# Patient Record
Sex: Female | Born: 1995
Health system: Southern US, Community
[De-identification: ages and names within clinical notes are randomized; demographics above are authoritative.]

## PROBLEM LIST (undated history)

## (undated) ENCOUNTER — Inpatient Hospital Stay (HOSPITAL_COMMUNITY): Payer: Self-pay

## (undated) DIAGNOSIS — F32A Depression, unspecified: Secondary | ICD-10-CM

## (undated) DIAGNOSIS — A749 Chlamydial infection, unspecified: Secondary | ICD-10-CM

## (undated) DIAGNOSIS — Z332 Encounter for elective termination of pregnancy: Secondary | ICD-10-CM

## (undated) DIAGNOSIS — A549 Gonococcal infection, unspecified: Secondary | ICD-10-CM

## (undated) DIAGNOSIS — O139 Gestational [pregnancy-induced] hypertension without significant proteinuria, unspecified trimester: Secondary | ICD-10-CM

## (undated) DIAGNOSIS — F329 Major depressive disorder, single episode, unspecified: Secondary | ICD-10-CM

## (undated) HISTORY — DX: Depression, unspecified: F32.A

## (undated) HISTORY — PX: NO PAST SURGERIES: SHX2092

---
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
## 2013-01-17 ENCOUNTER — Encounter (HOSPITAL_COMMUNITY): Payer: Self-pay | Admitting: Vascular Surgery

## 2013-01-17 ENCOUNTER — Emergency Department (HOSPITAL_COMMUNITY)
Admission: EM | Admit: 2013-01-17 | Discharge: 2013-01-17 | Disposition: A | Discharge status: Home / Self Care | Payer: Medicaid Other | Attending: Emergency Medicine | Admitting: Emergency Medicine

## 2013-01-17 DIAGNOSIS — N898 Other specified noninflammatory disorders of vagina: Secondary | ICD-10-CM | POA: Insufficient documentation

## 2013-01-17 DIAGNOSIS — Z7251 High risk heterosexual behavior: Secondary | ICD-10-CM

## 2013-01-17 DIAGNOSIS — R350 Frequency of micturition: Secondary | ICD-10-CM | POA: Insufficient documentation

## 2013-01-17 DIAGNOSIS — R109 Unspecified abdominal pain: Secondary | ICD-10-CM | POA: Insufficient documentation

## 2013-01-17 DIAGNOSIS — N760 Acute vaginitis: Secondary | ICD-10-CM

## 2013-01-17 DIAGNOSIS — Z3202 Encounter for pregnancy test, result negative: Secondary | ICD-10-CM | POA: Insufficient documentation

## 2013-01-17 LAB — WET PREP, GENITAL

## 2013-01-17 LAB — URINALYSIS, ROUTINE W REFLEX MICROSCOPIC
Glucose, UA: NEGATIVE mg/dL
Hgb urine dipstick: NEGATIVE
Ketones, ur: 15 mg/dL — AB
pH: 6 (ref 5.0–8.0)

## 2013-01-17 LAB — URINE MICROSCOPIC-ADD ON

## 2013-01-17 MED ORDER — METRONIDAZOLE 500 MG PO TABS: 500.0000 mg | ORAL_TABLET | Freq: Two times a day (BID) | ORAL | Status: DC

## 2013-01-17 MED ORDER — CEFTRIAXONE SODIUM 250 MG IJ SOLR
250.0000 mg | Freq: Once | INTRAMUSCULAR | Status: AC
  Administered 2013-01-17: 250 mg via INTRAMUSCULAR
  Filled 2013-01-17: qty 250

## 2013-01-17 MED ORDER — LIDOCAINE HCL (PF) 1 % IJ SOLN
INTRAMUSCULAR | Status: AC
  Administered 2013-01-17: 0.9 mL
  Filled 2013-01-17: qty 5

## 2013-01-17 MED ORDER — DOXYCYCLINE HYCLATE 100 MG PO CAPS: 100.0000 mg | ORAL_CAPSULE | Freq: Two times a day (BID) | ORAL | Status: DC

## 2013-01-17 NOTE — ED Notes (Signed)
States had unprotected sex Friday-- now having green discharge. LMP 2/7-- abd cramping for past 6 days

## 2013-01-17 NOTE — ED Provider Notes (Signed)
History     CSN: [id number]  Arrival date & time 01/17/13  1005   First MD Initiated Contact with Patient 01/17/13 1110      Chief Complaint  Patient presents with  . Exposure to STD    (Consider location/radiation/quality/duration/timing/severity/associated sxs/prior treatment) The history is provided by the patient.   patient has had some green vaginal discharge and cramping lower abdominal pain. No fevers. She states she has had some urinary frequency. The discharge began on Friday and she had unprotected sex on Friday. She states she is late on her period. No fevers. No nausea vomiting diarrhea.  Past Medical History  Diagnosis Date  . No pertinent past medical history     Past Surgical History  Procedure Laterality Date  . No past surgeries      Family History  Problem Relation Age of Onset  . Diabetes Maternal Uncle   . Cancer Maternal Grandmother     pancreatic    History  Substance Use Topics  . Smoking status: Never Smoker   . Smokeless tobacco: Never Used  . Alcohol Use: No    OB History   Grav Para Term Preterm Abortions TAB SAB Ect Mult Living   1               Review of Systems  Constitutional: Negative for activity change and appetite change.  Respiratory: Negative for chest tightness and shortness of breath.   Cardiovascular: Negative for chest pain and leg swelling.  Gastrointestinal: Positive for abdominal pain. Negative for nausea, vomiting and diarrhea.  Genitourinary: Positive for frequency and vaginal discharge. Negative for flank pain and vaginal bleeding.  Musculoskeletal: Negative for back pain.  Skin: Negative for rash.  Neurological: Negative for weakness, numbness and headaches.  Psychiatric/Behavioral: Negative for behavioral problems.    Allergies  Review of patient's allergies indicates no known allergies.  Home Medications   Current Outpatient Rx  Name  Route  Sig  Dispense  Refill  . hydrocortisone cream 1 %    Topical   Apply 1 application topically 2 (two) times daily.         Marland Kitchen OVER THE COUNTER MEDICATION   Oral   Take 1 tablet by mouth daily. Allergy medication         . doxycycline (VIBRAMYCIN) 100 MG capsule   Oral   Take 1 capsule (100 mg total) by mouth 2 (two) times daily.   14 capsule   0   . metroNIDAZOLE (FLAGYL) 500 MG tablet   Oral   Take 1 tablet (500 mg total) by mouth 2 (two) times daily.   14 tablet   0     BP 109/63  Pulse 82  Temp(Src) 98.5 F (36.9 C) (Oral)  Resp 16  SpO2 96%  LMP 12/15/2012  Physical Exam  Nursing note and vitals reviewed. Constitutional: She is oriented to person, place, and time. She appears well-developed and well-nourished.  Cardiovascular: Normal rate and regular rhythm.   No murmur heard. Pulmonary/Chest: Effort normal and breath sounds normal. No respiratory distress. She has no wheezes. She has no rales.  Abdominal: Soft. Bowel sounds are normal. She exhibits no distension. There is no tenderness. There is no rebound and no guarding.  Musculoskeletal: Normal range of motion.  Neurological: She is alert and oriented to person, place, and time. No cranial nerve deficit.  Skin: Skin is warm and dry.  Psychiatric: She has a normal mood and affect. Her speech is normal.   pelvic  exam shows no cervical motion tenderness. No adnexal tenderness. Mild white vaginal discharge.  ED Course  Procedures (including critical care time)  Labs Reviewed  WET PREP, GENITAL - Abnormal; Notable for the following:    Clue Cells Wet Prep HPF POC FEW (*)    WBC, Wet Prep HPF POC FEW (*)    All other components within normal limits  GC/CHLAMYDIA PROBE AMP  PREGNANCY, URINE  URINALYSIS, ROUTINE W REFLEX MICROSCOPIC   No results found.   1. Unprotected sex   2. Bacterial vaginosis       MDM  Patient had unprotected sex and is some lower abdominal pain. Wet prep shows mild bacterial vaginosis. She is not pregnant. She be treated for STDs  and beefy be discharged home.        Juliet Rude. Rubin Payor, MD 01/17/13 1257

## 2013-01-18 LAB — URINE CULTURE: Colony Count: 30000

## 2013-01-19 LAB — GC/CHLAMYDIA PROBE AMP: GC Probe RNA: NEGATIVE

## 2013-01-20 ENCOUNTER — Telehealth (HOSPITAL_COMMUNITY): Payer: Self-pay | Admitting: Emergency Medicine

## 2013-01-20 NOTE — ED Notes (Signed)
+  Chlamydia. Patient treated with Doxycycline and Rocephin. Per protocol MD. [facility] faxed.

## 2013-01-20 NOTE — ED Notes (Signed)
Patient has +Chlamydia. Checking to see if appropriately treated. °

## 2013-01-21 ENCOUNTER — Telehealth (HOSPITAL_COMMUNITY): Payer: Self-pay | Admitting: Emergency Medicine

## 2013-01-21 NOTE — ED Notes (Signed)
Unable to contact patient via phone. Sent letter. °

## 2013-01-23 ENCOUNTER — Telehealth (HOSPITAL_COMMUNITY): Payer: Self-pay | Admitting: Emergency Medicine

## 2013-02-11 ENCOUNTER — Emergency Department (HOSPITAL_COMMUNITY)
Admission: EM | Admit: 2013-02-11 | Discharge: 2013-02-11 | Disposition: A | Discharge status: Home / Self Care | Payer: Medicaid Other | Attending: Emergency Medicine | Admitting: Emergency Medicine

## 2013-02-11 ENCOUNTER — Encounter (HOSPITAL_COMMUNITY): Payer: Self-pay | Admitting: Physical Medicine and Rehabilitation

## 2013-02-11 DIAGNOSIS — N898 Other specified noninflammatory disorders of vagina: Secondary | ICD-10-CM

## 2013-02-11 DIAGNOSIS — A749 Chlamydial infection, unspecified: Secondary | ICD-10-CM

## 2013-02-11 DIAGNOSIS — L293 Anogenital pruritus, unspecified: Secondary | ICD-10-CM | POA: Insufficient documentation

## 2013-02-11 MED ORDER — AZITHROMYCIN 250 MG PO TABS
1000.0000 mg | ORAL_TABLET | Freq: Once | ORAL | Status: AC
  Administered 2013-02-11: 1000 mg via ORAL
  Filled 2013-02-11: qty 4

## 2013-02-11 MED ORDER — LIDOCAINE HCL (PF) 1 % IJ SOLN
INTRAMUSCULAR | Status: AC
  Administered 2013-02-11: 5 mL
  Filled 2013-02-11: qty 5

## 2013-02-11 MED ORDER — CEFTRIAXONE SODIUM 250 MG IJ SOLR
250.0000 mg | Freq: Once | INTRAMUSCULAR | Status: AC
  Administered 2013-02-11: 250 mg via INTRAMUSCULAR
  Filled 2013-02-11: qty 250

## 2013-02-11 MED ORDER — METRONIDAZOLE 500 MG PO TABS
2000.0000 mg | ORAL_TABLET | Freq: Once | ORAL | Status: AC
  Administered 2013-02-11: 2000 mg via ORAL
  Filled 2013-02-11: qty 4

## 2013-02-11 NOTE — ED Provider Notes (Signed)
History     CSN: [id number]  Arrival date & time 02/11/13  1825   First MD Initiated Contact with Patient 02/11/13 1831      Chief Complaint  Patient presents with  . SEXUALLY TRANSMITTED DISEASE    (Consider location/radiation/quality/duration/timing/severity/associated sxs/prior treatment) The history is provided by the patient and medical records. No language interpreter was used.    Terry [name] is a 17 y.o. female  with a hx of STDs presents to the Emergency Department complaining of gradual, persistent, progressively worsening vaginal discharge onset 4 days ago.  Patient states she's been "messing with this same boy" for approximately one year but that he has also been seeing other women. Review shows that the patient was seen on 01/20/2013 J. positive Chlamydia and appears as if she was treated for this. Patient states her symptoms and discharge are the same as when she was seen on the 15th. Associated symptoms include vaginal discharge and itching.  Nothing makes it better and nothing makes it worse.  Pt denies fever, chills, headache, neck pain, chest pain, shortness of breath, abdominal pain, nausea, vomiting, diarrhea, weakness, dizziness, syncope, dysuria, hematuria.  LMP was last week and normal; pt does not use birth control.   Past Medical History  Diagnosis Date  . No pertinent past medical history     Past Surgical History  Procedure Laterality Date  . No past surgeries      Family History  Problem Relation Age of Onset  . Diabetes Maternal Uncle   . Cancer Maternal Grandmother     pancreatic    History  Substance Use Topics  . Smoking status: Never Smoker   . Smokeless tobacco: Never Used  . Alcohol Use: No    OB History   Grav Para Term Preterm Abortions TAB SAB Ect Mult Living   1               Review of Systems  Constitutional: Negative for fever, diaphoresis, appetite change, fatigue and unexpected weight change.  HENT: Negative for mouth  sores and neck stiffness.   Eyes: Negative for visual disturbance.  Respiratory: Negative for cough, chest tightness, shortness of breath and wheezing.   Cardiovascular: Negative for chest pain.  Gastrointestinal: Negative for nausea, vomiting, abdominal pain, diarrhea and constipation.  Endocrine: Negative for polydipsia, polyphagia and polyuria.  Genitourinary: Positive for vaginal discharge. Negative for dysuria, urgency, frequency and hematuria.  Musculoskeletal: Negative for back pain.  Skin: Negative for rash.  Allergic/Immunologic: Negative for immunocompromised state.  Neurological: Negative for syncope, light-headedness and headaches.  Hematological: Does not bruise/bleed easily.  Psychiatric/Behavioral: Negative for sleep disturbance. The patient is not nervous/anxious.     Allergies  Review of patient's allergies indicates no known allergies.  Home Medications  No current outpatient prescriptions on file.  BP 113/75  Pulse 70  Temp(Src) 98.1 F (36.7 C) (Oral)  Resp 16  SpO2 100%  LMP 12/15/2012  Physical Exam  Nursing note and vitals reviewed. Constitutional: She is oriented to person, place, and time. She appears well-developed and well-nourished. No distress.  HENT:  Head: Normocephalic and atraumatic.  Mouth/Throat: Oropharynx is clear and moist. No oropharyngeal exudate.  Eyes: Conjunctivae are normal. No scleral icterus.  Neck: Normal range of motion. Neck supple.  Cardiovascular: Normal rate, regular rhythm, normal heart sounds and intact distal pulses.  Exam reveals no gallop and no friction rub.   No murmur heard. Pulmonary/Chest: Effort normal and breath sounds normal. No respiratory distress. She has no  wheezes. She has no rales. She exhibits no tenderness.  Abdominal: Soft. Normal appearance and bowel sounds are normal. She exhibits no distension and no mass. There is no tenderness. There is no rigidity, no rebound, no guarding, no CVA tenderness, no  tenderness at McBurney's point and negative Murphy's sign.  Musculoskeletal: Normal range of motion. She exhibits no edema and no tenderness.  Lymphadenopathy:    She has no cervical adenopathy.  Neurological: She is alert and oriented to person, place, and time. She exhibits normal muscle tone. Coordination normal.  Speech is clear and goal oriented Moves extremities without ataxia  Skin: Skin is warm and dry. No rash noted. She is not diaphoretic. No erythema.  Psychiatric: She has a normal mood and affect.    ED Course  Procedures (including critical care time)  Labs Reviewed - No data to display No results found.   1. Chlamydia   2. Vaginal discharge       MDM  Terry [name] presents with vaginal itching and discharge, symptoms similar to the last time. This record review it appears the patient was positive for Chlamydia and untreated. Will treat patient prophylactically today.  I do not believe a repeat pelvic exam will be beneficial as none of her symptoms have changed and she was not fully treated last time.  Patient to be discharged with instructions to follow up with OBGYN. Discussed importance of using protection when sexually active. Pt understands that she was positive for Chlamydia and she will need to inform all sexual partners. Pt has been treated prophylacticly with azithromycin and rocephin due to pts history and record review. Pt not concerning for PID because hemodynamically stable, afebrile, non-tachycardic and without abdominal pain. Pt has also been treated with flagyl for Bacterial Vaginosis. Pt has been advised to not drink alcohol while on this medication.           Dahlia Client Maansi Wike, PA-C 02/11/13 1859

## 2013-02-11 NOTE — ED Notes (Signed)
Last sexual encounter was last Friday

## 2013-02-11 NOTE — ED Notes (Signed)
Pt presents to department for evaluation of vaginal itching and possible STD exposure. Pt states vaginal itching since Friday. States recent unprotected sex, thinks partner could have STD. Pt is conscious alert and oriented x4.

## 2013-02-12 NOTE — ED Provider Notes (Signed)
Medical screening examination/treatment/procedure(s) were performed by non-physician practitioner and as supervising physician I was immediately available for consultation/collaboration.    Vida Roller, MD 02/12/13 [phone]

## 2013-03-15 ENCOUNTER — Emergency Department (HOSPITAL_COMMUNITY)
Admission: EM | Admit: 2013-03-15 | Discharge: 2013-03-15 | Disposition: A | Discharge status: Home / Self Care | Payer: Medicaid Other | Attending: Emergency Medicine | Admitting: Emergency Medicine

## 2013-03-15 ENCOUNTER — Emergency Department (HOSPITAL_COMMUNITY): Payer: Medicaid Other

## 2013-03-15 ENCOUNTER — Encounter (HOSPITAL_COMMUNITY): Payer: Self-pay | Admitting: *Deleted

## 2013-03-15 DIAGNOSIS — R519 Headache, unspecified: Secondary | ICD-10-CM

## 2013-03-15 DIAGNOSIS — S0993XA Unspecified injury of face, initial encounter: Secondary | ICD-10-CM | POA: Insufficient documentation

## 2013-03-15 DIAGNOSIS — S0990XA Unspecified injury of head, initial encounter: Secondary | ICD-10-CM | POA: Insufficient documentation

## 2013-03-15 DIAGNOSIS — S199XXA Unspecified injury of neck, initial encounter: Secondary | ICD-10-CM | POA: Insufficient documentation

## 2013-03-15 MED ORDER — IBUPROFEN 800 MG PO TABS: 800.0000 mg | ORAL_TABLET | Freq: Three times a day (TID) | ORAL | Status: DC
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
## 2013-03-15 NOTE — ED Notes (Signed)
PA at bedside assessing patient.

## 2013-03-15 NOTE — ED Notes (Signed)
Patient transported to CT 
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
## 2013-03-17 NOTE — ED Provider Notes (Signed)
  Medical screening examination/treatment/procedure(s) were performed by non-physician practitioner and as supervising physician I was immediately available for consultation/collaboration.    Anothy Bufano, MD 03/17/13 2338 

## 2013-06-13 ENCOUNTER — Encounter (HOSPITAL_COMMUNITY): Payer: Self-pay | Admitting: Emergency Medicine

## 2013-06-13 ENCOUNTER — Emergency Department (HOSPITAL_COMMUNITY)
Admission: EM | Admit: 2013-06-13 | Discharge: 2013-06-13 | Disposition: A | Discharge status: Home / Self Care | Payer: Medicaid Other | Attending: Emergency Medicine | Admitting: Emergency Medicine

## 2013-06-13 DIAGNOSIS — R109 Unspecified abdominal pain: Secondary | ICD-10-CM | POA: Insufficient documentation

## 2013-06-13 DIAGNOSIS — Z3202 Encounter for pregnancy test, result negative: Secondary | ICD-10-CM | POA: Insufficient documentation

## 2013-06-13 DIAGNOSIS — N760 Acute vaginitis: Secondary | ICD-10-CM | POA: Insufficient documentation

## 2013-06-13 LAB — URINALYSIS, ROUTINE W REFLEX MICROSCOPIC
Hgb urine dipstick: NEGATIVE
Ketones, ur: NEGATIVE mg/dL
Leukocytes, UA: NEGATIVE
Protein, ur: NEGATIVE mg/dL
Urobilinogen, UA: 1 mg/dL (ref 0.0–1.0)

## 2013-06-13 LAB — POCT PREGNANCY, URINE: Preg Test, Ur: NEGATIVE

## 2013-06-13 LAB — WET PREP, GENITAL: Trich, Wet Prep: NONE SEEN

## 2013-06-13 MED ORDER — METRONIDAZOLE 500 MG PO TABS: 500.0000 mg | ORAL_TABLET | Freq: Two times a day (BID) | ORAL | Status: AC

## 2013-06-13 NOTE — ED Provider Notes (Signed)
CSN: [id number]     Arrival date & time 06/13/13  1515 History     First MD Initiated Contact with Patient 06/13/13 1728     Chief Complaint  Patient presents with  . Abdominal Pain  . Vaginal Discharge   (Consider location/radiation/quality/duration/timing/severity/associated sxs/prior Treatment) Patient is a 17 y.o. female presenting with abdominal pain and vaginal discharge. The history is provided by the patient.  Abdominal Pain Pain location:  Suprapubic Pain quality: aching   Pain radiates to:  Does not radiate Pain severity:  Mild Onset quality:  Gradual Duration:  3 days Timing:  Constant Progression:  Unchanged Chronicity:  New Context: not awakening from sleep, not recent illness and not trauma   Relieved by:  None tried Worsened by:  Nothing tried Ineffective treatments:  None tried Associated symptoms: vaginal discharge   Associated symptoms: no anorexia, no chest pain, no chills, no constipation, no cough, no diarrhea, no dysuria, no fatigue, no fever, no hematochezia, no hematuria, no melena, no nausea, no shortness of breath, no vaginal bleeding and no vomiting   Vaginal discharge:    Quality:  Malodorous, yellow and thick   Onset quality:  Gradual   Duration:  3 days   Timing:  Constant   Progression:  Unchanged   Chronicity:  New Vaginal Discharge Associated symptoms: abdominal pain   Associated symptoms: no dysuria, no fever, no nausea and no vomiting     Past Medical History  Diagnosis Date  . No pertinent past medical history    Past Surgical History  Procedure Laterality Date  . No past surgeries     Family History  Problem Relation Age of Onset  . Diabetes Maternal Uncle   . Cancer Maternal Grandmother     pancreatic   History  Substance Use Topics  . Smoking status: Never Smoker   . Smokeless tobacco: Never Used  . Alcohol Use: No   OB History   Grav Para Term Preterm Abortions TAB SAB Ect Mult Living   1              Review of  Systems  Constitutional: Negative for fever, chills, diaphoresis, appetite change and fatigue.  Eyes: Negative for photophobia and visual disturbance.  Respiratory: Negative for cough, chest tightness, shortness of breath and wheezing.   Cardiovascular: Negative for chest pain.  Gastrointestinal: Positive for abdominal pain. Negative for nausea, vomiting, diarrhea, constipation, melena, hematochezia, abdominal distention and anorexia.  Genitourinary: Positive for vaginal discharge. Negative for dysuria, urgency, hematuria, flank pain, decreased urine volume, vaginal bleeding, difficulty urinating, vaginal pain and pelvic pain.  Skin: Negative for color change, pallor and rash.  Neurological: Negative for dizziness, syncope, weakness, light-headedness, numbness and headaches.  All other systems reviewed and are negative.    Allergies  Review of patient's allergies indicates no known allergies.  Home Medications   Current Outpatient Rx  Name  Route  Sig  Dispense  Refill  . metroNIDAZOLE (FLAGYL) 500 MG tablet   Oral   Take 1 tablet (500 mg total) by mouth 2 (two) times daily.   14 tablet   0    BP 108/74  Pulse 65  Temp(Src) 98.4 F (36.9 C) (Oral)  Resp 20  SpO2 100% Physical Exam  Nursing note and vitals reviewed. Constitutional: She is oriented to person, place, and time. She appears well-developed and well-nourished. No distress.  HENT:  Head: Normocephalic and atraumatic.  Eyes: EOM are normal. Pupils are equal, round, and reactive to light.  Neck: Normal range of motion. Neck supple.  Cardiovascular: Normal rate, normal heart sounds and intact distal pulses.   Pulmonary/Chest: Effort normal and breath sounds normal. No respiratory distress. She has no wheezes. She has no rales.  Abdominal: Soft. Normal appearance and bowel sounds are normal. She exhibits no distension and no mass. There is tenderness in the suprapubic area. There is no rigidity, no rebound, no guarding,  no tenderness at McBurney's point and negative Murphy's sign.  Genitourinary: There is no rash, tenderness, lesion or injury on the right labia. There is no rash, tenderness, lesion or injury on the left labia. Uterus is not enlarged and not tender. Cervix exhibits no motion tenderness, no discharge and no friability. Right adnexum displays no mass, no tenderness and no fullness. Left adnexum displays no mass, no tenderness and no fullness. No erythema, tenderness or bleeding around the vagina. No signs of injury around the vagina. Vaginal discharge found.  Musculoskeletal: Normal range of motion. She exhibits no edema and no tenderness.  Neurological: She is alert and oriented to person, place, and time. She exhibits normal muscle tone. Coordination normal.  Skin: Skin is warm and dry. She is not diaphoretic.    ED Course   Procedures (including critical care time)  Labs Reviewed  WET PREP, GENITAL - Abnormal; Notable for the following:    Clue Cells Wet Prep HPF POC MANY (*)    WBC, Wet Prep HPF POC FEW (*)    All other components within normal limits  URINALYSIS, ROUTINE W REFLEX MICROSCOPIC - Abnormal; Notable for the following:    APPearance CLOUDY (*)    All other components within normal limits  GC/CHLAMYDIA PROBE AMP  POCT PREGNANCY, URINE   No results found. 1. BV (bacterial vaginosis)     MDM  16 y.o. F presenting with vaginal discharge and suprapubic abdominal pain.  Symptoms began 3 days ago. Pt reports yellow-brown discharge with foul odor.  Pt reports LMP 1 week ago, she has had normal monthly menstrual cycle.  Last sexual encounter 1 month ago, pt denies using protection.  She has prior h/o gonorrhea and recently treated for BV.  Pt stable on arrival, in NAD.  Exam with mild suprapubic TTP, no rebound or guarding.  Pelvic exam with thick white vaginal discharge;  No CMT, cervix normal in appearance.  No ovarian tenderness or fullness.  Wet prep and GC/chlamydia obtained  with U/A and UPT.  Wet prep positive for clue cells.  Exam not consistent with PID. U/a negative for UTI, UPT negative. Pt prescribed flagyl, advised f/u with PCP.  No further questions, stable at discharge.  Discussed with attending Dr. Micheline Maze.  Jodean Lima, MD 06/14/13 [phone]

## 2013-06-13 NOTE — ED Notes (Signed)
Pt in gown. Pelvic set up at bedside.

## 2013-06-13 NOTE — ED Notes (Signed)
Pt c/o LLQ pain and vaginal odor x 4 days; pt sts LMP was 4 days ago

## 2013-06-14 NOTE — ED Provider Notes (Signed)
Medical screening examination/treatment/procedure(s) were conducted as a shared visit with resident-physician practitioner(s) and myself.  I personally evaluated the patient during the encounter.  Pt is a 17 y.o. female with pmhx as above presenting with mild suprapubic pain & vaginal d/c.  Pt well-appearing on exam with benign abdomen.  Pt found to have BV to be tx'd as outpt.  GC/Chlam pending.    Shanna Cisco, MD 06/14/13 1308

## 2013-08-02 ENCOUNTER — Emergency Department (HOSPITAL_COMMUNITY)
Admission: EM | Admit: 2013-08-02 | Discharge: 2013-08-02 | Disposition: A | Discharge status: Home / Self Care | Payer: Medicaid Other | Attending: Emergency Medicine | Admitting: Emergency Medicine

## 2013-08-02 ENCOUNTER — Encounter (HOSPITAL_COMMUNITY): Payer: Self-pay | Admitting: Emergency Medicine

## 2013-08-02 DIAGNOSIS — N760 Acute vaginitis: Secondary | ICD-10-CM | POA: Insufficient documentation

## 2013-08-02 DIAGNOSIS — B9689 Other specified bacterial agents as the cause of diseases classified elsewhere: Secondary | ICD-10-CM

## 2013-08-02 DIAGNOSIS — Z3202 Encounter for pregnancy test, result negative: Secondary | ICD-10-CM | POA: Insufficient documentation

## 2013-08-02 DIAGNOSIS — N390 Urinary tract infection, site not specified: Secondary | ICD-10-CM | POA: Insufficient documentation

## 2013-08-02 DIAGNOSIS — N949 Unspecified condition associated with female genital organs and menstrual cycle: Secondary | ICD-10-CM | POA: Insufficient documentation

## 2013-08-02 LAB — WET PREP, GENITAL
Clue Cells Wet Prep HPF POC: NONE SEEN
Trich, Wet Prep: NONE SEEN
WBC, Wet Prep HPF POC: NONE SEEN
Yeast Wet Prep HPF POC: NONE SEEN

## 2013-08-02 LAB — POCT PREGNANCY, URINE: Preg Test, Ur: NEGATIVE

## 2013-08-02 LAB — URINALYSIS, ROUTINE W REFLEX MICROSCOPIC
Glucose, UA: NEGATIVE mg/dL
Ketones, ur: NEGATIVE mg/dL
Nitrite: NEGATIVE
Protein, ur: 100 mg/dL — AB

## 2013-08-02 LAB — URINE MICROSCOPIC-ADD ON

## 2013-08-02 MED ORDER — CEPHALEXIN 500 MG PO CAPS: 500.0000 mg | ORAL_CAPSULE | Freq: Four times a day (QID) | ORAL | Status: DC

## 2013-08-02 MED ORDER — METRONIDAZOLE 500 MG PO TABS: 500.0000 mg | ORAL_TABLET | Freq: Two times a day (BID) | ORAL | Status: DC

## 2013-08-02 NOTE — ED Notes (Signed)
Pt c/o UTI sx with dysuria; pt sts clear vaginal discharge

## 2013-08-02 NOTE — ED Provider Notes (Signed)
CSN: [id number]     Arrival date & time 08/02/13  1415 History   First MD Initiated Contact with Patient 08/02/13 1608     Chief Complaint  Patient presents with  . Vaginal Discharge  . Dysuria   (Consider location/radiation/quality/duration/timing/severity/associated sxs/prior Treatment) HPI Comments: Patient is a 17 year old female who presents with a 2 day history of vaginal discharge and dysuria. Symptoms started gradually and progressively worsened since the onset. Patient reports white vaginal discharge which seems like BV that she was previously diagnosed with. Patient also reports associated dysuria. No aggravating/alleviating factors. No other associated symptoms.   Patient is a 17 y.o. female presenting with vaginal discharge and dysuria.  Vaginal Discharge Associated symptoms: dysuria   Dysuria Associated symptoms: vaginal discharge     Past Medical History  Diagnosis Date  . No pertinent past medical history    Past Surgical History  Procedure Laterality Date  . No past surgeries     Family History  Problem Relation Age of Onset  . Diabetes Maternal Uncle   . Cancer Maternal Grandmother     pancreatic   History  Substance Use Topics  . Smoking status: Never Smoker   . Smokeless tobacco: Never Used  . Alcohol Use: No   OB History   Grav Para Term Preterm Abortions TAB SAB Ect Mult Living   1              Review of Systems  Genitourinary: Positive for dysuria and vaginal discharge.  All other systems reviewed and are negative.    Allergies  Review of patient's allergies indicates no known allergies.  Home Medications   Current Outpatient Rx  Name  Route  Sig  Dispense  Refill  . ibuprofen (ADVIL,MOTRIN) 200 MG tablet   Oral   Take 200 mg by mouth every 6 (six) hours as needed for pain.          BP 133/87  Pulse 123  Temp(Src) 98.2 F (36.8 C) (Oral)  Resp 18  SpO2 99% Physical Exam  Nursing note and vitals reviewed. Constitutional: She  is oriented to person, place, and time. She appears well-developed and well-nourished. No distress.  HENT:  Head: Normocephalic and atraumatic.  Eyes: Conjunctivae are normal.  Neck: Normal range of motion.  Cardiovascular: Normal rate and regular rhythm.  Exam reveals no gallop and no friction rub.   No murmur heard. Pulmonary/Chest: Effort normal and breath sounds normal. She has no wheezes. She has no rales. She exhibits no tenderness.  Abdominal: Soft. She exhibits no distension. There is no tenderness. There is no rebound and no guarding.  Genitourinary: Vagina normal.  Normal external genitalia. Copious white, thick vaginal discharge with fishy odor. No CMT. Cervical os closed. No tenderness to palpation on bimanual exam.   Musculoskeletal: Normal range of motion.  Neurological: She is alert and oriented to person, place, and time. Coordination normal.  Speech is goal-oriented. Moves limbs without ataxia.   Skin: Skin is warm and dry.  Psychiatric: She has a normal mood and affect. Her behavior is normal.    ED Course  Procedures (including critical care time) Labs Review Labs Reviewed  URINALYSIS, ROUTINE W REFLEX MICROSCOPIC - Abnormal; Notable for the following:    APPearance CLOUDY (*)    Protein, ur 100 (*)    Leukocytes, UA SMALL (*)    All other components within normal limits  URINE MICROSCOPIC-ADD ON - Abnormal; Notable for the following:    Squamous Epithelial /  LPF MANY (*)    Bacteria, UA MANY (*)    All other components within normal limits  WET PREP, GENITAL  URINE CULTURE  GC/CHLAMYDIA PROBE AMP  POCT PREGNANCY, URINE   Imaging Review No results found.  MDM   1. UTI (urinary tract infection)   2. BV (bacterial vaginosis)     6:08 PM Wet prep shows no changes. Urinalysis shows UTI. Pregnancy negative. Patient has copious white vaginal discharge with fishy odor. Despite the results of the test, I will treat her for BV based on clinical presentation.  Vitals stable and patient afebrile.     Emilia Beck, PA-C 08/02/13 2014

## 2013-08-03 LAB — GC/CHLAMYDIA PROBE AMP: GC Probe RNA: NEGATIVE

## 2013-08-04 LAB — URINE CULTURE: Colony Count: >=100000

## 2013-08-05 ENCOUNTER — Telehealth (HOSPITAL_COMMUNITY): Payer: Self-pay | Admitting: Emergency Medicine

## 2013-08-05 NOTE — ED Notes (Signed)
Post ED Visit - Positive Culture Follow-up  Culture report reviewed by antimicrobial stewardship pharmacist: []  Wes Dulaney, Pharm.D., BCPS []  Celedonio Miyamoto, Pharm.D., BCPS [x]  Georgina Pillion, Pharm.D., BCPS []  [place], [place].D., BCPS, AAHIVP []  Estella Husk, Pharm.D., BCPS, AAHIVP  Positive urine culture Treated with Keflex, organism sensitive to the same and no further patient follow-up is required at this time.  Kylie A Holland 08/05/2013, 6:22 PM

## 2013-08-09 NOTE — ED Provider Notes (Signed)
Medical screening examination/treatment/procedure(s) were performed by non-physician practitioner and as supervising physician I was immediately available for consultation/collaboration.   Jiayi Lengacher J Stellan Vick, MD 08/09/13 1625 

## 2013-09-12 ENCOUNTER — Inpatient Hospital Stay (HOSPITAL_COMMUNITY)
Admission: AD | Admit: 2013-09-12 | Discharge: 2013-09-12 | Disposition: A | Discharge status: Home / Self Care | Payer: Medicaid Other | Source: Ambulatory Visit | Attending: Obstetrics & Gynecology | Admitting: Obstetrics & Gynecology

## 2013-09-12 ENCOUNTER — Encounter (HOSPITAL_COMMUNITY): Payer: Self-pay | Admitting: *Deleted

## 2013-09-12 DIAGNOSIS — J209 Acute bronchitis, unspecified: Secondary | ICD-10-CM

## 2013-09-12 DIAGNOSIS — B9689 Other specified bacterial agents as the cause of diseases classified elsewhere: Secondary | ICD-10-CM | POA: Insufficient documentation

## 2013-09-12 DIAGNOSIS — R300 Dysuria: Secondary | ICD-10-CM | POA: Insufficient documentation

## 2013-09-12 DIAGNOSIS — N926 Irregular menstruation, unspecified: Secondary | ICD-10-CM | POA: Insufficient documentation

## 2013-09-12 DIAGNOSIS — N760 Acute vaginitis: Secondary | ICD-10-CM | POA: Insufficient documentation

## 2013-09-12 DIAGNOSIS — N949 Unspecified condition associated with female genital organs and menstrual cycle: Secondary | ICD-10-CM | POA: Insufficient documentation

## 2013-09-12 DIAGNOSIS — A499 Bacterial infection, unspecified: Secondary | ICD-10-CM | POA: Insufficient documentation

## 2013-09-12 HISTORY — DX: Gonococcal infection, unspecified: A54.9

## 2013-09-12 LAB — URINALYSIS, ROUTINE W REFLEX MICROSCOPIC
Bilirubin Urine: NEGATIVE
Glucose, UA: NEGATIVE mg/dL
Hgb urine dipstick: NEGATIVE
Ketones, ur: NEGATIVE mg/dL
Leukocytes, UA: NEGATIVE
Nitrite: NEGATIVE
Protein, ur: 30 mg/dL — AB
Urobilinogen, UA: 0.2 mg/dL (ref 0.0–1.0)
pH: 6 (ref 5.0–8.0)

## 2013-09-12 LAB — WET PREP, GENITAL: Trich, Wet Prep: NONE SEEN

## 2013-09-12 LAB — POCT PREGNANCY, URINE: Preg Test, Ur: NEGATIVE

## 2013-09-12 MED ORDER — DEXTROMETHORPHAN HBR 15 MG/5ML PO SYRP: 10.0000 mL | ORAL_SOLUTION | Freq: Four times a day (QID) | ORAL | Status: DC | PRN

## 2013-09-12 MED ORDER — METRONIDAZOLE 500 MG PO TABS: 500.0000 mg | ORAL_TABLET | Freq: Two times a day (BID) | ORAL | Status: DC

## 2013-09-12 MED ORDER — AZITHROMYCIN 1 G PO PACK: 1.0000 g | PACK | Freq: Once | ORAL | Status: DC

## 2013-09-12 NOTE — MAU Provider Note (Signed)
Attestation of Attending Supervision of Advanced Practitioner (PA/CNM/NP): Evaluation and management procedures were performed by the Advanced Practitioner under my supervision and collaboration.  I have reviewed the Advanced Practitioner's note and chart, and I agree with the management and plan.  Kristena Wilhelmi, MD, FACOG Attending Obstetrician & Gynecologist [facility], [facility]  

## 2013-09-12 NOTE — MAU Note (Addendum)
Back pain, nausea, foul smelling vaginal D/C. X 1 week. States Ibuprofen relieves back pain. Took a dose last night around 2000.

## 2013-09-12 NOTE — MAU Provider Note (Signed)
History    Terry [name].is a 17 y.o. G0P0 who presents with complaint of white vaginal discharge and odor, urinary pain / dysuria / sensation of incomplete voiding, nausea, and back pain. She has a history of irregular periods and stopped taking depo provera for contraception about 2 years ago. She would also like a referral to the [facility] for evaluation of this. No tobacco, ETOH, or illicit drug use. CSN: [id number]  Arrival date and time: 09/12/13 1020   None     Chief Complaint  Patient presents with  . Vaginal Discharge   Vaginal Discharge The patient's primary symptoms include a vaginal discharge. Associated symptoms include dysuria, nausea (occasional nausea without vomiting since urinary symptoms started) and urgency (sensation of incomplete emptying). Pertinent negatives include no abdominal pain, chills, constipation, diarrhea, fever, hematuria, joint pain, rash or vomiting.    OB History   Grav Para Term Preterm Abortions TAB SAB Ect Mult Living   0               Past Medical History  Diagnosis Date  . No pertinent past medical history   . Gonorrhea     Past Surgical History  Procedure Laterality Date  . No past surgeries      Family History  Problem Relation Age of Onset  . Diabetes Maternal Uncle   . Cancer Maternal Grandmother     pancreatic    History  Substance Use Topics  . Smoking status: Never Smoker   . Smokeless tobacco: Never Used  . Alcohol Use: No    Allergies: No Known Allergies  No prescriptions prior to admission    Review of Systems  Constitutional: Negative.  Negative for fever, chills, weight loss, malaise/fatigue and diaphoresis.  HENT: Negative.   Eyes: Negative.  Negative for blurred vision, double vision and photophobia.  Respiratory: Negative.  Negative for shortness of breath and wheezing.   Cardiovascular: Negative.  Negative for chest pain and palpitations.  Gastrointestinal: Positive for nausea  (occasional nausea without vomiting since urinary symptoms started). Negative for heartburn, vomiting, abdominal pain, diarrhea, constipation, blood in stool and melena.  Genitourinary: Positive for dysuria, urgency (sensation of incomplete emptying) and vaginal discharge. Negative for hematuria.  Musculoskeletal: Negative.  Negative for joint pain and myalgias.  Skin: Negative.  Negative for itching and rash.  Neurological: Negative.  Negative for dizziness, tingling and weakness.  Endo/Heme/Allergies: Does not bruise/bleed easily.  Psychiatric/Behavioral: Negative.  Negative for depression and substance abuse.   Physical Exam   Blood pressure 121/81, pulse 102, temperature 98.5 F (36.9 C), temperature source Oral, resp. rate 18, last menstrual period 08/13/2013.  Physical Exam  Constitutional: She is oriented to person, place, and time. She appears well-developed and well-nourished. No distress.  HENT:  Head: Normocephalic and atraumatic.  Right Ear: External ear normal.  Left Ear: External ear normal.  Eyes: Right eye exhibits no discharge. Left eye exhibits no discharge.  Neck: No JVD present. No tracheal deviation present.  Cardiovascular: Normal rate, normal heart sounds and intact distal pulses.  Exam reveals no gallop and no friction rub.   No murmur heard. Respiratory: Effort normal and breath sounds normal. No stridor. No respiratory distress. She has no wheezes. She has no rales. She exhibits no tenderness.  GI: Soft. Bowel sounds are normal. She exhibits no distension and no mass. There is no tenderness. There is no rebound and no guarding.  No suprapubic tenderness. Patient "might have felt a little pain" upon percussion  of CVA on R side.  Genitourinary: Uterus normal. Vaginal discharge found.  Normal female anatomy on pelvic / bimanual exam. Cervical OS closed. No adnexal tenderness on bimanual exam. White thick discharge noted in vaginal canal and exterior surface. No  internal or external lesions.  Neurological: She is alert and oriented to person, place, and time.  Skin: Skin is warm and dry. No rash noted. She is not diaphoretic. No erythema. No pallor.  Psychiatric: She has a normal mood and affect. Her behavior is normal. Thought content normal.    MAU Course  Procedures  MDM Urinalysis - not suggestive of UTI Pelvic / Bimanual Exam and Cultures  Assessment and Plan    Jefm Petty 09/12/2013, 11:55 AM   Results for orders placed during the hospital encounter of 09/12/13 (from the past 24 hour(s))  URINALYSIS, ROUTINE W REFLEX MICROSCOPIC     Status: Abnormal   Collection Time    09/12/13 10:50 AM      Result Value Range   Color, Urine YELLOW  YELLOW   APPearance CLEAR  CLEAR   Specific Gravity, Urine 1.025  1.005 - 1.030   pH 6.0  5.0 - 8.0   Glucose, UA NEGATIVE  NEGATIVE mg/dL   Hgb urine dipstick NEGATIVE  NEGATIVE   Bilirubin Urine NEGATIVE  NEGATIVE   Ketones, ur NEGATIVE  NEGATIVE mg/dL   Protein, ur 30 (*) NEGATIVE mg/dL   Urobilinogen, UA 0.2  0.0 - 1.0 mg/dL   Nitrite NEGATIVE  NEGATIVE   Leukocytes, UA NEGATIVE  NEGATIVE  POCT PREGNANCY, URINE     Status: None   Collection Time    09/12/13 10:53 AM      Result Value Range   Preg Test, Ur NEGATIVE  NEGATIVE  WET PREP, GENITAL     Status: Abnormal   Collection Time    09/12/13 12:44 PM      Result Value Range   Yeast Wet Prep HPF POC NONE SEEN  NONE SEEN   Trich, Wet Prep NONE SEEN  NONE SEEN   Clue Cells Wet Prep HPF POC MODERATE (*) NONE SEEN   WBC, Wet Prep HPF POC FEW (*) NONE SEEN    A: Bacterial vaginosis Irregular menses  P: Discharge home Rx for Flagyl sent to patient's pharmacy Discussed hygiene products and probiotics for avoiding recurrence Patient given contact information for [facility] clinic to call and make an appointment for further evaluation of irregular menses Patient may return to MAU as needed or if her condition were to change or worsen  I  have seen and evaluated the patient with the PA student. I agree with the assessment and plan as written above.   Freddi Starr, PA-C 09/12/2013 1:34 PM

## 2013-09-13 LAB — GC/CHLAMYDIA PROBE AMP
CT Probe RNA: NEGATIVE
GC Probe RNA: NEGATIVE

## 2013-10-08 ENCOUNTER — Other Ambulatory Visit (HOSPITAL_COMMUNITY)
Admission: RE | Admit: 2013-10-08 | Discharge: 2013-10-08 | Disposition: A | Discharge status: Home / Self Care | Payer: Medicaid Other | Source: Ambulatory Visit | Attending: Family Medicine | Admitting: Family Medicine

## 2013-10-08 ENCOUNTER — Encounter (HOSPITAL_COMMUNITY): Payer: Self-pay | Admitting: Emergency Medicine

## 2013-10-08 ENCOUNTER — Emergency Department (INDEPENDENT_AMBULATORY_CARE_PROVIDER_SITE_OTHER)
Admission: EM | Admit: 2013-10-08 | Discharge: 2013-10-08 | Disposition: A | Discharge status: Home / Self Care | Payer: Medicaid Other | Source: Home / Self Care | Attending: Family Medicine | Admitting: Family Medicine

## 2013-10-08 DIAGNOSIS — N760 Acute vaginitis: Secondary | ICD-10-CM | POA: Insufficient documentation

## 2013-10-08 DIAGNOSIS — Z113 Encounter for screening for infections with a predominantly sexual mode of transmission: Secondary | ICD-10-CM | POA: Insufficient documentation

## 2013-10-08 NOTE — ED Provider Notes (Signed)
CSN: [id number]     Arrival date & time 10/08/13  1241 History   First MD Initiated Contact with Patient 10/08/13 1407     Chief Complaint  Patient presents with  . Vaginal Discharge   (Consider location/radiation/quality/duration/timing/severity/associated sxs/prior Treatment) Patient is a 17 y.o. female presenting with vaginal discharge. The history is provided by the patient.  Vaginal Discharge Quality:  Malodorous and white Severity:  Mild Onset quality:  Gradual Duration:  5 days Progression:  Unchanged Chronicity:  New Associated symptoms: abdominal pain   Associated symptoms: no dyspareunia, no dysuria, no fever, no genital lesions, no rash, no urinary frequency and no vaginal itching   Risk factors: unprotected sex   Risk factors comment:  H/o BV 17mo ago.   Past Medical History  Diagnosis Date  . No pertinent past medical history   . Gonorrhea    Past Surgical History  Procedure Laterality Date  . No past surgeries     Family History  Problem Relation Age of Onset  . Diabetes Maternal Uncle   . Cancer Maternal Grandmother     pancreatic   History  Substance Use Topics  . Smoking status: Never Smoker   . Smokeless tobacco: Never Used  . Alcohol Use: No   OB History   Grav Para Term Preterm Abortions TAB SAB Ect Mult Living   0              Review of Systems  Constitutional: Negative.  Negative for fever.  Gastrointestinal: Positive for abdominal pain.  Genitourinary: Positive for vaginal discharge. Negative for dysuria, urgency, flank pain, vaginal bleeding, menstrual problem, pelvic pain and dyspareunia.    Allergies  Review of patient's allergies indicates no known allergies.  Home Medications   Current Outpatient Rx  Name  Route  Sig  Dispense  Refill  . metroNIDAZOLE (FLAGYL) 500 MG tablet   Oral   Take 1 tablet (500 mg total) by mouth 2 (two) times daily.   14 tablet   0    BP 103/70  Pulse 68  Temp(Src) 98.5 F (36.9 C) (Oral)  Resp  16  SpO2 99%  LMP 09/12/2013 Physical Exam  Nursing note and vitals reviewed. Constitutional: She is oriented to person, place, and time. She appears well-developed and well-nourished.  Abdominal: Soft. Bowel sounds are normal.  Genitourinary: Uterus normal. There is no rash or tenderness on the right labia. There is no rash or tenderness on the left labia. Cervix exhibits no motion tenderness, no discharge and no friability. Right adnexum displays no mass and no tenderness. Left adnexum displays no mass and no tenderness. No erythema or tenderness around the vagina. Vaginal discharge found.  Neurological: She is alert and oriented to person, place, and time.  Skin: Skin is warm and dry.    ED Course  Procedures (including critical care time) Labs Review Labs Reviewed  CERVICOVAGINAL ANCILLARY ONLY   Imaging Review No results found.  EKG Interpretation    Date/Time:    Ventricular Rate:    PR Interval:    QRS Duration:   QT Interval:    QTC Calculation:   R Axis:     Text Interpretation:              MDM      Linna Hoff, MD 10/08/13 [phone]

## 2013-10-08 NOTE — ED Notes (Signed)
Vaginal discharge and odor for 5 days, associated with lower abdominal pain

## 2013-10-10 NOTE — ED Notes (Addendum)
GC/Chlamydia neg., Affirm: Candida and Trich neg., Gardnerella pos. Message sent to Dr. Artis Flock. Terry [name] 10/10/2013 Dr. Artis Flock said not further treatment necessary.   10/12/2013

## 2013-10-19 ENCOUNTER — Encounter (HOSPITAL_COMMUNITY): Payer: Self-pay | Admitting: *Deleted

## 2013-10-19 ENCOUNTER — Inpatient Hospital Stay (HOSPITAL_COMMUNITY)
Admission: AD | Admit: 2013-10-19 | Discharge: 2013-10-19 | Disposition: A | Discharge status: Home / Self Care | Payer: Medicaid Other | Source: Ambulatory Visit | Attending: Obstetrics & Gynecology | Admitting: Obstetrics & Gynecology

## 2013-10-19 DIAGNOSIS — B9689 Other specified bacterial agents as the cause of diseases classified elsewhere: Secondary | ICD-10-CM | POA: Insufficient documentation

## 2013-10-19 DIAGNOSIS — A499 Bacterial infection, unspecified: Secondary | ICD-10-CM | POA: Insufficient documentation

## 2013-10-19 DIAGNOSIS — R030 Elevated blood-pressure reading, without diagnosis of hypertension: Secondary | ICD-10-CM | POA: Insufficient documentation

## 2013-10-19 DIAGNOSIS — N760 Acute vaginitis: Secondary | ICD-10-CM | POA: Insufficient documentation

## 2013-10-19 LAB — URINALYSIS, ROUTINE W REFLEX MICROSCOPIC
Bilirubin Urine: NEGATIVE
Glucose, UA: NEGATIVE mg/dL
Ketones, ur: NEGATIVE mg/dL
Leukocytes, UA: NEGATIVE
Nitrite: NEGATIVE
Protein, ur: NEGATIVE mg/dL
pH: 6 (ref 5.0–8.0)

## 2013-10-19 LAB — WET PREP, GENITAL: Trich, Wet Prep: NONE SEEN

## 2013-10-19 MED ORDER — METRONIDAZOLE 500 MG PO TABS: 500.0000 mg | ORAL_TABLET | Freq: Two times a day (BID) | ORAL | Status: AC

## 2013-10-19 NOTE — MAU Provider Note (Signed)
CC: Vaginal Discharge    First Provider Initiated Contact with Patient 10/19/13 1556      HPI Terry [name] is a 17 y.o. G0 who presents with onset of malodorous vaginal discharge 1 day ago. Discharge has a fishy odor since she thinks it is BP which she has had twice before. No contraception. LMP 10/12/13. Denies abnormal bleeding  Past Medical History  Diagnosis Date  . No pertinent past medical history   . Gonorrhea   . Medical history non-contributory   Denies hx HTN  OB History  Gravida Para Term Preterm AB SAB TAB Ectopic Multiple Living  0                 Past Surgical History  Procedure Laterality Date  . No past surgeries      History   Social History  . Marital Status: Single    Spouse Name: N/A    Number of Children: N/A  . Years of Education: N/A   Occupational History  . Not on file.   Social History Main Topics  . Smoking status: Never Smoker   . Smokeless tobacco: Never Used  . Alcohol Use: No  . Drug Use: No  . Sexual Activity: Yes    Birth Control/ Protection: None     Comment: 1 partner X 2 years   Other Topics Concern  . Not on file   Social History Narrative  . No narrative on file    No current facility-administered medications on file prior to encounter.   No current outpatient prescriptions on file prior to encounter.    No Known Allergies  ROS Pertinent items in HPI  PHYSICAL EXAM Filed Vitals:   10/19/13 1349  BP: 151/90  Pulse: 105  Temp: 98.7 F (37.1 C)  Resp: 18   General: Well nourished, well developed female in no acute distress Cardiovascular: Normal rate Respiratory: Normal effort Abdomen: Soft, nontender Back: No CVAT Extremities: No edema Neurologic: Alert and oriented Speculum exam: NEFG; vagina with thin bubbly discharge, no blood; cervix clean Bimanual exam: cervix closed, no CMT; uterus NSSP; no adnexal tenderness or masses   LAB RESULTS Results for orders placed during the hospital  encounter of 10/19/13 (from the past 24 hour(s))  URINALYSIS, ROUTINE W REFLEX MICROSCOPIC     Status: None   Collection Time    10/19/13  2:31 PM      Result Value Range   Color, Urine YELLOW  YELLOW   APPearance CLEAR  CLEAR   Specific Gravity, Urine 1.015  1.005 - 1.030   pH 6.0  5.0 - 8.0   Glucose, UA NEGATIVE  NEGATIVE mg/dL   Hgb urine dipstick NEGATIVE  NEGATIVE   Bilirubin Urine NEGATIVE  NEGATIVE   Ketones, ur NEGATIVE  NEGATIVE mg/dL   Protein, ur NEGATIVE  NEGATIVE mg/dL   Urobilinogen, UA 0.2  0.0 - 1.0 mg/dL   Nitrite NEGATIVE  NEGATIVE   Leukocytes, UA NEGATIVE  NEGATIVE  POCT PREGNANCY, URINE     Status: None   Collection Time    10/19/13  2:38 PM      Result Value Range   Preg Test, Ur NEGATIVE  NEGATIVE  WET PREP, GENITAL     Status: Abnormal   Collection Time    10/19/13  4:00 PM      Result Value Range   Yeast Wet Prep HPF POC NONE SEEN  NONE SEEN   Trich, Wet Prep NONE SEEN  NONE SEEN   Clue  Cells Wet Prep HPF POC MODERATE (*) NONE SEEN   WBC, Wet Prep HPF POC FEW (*) NONE SEEN     ASSESSMENT  1. BV (bacterial vaginosis)   2. Elevated BP     PLAN Discharge home. See AVS for patient education. Advised F/U with PMD regarding BP elevation   Medication List         metroNIDAZOLE 500 MG tablet  Commonly known as:  FLAGYL  Take 1 tablet (500 mg total) by mouth 2 (two) times daily.           Danae Orleans, CNM 10/19/2013 4:00 PM

## 2013-10-19 NOTE — MAU Note (Signed)
Pt presents with complaints of a discharge with an odor.  She states that she notices it more after her cycle.

## 2013-12-04 ENCOUNTER — Emergency Department (HOSPITAL_COMMUNITY)
Admission: EM | Admit: 2013-12-04 | Discharge: 2013-12-04 | Disposition: A | Discharge status: Home / Self Care | Payer: Medicaid Other | Attending: Emergency Medicine | Admitting: Emergency Medicine

## 2013-12-04 ENCOUNTER — Encounter (HOSPITAL_COMMUNITY): Payer: Self-pay | Admitting: Emergency Medicine

## 2013-12-04 DIAGNOSIS — Z3202 Encounter for pregnancy test, result negative: Secondary | ICD-10-CM | POA: Insufficient documentation

## 2013-12-04 DIAGNOSIS — N898 Other specified noninflammatory disorders of vagina: Secondary | ICD-10-CM

## 2013-12-04 DIAGNOSIS — Z8619 Personal history of other infectious and parasitic diseases: Secondary | ICD-10-CM | POA: Insufficient documentation

## 2013-12-04 LAB — URINALYSIS, ROUTINE W REFLEX MICROSCOPIC
BILIRUBIN URINE: NEGATIVE
Glucose, UA: NEGATIVE mg/dL
Hgb urine dipstick: NEGATIVE
Ketones, ur: NEGATIVE mg/dL
LEUKOCYTES UA: NEGATIVE
NITRITE: NEGATIVE
Protein, ur: NEGATIVE mg/dL
SPECIFIC GRAVITY, URINE: 1.01 (ref 1.005–1.030)
UROBILINOGEN UA: 0.2 mg/dL (ref 0.0–1.0)
pH: 7 (ref 5.0–8.0)

## 2013-12-04 LAB — WET PREP, GENITAL
Trich, Wet Prep: NONE SEEN
WBC WET PREP: NONE SEEN
Yeast Wet Prep HPF POC: NONE SEEN

## 2013-12-04 LAB — POCT PREGNANCY, URINE: Preg Test, Ur: NEGATIVE

## 2013-12-04 MED ORDER — METRONIDAZOLE 500 MG PO TABS: 500.0000 mg | ORAL_TABLET | Freq: Two times a day (BID) | ORAL | Status: DC

## 2013-12-04 NOTE — ED Notes (Signed)
Pt reports pain to her lower back and abd, vaginal odor, and white/clear vaginal discharge x3 days

## 2013-12-04 NOTE — ED Provider Notes (Signed)
CSN: [id number]     Arrival date & time 12/04/13  1138 History   First MD Initiated Contact with Patient 12/04/13 1634     Chief Complaint  Patient presents with  . Abdominal Pain   (Consider location/radiation/quality/duration/timing/severity/associated sxs/prior Treatment) HPI Comments: 18 yo female with suprapubic pain and white vaginal discharge for three days, similar to BV hx.  No fevers, vomiting or PID hx.  Pt had chlamydia years ago, not currently pregnant.  No bleeding.  Mild back discomfort.     Patient is a 18 y.o. female presenting with abdominal pain. The history is provided by the patient.  Abdominal Pain Associated symptoms: vaginal discharge   Associated symptoms: no chills, no fever, no hematuria, no nausea, no shortness of breath, no vaginal bleeding and no vomiting     Past Medical History  Diagnosis Date  . No pertinent past medical history   . Gonorrhea   . Medical history non-contributory    Past Surgical History  Procedure Laterality Date  . No past surgeries     Family History  Problem Relation Age of Onset  . Diabetes Maternal Uncle   . Cancer Maternal Grandmother     pancreatic   History  Substance Use Topics  . Smoking status: Never Smoker   . Smokeless tobacco: Never Used  . Alcohol Use: No   OB History   Grav Para Term Preterm Abortions TAB SAB Ect Mult Living   0              Review of Systems  Constitutional: Negative for fever and chills.  Respiratory: Negative for shortness of breath.   Gastrointestinal: Positive for abdominal pain. Negative for nausea and vomiting.  Genitourinary: Positive for vaginal discharge. Negative for hematuria, flank pain, vaginal bleeding and vaginal pain.    Allergies  Review of patient's allergies indicates no known allergies.  Home Medications   Current Outpatient Rx  Name  Route  Sig  Dispense  Refill  . metroNIDAZOLE (FLAGYL) 500 MG tablet   Oral   Take 1 tablet (500 mg total) by mouth 2  (two) times daily.   14 tablet   0    BP 131/69  Pulse 85  Temp(Src) 98.7 F (37.1 C) (Oral)  Resp 16  SpO2 100%  LMP 11/17/2013 Physical Exam  Nursing note and vitals reviewed. Constitutional: She is oriented to person, place, and time. She appears well-developed and well-nourished.  HENT:  Head: Normocephalic and atraumatic.  Eyes: Conjunctivae are normal. Right eye exhibits no discharge. Left eye exhibits no discharge.  Neck: Normal range of motion. Neck supple. No tracheal deviation present.  Cardiovascular: Normal rate.   Pulmonary/Chest: Effort normal.  Abdominal: Soft. She exhibits no distension. There is no tenderness. There is no guarding.  Genitourinary:  Mild white vaginal dc, no cmt or adnexal pain, no bleeding  Musculoskeletal: She exhibits no edema.  Neurological: She is alert and oriented to person, place, and time.  Skin: Skin is warm. No rash noted.  Psychiatric: She has a normal mood and affect.    ED Course  Procedures (including critical care time) Labs Review Labs Reviewed  URINALYSIS, ROUTINE W REFLEX MICROSCOPIC - Abnormal; Notable for the following:    APPearance HAZY (*)    All other components within normal limits  GC/CHLAMYDIA PROBE AMP  WET PREP, GENITAL  POCT PREGNANCY, URINE   Imaging Review No results found.  EKG Interpretation   None       MDM  1. Vaginal discharge    Well appearing. No pain on exam.  Low suspicion PID.  Pt will fup for culture results, treating empirically for BV with odor, dc and similar hx.  Results and differential diagnosis were discussed with the patient. Close follow up outpatient was discussed, patient comfortable with the plan.   Diagnosis: Vaginal dc, BV    Enid SkeensJoshua M Erlene Devita, MD 12/04/13 1742

## 2013-12-04 NOTE — ED Notes (Signed)
MD at bedside. 

## 2013-12-04 NOTE — Discharge Instructions (Signed)
If you were given medicines take as directed.  If you are on coumadin or contraceptives realize their levels and effectiveness is altered by many different medicines.  If you have any reaction (rash, tongues swelling, other) to the medicines stop taking and see a physician.   Please follow up as directed and return to the ER or see a physician for new or worsening symptoms.  Thank you. Follow up culture results with OBGYN.

## 2013-12-05 LAB — GC/CHLAMYDIA PROBE AMP
CT Probe RNA: NEGATIVE
GC PROBE AMP APTIMA: NEGATIVE

## 2014-01-12 ENCOUNTER — Encounter (HOSPITAL_COMMUNITY): Payer: Self-pay

## 2014-01-12 ENCOUNTER — Inpatient Hospital Stay (HOSPITAL_COMMUNITY)
Admission: AD | Admit: 2014-01-12 | Discharge: 2014-01-12 | Disposition: A | Discharge status: Home / Self Care | Payer: Medicaid Other | Source: Ambulatory Visit | Attending: Obstetrics & Gynecology | Admitting: Obstetrics & Gynecology

## 2014-01-12 DIAGNOSIS — N949 Unspecified condition associated with female genital organs and menstrual cycle: Secondary | ICD-10-CM | POA: Insufficient documentation

## 2014-01-12 DIAGNOSIS — N898 Other specified noninflammatory disorders of vagina: Secondary | ICD-10-CM

## 2014-01-12 DIAGNOSIS — L293 Anogenital pruritus, unspecified: Secondary | ICD-10-CM | POA: Insufficient documentation

## 2014-01-12 LAB — URINALYSIS, ROUTINE W REFLEX MICROSCOPIC
Bilirubin Urine: NEGATIVE
Glucose, UA: NEGATIVE mg/dL
Hgb urine dipstick: NEGATIVE
Ketones, ur: NEGATIVE mg/dL
LEUKOCYTES UA: NEGATIVE
NITRITE: NEGATIVE
PH: 6 (ref 5.0–8.0)
Protein, ur: NEGATIVE mg/dL
SPECIFIC GRAVITY, URINE: 1.01 (ref 1.005–1.030)
Urobilinogen, UA: 0.2 mg/dL (ref 0.0–1.0)

## 2014-01-12 LAB — POCT PREGNANCY, URINE: PREG TEST UR: NEGATIVE

## 2014-01-12 LAB — WET PREP, GENITAL
CLUE CELLS WET PREP: NONE SEEN
Trich, Wet Prep: NONE SEEN
Yeast Wet Prep HPF POC: NONE SEEN

## 2014-01-12 MED ORDER — FLUCONAZOLE 150 MG PO TABS
150.0000 mg | ORAL_TABLET | Freq: Once | ORAL | Status: AC
  Administered 2014-01-12: 150 mg via ORAL
  Filled 2014-01-12: qty 1

## 2014-01-12 NOTE — Discharge Instructions (Signed)
Candidal Vulvovaginitis  Candidal vulvovaginitis is an infection of the vagina and vulva. The vulva is the skin around the opening of the vagina. This may cause itching and discomfort in and around the vagina.   HOME CARE  · Only take medicine as told by your doctor.  · Do not have sex (intercourse) until the infection is healed or as told by your doctor.  · Practice safe sex.  · Tell your sex partner about your infection.  · Do not douche or use tampons.  · Wear cotton underwear. Do not wear tight pants or panty hose.  · Eat yogurt. This may help treat and prevent yeast infections.  GET HELP RIGHT AWAY IF:   · You have a fever.  · Your problems get worse during treatment or do not get better in 3 days.  · You have discomfort, irritation, or itching in your vagina or vulva area.  · You have pain after sex.  · You start to get belly (abdominal) pain.  MAKE SURE YOU:  · Understand these instructions.  · Will watch your condition.  · Will get help right away if you are not doing well or get worse.  Document Released: 01/21/2009 Document Revised: 01/17/2012 Document Reviewed: 01/21/2009  [employer]® Patient Information ©2014 [employer].

## 2014-01-12 NOTE — MAU Provider Note (Signed)
  History     CSN: [id number]  Arrival date and time: 01/12/14 1224   First Provider Initiated Contact with Patient 01/12/14 1452      Chief Complaint  Patient presents with  . Vaginal itching    HPI  [name] is a 18 yo G0 who presents for vaginal itching  - having days where it is itching in her vaginal area - seems to come and go if she bathes more.  - also having some intermittent burning - no worsening discharge  - Sexually active with one partner- only intermittently using condoms.   - no fevers, chills, nausea, vomiting, diarrhea, constipation, abd pain.    - hx of gonorrhea 2013   Past Medical History  Diagnosis Date  . No pertinent past medical history   . Gonorrhea   . Medical history non-contributory     Past Surgical History  Procedure Laterality Date  . No past surgeries      Family History  Problem Relation Age of Onset  . Diabetes Maternal Uncle   . Cancer Maternal Grandmother     pancreatic    History  Substance Use Topics  . Smoking status: Never Smoker   . Smokeless tobacco: Never Used  . Alcohol Use: No    Allergies: No Known Allergies  No prescriptions prior to admission    Review of Systems  All other systems reviewed and are negative.   Physical Exam   Blood pressure 125/71, pulse 82, temperature 97.2 F (36.2 C), temperature source Oral, resp. rate 18, height 5\' 9"  (1.753 m), weight 67.756 kg (149 lb 6 oz), last menstrual period 12/22/2013.  Physical Exam  Constitutional: She is oriented to person, place, and time. She appears well-developed and well-nourished.  HENT:  Head: Normocephalic and atraumatic.  Cardiovascular: Normal rate, regular rhythm and normal heart sounds.   Respiratory: Effort normal and breath sounds normal.  GI: Soft. Bowel sounds are normal. She exhibits no distension and no mass. There is no tenderness. There is no rebound and no guarding.  Genitourinary: Uterus normal. Cervix exhibits no motion  tenderness, no discharge and no friability. Right adnexum displays no mass and no tenderness. Left adnexum displays no mass and no tenderness. There is erythema (some irritation in the labia and vagina) around the vagina. No tenderness or bleeding around the vagina. No signs of injury around the vagina. Vaginal discharge (thick curdish discharge in the vault) found.  Musculoskeletal: Normal range of motion.  Neurological: She is alert and oriented to person, place, and time.  Skin: Skin is warm and dry.    MAU Course  Procedures  MDM Gc/chl Wet prep  Assessment and Plan  18 yo G0 here for vaginal itching and burning  - wet prep neg for yeast but given symptoms and exam most consistent with yeast, will treat  - diflucan 150mg  oto given here - f/u gc/chl results - discussed need to use protection regularly - recommend she find a PCP as well  D/c to home with return precautions.   Terry [name] L 01/12/2014, 2:54 PM

## 2014-01-12 NOTE — MAU Note (Signed)
Pt requests to be called at [phone]

## 2014-01-12 NOTE — MAU Note (Signed)
Pt states here for eval of possible STI. Has vaginal itching only. Denies pain, bleeding, or abnormal vaginal discharge. Has not been told she was exposed, however wants to be sure.

## 2014-01-14 LAB — GC/CHLAMYDIA PROBE AMP
CT PROBE, AMP APTIMA: NEGATIVE
GC PROBE AMP APTIMA: NEGATIVE

## 2014-01-21 ENCOUNTER — Emergency Department (HOSPITAL_COMMUNITY)
Admission: EM | Admit: 2014-01-21 | Discharge: 2014-01-21 | Disposition: A | Discharge status: Home / Self Care | Payer: Medicaid Other | Attending: Emergency Medicine | Admitting: Emergency Medicine

## 2014-01-21 DIAGNOSIS — A499 Bacterial infection, unspecified: Secondary | ICD-10-CM | POA: Insufficient documentation

## 2014-01-21 DIAGNOSIS — N760 Acute vaginitis: Secondary | ICD-10-CM | POA: Insufficient documentation

## 2014-01-21 DIAGNOSIS — B9689 Other specified bacterial agents as the cause of diseases classified elsewhere: Secondary | ICD-10-CM | POA: Insufficient documentation

## 2014-01-21 DIAGNOSIS — N949 Unspecified condition associated with female genital organs and menstrual cycle: Secondary | ICD-10-CM

## 2014-01-21 DIAGNOSIS — N898 Other specified noninflammatory disorders of vagina: Secondary | ICD-10-CM | POA: Insufficient documentation

## 2014-01-21 DIAGNOSIS — Z3202 Encounter for pregnancy test, result negative: Secondary | ICD-10-CM | POA: Insufficient documentation

## 2014-01-21 LAB — URINALYSIS, ROUTINE W REFLEX MICROSCOPIC
BILIRUBIN URINE: NEGATIVE
GLUCOSE, UA: NEGATIVE mg/dL
HGB URINE DIPSTICK: NEGATIVE
Ketones, ur: NEGATIVE mg/dL
Leukocytes, UA: NEGATIVE
Nitrite: NEGATIVE
PH: 5.5 (ref 5.0–8.0)
Protein, ur: NEGATIVE mg/dL
SPECIFIC GRAVITY, URINE: 1.025 (ref 1.005–1.030)
Urobilinogen, UA: 0.2 mg/dL (ref 0.0–1.0)

## 2014-01-21 LAB — WET PREP, GENITAL
TRICH WET PREP: NONE SEEN
YEAST WET PREP: NONE SEEN

## 2014-01-21 LAB — POC URINE PREG, ED: PREG TEST UR: NEGATIVE

## 2014-01-21 MED ORDER — VALACYCLOVIR HCL 1 G PO TABS: 1000.0000 mg | ORAL_TABLET | Freq: Three times a day (TID) | ORAL | Status: DC

## 2014-01-21 MED ORDER — METRONIDAZOLE 500 MG PO TABS: 500.0000 mg | ORAL_TABLET | Freq: Two times a day (BID) | ORAL | Status: DC

## 2014-01-21 NOTE — Progress Notes (Signed)
   CARE MANAGEMENT ED NOTE 01/21/2014  Patient:  Terry [name],Terry [name]   Account Number:  [id number]  Date Initiated:  01/21/2014  Documentation initiated by:  Edd ArbourGIBBS,KIMBERLY  Subjective/Objective Assessment:   18 yr old female medicaid [place] pt (confirmed via e medicaid) [place] with 4 ED visits in last 6 months for similar OB GYN issues [facility] & encouraged to find a pcp per EPIC note PMH various STD     Subjective/Objective Assessment Detail:   Pt confirms she has Medicaid service States she believes [facility] is [facility] but she has been told when she calls she can not make an appointment & they don't have her in their system  Pt to be sent a list of medicaid providers also by DSS (15-20 pink pages) via mail also per Ms Chestine Sporelark at [phone]     Action/Plan:   Cm Reviewed EPIC notes Spoke with pt Discussed types of medicaid, need & importance of pcp, list of medicaid pcps & need to clarify with DSS her assigned pcp   Action/Plan Detail:   Encouraged to seek a medicaid pcp from provided medicaid list, contact her choice to see if accepting medicaid pt & let DSS know via phone or person her choice Meanwhile [facility]   Anticipated DC Date:  01/21/2014     Status Recommendation to Physician:   Result of Recommendation:    Other ED Services  Consult Working Plan    DC Planning Services  Other  Outpatient Services - Pt will follow up  PCP issues    Choice offered to / List presented to:            Status of service:  Completed, signed off  ED Comments:  pt given contact information for DSS & local health department Discussed 4 ED visits  ED Comments Detail:

## 2014-01-21 NOTE — ED Notes (Signed)
Pt. Is unable to use the restroom at this time, but is aware that we need a specimen. Pt will let us know when she is able to give a urine specimen.

## 2014-01-21 NOTE — ED Notes (Signed)
Pt. stated vaginal itching since 01/18/14. Pt stated pain 4 out of 10 and a lot of white discharge 01/19/14. Ibuprofen was taken for pain and helped relieve pain. Last menstrual cycle 02/14. Pt using condoms for birth control

## 2014-01-21 NOTE — Discharge Instructions (Signed)
Call for a follow up appointment with a Family or Primary Care Provider.  °Return if Symptoms worsen.   °Take medication as prescribed.  ° ° °Emergency Department Resource Guide °1) Find a Doctor and Pay Out of Pocket °Although you won't have to find out who is covered by your insurance plan, it is a good idea to ask around and get recommendations. You will then need to call the office and see if the doctor you have chosen will accept you as a new patient and what types of options they offer for patients who are self-pay. Some doctors offer discounts or will set up payment plans for their patients who do not have insurance, but you will need to ask so you aren't surprised when you get to your appointment. ° °2) Contact Your Local Health Department °Not all health departments have doctors that can see patients for sick visits, but many do, so it is worth a call to see if yours does. If you don't know where your local health department is, you can check in your phone book. The CDC also has a tool to help you locate your state's health department, and many state websites also have listings of all of their local health departments. ° °3) Find a Walk-in Clinic °If your illness is not likely to be very severe or complicated, you may want to try a walk in clinic. These are popping up all over the country in pharmacies, drugstores, and shopping centers. They're usually staffed by nurse practitioners or physician assistants that have been trained to treat common illnesses and complaints. They're usually fairly quick and inexpensive. However, if you have serious medical issues or chronic medical problems, these are probably not your best option. ° °No Primary Care Doctor: °- Call [facility] at  832-8000 - they can help you locate a primary care doctor that  accepts your insurance, provides certain services, etc. °- Physician Referral Service- 1-800-533-3463 ° °Chronic Pain Problems: °Organization         Address  Phone    Notes  °[facility]  (336) 297-2271 Patients need to be referred by their primary care doctor.  ° °Medication Assistance: °Organization         Address  Phone   Notes  °[place] [facility] [address] °[place], [place] [address] (336) 641-8030 --Must be a resident of [place] °-- Must have NO insurance coverage whatsoever (no Medicaid/ Medicare, etc.) °-- The pt. MUST have a primary care doctor that directs their care regularly and follows them in the community °  °[facility]  (866) 331-1348   °[employer]  (888) 892-1162   ° °Agencies that provide inexpensive medical care: °Organization         Address  Phone   Notes  °[facility]  (336) 832-8035   °[facility]    (336) 832-7272   °[facility] [address] °[place], [place] [address] (336) 832-4777   °[facility] [address], °[place] (336) 271-4999   °[facility]    (336) 373-0678   °[facility]    (336) 272-1050   °[facility] ° [address], [place] Phone:  (336) 832-4444, Fax:  (336) 832-4440 Hours of Operation:  9 am - 6 pm, M-F.  Also accepts Medicaid/Medicare and self-pay.  °[facility] ° [address], [place]   Phone: (336) 832-3150, Fax: (336) 832-3151. Hours of Operation:  8:30 am - 5:30 pm, M-F.  Also accepts Medicaid and self-pay.  °[facility] [place] [address], [place] Phone: (336) 878-6027   °[facility] [address], [place], [place] (336)723-1848, Ext. 123 Mondays & Thursdays: 7-9 AM.  First 15 patients are seen on a first come, first serve basis. °  ° °Medicaid-accepting [place] Providers: ° °Organization         Address  Phone   Notes  °[facility] [address], [place] (336) 641-2100 Also accepts self-pay patients.  °[facility]  [address], [place] ° (336) 856-9996   °[facility] [address], [place] (336) 288-8857   °[facility] [address] [place] [address], [place] (336) 299-7000   °Veita Bland [address], [place]  ° (336) 373-1557 Only accepts [place] Access Medicaid patients after they have their name applied to their card.  ° °Self-Pay (no insurance) in [place]: ° °Organization         Address  Phone   Notes  °Sickle Cell Patients, [facility] [address], [place] (336) 832-1970   °[facility] [address], [place] (336) 832-4400   °[facility] ° [address], [place] (336) 992-4800   °[facility]/Dr. Osei-Bonsu ° [address] [place] [address], [place] or [address], [place] (336) 841-8500 Phone number for both [place] and [place] locations is the same.  °[facility] [address], [place] (336) 299-0000   °[facility] [address] [place] [address], [place] or [address] (336) 852-7530 °(336) 878-2260   °[facility] [address], [place] (336) 350-1642, phone; (336) 294-5005, fax Sees patients 1st and 3rd Saturday of every month.  Must not qualify for public or private insurance (i.e. Medicaid, Medicare, [place] Health Choice, Veterans' Benefits) • Household income should be no more than 200% of the poverty level •The clinic cannot treat you if you are pregnant or think you are pregnant • Sexually transmitted diseases are not treated at the clinic.  ° ° °Dental Care: °Organization         Address  Phone  Notes  °[facility] [address], [place] (336) 641-6152 Accepts children up to age 21 who are enrolled in Medicaid or [place] Health Choice; pregnant women with a Medicaid card; and children who have  applied for Medicaid or [place] Health Choice, but were declined, whose parents can pay a reduced fee at time of service.  °[facility] [place]  [address], [place] (336) 641-7733 Accepts children up to age 21 who are enrolled in Medicaid or [place] Health Choice; pregnant women with a Medicaid card; and children who have applied for Medicaid or [place] Health Choice, but were declined, whose parents can pay a reduced fee at time of service.  °[facility] ° [address], [place] (336) 641-4533 Patients are seen by appointment only. Walk-ins are not accepted. [facility] will see patients 18 years of age and older. °Monday - Tuesday (8am-5pm) °Most Wednesdays (8:30-5pm) °$30 per visit, cash only  °[facility] ° [address], [place] (336) 641-4533 Patients are seen by appointment only. Walk-ins are not accepted. [facility] will see patients 18 years of age and older. °One Wednesday Evening (Monthly: Volunteer Based).  $30 per visit, cash only  °[facility]  (919) 537-3737 for adults; Children under age 4, call Graduate Pediatric Dentistry at (919) [phone]. Children aged 4-14, please call (919) [phone] to request a pediatric application. ° Dental services are provided in all areas of dental care including fillings, crowns and bridges, complete and partial dentures, implants, gum treatment, root canals, and extractions. Preventive care is also provided. Treatment is provided to both adults and children. °Patients are selected via a lottery and there is often a waiting list. °  °[facility] [address], °[place] ° (336) 763-8833 [email or web address] °  °[facility] [address], [place], [place] (336)723-1848, Ext. 123 Second and Fourth Thursday of each month, opens at 6:30 AM; Clinic ends at 9 AM.  Patients are seen on a first-come first-served basis, and a  limited number are seen during each clinic.  ° °[facility] ° [address], [place], [place] (336) 723-7904   Eligibility Requirements °You must have lived in [place], [place], or [place] counties for at least the last three months. °  You cannot be eligible for state or federal sponsored healthcare insurance, including Veterans Administration, Medicaid, or Medicare. °  You generally cannot be eligible for healthcare insurance through your employer.  °  How to apply: °Eligibility screenings are held every Tuesday and Wednesday afternoon from 1:00 pm until 4:00 pm. You do not need an appointment for the interview!  °[facility] [address], [place], [place] 336-631-2330   °[facility]  336-342-8273   °[facility]  336-703-3100   °[facility]  336-570-6415   ° °Behavioral Health Resources in the Community: °Intensive Outpatient Programs °Organization         Address  Phone  Notes  °[facility] [address], [place], [place] 336-878-6098   °[facility] [address], [place], [place] 336-832-9800   °[facility] [address], [place], [place] ° 336-882-2125   °[facility] [address],  °[place], [place] 1-800-853-5163 or 336-641-4981   °Substance Abuse Resources °Organization         Address  Phone  Notes  °[facility]  336-882-2125   °[facility]  336-784-9470   °[facility]  336-285-9073   °[facility]  336-845-3988   °Residential & Outpatient Substance Abuse Program  1-800-659-3381   °Psychological Services °Organization         Address  Phone  Notes  °[facility]  336- 832-9600   °[facility]  336- 378-7881   °[facility] [address], [place] 1-800-853-5163 or 336-641-4981   ° °Mobile Crisis Teams °Organization          Address  Phone  Notes  °[facility], Mobile Crisis Care Unit  1-877-626-1772   °Assertive °[facility] ° [address] [place], [place] 336-834-9664   °Sharon DeEsch [address] °[place] 336-554-5454   ° °Self-Help/Support Groups °Organization         Address  Phone             Notes  °[facility] - variety of   support groups  336- 373-1402 Call for more information  °Narcotics Anonymous (NA), [facility] [address], °[place]  2 meetings at this location  ° °Residential Treatment Programs °Organization         Address  Phone  Notes  °[facility] [address],    °[place]  1-866-801-8205   °[facility] ° [address], [place], [place] 704-293-8524   °[facility] [address], [place] 336-845-3988 Admissions: 8am-3pm M-F  °[facility] [address]    °[place], [place] 336-841-1104   °[facility] [address], [place], [place] 336-379-7146   °[facility] [address]  °[place], [place] 336-285-9073   °[facility] - Intensive Outpatient [address], [place], [place] 336-852-3033   °[facility] [address]  °[place], [place] 1-877-615-2722 or 336-784-9470   °[facility] ([facility] [address] [place], [place] 336-227-7417 Accepts Medicaid  °[facility] [address]  °[place] 1-800-659-3381 Substance Abuse/Addiction Treatment  ° °[place] Behavioral Health Resources °Organization         Address  Phone  Notes  °[facility]  (888) 581-9988   °Julie Brannon, PhD [address] [place], [place]   (336) 349-5553 or (336) 951-0000   °[facility]   [address] °[place], [place] (336) 349-4454   °[facility] [address], [place], [place] (336) 342-8316 Insurance/Medicaid/sponsorship  through [facility]  °[facility] [address]                                    [place], [place] (336) 342-8316 Therapy/tele-psych/case  °[facility] [address]  ° [place], [place] (336) 349-2233    °Dr. Arfeen  (336) 349-4544   °[facility] [place]  [employer] [place] [facility] 1) [address], [place] °2) [address], [place] °3)  [address], [place] (336) 349-3220 °(336) 342-7768 ° °(336) 342-8140   °[place] [facility] (336) 342-1394 or (336) 342-3537 (After Hours)    ° °

## 2014-01-21 NOTE — ED Provider Notes (Signed)
CSN: [id number]     Arrival date & time 01/21/14  4098 History   First MD Initiated Contact with Patient 01/21/14 1009     Chief Complaint  Patient presents with  . Vaginal Itching  . Vaginal Pain     (Consider location/radiation/quality/duration/timing/severity/associated sxs/prior Treatment) HPI Comments: Terry [name] is a 18 y.o. female with a past medical history of gonorrhea presenting the Emergency Department with a chief complaint of abnormal vaginal discharge for 3 day.  She reports left lower and lower abdominal discomfort for 2 days.  Reports increase in white discharge, described as malodorous.  Denies known exposures to possible STI. She reports she is not currently sexually active, but uses condoms with each sexual encounter.  Patient's last menstrual period was 12/22/2013.    The history is provided by the patient and medical records. No language interpreter was used.    Past Medical History  Diagnosis Date  . No pertinent past medical history   . Gonorrhea   . Medical history non-contributory    Past Surgical History  Procedure Laterality Date  . No past surgeries     Family History  Problem Relation Age of Onset  . Diabetes Maternal Uncle   . Cancer Maternal Grandmother     pancreatic   History  Substance Use Topics  . Smoking status: Never Smoker   . Smokeless tobacco: Never Used  . Alcohol Use: No   OB History   Grav Para Term Preterm Abortions TAB SAB Ect Mult Living   0              Review of Systems  Constitutional: Negative for fever and chills.  Gastrointestinal: Positive for abdominal pain. Negative for nausea, vomiting, diarrhea and constipation.  Genitourinary: Positive for vaginal discharge. Negative for dysuria, hematuria, flank pain and vaginal bleeding.      Allergies  Review of patient's allergies indicates no known allergies.  Home Medications  No current outpatient prescriptions on file. BP 109/72  Pulse 84  Temp(Src)  98.3 F (36.8 C) (Oral)  Resp 16  SpO2 100%  LMP 12/22/2013 Physical Exam  Nursing note and vitals reviewed. Constitutional: She is oriented to person, place, and time. She appears well-developed and well-nourished. No distress.  HENT:  Head: Normocephalic and atraumatic.  Eyes: EOM are normal. Pupils are equal, round, and reactive to light. No scleral icterus.  Neck: Neck supple.  Cardiovascular: Normal rate, regular rhythm and normal heart sounds.   No murmur heard. Pulmonary/Chest: Effort normal and breath sounds normal. She has no wheezes.  Abdominal: Soft. Bowel sounds are normal. There is no tenderness. There is no rebound, no guarding and no CVA tenderness.  Genitourinary: There is lesion on the right labia. There is lesion on the left labia. Uterus is not tender. Right adnexum displays no tenderness. Left adnexum displays no tenderness. Vaginal discharge found.  Multiple single raised genital lesions to bilateral labia.   Musculoskeletal: Normal range of motion. She exhibits no edema.  Neurological: She is alert and oriented to person, place, and time.  Skin: Skin is warm and dry. No rash noted.  Psychiatric: She has a normal mood and affect. Her behavior is normal.    ED Course  Procedures (including critical care time) Labs Review Labs Reviewed  WET PREP, GENITAL - Abnormal; Notable for the following:    Clue Cells Wet Prep HPF POC FEW (*)    WBC, Wet Prep HPF POC FEW (*)    All other components within normal  limits  URINALYSIS, ROUTINE W REFLEX MICROSCOPIC - Abnormal; Notable for the following:    APPearance CLOUDY (*)    All other components within normal limits  GC/CHLAMYDIA PROBE AMP  POC URINE PREG, ED   Imaging Review No results found.   EKG Interpretation None      MDM   Final diagnoses:  BV (bacterial vaginosis)  Genital lesion, female   Pt with abnormal discharge for 3 days. Negative pregnancy.  Wet prep shows clue cells.  PE shows external  labial lesions consistent with Herpetic outbreak.  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 Discussed lab results, and treatment plan with the patient. Return precautions given. Reports understanding and no other concerns at this time.  Patient is stable for discharge at this time.  Meds given in ED:  Medications - No data to display  Discharge Medication List as of 01/21/2014 12:49 PM    START taking these medications   Details  metroNIDAZOLE (FLAGYL) 500 MG tablet Take 1 tablet (500 mg total) by mouth 2 (two) times daily., Starting 01/21/2014, Until Discontinued, Print    valACYclovir (VALTREX) 1000 MG tablet Take 1 tablet (1,000 mg total) by mouth 3 (three) times daily., Starting 01/21/2014, Until Discontinued, Print            Clabe SealLauren M Izreal Kock, PA-C 01/22/14 2307

## 2014-01-22 LAB — GC/CHLAMYDIA PROBE AMP
CT PROBE, AMP APTIMA: NEGATIVE
GC Probe RNA: NEGATIVE

## 2014-01-23 NOTE — ED Provider Notes (Signed)
Medical screening examination/treatment/procedure(s) were performed by non-physician practitioner and as supervising physician I was immediately available for consultation/collaboration.   EKG Interpretation None       Shabree Tebbetts F Zahava Quant, MD 01/23/14 0805 

## 2014-03-04 ENCOUNTER — Encounter (HOSPITAL_COMMUNITY): Payer: Self-pay | Admitting: Emergency Medicine

## 2014-03-04 ENCOUNTER — Emergency Department (HOSPITAL_COMMUNITY)
Admission: EM | Admit: 2014-03-04 | Discharge: 2014-03-04 | Disposition: A | Discharge status: Home / Self Care | Payer: Medicaid Other | Attending: Emergency Medicine | Admitting: Emergency Medicine

## 2014-03-04 DIAGNOSIS — Z79899 Other long term (current) drug therapy: Secondary | ICD-10-CM | POA: Insufficient documentation

## 2014-03-04 DIAGNOSIS — S46909A Unspecified injury of unspecified muscle, fascia and tendon at shoulder and upper arm level, unspecified arm, initial encounter: Secondary | ICD-10-CM | POA: Insufficient documentation

## 2014-03-04 DIAGNOSIS — S139XXA Sprain of joints and ligaments of unspecified parts of neck, initial encounter: Secondary | ICD-10-CM | POA: Insufficient documentation

## 2014-03-04 DIAGNOSIS — Z792 Long term (current) use of antibiotics: Secondary | ICD-10-CM | POA: Diagnosis not present

## 2014-03-04 DIAGNOSIS — S0993XA Unspecified injury of face, initial encounter: Secondary | ICD-10-CM | POA: Diagnosis present

## 2014-03-04 DIAGNOSIS — Z8619 Personal history of other infectious and parasitic diseases: Secondary | ICD-10-CM | POA: Insufficient documentation

## 2014-03-04 DIAGNOSIS — Y92410 Unspecified street and highway as the place of occurrence of the external cause: Secondary | ICD-10-CM | POA: Diagnosis not present

## 2014-03-04 DIAGNOSIS — S0990XA Unspecified injury of head, initial encounter: Secondary | ICD-10-CM | POA: Diagnosis not present

## 2014-03-04 DIAGNOSIS — Y9389 Activity, other specified: Secondary | ICD-10-CM | POA: Insufficient documentation

## 2014-03-04 DIAGNOSIS — S4980XA Other specified injuries of shoulder and upper arm, unspecified arm, initial encounter: Secondary | ICD-10-CM | POA: Diagnosis not present

## 2014-03-04 DIAGNOSIS — S161XXA Strain of muscle, fascia and tendon at neck level, initial encounter: Secondary | ICD-10-CM

## 2014-03-04 DIAGNOSIS — S199XXA Unspecified injury of neck, initial encounter: Secondary | ICD-10-CM | POA: Diagnosis present

## 2014-03-04 MED ORDER — CYCLOBENZAPRINE HCL 10 MG PO TABS: 10.0000 mg | ORAL_TABLET | Freq: Two times a day (BID) | ORAL | Status: DC | PRN

## 2014-03-04 MED ORDER — CYCLOBENZAPRINE HCL 10 MG PO TABS
10.0000 mg | ORAL_TABLET | Freq: Once | ORAL | Status: AC
  Administered 2014-03-04: 10 mg via ORAL
  Filled 2014-03-04: qty 1

## 2014-03-04 MED ORDER — IBUPROFEN 800 MG PO TABS: 800.0000 mg | ORAL_TABLET | Freq: Three times a day (TID) | ORAL | Status: DC
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
## 2014-03-05 NOTE — ED Provider Notes (Signed)
Medical screening examination/treatment/procedure(s) were performed by non-physician practitioner and as supervising physician I was immediately available for consultation/collaboration.   EKG Interpretation None        Richardean Canalavid H Kehlani Vancamp, MD 03/05/14 [phone]

## 2014-03-27 ENCOUNTER — Inpatient Hospital Stay (HOSPITAL_COMMUNITY)
Admission: AD | Admit: 2014-03-27 | Discharge: 2014-03-27 | Disposition: A | Discharge status: Home / Self Care | Payer: Medicaid Other | Source: Ambulatory Visit | Attending: Obstetrics & Gynecology | Admitting: Obstetrics & Gynecology

## 2014-03-27 ENCOUNTER — Encounter (HOSPITAL_COMMUNITY): Payer: Self-pay | Admitting: *Deleted

## 2014-03-27 DIAGNOSIS — N946 Dysmenorrhea, unspecified: Secondary | ICD-10-CM | POA: Insufficient documentation

## 2014-03-27 DIAGNOSIS — R112 Nausea with vomiting, unspecified: Secondary | ICD-10-CM | POA: Insufficient documentation

## 2014-03-27 DIAGNOSIS — R109 Unspecified abdominal pain: Secondary | ICD-10-CM | POA: Insufficient documentation

## 2014-03-27 DIAGNOSIS — Z309 Encounter for contraceptive management, unspecified: Secondary | ICD-10-CM

## 2014-03-27 LAB — URINALYSIS, ROUTINE W REFLEX MICROSCOPIC
BILIRUBIN URINE: NEGATIVE
Glucose, UA: NEGATIVE mg/dL
KETONES UR: 15 mg/dL — AB
Leukocytes, UA: NEGATIVE
NITRITE: NEGATIVE
Protein, ur: 30 mg/dL — AB
Specific Gravity, Urine: >1.03 — ABNORMAL HIGH (ref 1.005–1.030)
Urobilinogen, UA: 0.2 mg/dL (ref 0.0–1.0)
pH: 6 (ref 5.0–8.0)

## 2014-03-27 LAB — URINE MICROSCOPIC-ADD ON

## 2014-03-27 LAB — WET PREP, GENITAL
CLUE CELLS WET PREP: NONE SEEN
Trich, Wet Prep: NONE SEEN
Yeast Wet Prep HPF POC: NONE SEEN

## 2014-03-27 LAB — POCT PREGNANCY, URINE: Preg Test, Ur: NEGATIVE

## 2014-03-27 MED ORDER — PROMETHAZINE HCL 25 MG PO TABS: 25.0000 mg | ORAL_TABLET | Freq: Once | ORAL | Status: DC

## 2014-03-27 MED ORDER — KETOROLAC TROMETHAMINE 60 MG/2ML IM SOLN
60.0000 mg | Freq: Once | INTRAMUSCULAR | Status: AC
  Administered 2014-03-27: 60 mg via INTRAMUSCULAR
  Filled 2014-03-27: qty 2

## 2014-03-27 MED ORDER — ONDANSETRON 8 MG PO TBDP
8.0000 mg | ORAL_TABLET | Freq: Once | ORAL | Status: AC
  Administered 2014-03-27: 8 mg via ORAL
  Filled 2014-03-27: qty 1

## 2014-03-27 MED ORDER — NORGESTIMATE-ETH ESTRADIOL 0.25-35 MG-MCG PO TABS: 1.0000 | ORAL_TABLET | Freq: Every day | ORAL | Status: DC

## 2014-03-27 MED ORDER — TRAMADOL HCL 50 MG PO TABS: 50.0000 mg | ORAL_TABLET | Freq: Four times a day (QID) | ORAL | Status: DC | PRN

## 2014-03-27 NOTE — MAU Note (Signed)
Per EMS- cramping in lower abd- comes every couple months, prior to onset of cycle
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
## 2014-03-27 NOTE — MAU Provider Note (Signed)
History     CSN: [id number]  Arrival date and time: 03/27/14 [id number]   First Provider Initiated Contact with Patient 03/27/14 [phone]      Chief Complaint  Patient presents with  . Abdominal Pain   HPI Comments: Terry [name] 18 y.o. G0P0 presents to MAU via EMS for abdominal cramping with menstrual cycle. She also has nausea and vomiting. LMP4/20/15. She is sexually active with no birth control. She was on depo at one time and " just didn't go back to her appointment"  Abdominal Pain Associated symptoms include nausea and vomiting.      Past Medical History  Diagnosis Date  . No pertinent past medical history   . Gonorrhea   . Medical history non-contributory     Past Surgical History  Procedure Laterality Date  . No past surgeries      Family History  Problem Relation Age of Onset  . Diabetes Maternal Uncle   . Cancer Maternal Grandmother     pancreatic    History  Substance Use Topics  . Smoking status: Never Smoker   . Smokeless tobacco: Never Used  . Alcohol Use: No    Allergies: No Known Allergies  Prescriptions prior to admission  Medication Sig Dispense Refill  . ibuprofen (ADVIL,MOTRIN) 800 MG tablet Take 1 tablet (800 mg total) by mouth 3 (three) times daily.  21 tablet  0    Review of Systems  Constitutional: Negative.        Appears in pain  HENT: Negative.   Gastrointestinal: Positive for nausea, vomiting and abdominal pain.  Genitourinary: Negative.   Musculoskeletal: Negative.   Skin: Negative.   Neurological: Negative.   Psychiatric/Behavioral: Negative.    Physical Exam   Blood pressure 99/50, pulse 61, temperature 98.3 F (36.8 C), temperature source Oral, resp. rate 18, last menstrual period 02/25/2014, SpO2 100.00%.  Physical Exam  Constitutional: She is oriented to person, place, and time. She appears well-developed and well-nourished. No distress.  HENT:  Head: Normocephalic and atraumatic.  Eyes: Pupils are equal, round,  and reactive to light.  GI: Soft. She exhibits no distension. There is tenderness. There is no rebound and no guarding.  Genitourinary:  Genital:External negative Vaginal:mod amt blood Cervix:closed/ thick/ no CMT Bimanual: slight tender uterus/ no adnexal mass   Musculoskeletal: Normal range of motion.  Neurological: She is alert and oriented to person, place, and time.  Skin: Skin is warm and dry.  Psychiatric: She has a normal mood and affect. Her behavior is normal. Judgment and thought content normal.   Results for orders placed during the hospital encounter of 03/27/14 (from the past 24 hour(s))  URINALYSIS, ROUTINE W REFLEX MICROSCOPIC     Status: Abnormal   Collection Time    03/27/14  7:50 AM      Result Value Ref Range   Color, Urine AMBER (*) YELLOW   APPearance CLOUDY (*) CLEAR   Specific Gravity, Urine >1.030 (*) 1.005 - 1.030   pH 6.0  5.0 - 8.0   Glucose, UA NEGATIVE  NEGATIVE mg/dL   Hgb urine dipstick LARGE (*) NEGATIVE   Bilirubin Urine NEGATIVE  NEGATIVE   Ketones, ur 15 (*) NEGATIVE mg/dL   Protein, ur 30 (*) NEGATIVE mg/dL   Urobilinogen, UA 0.2  0.0 - 1.0 mg/dL   Nitrite NEGATIVE  NEGATIVE   Leukocytes, UA NEGATIVE  NEGATIVE  URINE MICROSCOPIC-ADD ON     Status: Abnormal   Collection Time    03/27/14  7:50 AM  Result Value Ref Range   Squamous Epithelial / LPF FEW (*) RARE   RBC / HPF 11-20  <3 RBC/hpf   Bacteria, UA RARE  RARE  POCT PREGNANCY, URINE     Status: None   Collection Time    03/27/14  7:58 AM      Result Value Ref Range   Preg Test, Ur NEGATIVE  NEGATIVE  WET PREP, GENITAL     Status: Abnormal   Collection Time    03/27/14  9:35 AM      Result Value Ref Range   Yeast Wet Prep HPF POC NONE SEEN  NONE SEEN   Trich, Wet Prep NONE SEEN  NONE SEEN   Clue Cells Wet Prep HPF POC NONE SEEN  NONE SEEN   WBC, Wet Prep HPF POC MODERATE (*) NONE SEEN       MAU Course  Procedures  MDM  Toradol 60 mg IM zofran 8 mg ODT Wet prep/  GC/Chlamydia  Assessment and Plan   A: Dysmenorrhea  P: Start BCP today/ [facility]ocyclen daily [facility] Phenergan 25 mg po q6 hrs Ultram 50 mg po q6 hrs  [name] 03/27/2014, 9:02 AM

## 2014-03-27 NOTE — MAU Note (Signed)
Tried motrin 800 mg at midnight last night with no relief; sexual active and uses condoms;

## 2014-03-27 NOTE — MAU Note (Signed)
Has had N&V with severe cramps before previous periods; having some spotting;

## 2014-03-27 NOTE — Discharge Instructions (Signed)
Dysmenorrhea °Menstrual cramps (dysmenorrhea) are caused by the muscles of the uterus tightening (contracting) during a menstrual period. For some women, this discomfort is merely bothersome. For others, dysmenorrhea can be severe enough to interfere with everyday activities for a few days each month. °Primary dysmenorrhea is menstrual cramps that last a couple of days when you start having menstrual periods or soon after. This often begins after a teenager starts having her period. As a woman gets older or has a baby, the cramps will usually lessen or disappear. Secondary dysmenorrhea begins later in life, lasts longer, and the pain may be stronger than primary dysmenorrhea. The pain may start before the period and last a few days after the period.  °CAUSES  °Dysmenorrhea is usually caused by an underlying problem, such as: °· The tissue lining the uterus grows outside of the uterus in other areas of the body (endometriosis). °· The endometrial tissue, which normally lines the uterus, is found in or grows into the muscular walls of the uterus (adenomyosis). °· The pelvic blood vessels are engorged with blood just before the menstrual period (pelvic congestive syndrome). °· Overgrowth of cells (polyps) in the lining of the uterus or cervix. °· Falling down of the uterus (prolapse) because of loose or stretched ligaments. °· Depression. °· Bladder problems, infection, or inflammation. °· Problems with the intestine, a tumor, or irritable bowel syndrome. °· Cancer of the female organs or bladder. °· A severely tipped uterus. °· A very tight opening or closed cervix. °· Noncancerous tumors of the uterus (fibroids). °· Pelvic inflammatory disease (PID). °· Pelvic scarring (adhesions) from a previous surgery. °· Ovarian cyst. °· An intrauterine device (IUD) used for birth control. °RISK FACTORS °You may be at greater risk of dysmenorrhea if: °· You are younger than age 30. °· You started puberty early. °· You have  irregular or heavy bleeding. °· You have never given birth. °· You have a family history of this problem. °· You are a smoker. °SIGNS AND SYMPTOMS  °· Cramping or throbbing pain in your lower abdomen. °· Headaches. °· Lower back pain. °· Nausea or vomiting. °· Diarrhea. °· Sweating or dizziness. °· Loose stools. °DIAGNOSIS  °A diagnosis is based on your history, symptoms, physical exam, diagnostic tests, or procedures. Diagnostic tests or procedures may include: °· Blood tests. °· Ultrasonography. °· An examination of the lining of the uterus (dilation and curettage, D&C). °· An examination inside your abdomen or pelvis with a scope (laparoscopy). °· X-rays. °· CT scan. °· MRI. °· An examination inside the bladder with a scope (cystoscopy). °· An examination inside the intestine or stomach with a scope (colonoscopy, gastroscopy). °TREATMENT  °Treatment depends on the cause of the dysmenorrhea. Treatment may include: °· Pain medicine prescribed by your health care provider. °· Birth control pills or an IUD with progesterone hormone in it. °· Hormone replacement therapy. °· Nonsteroidal anti-inflammatory drugs (NSAIDs). These may help stop the production of prostaglandins. °· Surgery to remove adhesions, endometriosis, ovarian cyst, or fibroids. °· Removal of the uterus (hysterectomy). °· Progesterone shots to stop the menstrual period. °· Cutting the nerves on the sacrum that go to the female organs (presacral neurectomy). °· Electric current to the sacral nerves (sacral nerve stimulation). °· Antidepressant medicine. °· Psychiatric therapy, counseling, or group therapy. °· Exercise and physical therapy. °· Meditation and yoga therapy. °· Acupuncture. °HOME CARE INSTRUCTIONS  °· Only take over-the-counter or prescription medicines as directed by your health care provider. °· Place a heating pad   or hot water bottle on your lower back or abdomen. Do not sleep with the heating pad.  Use aerobic exercises, walking,  swimming, biking, and other exercises to help lessen the cramping.  Massage to the lower back or abdomen may help.  Stop smoking.  Avoid alcohol and caffeine. SEEK MEDICAL CARE IF:   Your pain does not get better with medicine.  You have pain with sexual intercourse.  Your pain increases and is not controlled with medicines.  You have abnormal vaginal bleeding with your period.  You develop nausea or vomiting with your period that is not controlled with medicine. SEEK IMMEDIATE MEDICAL CARE IF:  You pass out.  Document Released: 10/25/2005 Document Revised: 06/27/2013 Document Reviewed: 04/12/2013 [facility] Patient Information 2014 [place]ExitCare, [place]LL[place]. Oral Contraception Use Oral contraceptive pills (OCPs) are medicines taken to prevent pregnancy. OCPs work by preventing the ovaries from releasing eggs. The hormones in OCPs also cause the cervical mucus to thicken, preventing the sperm from entering the uterus. The hormones also cause the uterine lining to become thin, not allowing a fertilized egg to attach to the inside of the uterus. OCPs are highly effective when taken exactly as prescribed. However, OCPs do not prevent sexually transmitted diseases (STDs). Safe sex practices, such as using condoms along with an OCP, can help prevent STDs. Before taking OCPs, you may have a physical exam and Pap test. Your health care provider may also order blood tests if necessary. Your health care provider will make sure you are a good candidate for oral contraception. Discuss with your health care provider the possible side effects of the OCP you may be prescribed. When starting an OCP, it can take 2 to 3 months for the body to adjust to the changes in hormone levels in your body.  HOW TO TAKE ORAL CONTRACEPTIVE PILLS Your health care provider may advise you on how to start taking the first cycle of OCPs. Otherwise, you can:   Start on day 1 of your menstrual period. You will not need any backup  contraceptive protection with this start time.   Start on the first Sunday after your menstrual period or the day you get your prescription. In these cases, you will need to use backup contraceptive protection for the first week.   Start the pill at any time of your cycle. If you take the pill within 5 days of the start of your period, you are protected against pregnancy right away. In this case, you will not need a backup form of birth control. If you start at any other time of your menstrual cycle, you will need to use another form of birth control for 7 days. If your OCP is the type called a minipill, it will protect you from pregnancy after taking it for 2 days (48 hours). After you have started taking OCPs:   If you forget to take 1 pill, take it as soon as you remember. Take the next pill at the regular time.   If you miss 2 or more pills, call your health care provider because different pills have different instructions for missed doses. Use backup birth control until your next menstrual period starts.   If you use a 28-day pack that contains inactive pills and you miss 1 of the last 7 pills (pills with no hormones), it will not matter. Throw away the rest of the nonhormone pills and start a new pill pack.  No matter which day you start the OCP, you will always  start a new pack on that same day of the week. Have an extra pack of OCPs and a backup contraceptive method available in case you miss some pills or lose your OCP pack.  HOME CARE INSTRUCTIONS   Do not smoke.   Always use a condom to protect against STDs. OCPs do not protect against STDs.   Use a calendar to mark your menstrual period days.   Read the information and directions that came with your OCP. Talk to your health care provider if you have questions.  SEEK MEDICAL CARE IF:   You develop nausea and vomiting.   You have abnormal vaginal discharge or bleeding.   You develop a rash.   You miss your  menstrual period.   You are losing your hair.   You need treatment for mood swings or depression.   You get dizzy when taking the OCP.   You develop acne from taking the OCP.   You become pregnant.  SEEK IMMEDIATE MEDICAL CARE IF:   You develop chest pain.   You develop shortness of breath.   You have an uncontrolled or severe headache.   You develop numbness or slurred speech.   You develop visual problems.   You develop pain, redness, and swelling in the legs.  Document Released: 10/14/2011 Document Revised: 06/27/2013 Document Reviewed: 04/15/2013 [facility]Exit[facility] Patient Information 2014 [place], [place]LL[place].

## 2014-03-28 ENCOUNTER — Encounter (HOSPITAL_COMMUNITY): Payer: Self-pay | Admitting: *Deleted

## 2014-03-28 LAB — GC/CHLAMYDIA PROBE AMP
CT Probe RNA: POSITIVE — AB
GC Probe RNA: NEGATIVE

## 2014-04-03 ENCOUNTER — Telehealth: Payer: Self-pay | Admitting: *Deleted

## 2014-04-03 DIAGNOSIS — A749 Chlamydial infection, unspecified: Secondary | ICD-10-CM

## 2014-04-03 MED ORDER — AZITHROMYCIN 250 MG PO TABS: ORAL_TABLET | ORAL | Status: DC

## 2014-04-03 NOTE — Telephone Encounter (Signed)
Message copied by Gerome Apley on Wed Apr 03, 2014  8:55 AM ------      Message from: [place]      Created: Wed Apr 03, 2014  8:35 AM       Patient returned call, notified her of positive chlamydia culture.  Patient has not been treated and will need Rx called in per protocol to [facility] [address]. ------

## 2014-05-31 ENCOUNTER — Encounter (HOSPITAL_COMMUNITY): Payer: Self-pay | Admitting: *Deleted

## 2014-05-31 ENCOUNTER — Inpatient Hospital Stay (HOSPITAL_COMMUNITY)
Admission: AD | Admit: 2014-05-31 | Discharge: 2014-05-31 | Disposition: A | Discharge status: Home / Self Care | Payer: Medicaid Other | Source: Ambulatory Visit | Attending: Obstetrics & Gynecology | Admitting: Obstetrics & Gynecology

## 2014-05-31 DIAGNOSIS — N390 Urinary tract infection, site not specified: Secondary | ICD-10-CM | POA: Insufficient documentation

## 2014-05-31 DIAGNOSIS — N760 Acute vaginitis: Secondary | ICD-10-CM | POA: Insufficient documentation

## 2014-05-31 DIAGNOSIS — A499 Bacterial infection, unspecified: Secondary | ICD-10-CM | POA: Diagnosis not present

## 2014-05-31 DIAGNOSIS — B9689 Other specified bacterial agents as the cause of diseases classified elsewhere: Secondary | ICD-10-CM | POA: Diagnosis not present

## 2014-05-31 DIAGNOSIS — R109 Unspecified abdominal pain: Secondary | ICD-10-CM | POA: Diagnosis present

## 2014-05-31 LAB — WET PREP, GENITAL
Trich, Wet Prep: NONE SEEN
Yeast Wet Prep HPF POC: NONE SEEN

## 2014-05-31 LAB — URINALYSIS, ROUTINE W REFLEX MICROSCOPIC
Glucose, UA: NEGATIVE mg/dL
Hgb urine dipstick: NEGATIVE
Ketones, ur: 15 mg/dL — AB
Leukocytes, UA: NEGATIVE
Nitrite: NEGATIVE
Protein, ur: 100 mg/dL — AB
Specific Gravity, Urine: >1.03 — ABNORMAL HIGH (ref 1.005–1.030)
Urobilinogen, UA: 1 mg/dL (ref 0.0–1.0)
pH: 6 (ref 5.0–8.0)

## 2014-05-31 LAB — URINE MICROSCOPIC-ADD ON

## 2014-05-31 LAB — POCT PREGNANCY, URINE: Preg Test, Ur: NEGATIVE

## 2014-05-31 MED ORDER — CIPROFLOXACIN HCL 500 MG PO TABS: 500.0000 mg | ORAL_TABLET | Freq: Two times a day (BID) | ORAL | Status: DC

## 2014-05-31 MED ORDER — METRONIDAZOLE 500 MG PO TABS: 500.0000 mg | ORAL_TABLET | Freq: Two times a day (BID) | ORAL | Status: DC

## 2014-05-31 MED ORDER — FLUCONAZOLE 150 MG PO TABS: 150.0000 mg | ORAL_TABLET | Freq: Every day | ORAL | Status: DC

## 2014-05-31 NOTE — Discharge Instructions (Signed)
Monilial Vaginitis Vaginitis in a soreness, swelling and redness (inflammation) of the vagina and vulva. Monilial vaginitis is not a sexually transmitted infection. CAUSES  Yeast vaginitis is caused by yeast (candida) that is normally found in your vagina. With a yeast infection, the candida has overgrown in number to a point that upsets the chemical balance. SYMPTOMS   White, thick vaginal discharge.  Swelling, itching, redness and irritation of the vagina and possibly the lips of the vagina (vulva).  Burning or painful urination.  Painful intercourse. DIAGNOSIS  Things that may contribute to monilial vaginitis are:  Postmenopausal and virginal states.  Pregnancy.  Infections.  Being tired, sick or stressed, especially if you had monilial vaginitis in the past.  Diabetes. Good control will help lower the chance.  Birth control pills.  Tight fitting garments.  Using bubble bath, feminine sprays, douches or deodorant tampons.  Taking certain medications that kill germs (antibiotics).  Sporadic recurrence can occur if you become ill. TREATMENT  Your caregiver will give you medication.  There are several kinds of anti monilial vaginal creams and suppositories specific for monilial vaginitis. For recurrent yeast infections, use a suppository or cream in the vagina 2 times a week, or as directed.  Anti-monilial or steroid cream for the itching or irritation of the vulva may also be used. Get your caregiver's permission.  Painting the vagina with methylene blue solution may help if the monilial cream does not work.  Eating yogurt may help prevent monilial vaginitis. HOME CARE INSTRUCTIONS   Finish all medication as prescribed.  Do not have sex until treatment is completed or after your caregiver tells you it is okay.  Take warm sitz baths.  Do not douche.  Do not use tampons, especially scented ones.  Wear cotton underwear.  Avoid tight pants and panty  hose.  Tell your sexual partner that you have a yeast infection. They should go to their caregiver if they have symptoms such as mild rash or itching.  Your sexual partner should be treated as well if your infection is difficult to eliminate.  Practice safer sex. Use condoms.  Some vaginal medications cause latex condoms to fail. Vaginal medications that harm condoms are:  Cleocin cream.  Butoconazole (Femstat).  Terconazole (Terazol) vaginal suppository.  Miconazole (Monistat) (may be purchased over the counter). SEEK MEDICAL CARE IF:   You have a temperature by mouth above 102 F (38.9 C).  The infection is getting worse after 2 days of treatment.  The infection is not getting better after 3 days of treatment.  You develop blisters in or around your vagina.  You develop vaginal bleeding, and it is not your menstrual period.  You have pain when you urinate.  You develop intestinal problems.  You have pain with sexual intercourse. Document Released: 08/04/2005 Document Revised: 01/17/2012 Document Reviewed: 04/18/2009 [facility] Patient Information 2015 [place], [place]. This information is not intended to replace advice given to you by your health care provider. Make sure you discuss any questions you have with your health care provider. Bacterial Vaginosis Bacterial vaginosis is a vaginal infection that occurs when the normal balance of bacteria in the vagina is disrupted. It results from an overgrowth of certain bacteria. This is the most common vaginal infection in women of childbearing age. Treatment is important to prevent complications, especially in pregnant women, as it can cause a premature delivery. CAUSES  Bacterial vaginosis is caused by an increase in harmful bacteria that are normally present in smaller amounts in the  vagina. Several different kinds of bacteria can cause bacterial vaginosis. However, the reason that the condition develops is not fully  understood. RISK FACTORS Certain activities or behaviors can put you at an increased risk of developing bacterial vaginosis, including:  Having a new sex partner or multiple sex partners.  Douching.  Using an intrauterine device (IUD) for contraception. Women do not get bacterial vaginosis from toilet seats, bedding, swimming pools, or contact with objects around them. SIGNS AND SYMPTOMS  Some women with bacterial vaginosis have no signs or symptoms. Common symptoms include:  Grey vaginal discharge.  A fishlike odor with discharge, especially after sexual intercourse.  Itching or burning of the vagina and vulva.  Burning or pain with urination. DIAGNOSIS  Your health care provider will take a medical history and examine the vagina for signs of bacterial vaginosis. A sample of vaginal fluid may be taken. Your health care provider will look at this sample under a microscope to check for bacteria and abnormal cells. A vaginal pH test may also be done.  TREATMENT  Bacterial vaginosis may be treated with antibiotic medicines. These may be given in the form of a pill or a vaginal cream. A second round of antibiotics may be prescribed if the condition comes back after treatment.  HOME CARE INSTRUCTIONS   Only take over-the-counter or prescription medicines as directed by your health care provider.  If antibiotic medicine was prescribed, take it as directed. Make sure you finish it even if you start to feel better.  Do not have sex until treatment is completed.  Tell all sexual partners that you have a vaginal infection. They should see their health care provider and be treated if they have problems, such as a mild rash or itching.  Practice safe sex by using condoms and only having one sex partner. SEEK MEDICAL CARE IF:   Your symptoms are not improving after 3 days of treatment.  You have increased discharge or pain.  You have a fever. MAKE SURE YOU:   Understand these  instructions.  Will watch your condition.  Will get help right away if you are not doing well or get worse. FOR MORE INFORMATION  Centers for Disease Control and Prevention, Division of STD Prevention: SolutionApps.co.zawww.cdc.gov/std American Sexual Health Association (ASHA): www.ashastd.org  Document Released: 10/25/2005 Document Revised: 08/15/2013 Document Reviewed: 06/06/2013 [facility] Patient Information 2015 [place], [place]. This information is not intended to replace advice given to you by your health care provider. Make sure you discuss any questions you have with your health care provider. Urinary Tract Infection Urinary tract infections (UTIs) can develop anywhere along your urinary tract. Your urinary tract is your body's drainage system for removing wastes and extra water. Your urinary tract includes two kidneys, two ureters, a bladder, and a urethra. Your kidneys are a pair of bean-shaped organs. Each kidney is about the size of your fist. They are located below your ribs, one on each side of your spine. CAUSES Infections are caused by microbes, which are microscopic organisms, including fungi, viruses, and bacteria. These organisms are so small that they can only be seen through a microscope. Bacteria are the microbes that most commonly cause UTIs. SYMPTOMS  Symptoms of UTIs may vary by age and gender of the patient and by the location of the infection. Symptoms in young women typically include a frequent and intense urge to urinate and a painful, burning feeling in the bladder or urethra during urination. Older women and men are more likely to  be tired, shaky, and weak and have muscle aches and abdominal pain. A fever may mean the infection is in your kidneys. Other symptoms of a kidney infection include pain in your back or sides below the ribs, nausea, and vomiting. DIAGNOSIS To diagnose a UTI, your caregiver will ask you about your symptoms. Your caregiver also will ask to provide a urine sample.  The urine sample will be tested for bacteria and white blood cells. White blood cells are made by your body to help fight infection. TREATMENT  Typically, UTIs can be treated with medication. Because most UTIs are caused by a bacterial infection, they usually can be treated with the use of antibiotics. The choice of antibiotic and length of treatment depend on your symptoms and the type of bacteria causing your infection. HOME CARE INSTRUCTIONS  If you were prescribed antibiotics, take them exactly as your caregiver instructs you. Finish the medication even if you feel better after you have only taken some of the medication.  Drink enough water and fluids to keep your urine clear or pale yellow.  Avoid caffeine, tea, and carbonated beverages. They tend to irritate your bladder.  Empty your bladder often. Avoid holding urine for long periods of time.  Empty your bladder before and after sexual intercourse.  After a bowel movement, women should cleanse from front to back. Use each tissue only once. SEEK MEDICAL CARE IF:   You have back pain.  You develop a fever.  Your symptoms do not begin to resolve within 3 days. SEEK IMMEDIATE MEDICAL CARE IF:   You have severe back pain or lower abdominal pain.  You develop chills.  You have nausea or vomiting.  You have continued burning or discomfort with urination. MAKE SURE YOU:   Understand these instructions.  Will watch your condition.  Will get help right away if you are not doing well or get worse. Document Released: 08/04/2005 Document Revised: 04/25/2012 Document Reviewed: 12/03/2011 [facility] Patient Information 2015 [place], [place]. This information is not intended to replace advice given to you by your health care provider. Make sure you discuss any questions you have with your health care provider.

## 2014-05-31 NOTE — MAU Note (Signed)
Patient states she has had lower abdominal pain and a vaginal odor for about one wee. Had a white discharge a few days ago but not now. States she has had BV in the past with the same symptoms.Period is late, on BCP's.

## 2014-05-31 NOTE — MAU Note (Signed)
Pt not in lobby x1 

## 2014-05-31 NOTE — MAU Provider Note (Signed)
History     CSN: [id number]  Arrival date and time: 05/31/14 1353   First Provider Initiated Contact with Patient 05/31/14 1527      Chief Complaint  Patient presents with  . Abdominal Pain   HPI Comments: Terry [name] 18 y.o. G1P0010 presents to MAU with what she thinks is bacterial vaginosis. She had chlamydia 2 months ago. She has same sexual partner. She is on BCPs.   Abdominal Pain      Past Medical History  Diagnosis Date  . No pertinent past medical history   . Gonorrhea   . Medical history non-contributory     Past Surgical History  Procedure Laterality Date  . No past surgeries      Family History  Problem Relation Age of Onset  . Diabetes Maternal Uncle   . Cancer Maternal Grandmother     pancreatic    History  Substance Use Topics  . Smoking status: Never Smoker   . Smokeless tobacco: Never Used  . Alcohol Use: No    Allergies: No Known Allergies  Prescriptions prior to admission  Medication Sig Dispense Refill  . azithromycin (ZITHROMAX) 250 MG tablet Take all 4 tablets at once for a total of 1000mg .  4 each  0  . ibuprofen (ADVIL,MOTRIN) 800 MG tablet Take 1 tablet (800 mg total) by mouth 3 (three) times daily.  21 tablet  0  . norgestimate-ethinyl estradiol (ORTHO-CYCLEN, 28,) 0.25-35 MG-MCG tablet Take 1 tablet by mouth daily.  1 Package  3  . promethazine (PHENERGAN) 25 MG tablet Take 1 tablet (25 mg total) by mouth once.  30 tablet  0  . traMADol (ULTRAM) 50 MG tablet Take 1 tablet (50 mg total) by mouth every 6 (six) hours as needed.  15 tablet  0    Review of Systems  Constitutional: Negative.   HENT: Negative.   Eyes: Negative.   Respiratory: Negative.   Cardiovascular: Negative.   Gastrointestinal: Positive for abdominal pain.  Genitourinary:       Bacterial vaginosis   Musculoskeletal: Negative.   Skin: Negative.   Neurological: Negative.   Psychiatric/Behavioral: Negative.    Physical Exam   Blood pressure 116/77,  pulse 105, temperature 97.9 F (36.6 C), temperature source Oral, resp. rate 12, height 5\' 10"  (1.778 m), weight 62.234 kg (137 lb 3.2 oz), last menstrual period 04/21/2014, SpO2 99.00%.  Physical Exam  Constitutional: She is oriented to person, place, and time. She appears well-developed and well-nourished. No distress.  HENT:  Head: Normocephalic and atraumatic.  Eyes: Conjunctivae are normal. Pupils are equal, round, and reactive to light.  GI: Soft. Bowel sounds are normal. She exhibits no distension. There is no tenderness. There is no rebound and no guarding.  Genitourinary:  Genital:external negative Vaginal:very small amount clear discharge Cervix:closed/ thick Bimanual:nontender/ no CMT   Musculoskeletal: Normal range of motion.  Neurological: She is alert and oriented to person, place, and time.  Skin: Skin is warm and dry.  Psychiatric: She has a normal mood and affect. Her behavior is normal. Judgment and thought content normal.   Results for orders placed during the hospital encounter of 05/31/14 (from the past 24 hour(s))  URINALYSIS, ROUTINE W REFLEX MICROSCOPIC     Status: Abnormal   Collection Time    05/31/14  2:05 PM      Result Value Ref Range   Color, Urine AMBER (*) YELLOW   APPearance CLEAR  CLEAR   Specific Gravity, Urine >1.030 (*) 1.005 - 1.030  pH 6.0  5.0 - 8.0   Glucose, UA NEGATIVE  NEGATIVE mg/dL   Hgb urine dipstick NEGATIVE  NEGATIVE   Bilirubin Urine SMALL (*) NEGATIVE   Ketones, ur 15 (*) NEGATIVE mg/dL   Protein, ur 161 (*) NEGATIVE mg/dL   Urobilinogen, UA 1.0  0.0 - 1.0 mg/dL   Nitrite NEGATIVE  NEGATIVE   Leukocytes, UA NEGATIVE  NEGATIVE  URINE MICROSCOPIC-ADD ON     Status: Abnormal   Collection Time    05/31/14  2:05 PM      Result Value Ref Range   Squamous Epithelial / LPF RARE  RARE   WBC, UA 3-6  <3 WBC/hpf   RBC / HPF 0-2  <3 RBC/hpf   Bacteria, UA MANY (*) RARE   Urine-Other MUCOUS PRESENT    POCT PREGNANCY, URINE      Status: None   Collection Time    05/31/14  3:14 PM      Result Value Ref Range   Preg Test, Ur NEGATIVE  NEGATIVE  WET PREP, GENITAL     Status: Abnormal   Collection Time    05/31/14  3:35 PM      Result Value Ref Range   Yeast Wet Prep HPF POC NONE SEEN  NONE SEEN   Trich, Wet Prep NONE SEEN  NONE SEEN   Clue Cells Wet Prep HPF POC FEW (*) NONE SEEN   WBC, Wet Prep HPF POC FEW (*) NONE SEEN     MAU Course  Procedures  MDM Wet prep, GC, Chlamydia, UA  Assessment and Plan  A: UTI Bacterial Vaginosis  P: Cipro 500 mg po bid x 3 days Culture urine Flagyl 500 mg po bid Diflucan 150 mg po Advised to find GYN and establish office care  Carolynn Serve 05/31/2014, 3:35 PM

## 2014-06-01 LAB — URINE CULTURE
Colony Count: 45000
Special Requests: NORMAL

## 2014-06-01 LAB — GC/CHLAMYDIA PROBE AMP
CT PROBE, AMP APTIMA: NEGATIVE
GC PROBE AMP APTIMA: NEGATIVE

## 2014-06-22 ENCOUNTER — Emergency Department (HOSPITAL_COMMUNITY): Payer: Medicaid Other

## 2014-06-22 ENCOUNTER — Emergency Department (HOSPITAL_COMMUNITY)
Admission: EM | Admit: 2014-06-22 | Discharge: 2014-06-22 | Disposition: A | Discharge status: Home / Self Care | Payer: Medicaid Other | Attending: Emergency Medicine | Admitting: Emergency Medicine

## 2014-06-22 ENCOUNTER — Encounter (HOSPITAL_COMMUNITY): Payer: Self-pay | Admitting: Emergency Medicine

## 2014-06-22 DIAGNOSIS — Z791 Long term (current) use of non-steroidal anti-inflammatories (NSAID): Secondary | ICD-10-CM | POA: Insufficient documentation

## 2014-06-22 DIAGNOSIS — IMO0002 Reserved for concepts with insufficient information to code with codable children: Secondary | ICD-10-CM | POA: Diagnosis not present

## 2014-06-22 DIAGNOSIS — Z8619 Personal history of other infectious and parasitic diseases: Secondary | ICD-10-CM | POA: Insufficient documentation

## 2014-06-22 DIAGNOSIS — R112 Nausea with vomiting, unspecified: Secondary | ICD-10-CM | POA: Insufficient documentation

## 2014-06-22 DIAGNOSIS — R109 Unspecified abdominal pain: Secondary | ICD-10-CM | POA: Diagnosis present

## 2014-06-22 DIAGNOSIS — N926 Irregular menstruation, unspecified: Secondary | ICD-10-CM | POA: Diagnosis not present

## 2014-06-22 DIAGNOSIS — N949 Unspecified condition associated with female genital organs and menstrual cycle: Secondary | ICD-10-CM | POA: Diagnosis not present

## 2014-06-22 DIAGNOSIS — Z3202 Encounter for pregnancy test, result negative: Secondary | ICD-10-CM | POA: Diagnosis not present

## 2014-06-22 DIAGNOSIS — R102 Pelvic and perineal pain: Secondary | ICD-10-CM

## 2014-06-22 DIAGNOSIS — Z79899 Other long term (current) drug therapy: Secondary | ICD-10-CM | POA: Diagnosis not present

## 2014-06-22 DIAGNOSIS — N939 Abnormal uterine and vaginal bleeding, unspecified: Secondary | ICD-10-CM | POA: Insufficient documentation

## 2014-06-22 LAB — URINALYSIS, ROUTINE W REFLEX MICROSCOPIC
BILIRUBIN URINE: NEGATIVE
Glucose, UA: NEGATIVE mg/dL
Ketones, ur: NEGATIVE mg/dL
Nitrite: NEGATIVE
PH: 5 (ref 5.0–8.0)
Protein, ur: 30 mg/dL — AB
Specific Gravity, Urine: 1.028 (ref 1.005–1.030)
UROBILINOGEN UA: 0.2 mg/dL (ref 0.0–1.0)

## 2014-06-22 LAB — COMPREHENSIVE METABOLIC PANEL
ALT: 13 U/L (ref 0–35)
AST: 17 U/L (ref 0–37)
Albumin: 4.2 g/dL (ref 3.5–5.2)
Alkaline Phosphatase: 72 U/L (ref 39–117)
Anion gap: 16 — ABNORMAL HIGH (ref 5–15)
BILIRUBIN TOTAL: 0.6 mg/dL (ref 0.3–1.2)
BUN: 9 mg/dL (ref 6–23)
CO2: 20 mEq/L (ref 19–32)
Calcium: 9.5 mg/dL (ref 8.4–10.5)
Chloride: 101 mEq/L (ref 96–112)
Creatinine, Ser: 0.68 mg/dL (ref 0.50–1.10)
GFR calc Af Amer: >90 mL/min (ref >90)
GFR calc non Af Amer: >90 mL/min (ref >90)
Glucose, Bld: 168 mg/dL — ABNORMAL HIGH (ref 70–99)
POTASSIUM: 4.1 meq/L (ref 3.7–5.3)
SODIUM: 137 meq/L (ref 137–147)
Total Protein: 7.6 g/dL (ref 6.0–8.3)

## 2014-06-22 LAB — CBC WITH DIFFERENTIAL/PLATELET
BASOS ABS: 0 10*3/uL (ref 0.0–0.1)
BASOS PCT: 0 % (ref 0–1)
Eosinophils Absolute: 0 10*3/uL (ref 0.0–0.7)
Eosinophils Relative: 0 % (ref 0–5)
HCT: 38.1 % (ref 36.0–46.0)
Hemoglobin: 12.8 g/dL (ref 12.0–15.0)
Lymphocytes Relative: 4 % — ABNORMAL LOW (ref 12–46)
Lymphs Abs: 0.7 10*3/uL (ref 0.7–4.0)
MCH: 32.1 pg (ref 26.0–34.0)
MCHC: 33.6 g/dL (ref 30.0–36.0)
MCV: 95.5 fL (ref 78.0–100.0)
Monocytes Absolute: 0.3 10*3/uL (ref 0.1–1.0)
Monocytes Relative: 2 % — ABNORMAL LOW (ref 3–12)
NEUTROS ABS: 14.6 10*3/uL — AB (ref 1.7–7.7)
NEUTROS PCT: 94 % — AB (ref 43–77)
Platelets: 267 10*3/uL (ref 150–400)
RBC: 3.99 MIL/uL (ref 3.87–5.11)
RDW: 11.9 % (ref 11.5–15.5)
WBC: 15.5 10*3/uL — ABNORMAL HIGH (ref 4.0–10.5)

## 2014-06-22 LAB — URINE MICROSCOPIC-ADD ON

## 2014-06-22 LAB — PREGNANCY, URINE: Preg Test, Ur: NEGATIVE

## 2014-06-22 LAB — WET PREP, GENITAL
CLUE CELLS WET PREP: NONE SEEN
TRICH WET PREP: NONE SEEN
Yeast Wet Prep HPF POC: NONE SEEN

## 2014-06-22 LAB — LIPASE, BLOOD: Lipase: 23 U/L (ref 11–59)

## 2014-06-22 MED ORDER — MORPHINE SULFATE 4 MG/ML IJ SOLN
4.0000 mg | Freq: Once | INTRAMUSCULAR | Status: AC
  Administered 2014-06-22: 4 mg via INTRAVENOUS
  Filled 2014-06-22: qty 1

## 2014-06-22 MED ORDER — IOHEXOL 300 MG/ML  SOLN
100.0000 mL | Freq: Once | INTRAMUSCULAR | Status: AC | PRN
  Administered 2014-06-22: 100 mL via INTRAVENOUS

## 2014-06-22 MED ORDER — SODIUM CHLORIDE 0.9 % IV BOLUS (SEPSIS)
1000.0000 mL | Freq: Once | INTRAVENOUS | Status: AC
  Administered 2014-06-22: 1000 mL via INTRAVENOUS

## 2014-06-22 MED ORDER — ONDANSETRON HCL 4 MG/2ML IJ SOLN
4.0000 mg | Freq: Once | INTRAMUSCULAR | Status: AC
  Administered 2014-06-22: 4 mg via INTRAVENOUS
  Filled 2014-06-22: qty 2

## 2014-06-22 MED ORDER — IOHEXOL 300 MG/ML  SOLN
50.0000 mL | Freq: Once | INTRAMUSCULAR | Status: AC | PRN
  Administered 2014-06-22: 50 mL via ORAL

## 2014-06-22 MED ORDER — HYDROCODONE-ACETAMINOPHEN 5-325 MG PO TABS: 2.0000 | ORAL_TABLET | ORAL | Status: DC | PRN

## 2014-06-22 MED ORDER — HYDROCODONE-ACETAMINOPHEN 5-325 MG PO TABS
2.0000 | ORAL_TABLET | Freq: Once | ORAL | Status: AC
  Administered 2014-06-22: 2 via ORAL
  Filled 2014-06-22: qty 2

## 2014-06-22 MED ORDER — KETOROLAC TROMETHAMINE 30 MG/ML IJ SOLN
30.0000 mg | Freq: Once | INTRAMUSCULAR | Status: AC
  Administered 2014-06-22: 30 mg via INTRAVENOUS
  Filled 2014-06-22: qty 1

## 2014-06-22 NOTE — ED Provider Notes (Signed)
CSN: [id number]     Arrival date & time 06/22/14  0940 History   First MD Initiated Contact with Patient 06/22/14 [id number]     Chief Complaint  Patient presents with  . Abdominal Cramping     (Consider location/radiation/quality/duration/timing/severity/associated sxs/prior Treatment) HPI Comments: Patient presents with severe lower abdominal cramping onset around 4:30 this morning with onset of menstrual cycle. Cramps are sharp and constant. She has been taking Tylenol without relief. Similar cramps with menstrual cycle in the past. Endorses 5 episodes of non-bilious emesis. She saw a streak of blood at the end of her last emesis. Denies any diarrhea or fever. Denies any dysuria or hematuria. Denies any vaginal bleeding or discharge. Does not think she is pregnant. No previous abdominal surgeries.  The history is provided by the patient. The history is limited by the condition of the patient.    Past Medical History  Diagnosis Date  . No pertinent past medical history   . Gonorrhea   . Medical history non-contributory    Past Surgical History  Procedure Laterality Date  . No past surgeries     Family History  Problem Relation Age of Onset  . Diabetes Maternal Uncle   . Cancer Maternal Grandmother     pancreatic   History  Substance Use Topics  . Smoking status: Never Smoker   . Smokeless tobacco: Never Used  . Alcohol Use: No   OB History   Grav Para Term Preterm Abortions TAB SAB Ect Mult Living   1    1  1    0     Review of Systems  Constitutional: Negative for fever, activity change and appetite change.  Respiratory: Negative for cough, chest tightness and shortness of breath.   Gastrointestinal: Positive for nausea, vomiting and abdominal pain.  Genitourinary: Positive for menstrual problem, pelvic pain and dyspareunia. Negative for dysuria, urgency, frequency, hematuria, vaginal bleeding and vaginal discharge.  Musculoskeletal: Negative for arthralgias and myalgias.   Neurological: Negative for dizziness, weakness and headaches.  A complete 10 system review of systems was obtained and all systems are negative except as noted in the HPI and PMH.      Allergies  Review of patient's allergies indicates no known allergies.  Home Medications   Prior to Admission medications   Medication Sig Start Date End Date Taking? Authorizing Provider  naproxen sodium (ANAPROX) 220 MG tablet Take 440 mg by mouth 2 (two) times daily with a meal.   Yes Historical Provider, MD  norgestimate-ethinyl estradiol (ORTHO-CYCLEN, 28,) 0.25-35 MG-MCG tablet Take 1 tablet by mouth daily. 03/27/14  Yes Delbert PhenixLinda M Barefoot, NP  traMADol (ULTRAM) 50 MG tablet Take 1 tablet (50 mg total) by mouth every 6 (six) hours as needed. 03/27/14  Yes Delbert PhenixLinda M Barefoot, NP  HYDROcodone-acetaminophen (NORCO/VICODIN) 5-325 MG per tablet Take 2 tablets by mouth every 4 (four) hours as needed. 06/22/14   Glynn OctaveStephen Yuri Flener, MD   BP 123/70  Pulse 104  Temp(Src) 97.7 F (36.5 C) (Oral)  Resp 15  SpO2 97%  LMP 06/22/2014 Physical Exam  Nursing note and vitals reviewed. Constitutional: She is oriented to person, place, and time. She appears well-developed and well-nourished. No distress.  Uncomfortable curled in fetal position  HENT:  Head: Normocephalic and atraumatic.  Mouth/Throat: Oropharynx is clear and moist. No oropharyngeal exudate.  Eyes: Conjunctivae and EOM are normal. Pupils are equal, round, and reactive to light.  Neck: Normal range of motion. Neck supple.  No meningismus.  Cardiovascular: Normal rate,  regular rhythm, normal heart sounds and intact distal pulses.   No murmur heard. Pulmonary/Chest: Effort normal and breath sounds normal. No respiratory distress.  Abdominal: Soft. There is tenderness. There is no rebound and no guarding.  Soft, no guarding or rebound   Genitourinary:  Chaperone present. Normal external genitalia. Dark blood in vaginal vault. No CMT. No adnexal  tenderness  Musculoskeletal: Normal range of motion. She exhibits no edema and no tenderness.  No peritoneal signs  Neurological: She is alert and oriented to person, place, and time. No cranial nerve deficit. She exhibits normal muscle tone. Coordination normal.  No ataxia on finger to nose bilaterally. No pronator drift. 5/5 strength throughout. CN 2-12 intact. Negative Romberg. Equal grip strength. Sensation intact. Gait is normal.   Skin: Skin is warm.  Psychiatric: She has a normal mood and affect. Her behavior is normal.    ED Course  Procedures (including critical care time) Labs Review Labs Reviewed  WET PREP, GENITAL - Abnormal; Notable for the following:    WBC, Wet Prep HPF POC FEW (*)    All other components within normal limits  URINALYSIS, ROUTINE W REFLEX MICROSCOPIC - Abnormal; Notable for the following:    Color, Urine AMBER (*)    APPearance TURBID (*)    Hgb urine dipstick LARGE (*)    Protein, ur 30 (*)    Leukocytes, UA SMALL (*)    All other components within normal limits  CBC WITH DIFFERENTIAL - Abnormal; Notable for the following:    WBC 15.5 (*)    Neutrophils Relative % 94 (*)    Neutro Abs 14.6 (*)    Lymphocytes Relative 4 (*)    Monocytes Relative 2 (*)    All other components within normal limits  COMPREHENSIVE METABOLIC PANEL - Abnormal; Notable for the following:    Glucose, Bld 168 (*)    Anion gap 16 (*)    All other components within normal limits  URINE MICROSCOPIC-ADD ON - Abnormal; Notable for the following:    Squamous Epithelial / LPF FEW (*)    Bacteria, UA FEW (*)    All other components within normal limits  GC/CHLAMYDIA PROBE AMP  PREGNANCY, URINE  LIPASE, BLOOD    Imaging Review Ct Abdomen Pelvis W Contrast  06/22/2014   CLINICAL DATA:  Elevated white blood cell count. Menstrual cramping and nausea and vomiting. Right-sided abdominal pain.  EXAM: CT ABDOMEN AND PELVIS WITH CONTRAST  TECHNIQUE: Multidetector CT imaging of the  abdomen and pelvis was performed using the standard protocol following bolus administration of intravenous contrast.  CONTRAST:  50mL OMNIPAQUE IOHEXOL 300 MG/ML SOLN, OMNIPAQUE IOHEXOL 300 MG/ML SOLN  COMPARISON:  None.  FINDINGS: Clear lung bases.  Heart is normal in size.  Liver, spleen, gallbladder, pancreas: Normal. No bile duct dilation. No adrenal masses. Normal kidneys, ureters and bladder. Uterus is unremarkable. Left ovary is prominent measuring 5.4 cm x 2.6 cm x 4.3 cm. This is likely from a physiologic cyst or cysts. Right ovary is unremarkable. No adnexal masses.  No adenopathy.  No ascites.  Bowel is unremarkable.  Normal appendix is visualized.  No bony abnormality.  IMPRESSION: 1. No convincing acute finding.  Normal appendix is visualized. 2. Left ovary is prominent. This is most likely physiologic. If there is left adnexal pain, consider further evaluation with pelvic ultrasound. 3. Study is otherwise normal.   Electronically Signed   By: Amie Portland M.D.   On: 06/22/2014 15:22  EKG Interpretation None      MDM   Final diagnoses:  Pelvic pain in female   Lower abdominal cramping with onset of menstrual cycle. Episodes of nausea and vomiting with blood-streaked emesis. Abdomen soft without peritoneal signs  Urinalysis appears contaminated. Pelvic exam is benign. Pain has resolved with medication in the ED. Hemoglobin stable. Leukocytosis of 15 noted.  Patient with no pain at McBurney's point. No right lower quadrant tenderness. CT without acute abnormality.  Appendix normal.   Patient tolerating by mouth in the ED. Instructed to not take Ultram more than prescribed. Followup with women's clinic. Return precautions given.  BP 123/70  Pulse 104  Temp(Src) 97.7 F (36.5 C) (Oral)  Resp 15  SpO2 97%  LMP 06/22/2014   Glynn Octave, MD 06/22/14 [phone]

## 2014-06-22 NOTE — Discharge Instructions (Signed)
Pelvic Pain Female pelvic pain can be caused by many different things and start from a variety of places. Pelvic pain refers to pain that is located in the lower half of the abdomen and between your hips. The pain may occur over a short period of time (acute) or may be reoccurring (chronic). The cause of pelvic pain may be related to disorders affecting the female reproductive organs (gynecologic), but it may also be related to the bladder, kidney stones, an intestinal complication, or muscle or skeletal problems. Getting help right away for pelvic pain is important, especially if there has been severe, sharp, or a sudden onset of unusual pain. It is also important to get help right away because some types of pelvic pain can be life threatening.  CAUSES  Below are only some of the causes of pelvic pain. The causes of pelvic pain can be in one of several categories.   Gynecologic.  Pelvic inflammatory disease.  Sexually transmitted infection.  Ovarian cyst or a twisted ovarian ligament (ovarian torsion).  Uterine lining that grows outside the uterus (endometriosis).  Fibroids, cysts, or tumors.  Ovulation.  Pregnancy.  Pregnancy that occurs outside the uterus (ectopic pregnancy).  Miscarriage.  Labor.  Abruption of the placenta or ruptured uterus.  Infection.  Uterine infection (endometritis).  Bladder infection.  Diverticulitis.  Miscarriage related to a uterine infection (septic abortion).  Bladder.  Inflammation of the bladder (cystitis).  Kidney stone(s).  Gastrointestinal.  Constipation.  Diverticulitis.  Neurologic.  Trauma.  Feeling pelvic pain because of mental or emotional causes (psychosomatic).  Cancers of the bowel or pelvis. EVALUATION  Your caregiver will want to take a careful history of your concerns. This includes recent changes in your health, a careful gynecologic history of your periods (menses), and a sexual history. Obtaining your family  history and medical history is also important. Your caregiver may suggest a pelvic exam. A pelvic exam will help identify the location and severity of the pain. It also helps in the evaluation of which organ system may be involved. In order to identify the cause of the pelvic pain and be properly treated, your caregiver may order tests. These tests may include:   A pregnancy test.  Pelvic ultrasonography.  An X-ray exam of the abdomen.  A urinalysis or evaluation of vaginal discharge.  Blood tests. HOME CARE INSTRUCTIONS   Only take over-the-counter or prescription medicines for pain, discomfort, or fever as directed by your caregiver.   Rest as directed by your caregiver.   Eat a balanced diet.   Drink enough fluids to make your urine clear or pale yellow, or as directed.   Avoid sexual intercourse if it causes pain.   Apply warm or cold compresses to the lower abdomen depending on which one helps the pain.   Avoid stressful situations.   Keep a journal of your pelvic pain. Write down when it started, where the pain is located, and if there are things that seem to be associated with the pain, such as food or your menstrual cycle.  Follow up with your caregiver as directed.  SEEK MEDICAL CARE IF:  Your medicine does not help your pain.  You have abnormal vaginal discharge. SEEK IMMEDIATE MEDICAL CARE IF:   You have heavy bleeding from the vagina.   Your pelvic pain increases.   You feel light-headed or faint.   You have chills.   You have pain with urination or blood in your urine.   You have uncontrolled diarrhea   or vomiting.   You have a fever or persistent symptoms for more than 3 days.  You have a fever and your symptoms suddenly get worse.   You are being physically or sexually abused.  MAKE SURE YOU:  Understand these instructions.  Will watch your condition.  Will get help if you are not doing well or get worse. Document Released:  09/21/2004 Document Revised: 03/11/2014 Document Reviewed: 02/14/2012 ExitCare Patient Information 2015 [employer]. This information is not intended to replace advice given to you by your health care provider. Make sure you discuss any questions you have with your health care provider.  

## 2014-06-22 NOTE — ED Notes (Signed)
Pt reports nausea and vomiting; normal BM this am.

## 2014-06-22 NOTE — ED Notes (Addendum)
Per EMS pt complaint of menstrual cramping starting this am with start of menstrual cycle. Pt prescribed tramadol for menstrual cramps and has taken four 50 mg tablets an hour ago without relief.

## 2014-06-22 NOTE — ED Notes (Signed)
Bed: [id number] Expected date: 06/22/14 Expected time: 9:36 AM Means of arrival: Ambulance Comments: Abd cramping

## 2014-06-22 NOTE — ED Notes (Signed)
Patient is unable to give urine sample at this time due to being in to much pain.

## 2014-06-22 NOTE — ED Notes (Signed)
Pt tolerated fluid challenge well and is asking for food.

## 2014-06-22 NOTE — ED Notes (Signed)
Pt was given a [place] sandwhich @ 11:00

## 2014-06-22 NOTE — ED Notes (Signed)
AVS explained in detail-knows not to take extra tylenol and not to drink alcohol/drive/operate heavy machinery while taking prescribed medications. No other questions/concerns. Ambulatory with steady gait.

## 2014-06-22 NOTE — ED Notes (Signed)
Patient transported to CT 

## 2014-06-24 LAB — GC/CHLAMYDIA PROBE AMP
CT PROBE, AMP APTIMA: NEGATIVE
GC PROBE AMP APTIMA: NEGATIVE

## 2014-09-09 ENCOUNTER — Encounter (HOSPITAL_COMMUNITY): Payer: Self-pay | Admitting: Emergency Medicine

## 2014-09-26 ENCOUNTER — Emergency Department (HOSPITAL_COMMUNITY)
Admission: EM | Admit: 2014-09-26 | Discharge: 2014-09-26 | Disposition: A | Discharge status: Home / Self Care | Payer: Medicaid Other | Attending: Emergency Medicine | Admitting: Emergency Medicine

## 2014-09-26 ENCOUNTER — Encounter (HOSPITAL_COMMUNITY): Payer: Self-pay | Admitting: Emergency Medicine

## 2014-09-26 DIAGNOSIS — Z79899 Other long term (current) drug therapy: Secondary | ICD-10-CM | POA: Diagnosis not present

## 2014-09-26 DIAGNOSIS — Z3202 Encounter for pregnancy test, result negative: Secondary | ICD-10-CM | POA: Insufficient documentation

## 2014-09-26 DIAGNOSIS — Z791 Long term (current) use of non-steroidal anti-inflammatories (NSAID): Secondary | ICD-10-CM | POA: Insufficient documentation

## 2014-09-26 DIAGNOSIS — B379 Candidiasis, unspecified: Secondary | ICD-10-CM

## 2014-09-26 DIAGNOSIS — R21 Rash and other nonspecific skin eruption: Secondary | ICD-10-CM | POA: Insufficient documentation

## 2014-09-26 DIAGNOSIS — N760 Acute vaginitis: Secondary | ICD-10-CM | POA: Insufficient documentation

## 2014-09-26 LAB — URINALYSIS, ROUTINE W REFLEX MICROSCOPIC
GLUCOSE, UA: NEGATIVE mg/dL
Hgb urine dipstick: NEGATIVE
KETONES UR: NEGATIVE mg/dL
Leukocytes, UA: NEGATIVE
Nitrite: NEGATIVE
Protein, ur: 30 mg/dL — AB
SPECIFIC GRAVITY, URINE: 1.026 (ref 1.005–1.030)
Urobilinogen, UA: 1 mg/dL (ref 0.0–1.0)
pH: 7 (ref 5.0–8.0)

## 2014-09-26 LAB — POC URINE PREG, ED: PREG TEST UR: NEGATIVE

## 2014-09-26 LAB — URINE MICROSCOPIC-ADD ON

## 2014-09-26 LAB — WET PREP, GENITAL
CLUE CELLS WET PREP: NONE SEEN
Trich, Wet Prep: NONE SEEN
WBC, Wet Prep HPF POC: NONE SEEN
Yeast Wet Prep HPF POC: NONE SEEN

## 2014-09-26 MED ORDER — DIPHENHYDRAMINE HCL 25 MG PO CAPS: 25.0000 mg | ORAL_CAPSULE | Freq: Four times a day (QID) | ORAL | Status: DC | PRN

## 2014-09-26 MED ORDER — PREDNISONE 20 MG PO TABS: 40.0000 mg | ORAL_TABLET | Freq: Every day | ORAL | Status: DC

## 2014-09-26 MED ORDER — FLUCONAZOLE 150 MG PO TABS: 150.0000 mg | ORAL_TABLET | Freq: Once | ORAL | Status: DC

## 2014-09-26 NOTE — ED Notes (Signed)
Patient states fine rash all over arms, torso, legs.  Patient states also has yeast infection that needs to be checked.  Denies urinary symptoms.   Patient also states abdominal pain.

## 2014-09-26 NOTE — Discharge Instructions (Signed)
Take the prescribed medication as directed. Follow-up with women's outpatient clinic. Return to the ED for new or worsening symptoms.

## 2014-09-26 NOTE — ED Provider Notes (Signed)
CSN: [id number]     Arrival date & time 09/26/14  1021 History   First MD Initiated Contact with Patient 09/26/14 1500     Chief Complaint  Patient presents with  . Vaginitis  . Rash     (Consider location/radiation/quality/duration/timing/severity/associated sxs/prior Treatment) Patient is a 18 y.o. female presenting with rash. The history is provided by the patient and medical records.  Rash  This is a 18 y.o. F with no significant PMH presenting to the ED with multiple complaints.  Patient states 3 days ago she developed a pruritic rash scattered across her bilateral arms and legs and torso.  She denies changes in soaps, detergents, or other personal care products.  No new medications.  No fever, chills, sweats.  No known insect bites.  No recent hotel stays, no one at home with similar rash.  Has been using OTC hydrocortisone cream without improvement.  Patient also states she thinks she has a yeast infection.  States she has been having some thick white vaginal discharge as well as vaginal itching.  She reported abdominal pain to triage staff but denied this to me.  Denies pelvic pain.  She denies new sexual partners or possibility of pregnancy.  Patient is currently on OCP's.  VS stable on arrival.  Past Medical History  Diagnosis Date  . No pertinent past medical history   . Gonorrhea   . Medical history non-contributory    Past Surgical History  Procedure Laterality Date  . No past surgeries     Family History  Problem Relation Age of Onset  . Diabetes Maternal Uncle   . Cancer Maternal Grandmother     pancreatic   History  Substance Use Topics  . Smoking status: Never Smoker   . Smokeless tobacco: Never Used  . Alcohol Use: No   OB History    Gravida Para Term Preterm AB TAB SAB Ectopic Multiple Living   1    1  1    0     Review of Systems  Genitourinary: Positive for vaginal discharge and vaginal pain (itching).  Skin: Positive for rash.  All other systems  reviewed and are negative.     Allergies  Review of patient's allergies indicates no known allergies.  Home Medications   Prior to Admission medications   Medication Sig Start Date End Date Taking? Authorizing Provider  acetaminophen (TYLENOL) 325 MG tablet Take 650 mg by mouth every 6 (six) hours as needed for mild pain or moderate pain.   Yes Historical Provider, MD  naproxen sodium (ANAPROX) 220 MG tablet Take 440 mg by mouth 2 (two) times daily with a meal.   Yes Historical Provider, MD  norgestimate-ethinyl estradiol (ORTHO-CYCLEN, 28,) 0.25-35 MG-MCG tablet Take 1 tablet by mouth daily. 03/27/14  Yes Delbert PhenixLinda M Barefoot, NP  HYDROcodone-acetaminophen (NORCO/VICODIN) 5-325 MG per tablet Take 2 tablets by mouth every 4 (four) hours as needed. Patient not taking: Reported on 09/26/2014 06/22/14   Glynn OctaveStephen Rancour, MD  traMADol (ULTRAM) 50 MG tablet Take 1 tablet (50 mg total) by mouth every 6 (six) hours as needed. Patient not taking: Reported on 09/26/2014 03/27/14   Doralee AlbinoLinda M Barefoot, NP   BP 114/58 mmHg  Pulse 70  Temp(Src) 98.6 F (37 C) (Oral)  Resp 18  Ht 5\' 10"  (1.778 m)  Wt 140 lb (63.504 kg)  BMI 20.09 kg/m2  SpO2 95%  LMP 09/11/2014   Physical Exam  Constitutional: She is oriented to person, place, and time. She appears well-developed  and well-nourished. No distress.  HENT:  Head: Normocephalic and atraumatic.  Mouth/Throat: Oropharynx is clear and moist.  Eyes: Conjunctivae and EOM are normal. Pupils are equal, round, and reactive to light.  Neck: Normal range of motion. Neck supple.  Cardiovascular: Normal rate, regular rhythm and normal heart sounds.   Pulmonary/Chest: Effort normal and breath sounds normal. No respiratory distress.  Abdominal: Soft. Bowel sounds are normal. There is no tenderness. There is no guarding.  Genitourinary: There is no tenderness or lesion on the right labia. There is no tenderness or lesion on the left labia. Cervix exhibits no motion  tenderness. Right adnexum displays no tenderness. Left adnexum displays no tenderness. No bleeding in the vagina. No foreign body around the vagina. Vaginal discharge found.  Normal female external genitalia without visible lesions; thick, curd-like vaginal discharge present in vaginal vault; no bleeding or FB; no adnexal or CMT  Musculoskeletal: Normal range of motion.  Neurological: She is alert and oriented to person, place, and time.  Skin: Skin is warm and dry. Rash noted. Rash is maculopapular. She is not diaphoretic.  Scattered, pruritic maculopapular rash across BUE, BLE, and torso; no surrounding erythema, induration, or signs of cellulitis; no drainage  Psychiatric: She has a normal mood and affect.  Nursing note and vitals reviewed.   ED Course  Procedures (including critical care time) Labs Review Labs Reviewed  URINALYSIS, ROUTINE W REFLEX MICROSCOPIC - Abnormal; Notable for the following:    Bilirubin Urine SMALL (*)    Protein, ur 30 (*)    All other components within normal limits  URINE MICROSCOPIC-ADD ON - Abnormal; Notable for the following:    Squamous Epithelial / LPF FEW (*)    Bacteria, UA FEW (*)    All other components within normal limits  WET PREP, GENITAL  GC/CHLAMYDIA PROBE AMP  POC URINE PREG, ED    Imaging Review No results found.   EKG Interpretation None      MDM   Final diagnoses:  Rash  Yeast infection   18 year old female with rash and vaginal discharge. She reported abdominal pain to triage nurse but denied this to me. On exam, patient does have a scattered, pruritic maculopapular rash across bilateral upper and lower extremities and torso. There are no signs of superimposed infection. Abdominal exam is benign. Urine pregnancy negative. UA noninfectious. Pelvic exam with thick, curd-like vaginal discharge consistent with yeast infection. No adnexal or CMT to suggest PID, TOA, or other acute pelvic pathology.  Wet prep negative, however do  feel the patient needs treatment for yeast infection. Gonorrhea/Chlamydia swab pending. Patient will be treated with Benadryl and prednisone for her rash as well as Diflucan for yeast infection. [facility]  Discussed plan with patient, he/she acknowledged understanding and agreed with plan of care.  Return precautions given for new or worsening symptoms.  Garlon HatchetLisa M Savaya Hakes, PA-C 09/26/14 2002  Layla MawKristen N Ward, DO 09/26/14 2047

## 2014-09-27 LAB — GC/CHLAMYDIA PROBE AMP
CT Probe RNA: NEGATIVE
GC PROBE AMP APTIMA: NEGATIVE

## 2014-10-21 ENCOUNTER — Encounter (HOSPITAL_COMMUNITY): Payer: Self-pay | Admitting: *Deleted

## 2014-10-21 ENCOUNTER — Inpatient Hospital Stay (HOSPITAL_COMMUNITY)
Admission: AD | Admit: 2014-10-21 | Discharge: 2014-10-21 | Disposition: A | Discharge status: Home / Self Care | Payer: Medicaid Other | Source: Ambulatory Visit | Attending: Family Medicine | Admitting: Family Medicine

## 2014-10-21 DIAGNOSIS — N949 Unspecified condition associated with female genital organs and menstrual cycle: Secondary | ICD-10-CM | POA: Insufficient documentation

## 2014-10-21 DIAGNOSIS — N760 Acute vaginitis: Secondary | ICD-10-CM | POA: Insufficient documentation

## 2014-10-21 DIAGNOSIS — N940 Mittelschmerz: Secondary | ICD-10-CM

## 2014-10-21 DIAGNOSIS — B9689 Other specified bacterial agents as the cause of diseases classified elsewhere: Secondary | ICD-10-CM | POA: Insufficient documentation

## 2014-10-21 DIAGNOSIS — N898 Other specified noninflammatory disorders of vagina: Secondary | ICD-10-CM | POA: Diagnosis present

## 2014-10-21 DIAGNOSIS — A499 Bacterial infection, unspecified: Secondary | ICD-10-CM

## 2014-10-21 LAB — URINALYSIS, ROUTINE W REFLEX MICROSCOPIC
BILIRUBIN URINE: NEGATIVE
Glucose, UA: NEGATIVE mg/dL
HGB URINE DIPSTICK: NEGATIVE
KETONES UR: NEGATIVE mg/dL
Leukocytes, UA: NEGATIVE
NITRITE: NEGATIVE
Protein, ur: NEGATIVE mg/dL
SPECIFIC GRAVITY, URINE: 1.02 (ref 1.005–1.030)
UROBILINOGEN UA: 1 mg/dL (ref 0.0–1.0)
pH: 7.5 (ref 5.0–8.0)

## 2014-10-21 LAB — CBC
HCT: 40.2 % (ref 36.0–46.0)
Hemoglobin: 13.4 g/dL (ref 12.0–15.0)
MCH: 33.1 pg (ref 26.0–34.0)
MCHC: 33.3 g/dL (ref 30.0–36.0)
MCV: 99.3 fL (ref 78.0–100.0)
PLATELETS: 301 10*3/uL (ref 150–400)
RBC: 4.05 MIL/uL (ref 3.87–5.11)
RDW: 12.5 % (ref 11.5–15.5)
WBC: 8.4 10*3/uL (ref 4.0–10.5)

## 2014-10-21 LAB — WET PREP, GENITAL
Trich, Wet Prep: NONE SEEN
Yeast Wet Prep HPF POC: NONE SEEN

## 2014-10-21 LAB — POCT PREGNANCY, URINE: Preg Test, Ur: NEGATIVE

## 2014-10-21 MED ORDER — METRONIDAZOLE 500 MG PO TABS: 500.0000 mg | ORAL_TABLET | Freq: Two times a day (BID) | ORAL | Status: DC

## 2014-10-21 MED ORDER — NORGESTIMATE-ETH ESTRADIOL 0.25-35 MG-MCG PO TABS: 1.0000 | ORAL_TABLET | Freq: Every day | ORAL | Status: DC

## 2014-10-21 NOTE — MAU Note (Signed)
Patient presents with complaint of vomiting, a yeast infection / bacterial vaginitis and pelvic pain. States she is not pregnant and denies bleeding.

## 2014-10-21 NOTE — Discharge Instructions (Signed)
Bacterial Vaginosis Bacterial vaginosis is a vaginal infection that occurs when the normal balance of bacteria in the vagina is disrupted. It results from an overgrowth of certain bacteria. This is the most common vaginal infection in women of childbearing age. Treatment is important to prevent complications, especially in pregnant women, as it can cause a premature delivery. CAUSES  Bacterial vaginosis is caused by an increase in harmful bacteria that are normally present in smaller amounts in the vagina. Several different kinds of bacteria can cause bacterial vaginosis. However, the reason that the condition develops is not fully understood. RISK FACTORS Certain activities or behaviors can put you at an increased risk of developing bacterial vaginosis, including:  Having a new sex partner or multiple sex partners.  Douching.  Using an intrauterine device (IUD) for contraception. Women do not get bacterial vaginosis from toilet seats, bedding, swimming pools, or contact with objects around them. SIGNS AND SYMPTOMS  Some women with bacterial vaginosis have no signs or symptoms. Common symptoms include:  Grey vaginal discharge.  A fishlike odor with discharge, especially after sexual intercourse.  Itching or burning of the vagina and vulva.  Burning or pain with urination. DIAGNOSIS  Your health care provider will take a medical history and examine the vagina for signs of bacterial vaginosis. A sample of vaginal fluid may be taken. Your health care provider will look at this sample under a microscope to check for bacteria and abnormal cells. A vaginal pH test may also be done.  TREATMENT  Bacterial vaginosis may be treated with antibiotic medicines. These may be given in the form of a pill or a vaginal cream. A second round of antibiotics may be prescribed if the condition comes back after treatment.  HOME CARE INSTRUCTIONS   Only take over-the-counter or prescription medicines as  directed by your health care provider.  If antibiotic medicine was prescribed, take it as directed. Make sure you finish it even if you start to feel better.  Do not have sex until treatment is completed.  Tell all sexual partners that you have a vaginal infection. They should see their health care provider and be treated if they have problems, such as a mild rash or itching.  Practice safe sex by using condoms and only having one sex partner. SEEK MEDICAL CARE IF:   Your symptoms are not improving after 3 days of treatment.  You have increased discharge or pain.  You have a fever. MAKE SURE YOU:   Understand these instructions.  Will watch your condition.  Will get help right away if you are not doing well or get worse. FOR MORE INFORMATION  Centers for Disease Control and Prevention, Division of STD Prevention: www.cdc.gov/std American Sexual Health Association (ASHA): www.ashastd.org  Document Released: 10/25/2005 Document Revised: 08/15/2013 Document Reviewed: 06/06/2013 ExitCare Patient Information 2015 [employer]. This information is not intended to replace advice given to you by your health care provider. Make sure you discuss any questions you have with your health care provider.  

## 2014-10-21 NOTE — MAU Provider Note (Signed)
Chief Complaint: Vaginitis; Pelvic Pain; and Emesis   First Provider Initiated Contact with Patient 10/21/14 1613     SUBJECTIVE HPI: Terry [name] is a 18 y.o. G1P0010 who presents to maternity admissions reporting vaginal discharge and pelvic pain x 1 week.  She reports she vomited x 1 the day the pelvic pain started but she has not had n/v since. She describes the pain as sharp and across her entire lower abdomen on both sides.  Pt ran out of OCPs 3 months ago and has not been taking them.  She has been using condoms during intercourse.  Patient's last menstrual period was 10/06/2014 (exact date).She denies vaginal bleeding, vaginal itching/burning, urinary symptoms, h/a, dizziness, n/v, or fever/chills.   Past Medical History  Diagnosis Date  . No pertinent past medical history   . Gonorrhea    Past Surgical History  Procedure Laterality Date  . No past surgeries     History   Social History  . Marital Status: Single    Spouse Name: N/A    Number of Children: N/A  . Years of Education: N/A   Occupational History  . Not on file.   Social History Main Topics  . Smoking status: Never Smoker   . Smokeless tobacco: Never Used  . Alcohol Use: No  . Drug Use: No  . Sexual Activity: Yes    Birth Control/ Protection: Condom     Comment: 1 partner X 2 years   Other Topics Concern  . Not on file   Social History Narrative   No current facility-administered medications on file prior to encounter.   Current Outpatient Prescriptions on File Prior to Encounter  Medication Sig Dispense Refill  . acetaminophen (TYLENOL) 325 MG tablet Take 650 mg by mouth every 6 (six) hours as needed for mild pain or moderate pain.    . diphenhydrAMINE (BENADRYL) 25 mg capsule Take 1 capsule (25 mg total) by mouth every 6 (six) hours as needed. 30 capsule 0  . fluconazole (DIFLUCAN) 150 MG tablet Take 1 tablet (150 mg total) by mouth once. Repeat in 72 hours if still having symptoms. (Patient not  taking: Reported on 10/21/2014) 2 tablet 0  . HYDROcodone-acetaminophen (NORCO/VICODIN) 5-325 MG per tablet Take 2 tablets by mouth every 4 (four) hours as needed. (Patient not taking: Reported on 09/26/2014) 10 tablet 0  . predniSONE (DELTASONE) 20 MG tablet Take 2 tablets (40 mg total) by mouth daily. Take 40 mg by mouth daily for 3 days, then 20mg  by mouth daily for 3 days, then 10mg  daily for 3 days (Patient not taking: Reported on 10/21/2014) 12 tablet 0  . traMADol (ULTRAM) 50 MG tablet Take 1 tablet (50 mg total) by mouth every 6 (six) hours as needed. (Patient not taking: Reported on 09/26/2014) 15 tablet 0   No Known Allergies  ROS: Pertinent items in HPI  OBJECTIVE Blood pressure 111/95, pulse 89, temperature 98.3 F (36.8 C), temperature source Oral, resp. rate 16, height 5\' 10"  (1.778 m), weight 64.071 kg (141 lb 4 oz), last menstrual period 10/06/2014. GENERAL: Well-developed, well-nourished female in no acute distress.  HEENT: Normocephalic HEART: normal rate RESP: normal effort ABDOMEN: Soft, non-tender EXTREMITIES: Nontender, no edema NEURO: Alert and oriented Pelvic exam: Cervix pink, visually closed, without lesion, moderate white creamy discharge, vaginal walls and external genitalia normal Bimanual exam: Cervix 0/long/high, firm, anterior, neg CMT, uterus nontender, nonenlarged, adnexa without tenderness, enlargement, or mass  LAB RESULTS Results for orders placed or performed during the  hospital encounter of 10/21/14 (from the past 24 hour(s))  Urinalysis, Routine w reflex microscopic     Status: None   Collection Time: 10/21/14  2:27 PM  Result Value Ref Range   Color, Urine YELLOW YELLOW   APPearance CLEAR CLEAR   Specific Gravity, Urine 1.020 1.005 - 1.030   pH 7.5 5.0 - 8.0   Glucose, UA NEGATIVE NEGATIVE mg/dL   Hgb urine dipstick NEGATIVE NEGATIVE   Bilirubin Urine NEGATIVE NEGATIVE   Ketones, ur NEGATIVE NEGATIVE mg/dL   Protein, ur NEGATIVE NEGATIVE  mg/dL   Urobilinogen, UA 1.0 0.0 - 1.0 mg/dL   Nitrite NEGATIVE NEGATIVE   Leukocytes, UA NEGATIVE NEGATIVE  Pregnancy, urine POC     Status: None   Collection Time: 10/21/14  2:27 PM  Result Value Ref Range   Preg Test, Ur NEGATIVE NEGATIVE  Wet prep, genital     Status: Abnormal   Collection Time: 10/21/14  4:15 PM  Result Value Ref Range   Yeast Wet Prep HPF POC NONE SEEN NONE SEEN   Trich, Wet Prep NONE SEEN NONE SEEN   Clue Cells Wet Prep HPF POC FEW (A) NONE SEEN   WBC, Wet Prep HPF POC FEW (A) NONE SEEN  CBC     Status: None   Collection Time: 10/21/14  4:35 PM  Result Value Ref Range   WBC 8.4 4.0 - 10.5 K/uL   RBC 4.05 3.87 - 5.11 MIL/uL   Hemoglobin 13.4 12.0 - 15.0 g/dL   HCT 69.640.2 29.536.0 - 28.446.0 %   MCV 99.3 78.0 - 100.0 fL   MCH 33.1 26.0 - 34.0 pg   MCHC 33.3 30.0 - 36.0 g/dL   RDW 13.212.5 44.011.5 - 10.215.5 %   Platelets 301 150 - 400 K/uL    ASSESSMENT 1. BV (bacterial vaginosis)   2. Ovulatory pain     PLAN Discharge home Flagyl 500 mg BID x 7 days Renewed Rx for OCPs  Follow-up Information    Please follow up.   Why:  Your primary care provider as needed      [facility]   Why:  As needed for emergencies   Contact information:   [address] [id number] [place]c [place] [id number] [phone]      Sharen CounterLisa Leftwich-Kirby Certified Nurse-Midwife 10/21/2014  5:47 PM

## 2014-10-22 LAB — GC/CHLAMYDIA PROBE AMP
CT PROBE, AMP APTIMA: NEGATIVE
GC PROBE AMP APTIMA: NEGATIVE

## 2014-10-22 LAB — HIV ANTIBODY (ROUTINE TESTING W REFLEX): HIV 1&2 Ab, 4th Generation: NONREACTIVE

## 2014-11-07 ENCOUNTER — Emergency Department (HOSPITAL_COMMUNITY)
Admission: EM | Admit: 2014-11-07 | Discharge: 2014-11-07 | Disposition: A | Discharge status: Home / Self Care | Payer: Medicaid Other | Attending: Emergency Medicine | Admitting: Emergency Medicine

## 2014-11-07 ENCOUNTER — Encounter (HOSPITAL_COMMUNITY): Payer: Self-pay | Admitting: Family Medicine

## 2014-11-07 ENCOUNTER — Emergency Department (HOSPITAL_COMMUNITY): Payer: Medicaid Other

## 2014-11-07 DIAGNOSIS — Z79899 Other long term (current) drug therapy: Secondary | ICD-10-CM | POA: Diagnosis not present

## 2014-11-07 DIAGNOSIS — R1030 Lower abdominal pain, unspecified: Secondary | ICD-10-CM

## 2014-11-07 DIAGNOSIS — R109 Unspecified abdominal pain: Secondary | ICD-10-CM

## 2014-11-07 DIAGNOSIS — R112 Nausea with vomiting, unspecified: Secondary | ICD-10-CM | POA: Insufficient documentation

## 2014-11-07 DIAGNOSIS — R1031 Right lower quadrant pain: Secondary | ICD-10-CM | POA: Diagnosis present

## 2014-11-07 DIAGNOSIS — Z792 Long term (current) use of antibiotics: Secondary | ICD-10-CM | POA: Insufficient documentation

## 2014-11-07 DIAGNOSIS — Z3202 Encounter for pregnancy test, result negative: Secondary | ICD-10-CM | POA: Insufficient documentation

## 2014-11-07 DIAGNOSIS — Z8619 Personal history of other infectious and parasitic diseases: Secondary | ICD-10-CM | POA: Diagnosis not present

## 2014-11-07 LAB — URINALYSIS, ROUTINE W REFLEX MICROSCOPIC
BILIRUBIN URINE: NEGATIVE
Glucose, UA: NEGATIVE mg/dL
Ketones, ur: 15 mg/dL — AB
Leukocytes, UA: NEGATIVE
NITRITE: NEGATIVE
Protein, ur: NEGATIVE mg/dL
SPECIFIC GRAVITY, URINE: 1.026 (ref 1.005–1.030)
UROBILINOGEN UA: 0.2 mg/dL (ref 0.0–1.0)
pH: 7.5 (ref 5.0–8.0)

## 2014-11-07 LAB — URINE MICROSCOPIC-ADD ON

## 2014-11-07 LAB — COMPREHENSIVE METABOLIC PANEL
ALBUMIN: 4.2 g/dL (ref 3.5–5.2)
ALK PHOS: 67 U/L (ref 39–117)
ALT: 16 U/L (ref 0–35)
AST: 24 U/L (ref 0–37)
Anion gap: 7 (ref 5–15)
BILIRUBIN TOTAL: 0.7 mg/dL (ref 0.3–1.2)
BUN: 7 mg/dL (ref 6–23)
CO2: 25 mmol/L (ref 19–32)
CREATININE: 0.75 mg/dL (ref 0.50–1.10)
Calcium: 9.5 mg/dL (ref 8.4–10.5)
Chloride: 103 mEq/L (ref 96–112)
GFR calc Af Amer: >90 mL/min (ref >90)
GFR calc non Af Amer: >90 mL/min (ref >90)
GLUCOSE: 131 mg/dL — AB (ref 70–99)
POTASSIUM: 3.8 mmol/L (ref 3.5–5.1)
SODIUM: 135 mmol/L (ref 135–145)
Total Protein: 7.2 g/dL (ref 6.0–8.3)

## 2014-11-07 LAB — CBC WITH DIFFERENTIAL/PLATELET
Basophils Absolute: 0 10*3/uL (ref 0.0–0.1)
Basophils Relative: 0 % (ref 0–1)
Eosinophils Absolute: 0 10*3/uL (ref 0.0–0.7)
Eosinophils Relative: 0 % (ref 0–5)
HCT: 38.7 % (ref 36.0–46.0)
HEMOGLOBIN: 13.4 g/dL (ref 12.0–15.0)
LYMPHS PCT: 5 % — AB (ref 12–46)
Lymphs Abs: 0.6 10*3/uL — ABNORMAL LOW (ref 0.7–4.0)
MCH: 33.7 pg (ref 26.0–34.0)
MCHC: 34.6 g/dL (ref 30.0–36.0)
MCV: 97.2 fL (ref 78.0–100.0)
MONO ABS: 0.2 10*3/uL (ref 0.1–1.0)
MONOS PCT: 1 % — AB (ref 3–12)
NEUTROS ABS: 12.3 10*3/uL — AB (ref 1.7–7.7)
Neutrophils Relative %: 94 % — ABNORMAL HIGH (ref 43–77)
Platelets: 311 10*3/uL (ref 150–400)
RBC: 3.98 MIL/uL (ref 3.87–5.11)
RDW: 11.8 % (ref 11.5–15.5)
WBC: 13 10*3/uL — AB (ref 4.0–10.5)

## 2014-11-07 LAB — PREGNANCY, URINE: Preg Test, Ur: NEGATIVE

## 2014-11-07 LAB — POC URINE PREG, ED: Preg Test, Ur: NEGATIVE

## 2014-11-07 MED ORDER — SODIUM CHLORIDE 0.9 % IV BOLUS (SEPSIS)
1000.0000 mL | Freq: Once | INTRAVENOUS | Status: AC
  Administered 2014-11-07: 1000 mL via INTRAVENOUS

## 2014-11-07 MED ORDER — PROMETHAZINE HCL 25 MG PO TABS: 25.0000 mg | ORAL_TABLET | Freq: Four times a day (QID) | ORAL | Status: DC | PRN

## 2014-11-07 MED ORDER — HYDROMORPHONE HCL 1 MG/ML IJ SOLN
1.0000 mg | Freq: Once | INTRAMUSCULAR | Status: AC
  Administered 2014-11-07: 1 mg via INTRAVENOUS
  Filled 2014-11-07: qty 1

## 2014-11-07 MED ORDER — ONDANSETRON 4 MG PO TBDP
8.0000 mg | ORAL_TABLET | Freq: Once | ORAL | Status: AC
  Administered 2014-11-07: 8 mg via ORAL
  Filled 2014-11-07: qty 2

## 2014-11-07 MED ORDER — ONDANSETRON HCL 4 MG/2ML IJ SOLN
4.0000 mg | Freq: Once | INTRAMUSCULAR | Status: AC
  Administered 2014-11-07: 4 mg via INTRAVENOUS
  Filled 2014-11-07: qty 2

## 2014-11-07 MED ORDER — KETOROLAC TROMETHAMINE 30 MG/ML IJ SOLN
30.0000 mg | Freq: Once | INTRAMUSCULAR | Status: AC
  Administered 2014-11-07: 30 mg via INTRAVENOUS
  Filled 2014-11-07: qty 1

## 2014-11-07 NOTE — ED Notes (Signed)
Charge tech contacted to coordinate tech to take patient to [facility].

## 2014-11-07 NOTE — ED Notes (Signed)
Pt states that she cannot give a urine sample at this time.  

## 2014-11-07 NOTE — ED Provider Notes (Signed)
CSN: [id number]     Arrival date & time 11/07/14  1357 History   First MD Initiated Contact with Patient 11/07/14 1601     Chief Complaint  Patient presents with  . Abdominal Pain     (Consider location/radiation/quality/duration/timing/severity/associated sxs/prior Treatment) Patient is a 18 y.o. female presenting with abdominal pain. The history is provided by the patient. No language interpreter was used.  Abdominal Pain Pain location:  RLQ and LLQ Pain quality: sharp   Pain radiates to:  Does not radiate Pain severity:  Severe Onset quality:  Gradual Duration:  3 days Timing:  Constant Progression:  Worsening Chronicity:  New Relieved by:  Nothing Worsened by:  Nothing tried Ineffective treatments:  Acetaminophen Associated symptoms: nausea and vomiting   Associated symptoms: no fever and no vaginal discharge     Past Medical History  Diagnosis Date  . No pertinent past medical history   . Gonorrhea    Past Surgical History  Procedure Laterality Date  . No past surgeries     Family History  Problem Relation Age of Onset  . Diabetes Maternal Uncle   . Cancer Maternal Grandmother     pancreatic   History  Substance Use Topics  . Smoking status: Never Smoker   . Smokeless tobacco: Never Used  . Alcohol Use: No   OB History    Gravida Para Term Preterm AB TAB SAB Ectopic Multiple Living   1    1  1    0     Review of Systems  Constitutional: Negative for fever.  Gastrointestinal: Positive for nausea, vomiting and abdominal pain.  Genitourinary: Negative for vaginal discharge.  All other systems reviewed and are negative.     Allergies  Review of patient's allergies indicates no known allergies.  Home Medications   Prior to Admission medications   Medication Sig Start Date End Date Taking? Authorizing Provider  acetaminophen (TYLENOL) 325 MG tablet Take 650 mg by mouth every 6 (six) hours as needed for mild pain or moderate pain.   Yes Historical  Provider, MD  norgestimate-ethinyl estradiol (ORTHO-CYCLEN, 28,) 0.25-35 MG-MCG tablet Take 1 tablet by mouth daily. 10/21/14  Yes Lisa A Leftwich-Kirby, CNM  metroNIDAZOLE (FLAGYL) 500 MG tablet Take 1 tablet (500 mg total) by mouth 2 (two) times daily. 10/21/14   Misty StanleyLisa A Leftwich-Kirby, CNM   BP 113/76 mmHg  Pulse 75  Temp(Src) 97.7 F (36.5 C) (Oral)  Resp 18  Ht 5\' 10"  (1.778 m)  Wt 146 lb (66.225 kg)  BMI 20.95 kg/m2  SpO2 100%  LMP 10/06/2014 (Exact Date) Physical Exam  Constitutional: She is oriented to person, place, and time. She appears well-developed and well-nourished.  Uncomfortable appearing  HENT:  Head: Normocephalic and atraumatic.  Cardiovascular: Normal rate and regular rhythm.   No murmur heard. Pulmonary/Chest: Effort normal and breath sounds normal. No respiratory distress.  Abdominal: Soft. There is no rebound and no guarding.  Mild to moderate lower abdominal tenderness  Musculoskeletal: She exhibits no edema or tenderness.  Neurological: She is alert and oriented to person, place, and time.  Skin: Skin is warm and dry.  Psychiatric: She has a normal mood and affect. Her behavior is normal.  Nursing note and vitals reviewed.   ED Course  Procedures (including critical care time) Labs Review Labs Reviewed  CBC WITH DIFFERENTIAL - Abnormal; Notable for the following:    WBC 13.0 (*)    Neutrophils Relative % 94 (*)    Neutro Abs 12.3 (*)  Lymphocytes Relative 5 (*)    Lymphs Abs 0.6 (*)    Monocytes Relative 1 (*)    All other components within normal limits  COMPREHENSIVE METABOLIC PANEL - Abnormal; Notable for the following:    Glucose, Bld 131 (*)    All other components within normal limits  URINALYSIS, ROUTINE W REFLEX MICROSCOPIC - Abnormal; Notable for the following:    Hgb urine dipstick MODERATE (*)    Ketones, ur 15 (*)    All other components within normal limits  URINE MICROSCOPIC-ADD ON - Abnormal; Notable for the following:     Bacteria, UA FEW (*)    All other components within normal limits  PREGNANCY, URINE  POC URINE PREG, ED    Imaging Review [place] Transvaginal Non-ob  11/07/2014   CLINICAL DATA:  Initial encounter for low abdominal pain  EXAM: TRANSABDOMINAL AND TRANSVAGINAL ULTRASOUND OF PELVIS  TECHNIQUE: Both transabdominal and transvaginal ultrasound examinations of the pelvis were performed. Transabdominal technique was performed for global imaging of the pelvis including uterus, ovaries, adnexal regions, and pelvic cul-de-sac. It was necessary to proceed with endovaginal exam following the transabdominal exam to visualize the ovaries.  COMPARISON:  CT scan from 06/22/2014.  FINDINGS: Uterus  Measurements: 7.0 x 3.6 x 4.0 cm. No fibroids or other mass visualized.  Endometrium  Thickness: 3 mm.  No focal abnormality visualized.  Right ovary  Measurements: 3.9 x 1.4 x 2.3 cm. Normal appearance/no adnexal mass.  Left ovary  Measurements: 4.7 x 2.4 x 2.3 cm. Normal appearance/no adnexal mass.  Other findings  No free fluid.  IMPRESSION: Normal exam.   Electronically Signed   By: Kennith CenterEric  Mansell M.D.   On: 11/07/2014 20:38   [place] Pelvis Complete  11/07/2014   CLINICAL DATA:  Initial encounter for low abdominal pain  EXAM: TRANSABDOMINAL AND TRANSVAGINAL ULTRASOUND OF PELVIS  TECHNIQUE: Both transabdominal and transvaginal ultrasound examinations of the pelvis were performed. Transabdominal technique was performed for global imaging of the pelvis including uterus, ovaries, adnexal regions, and pelvic cul-de-sac. It was necessary to proceed with endovaginal exam following the transabdominal exam to visualize the ovaries.  COMPARISON:  CT scan from 06/22/2014.  FINDINGS: Uterus  Measurements: 7.0 x 3.6 x 4.0 cm. No fibroids or other mass visualized.  Endometrium  Thickness: 3 mm.  No focal abnormality visualized.  Right ovary  Measurements: 3.9 x 1.4 x 2.3 cm. Normal appearance/no adnexal mass.  Left ovary  Measurements: 4.7 x 2.4  x 2.3 cm. Normal appearance/no adnexal mass.  Other findings  No free fluid.  IMPRESSION: Normal exam.   Electronically Signed   By: Kennith CenterEric  Mansell M.D.   On: 11/07/2014 20:38     EKG Interpretation None      MDM   Final diagnoses:  Lower abdominal pain  Abdominal pain in female patient    Patient here for evaluation of lower abdominal pain for the last 3 days associated with vomiting. Patient refuses pelvic exam and emergency department, denies any vaginal discharge or new partners. Patient is concerned that her birth control is contributing to her pain. Pelvic ultrasound obtained to evaluate for torsion, which was negative. UA is not consistent with UTI. Exam is nonfocal and not consistent with appendicitis. Discussed with patient abdominal pain home. Return precautions. On repeat abdominal examination emergency Department patient had minimal lower abdominal tenderness without guarding or rebound. Discussed with patient the importance of repeat abdominal examination when asked 12-24 hours. Patient without vomiting in the emergency Department tolerating oral  fluids.    Tilden Fossa, MD 11/07/14 [phone]

## 2014-11-07 NOTE — ED Notes (Signed)
Per pt sts 3 days of severe abd pain and vomiting. sts generalized. sts she has had 2 menstrual cycles.

## 2014-11-07 NOTE — ED Notes (Signed)
[facility]

## 2014-11-07 NOTE — Discharge Instructions (Signed)
Abdominal Pain, Women °Abdominal (stomach, pelvic, or belly) pain can be caused by many things. It is important to tell your doctor: °· The location of the pain. °· Does it come and go or is it present all the time? °· Are there things that start the pain (eating certain foods, exercise)? °· Are there other symptoms associated with the pain (fever, nausea, vomiting, diarrhea)? °All of this is helpful to know when trying to find the cause of the pain. °CAUSES  °· Stomach: virus or bacteria infection, or ulcer. °· Intestine: appendicitis (inflamed appendix), regional ileitis (Crohn's disease), ulcerative colitis (inflamed colon), irritable bowel syndrome, diverticulitis (inflamed diverticulum of the colon), or cancer of the stomach or intestine. °· Gallbladder disease or stones in the gallbladder. °· Kidney disease, kidney stones, or infection. °· Pancreas infection or cancer. °· Fibromyalgia (pain disorder). °· Diseases of the female organs: °¨ Uterus: fibroid (non-cancerous) tumors or infection. °¨ Fallopian tubes: infection or tubal pregnancy. °¨ Ovary: cysts or tumors. °¨ Pelvic adhesions (scar tissue). °¨ Endometriosis (uterus lining tissue growing in the pelvis and on the pelvic organs). °¨ Pelvic congestion syndrome (female organs filling up with blood just before the menstrual period). °¨ Pain with the menstrual period. °¨ Pain with ovulation (producing an egg). °¨ Pain with an IUD (intrauterine device, birth control) in the uterus. °¨ Cancer of the female organs. °· Functional pain (pain not caused by a disease, may improve without treatment). °· Psychological pain. °· Depression. °DIAGNOSIS  °Your doctor will decide the seriousness of your pain by doing an examination. °· Blood tests. °· X-rays. °· Ultrasound. °· CT scan (computed tomography, special type of X-ray). °· MRI (magnetic resonance imaging). °· Cultures, for infection. °· Barium enema (dye inserted in the large intestine, to better view it with  X-rays). °· Colonoscopy (looking in intestine with a lighted tube). °· Laparoscopy (minor surgery, looking in abdomen with a lighted tube). °· Major abdominal exploratory surgery (looking in abdomen with a large incision). °TREATMENT  °The treatment will depend on the cause of the pain.  °· Many cases can be observed and treated at home. °· Over-the-counter medicines recommended by your caregiver. °· Prescription medicine. °· Antibiotics, for infection. °· Birth control pills, for painful periods or for ovulation pain. °· Hormone treatment, for endometriosis. °· Nerve blocking injections. °· Physical therapy. °· Antidepressants. °· Counseling with a psychologist or psychiatrist. °· Minor or major surgery. °HOME CARE INSTRUCTIONS  °· Do not take laxatives, unless directed by your caregiver. °· Take over-the-counter pain medicine only if ordered by your caregiver. Do not take aspirin because it can cause an upset stomach or bleeding. °· Try a clear liquid diet (broth or water) as ordered by your caregiver. Slowly move to a bland diet, as tolerated, if the pain is related to the stomach or intestine. °· Have a thermometer and take your temperature several times a day, and record it. °· Bed rest and sleep, if it helps the pain. °· Avoid sexual intercourse, if it causes pain. °· Avoid stressful situations. °· Keep your follow-up appointments and tests, as your caregiver orders. °· If the pain does not go away with medicine or surgery, you may try: °¨ Acupuncture. °¨ Relaxation exercises (yoga, meditation). °¨ Group therapy. °¨ Counseling. °SEEK MEDICAL CARE IF:  °· You notice certain foods cause stomach pain. °· Your home care treatment is not helping your pain. °· You need stronger pain medicine. °· You want your IUD removed. °· You feel faint or   lightheaded. °· You develop nausea and vomiting. °· You develop a rash. °· You are having side effects or an allergy to your medicine. °SEEK IMMEDIATE MEDICAL CARE IF:  °· Your  pain does not go away or gets worse. °· You have a fever. °· Your pain is felt only in portions of the abdomen. The right side could possibly be appendicitis. The left lower portion of the abdomen could be colitis or diverticulitis. °· You are passing blood in your stools (bright red or black tarry stools, with or without vomiting). °· You have blood in your urine. °· You develop chills, with or without a fever. °· You pass out. °MAKE SURE YOU:  °· Understand these instructions. °· Will watch your condition. °· Will get help right away if you are not doing well or get worse. °Document Released: 08/22/2007 Document Revised: 03/11/2014 Document Reviewed: 09/11/2009 °ExitCare® Patient Information ©2015 [employer]. This information is not intended to replace advice given to you by your health care provider. Make sure you discuss any questions you have with your health care provider. ° °

## 2015-01-06 ENCOUNTER — Encounter (HOSPITAL_COMMUNITY): Payer: Self-pay | Admitting: *Deleted

## 2015-01-06 ENCOUNTER — Emergency Department (INDEPENDENT_AMBULATORY_CARE_PROVIDER_SITE_OTHER)
Admission: EM | Admit: 2015-01-06 | Discharge: 2015-01-06 | Disposition: A | Discharge status: Home / Self Care | Payer: Medicaid Other | Source: Home / Self Care | Attending: Emergency Medicine | Admitting: Emergency Medicine

## 2015-01-06 ENCOUNTER — Other Ambulatory Visit (HOSPITAL_COMMUNITY)
Admission: RE | Admit: 2015-01-06 | Discharge: 2015-01-06 | Disposition: A | Discharge status: Home / Self Care | Payer: Medicaid Other | Source: Ambulatory Visit | Attending: Emergency Medicine | Admitting: Emergency Medicine

## 2015-01-06 DIAGNOSIS — B9689 Other specified bacterial agents as the cause of diseases classified elsewhere: Secondary | ICD-10-CM

## 2015-01-06 DIAGNOSIS — N760 Acute vaginitis: Secondary | ICD-10-CM | POA: Diagnosis not present

## 2015-01-06 DIAGNOSIS — R109 Unspecified abdominal pain: Secondary | ICD-10-CM | POA: Diagnosis not present

## 2015-01-06 DIAGNOSIS — R141 Gas pain: Secondary | ICD-10-CM

## 2015-01-06 DIAGNOSIS — A499 Bacterial infection, unspecified: Secondary | ICD-10-CM

## 2015-01-06 DIAGNOSIS — Z113 Encounter for screening for infections with a predominantly sexual mode of transmission: Secondary | ICD-10-CM | POA: Insufficient documentation

## 2015-01-06 HISTORY — DX: Chlamydial infection, unspecified: A74.9

## 2015-01-06 LAB — POCT URINALYSIS DIP (DEVICE)
Bilirubin Urine: NEGATIVE
Glucose, UA: NEGATIVE mg/dL
HGB URINE DIPSTICK: NEGATIVE
Ketones, ur: NEGATIVE mg/dL
Leukocytes, UA: NEGATIVE
NITRITE: NEGATIVE
PH: 5.5 (ref 5.0–8.0)
Protein, ur: NEGATIVE mg/dL
Specific Gravity, Urine: >=1.03 (ref 1.005–1.030)
UROBILINOGEN UA: 0.2 mg/dL (ref 0.0–1.0)

## 2015-01-06 LAB — POCT PREGNANCY, URINE: PREG TEST UR: NEGATIVE

## 2015-01-06 MED ORDER — METRONIDAZOLE 500 MG PO TABS: 500.0000 mg | ORAL_TABLET | Freq: Two times a day (BID) | ORAL | Status: DC

## 2015-01-06 NOTE — ED Notes (Signed)
C/O frequent bouts of BV & yeast infections.  Started with vaginal discharge approx 4 days ago - has improved but continues with foul odor.  Yesterday started with low abd and low back pain along with dysuria, polyuria.  Denies fevers.  Has tried OTC yeast infection treatment without relief.

## 2015-01-06 NOTE — Discharge Instructions (Signed)
Abdominal Pain, Women °Abdominal (stomach, pelvic, or belly) pain can be caused by many things. It is important to tell your doctor: °· The location of the pain. °· Does it come and go or is it present all the time? °· Are there things that start the pain (eating certain foods, exercise)? °· Are there other symptoms associated with the pain (fever, nausea, vomiting, diarrhea)? °All of this is helpful to know when trying to find the cause of the pain. °CAUSES  °· Stomach: virus or bacteria infection, or ulcer. °· Intestine: appendicitis (inflamed appendix), regional ileitis (Crohn's disease), ulcerative colitis (inflamed colon), irritable bowel syndrome, diverticulitis (inflamed diverticulum of the colon), or cancer of the stomach or intestine. °· Gallbladder disease or stones in the gallbladder. °· Kidney disease, kidney stones, or infection. °· Pancreas infection or cancer. °· Fibromyalgia (pain disorder). °· Diseases of the female organs: °· Uterus: fibroid (non-cancerous) tumors or infection. °· Fallopian tubes: infection or tubal pregnancy. °· Ovary: cysts or tumors. °· Pelvic adhesions (scar tissue). °· Endometriosis (uterus lining tissue growing in the pelvis and on the pelvic organs). °· Pelvic congestion syndrome (female organs filling up with blood just before the menstrual period). °· Pain with the menstrual period. °· Pain with ovulation (producing an egg). °· Pain with an IUD (intrauterine device, birth control) in the uterus. °· Cancer of the female organs. °· Functional pain (pain not caused by a disease, may improve without treatment). °· Psychological pain. °· Depression. °DIAGNOSIS  °Your doctor will decide the seriousness of your pain by doing an examination. °· Blood tests. °· X-rays. °· Ultrasound. °· CT scan (computed tomography, special type of X-ray). °· MRI (magnetic resonance imaging). °· Cultures, for infection. °· Barium enema (dye inserted in the large intestine, to better view it with  X-rays). °· Colonoscopy (looking in intestine with a lighted tube). °· Laparoscopy (minor surgery, looking in abdomen with a lighted tube). °· Major abdominal exploratory surgery (looking in abdomen with a large incision). °TREATMENT  °The treatment will depend on the cause of the pain.  °· Many cases can be observed and treated at home. °· Over-the-counter medicines recommended by your caregiver. °· Prescription medicine. °· Antibiotics, for infection. °· Birth control pills, for painful periods or for ovulation pain. °· Hormone treatment, for endometriosis. °· Nerve blocking injections. °· Physical therapy. °· Antidepressants. °· Counseling with a psychologist or psychiatrist. °· Minor or major surgery. °HOME CARE INSTRUCTIONS  °· Do not take laxatives, unless directed by your caregiver. °· Take over-the-counter pain medicine only if ordered by your caregiver. Do not take aspirin because it can cause an upset stomach or bleeding. °· Try a clear liquid diet (broth or water) as ordered by your caregiver. Slowly move to a bland diet, as tolerated, if the pain is related to the stomach or intestine. °· Have a thermometer and take your temperature several times a day, and record it. °· Bed rest and sleep, if it helps the pain. °· Avoid sexual intercourse, if it causes pain. °· Avoid stressful situations. °· Keep your follow-up appointments and tests, as your caregiver orders. °· If the pain does not go away with medicine or surgery, you may try: °· Acupuncture. °· Relaxation exercises (yoga, meditation). °· Group therapy. °· Counseling. °SEEK MEDICAL CARE IF:  °· You notice certain foods cause stomach pain. °· Your home care treatment is not helping your pain. °· You need stronger pain medicine. °· You want your IUD removed. °· You feel faint or   lightheaded. °· You develop nausea and vomiting. °· You develop a rash. °· You are having side effects or an allergy to your medicine. °SEEK IMMEDIATE MEDICAL CARE IF:  °· Your  pain does not go away or gets worse. °· You have a fever. °· Your pain is felt only in portions of the abdomen. The right side could possibly be appendicitis. The left lower portion of the abdomen could be colitis or diverticulitis. °· You are passing blood in your stools (bright red or black tarry stools, with or without vomiting). °· You have blood in your urine. °· You develop chills, with or without a fever. °· You pass out. °MAKE SURE YOU:  °· Understand these instructions. °· Will watch your condition. °· Will get help right away if you are not doing well or get worse. °Document Released: 08/22/2007 Document Revised: 03/11/2014 Document Reviewed: 09/11/2009 °Exit[employer]® Patient Information ©2015 [employer]. This information is not intended to replace advice given to you by your health care provider. Make sure you discuss any questions you have with your health care provider. °Bacterial Vaginosis °Bacterial vaginosis is a vaginal infection that occurs when the normal balance of bacteria in the vagina is disrupted. It results from an overgrowth of certain bacteria. This is the most common vaginal infection in women of childbearing age. Treatment is important to prevent complications, especially in pregnant women, as it can cause a premature delivery. °CAUSES  °Bacterial vaginosis is caused by an increase in harmful bacteria that are normally present in smaller amounts in the vagina. Several different kinds of bacteria can cause bacterial vaginosis. However, the reason that the condition develops is not fully understood. °RISK FACTORS °Certain activities or behaviors can put you at an increased risk of developing bacterial vaginosis, including: °· Having a new sex partner or multiple sex partners. °· Douching. °· Using an intrauterine device (IUD) for contraception. °Women do not get bacterial vaginosis from toilet seats, bedding, swimming pools, or contact with objects around them. °SIGNS AND SYMPTOMS  °Some  women with bacterial vaginosis have no signs or symptoms. Common symptoms include: °· Grey vaginal discharge. °· A fishlike odor with discharge, especially after sexual intercourse. °· Itching or burning of the vagina and vulva. °· Burning or pain with urination. °DIAGNOSIS  °Your health care provider will take a medical history and examine the vagina for signs of bacterial vaginosis. A sample of vaginal fluid may be taken. Your health care provider will look at this sample under a microscope to check for bacteria and abnormal cells. A vaginal pH test may also be done.  °TREATMENT  °Bacterial vaginosis may be treated with antibiotic medicines. These may be given in the form of a pill or a vaginal cream. A second round of antibiotics may be prescribed if the condition comes back after treatment.  °HOME CARE INSTRUCTIONS  °· Only take over-the-counter or prescription medicines as directed by your health care provider. °· If antibiotic medicine was prescribed, take it as directed. Make sure you finish it even if you start to feel better. °· Do not have sex until treatment is completed. °· Tell all sexual partners that you have a vaginal infection. They should see their health care provider and be treated if they have problems, such as a mild rash or itching. °· Practice safe sex by using condoms and only having one sex partner. °SEEK MEDICAL CARE IF:  °· Your symptoms are not improving after 3 days of treatment. °· You have increased discharge   or pain. °· You have a fever. °MAKE SURE YOU:  °· Understand these instructions. °· Will watch your condition. °· Will get help right away if you are not doing well or get worse. °FOR MORE INFORMATION  °Centers for Disease Control and Prevention, Division of STD Prevention: www.cdc.gov/std °American Sexual Health Association (ASHA): www.ashastd.org  °Document Released: 10/25/2005 Document Revised: 08/15/2013 Document Reviewed: 06/06/2013 °[employer]® Patient Information ©2015  [employer]. This information is not intended to replace advice given to you by your health care provider. Make sure you discuss any questions you have with your health care provider. ° °

## 2015-01-06 NOTE — ED Provider Notes (Signed)
CSN: [id number]     Arrival date & time 01/06/15  1736 History   First MD Initiated Contact with Patient 01/06/15 1924     Chief Complaint  Patient presents with  . Abdominal Pain  . Vaginal Discharge   (Consider location/radiation/quality/duration/timing/severity/associated sxs/prior Treatment) HPI Comments: 19 year old female complaining of bilateral abdominal pain and occasional vomiting after eating. She is also experiencing vaginal discharge similar to last December when she was diagnosed with BV.   Past Medical History  Diagnosis Date  . Gonorrhea   . BV (bacterial vaginosis)   . Vaginal yeast infection   . Chlamydia    Past Surgical History  Procedure Laterality Date  . No past surgeries     Family History  Problem Relation Age of Onset  . Diabetes Maternal Uncle   . Cancer Maternal Grandmother     pancreatic   History  Substance Use Topics  . Smoking status: Never Smoker   . Smokeless tobacco: Never Used  . Alcohol Use: No   OB History    Gravida Para Term Preterm AB TAB SAB Ectopic Multiple Living   0     Review of Systems  Constitutional: Negative.   Respiratory: Negative for cough and shortness of breath.   Cardiovascular: Negative for chest pain.  Gastrointestinal: Positive for nausea, vomiting and abdominal pain.  Genitourinary: Positive for vaginal discharge. Negative for dysuria, frequency, vaginal bleeding and pelvic pain.  Musculoskeletal: Negative.   Neurological: Negative.     Allergies  Review of patient's allergies indicates no known allergies.  Home Medications   Prior to Admission medications   Medication Sig Start Date End Date Taking? Authorizing Provider  norgestimate-ethinyl estradiol (ORTHO-CYCLEN, 28,) 0.25-35 MG-MCG tablet Take 1 tablet by mouth daily. 10/21/14  Yes Lisa A Leftwich-Kirby, CNM  metroNIDAZOLE (FLAGYL) 500 MG tablet Take 1 tablet (500 mg total) by mouth 2 (two) times daily. X 7 days 01/06/15   Hayden Rasmussen,  NP   BP 122/83 mmHg  Pulse 81  Temp(Src) 98.6 F (37 C) (Oral)  Resp 16  SpO2 98%  LMP 12/06/2014 (Exact Date) Physical Exam  Constitutional: She appears well-developed and well-nourished. No distress.  Cardiovascular: Normal rate, regular rhythm and normal heart sounds.   Pulmonary/Chest: Effort normal and breath sounds normal. No respiratory distress.  Abdominal: Soft. She exhibits no distension. There is no rebound and no guarding.  Abdomen is flat, firm percusses tympanic.  Genitourinary:  Normal external female genitalia Intravaginal quite malodorous discharge. Much of the discharges of the vaginal vault and encompassing the cervix. Cervix is anterior midline, pink and without lesions. No CMT or adnexal tenderness.  Neurological: She is alert. She exhibits normal muscle tone.  Skin: Skin is warm and dry.  Psychiatric: She has a normal mood and affect.  Nursing note and vitals reviewed.   ED Course  Procedures (including critical care time) Labs Review Labs Reviewed  POCT URINALYSIS DIP (DEVICE)  POCT PREGNANCY, URINE   Imaging Review No results found.  Results for orders placed or performed during the hospital encounter of 01/06/15  POCT urinalysis dip (device)  Result Value Ref Range   Glucose, UA NEGATIVE NEGATIVE mg/dL   Bilirubin Urine NEGATIVE NEGATIVE   Ketones, ur NEGATIVE NEGATIVE mg/dL   Specific Gravity, Urine >=1.030 1.005 - 1.030   Hgb urine dipstick NEGATIVE NEGATIVE   pH 5.5 5.0 - 8.0   Protein, ur NEGATIVE NEGATIVE mg/dL   Urobilinogen, UA 0.2 0.0 -  1.0 mg/dL   Nitrite NEGATIVE NEGATIVE   Leukocytes, UA NEGATIVE NEGATIVE  Pregnancy, urine POC  Result Value Ref Range   Preg Test, Ur NEGATIVE NEGATIVE    MDM   1. BV (bacterial vaginosis)   2. Vaginitis   3. Abdominal pain, unspecified abdominal location   4. Abdominal gas pain    Suspect the bilateral abdominal discomfort is gas pain. Abdomen pounds patent is if there is stool and  gas. Flagyl for BV. Cervical cytology pending.    Hayden Rasmussenavid Treyshon Buchanon, NP 01/06/15 1952  Hayden Rasmussenavid Rich Paprocki, NP 01/06/15 [phone]

## 2015-01-08 LAB — CERVICOVAGINAL ANCILLARY ONLY
Chlamydia: NEGATIVE
Neisseria Gonorrhea: NEGATIVE
Wet Prep (BD Affirm): NEGATIVE
Wet Prep (BD Affirm): NEGATIVE
Wet Prep (BD Affirm): POSITIVE — AB

## 2015-01-10 NOTE — ED Notes (Signed)
GC/Chlamydia neg., affirm: Candida and Trich neg., Gardnerella pos. Pt. adequately treated with Flagyl Vassie MoselleYork, Kiran Carline M 01/10/2015

## 2015-01-25 ENCOUNTER — Encounter (HOSPITAL_COMMUNITY): Payer: Self-pay | Admitting: Emergency Medicine

## 2015-01-25 ENCOUNTER — Emergency Department (INDEPENDENT_AMBULATORY_CARE_PROVIDER_SITE_OTHER)
Admission: EM | Admit: 2015-01-25 | Discharge: 2015-01-25 | Disposition: A | Discharge status: Home / Self Care | Payer: Medicaid Other | Source: Home / Self Care | Attending: Emergency Medicine | Admitting: Emergency Medicine

## 2015-01-25 DIAGNOSIS — N760 Acute vaginitis: Secondary | ICD-10-CM

## 2015-01-25 DIAGNOSIS — B9689 Other specified bacterial agents as the cause of diseases classified elsewhere: Secondary | ICD-10-CM

## 2015-01-25 DIAGNOSIS — A499 Bacterial infection, unspecified: Secondary | ICD-10-CM

## 2015-01-25 MED ORDER — CLINDAMYCIN PHOSPHATE 2 % VA CREA: 1.0000 | TOPICAL_CREAM | Freq: Every day | VAGINAL | Status: DC

## 2015-01-25 MED ORDER — FLUCONAZOLE 150 MG PO TABS: 150.0000 mg | ORAL_TABLET | Freq: Once | ORAL | Status: DC

## 2015-01-25 NOTE — ED Provider Notes (Signed)
CSN: [id number]     Arrival date & time 01/25/15  1545 History   First MD Initiated Contact with Patient 01/25/15 1711     Chief Complaint  Patient presents with  . Vaginal Discharge   (Consider location/radiation/quality/duration/timing/severity/associated sxs/prior Treatment) HPI       19 year old female presents complaining of vaginal discharge. She was seen here 3 weeks ago for this problem as well. She was diagnosed with bacterial vaginosis which was confirmed by her cytology swabs. She was treated with metronidazole and she got better for a few days but her symptoms returned after she stopped the metronidazole. All of her STD tests were negative and she has not been sexually active since then. She admits to using scented sprays, products, and soaps, she thinks this may be contributing to this problem. She does not douche. No dominant pain or vaginal bleeding  Past Medical History  Diagnosis Date  . Gonorrhea   . BV (bacterial vaginosis)   . Vaginal yeast infection   . Chlamydia    Past Surgical History  Procedure Laterality Date  . No past surgeries     Family History  Problem Relation Age of Onset  . Diabetes Maternal Uncle   . Cancer Maternal Grandmother     pancreatic   History  Substance Use Topics  . Smoking status: Never Smoker   . Smokeless tobacco: Never Used  . Alcohol Use: No   OB History    Gravida Para Term Preterm AB TAB SAB Ectopic Multiple Living   0     Review of Systems  Genitourinary: Positive for vaginal discharge.  All other systems reviewed and are negative.   Allergies  Review of patient's allergies indicates no known allergies.  Home Medications   Prior to Admission medications   Medication Sig Start Date End Date Taking? Authorizing Provider  metroNIDAZOLE (FLAGYL) 500 MG tablet Take 1 tablet (500 mg total) by mouth 2 (two) times daily. X 7 days 01/06/15  Yes Hayden Rasmussen, NP  clindamycin (CLEOCIN) 2 % vaginal cream Place 1  Applicatorful vaginally at bedtime. 01/25/15   Adrian Blackwater Lillyanne Bradburn, PA-C  fluconazole (DIFLUCAN) 150 MG tablet Take 1 tablet (150 mg total) by mouth once. Pick up the refill and and take second dose in 5 days if symptoms have not resolved 01/25/15   Graylon Good, PA-C  norgestimate-ethinyl estradiol (ORTHO-CYCLEN, 28,) 0.25-35 MG-MCG tablet Take 1 tablet by mouth daily. 10/21/14   Misty Stanley A Leftwich-Kirby, CNM   BP 123/79 mmHg  Pulse 71  Temp(Src) 98.2 F (36.8 C) (Oral)  SpO2 99%  LMP 01/12/2015 Physical Exam  Constitutional: She is oriented to person, place, and time. Vital signs are normal. She appears well-developed and well-nourished. No distress.  HENT:  Head: Normocephalic and atraumatic.  Pulmonary/Chest: Effort normal. No respiratory distress.  Neurological: She is alert and oriented to person, place, and time. She has normal strength. Coordination normal.  Skin: Skin is warm and dry. No rash noted. She is not diaphoretic.  Psychiatric: She has a normal mood and affect. Judgment normal.  Nursing note and vitals reviewed.   ED Course  Procedures (including critical care time) Labs Review Labs Reviewed - No data to display  Imaging Review No results found.   MDM   1. Bacterial vaginosis    advised her to stop using the scented products. Treat with clindamycin vaginal cream. Also provided Diflucan at her request because she gets frequent yeast  infections. She has declined a pelvic exam because she says that she has not been sexually active since she was tested last time she was here.  Meds ordered this encounter  Medications  . clindamycin (CLEOCIN) 2 % vaginal cream    Sig: Place 1 Applicatorful vaginally at bedtime.    Dispense:  40 g    Refill:  0  . fluconazole (DIFLUCAN) 150 MG tablet    Sig: Take 1 tablet (150 mg total) by mouth once. Pick up the refill and and take second dose in 5 days if symptoms have not resolved    Dispense:  1 tablet    Refill:  1        Graylon GoodZachary H Smitty Ackerley, PA-C 01/25/15 1736

## 2015-01-25 NOTE — Discharge Instructions (Signed)
Bacterial Vaginosis Bacterial vaginosis is a vaginal infection that occurs when the normal balance of bacteria in the vagina is disrupted. It results from an overgrowth of certain bacteria. This is the most common vaginal infection in women of childbearing age. Treatment is important to prevent complications, especially in pregnant women, as it can cause a premature delivery. CAUSES  Bacterial vaginosis is caused by an increase in harmful bacteria that are normally present in smaller amounts in the vagina. Several different kinds of bacteria can cause bacterial vaginosis. However, the reason that the condition develops is not fully understood. RISK FACTORS Certain activities or behaviors can put you at an increased risk of developing bacterial vaginosis, including:  Having a new sex partner or multiple sex partners.  Douching.  Using an intrauterine device (IUD) for contraception. Women do not get bacterial vaginosis from toilet seats, bedding, swimming pools, or contact with objects around them. SIGNS AND SYMPTOMS  Some women with bacterial vaginosis have no signs or symptoms. Common symptoms include:  Grey vaginal discharge.  A fishlike odor with discharge, especially after sexual intercourse.  Itching or burning of the vagina and vulva.  Burning or pain with urination. DIAGNOSIS  Your health care provider will take a medical history and examine the vagina for signs of bacterial vaginosis. A sample of vaginal fluid may be taken. Your health care provider will look at this sample under a microscope to check for bacteria and abnormal cells. A vaginal pH test may also be done.  TREATMENT  Bacterial vaginosis may be treated with antibiotic medicines. These may be given in the form of a pill or a vaginal cream. A second round of antibiotics may be prescribed if the condition comes back after treatment.  HOME CARE INSTRUCTIONS   Only take over-the-counter or prescription medicines as  directed by your health care provider.  If antibiotic medicine was prescribed, take it as directed. Make sure you finish it even if you start to feel better.  Do not have sex until treatment is completed.  Tell all sexual partners that you have a vaginal infection. They should see their health care provider and be treated if they have problems, such as a mild rash or itching.  Practice safe sex by using condoms and only having one sex partner. SEEK MEDICAL CARE IF:   Your symptoms are not improving after 3 days of treatment.  You have increased discharge or pain.  You have a fever. MAKE SURE YOU:   Understand these instructions.  Will watch your condition.  Will get help right away if you are not doing well or get worse. FOR MORE INFORMATION  Centers for Disease Control and Prevention, Division of STD Prevention: www.cdc.gov/std American Sexual Health Association (ASHA): www.ashastd.org  Document Released: 10/25/2005 Document Revised: 08/15/2013 Document Reviewed: 06/06/2013 ExitCare Patient Information 2015 [employer]. This information is not intended to replace advice given to you by your health care provider. Make sure you discuss any questions you have with your health care provider.  

## 2015-01-25 NOTE — ED Notes (Signed)
Pt c/o vaginal irritation and d/c. Pt was treated for BV 2 weeks ago.

## 2015-03-31 ENCOUNTER — Emergency Department (INDEPENDENT_AMBULATORY_CARE_PROVIDER_SITE_OTHER)
Admission: EM | Admit: 2015-03-31 | Discharge: 2015-03-31 | Disposition: A | Discharge status: Home / Self Care | Payer: Medicaid Other | Source: Home / Self Care | Attending: Family Medicine | Admitting: Family Medicine

## 2015-03-31 ENCOUNTER — Encounter (HOSPITAL_COMMUNITY): Payer: Self-pay | Admitting: Emergency Medicine

## 2015-03-31 DIAGNOSIS — N3001 Acute cystitis with hematuria: Secondary | ICD-10-CM

## 2015-03-31 LAB — POCT URINALYSIS DIP (DEVICE)
BILIRUBIN URINE: NEGATIVE
Glucose, UA: NEGATIVE mg/dL
Nitrite: NEGATIVE
Protein, ur: >=300 mg/dL — AB
Urobilinogen, UA: 0.2 mg/dL (ref 0.0–1.0)
pH: 7.5 (ref 5.0–8.0)

## 2015-03-31 LAB — POCT PREGNANCY, URINE: Preg Test, Ur: NEGATIVE

## 2015-03-31 MED ORDER — CEPHALEXIN 500 MG PO CAPS: 500.0000 mg | ORAL_CAPSULE | Freq: Two times a day (BID) | ORAL | Status: DC

## 2015-03-31 NOTE — Discharge Instructions (Signed)
Thank you for coming in today. °Call or go to the emergency room if you get worse, have trouble breathing, have chest pains, or palpitations.  ° °Urinary Tract Infection °Urinary tract infections (UTIs) can develop anywhere along your urinary tract. Your urinary tract is your body's drainage system for removing wastes and extra water. Your urinary tract includes two kidneys, two ureters, a bladder, and a urethra. Your kidneys are a pair of bean-shaped organs. Each kidney is about the size of your fist. They are located below your ribs, one on each side of your spine. °CAUSES °Infections are caused by microbes, which are microscopic organisms, including fungi, viruses, and bacteria. These organisms are so small that they can only be seen through a microscope. Bacteria are the microbes that most commonly cause UTIs. °SYMPTOMS  °Symptoms of UTIs may vary by age and gender of the patient and by the location of the infection. Symptoms in young women typically include a frequent and intense urge to urinate and a painful, burning feeling in the bladder or urethra during urination. Older women and men are more likely to be tired, shaky, and weak and have muscle aches and abdominal pain. A fever may mean the infection is in your kidneys. Other symptoms of a kidney infection include pain in your back or sides below the ribs, nausea, and vomiting. °DIAGNOSIS °To diagnose a UTI, your caregiver will ask you about your symptoms. Your caregiver also will ask to provide a urine sample. The urine sample will be tested for bacteria and white blood cells. White blood cells are made by your body to help fight infection. °TREATMENT  °Typically, UTIs can be treated with medication. Because most UTIs are caused by a bacterial infection, they usually can be treated with the use of antibiotics. The choice of antibiotic and length of treatment depend on your symptoms and the type of bacteria causing your infection. °HOME CARE  INSTRUCTIONS °· If you were prescribed antibiotics, take them exactly as your caregiver instructs you. Finish the medication even if you feel better after you have only taken some of the medication. °· Drink enough water and fluids to keep your urine clear or pale yellow. °· Avoid caffeine, tea, and carbonated beverages. They tend to irritate your bladder. °· Empty your bladder often. Avoid holding urine for long periods of time. °· Empty your bladder before and after sexual intercourse. °· After a bowel movement, women should cleanse from front to back. Use each tissue only once. °SEEK MEDICAL CARE IF:  °· You have back pain. °· You develop a fever. °· Your symptoms do not begin to resolve within 3 days. °SEEK IMMEDIATE MEDICAL CARE IF:  °· You have severe back pain or lower abdominal pain. °· You develop chills. °· You have nausea or vomiting. °· You have continued burning or discomfort with urination. °MAKE SURE YOU:  °· Understand these instructions. °· Will watch your condition. °· Will get help right away if you are not doing well or get worse. °Document Released: 08/04/2005 Document Revised: 04/25/2012 Document Reviewed: 12/03/2011 °[employer]® Patient Information ©2015 [employer]. This information is not intended to replace advice given to you by your health care provider. Make sure you discuss any questions you have with your health care provider. ° °

## 2015-03-31 NOTE — ED Notes (Signed)
C/o UTI sx onset 3 days Sx include abd pain, dysuria, urinary freq/urgency; has noticed blood when she wipes Alert, no signs of acute distress.

## 2015-03-31 NOTE — ED Provider Notes (Signed)
Terry [name] is a 19 y.o. female who presents to Urgent Care today for urinary frequency urgency and dysuria present for 3 days. Symptoms are consistent with prior episodes of UTI. No fevers chills nausea vomiting or diarrhea. She notes blood in the urine. She has not tried any medications yet.   Past Medical History  Diagnosis Date  . Gonorrhea   . BV (bacterial vaginosis)   . Vaginal yeast infection   . Chlamydia    Past Surgical History  Procedure Laterality Date  . No past surgeries     History  Substance Use Topics  . Smoking status: Never Smoker   . Smokeless tobacco: Never Used  . Alcohol Use: No   ROS as above Medications: No current facility-administered medications for this encounter.   Current Outpatient Prescriptions  Medication Sig Dispense Refill  . cephALEXin (KEFLEX) 500 MG capsule Take 1 capsule (500 mg total) by mouth 2 (two) times daily. 14 capsule 0  . norgestimate-ethinyl estradiol (ORTHO-CYCLEN, 28,) 0.25-35 MG-MCG tablet Take 1 tablet by mouth daily. 1 Package 11   No Known Allergies   Exam:  BP 113/76 mmHg  Pulse 78  Temp(Src) 99 F (37.2 C) (Oral)  Resp 16  SpO2 98%  LMP 03/17/2015  Gen: Well NAD HEENT: EOMI,  MMM Lungs: Normal work of breathing. CTABL Heart: RRR no MRG Abd: NABS, Soft. Nondistended, Nontender no CV angle tenderness to percussion. Exts: Brisk capillary refill, warm and well perfused.   Results for orders placed or performed during the hospital encounter of 03/31/15 (from the past 24 hour(s))  POCT urinalysis dip (device)     Status: Abnormal   Collection Time: 03/31/15  2:20 PM  Result Value Ref Range   Glucose, UA NEGATIVE NEGATIVE mg/dL   Bilirubin Urine NEGATIVE NEGATIVE   Ketones, ur TRACE (A) NEGATIVE mg/dL   Specific Gravity, Urine >=1.030 1.005 - 1.030   Hgb urine dipstick MODERATE (A) NEGATIVE   pH 7.5 5.0 - 8.0   Protein, ur >=300 (A) NEGATIVE mg/dL   Urobilinogen, UA 0.2 0.0 - 1.0 mg/dL   Nitrite  NEGATIVE NEGATIVE   Leukocytes, UA LARGE (A) NEGATIVE  Pregnancy, urine POC     Status: None   Collection Time: 03/31/15  2:21 PM  Result Value Ref Range   Preg Test, Ur NEGATIVE NEGATIVE   No results found.  Assessment and Plan: 19 y.o. female with UTI. Treat with Keflex.  Discussed warning signs or symptoms. Please see discharge instructions. Patient expresses understanding.     Rodolph BongEvan S Nandana Krolikowski, MD 03/31/15 [phone]

## 2015-04-10 ENCOUNTER — Encounter (HOSPITAL_COMMUNITY): Payer: Self-pay | Admitting: Emergency Medicine

## 2015-04-10 ENCOUNTER — Other Ambulatory Visit (HOSPITAL_COMMUNITY)
Admission: RE | Admit: 2015-04-10 | Discharge: 2015-04-10 | Disposition: A | Discharge status: Home / Self Care | Payer: Medicaid Other | Source: Ambulatory Visit | Attending: Family Medicine | Admitting: Family Medicine

## 2015-04-10 ENCOUNTER — Emergency Department (INDEPENDENT_AMBULATORY_CARE_PROVIDER_SITE_OTHER)
Admission: EM | Admit: 2015-04-10 | Discharge: 2015-04-10 | Disposition: A | Discharge status: Home / Self Care | Payer: Medicaid Other | Source: Home / Self Care | Attending: Family Medicine | Admitting: Family Medicine

## 2015-04-10 DIAGNOSIS — N898 Other specified noninflammatory disorders of vagina: Secondary | ICD-10-CM

## 2015-04-10 DIAGNOSIS — N760 Acute vaginitis: Secondary | ICD-10-CM | POA: Insufficient documentation

## 2015-04-10 DIAGNOSIS — Z113 Encounter for screening for infections with a predominantly sexual mode of transmission: Secondary | ICD-10-CM | POA: Diagnosis present

## 2015-04-10 LAB — POCT URINALYSIS DIP (DEVICE)
Bilirubin Urine: NEGATIVE
Glucose, UA: NEGATIVE mg/dL
Hgb urine dipstick: NEGATIVE
KETONES UR: NEGATIVE mg/dL
Nitrite: NEGATIVE
Protein, ur: NEGATIVE mg/dL
Specific Gravity, Urine: 1.02 (ref 1.005–1.030)
Urobilinogen, UA: 0.2 mg/dL (ref 0.0–1.0)
pH: 7.5 (ref 5.0–8.0)

## 2015-04-10 LAB — POCT PREGNANCY, URINE: PREG TEST UR: NEGATIVE

## 2015-04-10 NOTE — ED Notes (Signed)
Seen 5/23 and treated for uti per patient.  Finished medicine.  Reports she has noticed a vaginal discharge that seems to be going away.

## 2015-04-10 NOTE — Discharge Instructions (Signed)
We will let you know if your lab results show any infection. Please follow up with an OB/GYN for family counseling.

## 2015-04-10 NOTE — ED Provider Notes (Addendum)
CSN: [id number]     Arrival date & time 04/10/15  1115 History   First MD Initiated Contact with Patient 04/10/15 1206     Chief Complaint  Patient presents with  . Vaginal Discharge   (Consider location/radiation/quality/duration/timing/severity/associated sxs/prior Treatment) HPI   Vaginal discharge 2-3 days. Green. No odor. Has not taken anything for the symptoms. No irritation. Dysuria and frequency. Sexually active, no condoms. We try to get pregnant. Denies abdominal pain, fevers, nausea, vomiting, constipation, diarrhea. Symptoms are intermittent. Typically sees the discharge when wiping after going to the bathroom. Symptoms not any better or worse. Has not tried anything for her symptoms.     Past Medical History  Diagnosis Date  . Gonorrhea   . BV (bacterial vaginosis)   . Vaginal yeast infection   . Chlamydia    Past Surgical History  Procedure Laterality Date  . No past surgeries     Family History  Problem Relation Age of Onset  . Diabetes Maternal Uncle   . Cancer Maternal Grandmother     pancreatic   History  Substance Use Topics  . Smoking status: Never Smoker   . Smokeless tobacco: Never Used  . Alcohol Use: No   OB History    Gravida Para Term Preterm AB TAB SAB Ectopic Multiple Living   1    1  1    0     Review of Systems Per HPI with all other pertinent systems negative.   Allergies  Review of patient's allergies indicates no known allergies.  Home Medications   Prior to Admission medications   Medication Sig Start Date End Date Taking? Authorizing Provider  cephALEXin (KEFLEX) 500 MG capsule Take 1 capsule (500 mg total) by mouth 2 (two) times daily. Patient not taking: Reported on 04/10/2015 03/31/15   Rodolph BongEvan S Corey, MD  norgestimate-ethinyl estradiol (ORTHO-CYCLEN, 28,) 0.25-35 MG-MCG tablet Take 1 tablet by mouth daily. 10/21/14   Misty StanleyLisa A Leftwich-Kirby, CNM   BP 123/79 mmHg  Pulse 90  Temp(Src) 97.6 F (36.4 C) (Oral)  Resp 16  SpO2 98%   LMP 03/17/2015 Physical Exam Physical Exam  Constitutional: oriented to person, place, and time. appears well-developed and well-nourished. No distress.  HENT:  Head: Normocephalic and atraumatic.  Eyes: EOMI. PERRL.  Neck: Normal range of motion.  Cardiovascular: RRR, no m/r/g, 2+ distal pulses,  Pulmonary/Chest: Effort normal and breath sounds normal. No respiratory distress.  Abdominal: Soft. Bowel sounds are normal. NonTTP, no distension.  Musculoskeletal: Normal range of motion. Non ttp, no effusion.  Neurological: alert and oriented to person, place, and time.  Skin: Skin is warm. No rash noted. non diaphoretic.  Psychiatric: normal mood and affect. behavior is normal. Judgment and thought content normal.  Watery green discharge in the vaginal vault, wall is well rugated without lesions, no cervical motion tenderness.   ED Course  Procedures (including critical care time) Labs Review Labs Reviewed  POCT URINALYSIS DIP (DEVICE) - Abnormal; Notable for the following:    Leukocytes, UA LARGE (*)    All other components within normal limits  POCT PREGNANCY, URINE    Imaging Review No results found.   MDM   1. Vaginal discharge    Contraceptive management discussed. Patient's urine pregnancy negative.  UTI appears to have cleared based on symptoms and UA.   Wet prep sent, STD check. Recommend patient go to OB/GYN for family counseling. We'll prescribe medications as necessary based on lab results.    Ozella Rocksavid J Mattie Novosel, MD 04/10/15  1242  Ozella Rocks, MD 04/10/15 1242

## 2015-04-11 LAB — CERVICOVAGINAL ANCILLARY ONLY
CHLAMYDIA, DNA PROBE: NEGATIVE
Neisseria Gonorrhea: NEGATIVE
Wet Prep (BD Affirm): NEGATIVE

## 2015-04-22 ENCOUNTER — Emergency Department (HOSPITAL_COMMUNITY)
Admission: EM | Admit: 2015-04-22 | Discharge: 2015-04-22 | Disposition: A | Discharge status: Home / Self Care | Payer: Medicaid Other | Attending: Emergency Medicine | Admitting: Emergency Medicine

## 2015-04-22 ENCOUNTER — Encounter (HOSPITAL_COMMUNITY): Payer: Self-pay | Admitting: Emergency Medicine

## 2015-04-22 DIAGNOSIS — Z8619 Personal history of other infectious and parasitic diseases: Secondary | ICD-10-CM | POA: Diagnosis not present

## 2015-04-22 DIAGNOSIS — N760 Acute vaginitis: Secondary | ICD-10-CM | POA: Diagnosis present

## 2015-04-22 DIAGNOSIS — N898 Other specified noninflammatory disorders of vagina: Secondary | ICD-10-CM | POA: Insufficient documentation

## 2015-04-22 LAB — WET PREP, GENITAL
Clue Cells Wet Prep HPF POC: NONE SEEN
Trich, Wet Prep: NONE SEEN
WBC, Wet Prep HPF POC: NONE SEEN
YEAST WET PREP: NONE SEEN

## 2015-04-22 MED ORDER — FLUCONAZOLE 100 MG PO TABS
150.0000 mg | ORAL_TABLET | Freq: Once | ORAL | Status: AC
  Administered 2015-04-22: 150 mg via ORAL
  Filled 2015-04-22: qty 2

## 2015-04-22 NOTE — Discharge Instructions (Signed)
Your vaginal swab came back normal today. Urine gonorrhea and chlamydia swabs are pending and will be back in 2-3 days and if return of normal you will be contacted. Follow-up with primary care doctor.

## 2015-04-22 NOTE — ED Notes (Signed)
Pt reports uti last week then states she noticed a green vaginal discharge, states discharge stopped when her cycle started but has since started back. Now reports foul odor to discharge, denies any urinary symptoms or pain.

## 2015-04-22 NOTE — ED Notes (Signed)
Pt. Stated, I have a infection in my vagina and I went to UC and they said I did not.  I know I do because of the smell and discharge.

## 2015-04-22 NOTE — ED Provider Notes (Signed)
CSN: [id number]     Arrival date & time 04/22/15  1416 History  This chart was scribed for non-physician practitioner Jaynie Crumble, PA-C working with Rolland Porter, MD by Lyndel Safe, ED Scribe. This patient was seen in room TR11C/TR11C and the patient's care was started at 4:29 PM.   Chief Complaint  Patient presents with  . Vaginitis   The history is provided by the patient. No language interpreter was used.   HPI Comments: Terry [name] is a 19 y.o. female, with a PMhx of gonorrhea/chlamydia, BV, and yeast infections, who presents to the Emergency Department complaining of constant, moderate, green vaginal discharge that is malodorous onset 7 days ago. She was seen at Urgent Care on 6/2 where she received a pelvic exam and STD screening and diagnosed with a UTI. After she completed the abx course she noticed a yellow discharge.Pt does not use protection during sexual intercourse.  Pt denies taking oral BC, vaginal pain, dysuria, or any other urinary symptoms, abdominal pain, fever or chills, or chance of pregnancy.  LNMP was 6/7-6/11.   Past Medical History  Diagnosis Date  . Gonorrhea   . BV (bacterial vaginosis)   . Vaginal yeast infection   . Chlamydia    Past Surgical History  Procedure Laterality Date  . No past surgeries     Family History  Problem Relation Age of Onset  . Diabetes Maternal Uncle   . Cancer Maternal Grandmother     pancreatic   History  Substance Use Topics  . Smoking status: Never Smoker   . Smokeless tobacco: Never Used  . Alcohol Use: No   OB History    Gravida Para Term Preterm AB TAB SAB Ectopic Multiple Living   0     Review of Systems  Constitutional: Negative for fever and chills.  Gastrointestinal: Negative for abdominal pain.  Genitourinary: Positive for vaginal discharge. Negative for dysuria, urgency, frequency, difficulty urinating and vaginal pain.    Allergies  Review of patient's allergies indicates no  known allergies.  Home Medications   Prior to Admission medications   Medication Sig Start Date End Date Taking? Authorizing Provider  norgestimate-ethinyl estradiol (ORTHO-CYCLEN, 28,) 0.25-35 MG-MCG tablet Take 1 tablet by mouth daily. 10/21/14   Lisa A Leftwich-Kirby, CNM   BP 128/80 mmHg  Pulse 103  Temp(Src) 98.2 F (36.8 C) (Oral)  Resp 16  Ht  (1.778 m)  Wt 139 lb 6.4 oz (63.231 kg)  BMI 20.00 kg/m2  SpO2 100%  LMP 04/15/2015 Physical Exam  Constitutional: She is oriented to person, place, and time. She appears well-developed and well-nourished. No distress.  HENT:  Head: Normocephalic.  Right Ear: External ear normal.  Left Ear: External ear normal.  Mouth/Throat: No oropharyngeal exudate.  Eyes: Pupils are equal, round, and reactive to light. Right eye exhibits no discharge. Left eye exhibits no discharge. No scleral icterus.  Neck: No JVD present.  Cardiovascular: Normal rate, regular rhythm and normal heart sounds.   Pulmonary/Chest: Effort normal and breath sounds normal. No respiratory distress.  Genitourinary:  Normal external genitalia. Normal vaginal canal with small white discharge. Normal cervix with no cmt. No uterine or adnexal tenderness  Musculoskeletal: She exhibits no edema.  Lymphadenopathy:    She has no cervical adenopathy.  Neurological: She is alert and oriented to person, place, and time.  Skin: Skin is warm. No rash noted. No erythema. No pallor.  Psychiatric: She has a normal  mood and affect. Her behavior is normal.  Nursing note and vitals reviewed.  ED Course  Procedures  DIAGNOSTIC STUDIES: Oxygen Saturation is 100% on RA, normal by my interpretation.    COORDINATION OF CARE: 4:34 PM Discussed treatment plan with pt. Pt acknowledges and agrees to plan.   Labs Review Labs Reviewed  WET PREP, GENITAL  GC/CHLAMYDIA PROBE AMP ([facility]) NOT AT [facility]    Imaging Review No results found.   EKG Interpretation None       MDM   Final diagnoses:  Vaginal discharge     patient's vaginal exam is unremarkable. Wet prep is negative. I gave her Diflucan for possible mild yeast infection given her symptoms started after antibiotics. Advised to follow-up with OB/GYN if needed. GC chlamydia pending and she'll be notified if return of normal. Patient voiced understanding.  Filed Vitals:   04/22/15 1423 04/22/15 1658 04/22/15 1811  BP: 128/80 123/79 119/72  Pulse: 103 61 58  Temp: 98.2 F (36.8 C)  97.9 F (36.6 C)  TempSrc: Oral  Oral  Resp: 16 16 16   Height: 5\' 10"  (1.778 m)    Weight: 139 lb 6.4 oz (63.231 kg)    SpO2: 100% 100% 100%   I personally performed the services described in this documentation, which was scribed in my presence. The recorded information has been reviewed and is accurate.    Jaynie Crumble, PA-C 04/22/15 2252  Arby Barrette, MD 04/25/15 [phone]

## 2015-04-23 LAB — GC/CHLAMYDIA PROBE AMP (~~LOC~~) NOT AT ARMC
Chlamydia: NEGATIVE
Neisseria Gonorrhea: NEGATIVE

## 2015-06-02 ENCOUNTER — Emergency Department (INDEPENDENT_AMBULATORY_CARE_PROVIDER_SITE_OTHER)
Admission: EM | Admit: 2015-06-02 | Discharge: 2015-06-02 | Disposition: A | Discharge status: Home / Self Care | Payer: Medicaid Other | Source: Home / Self Care | Attending: Family Medicine | Admitting: Family Medicine

## 2015-06-02 ENCOUNTER — Encounter (HOSPITAL_COMMUNITY): Payer: Self-pay | Admitting: Emergency Medicine

## 2015-06-02 DIAGNOSIS — N390 Urinary tract infection, site not specified: Secondary | ICD-10-CM

## 2015-06-02 LAB — POCT URINALYSIS DIP (DEVICE)
Glucose, UA: NEGATIVE mg/dL
KETONES UR: 40 mg/dL — AB
NITRITE: POSITIVE — AB
PH: 6 (ref 5.0–8.0)
Protein, ur: >=300 mg/dL — AB
Specific Gravity, Urine: >=1.03 (ref 1.005–1.030)
Urobilinogen, UA: 1 mg/dL (ref 0.0–1.0)

## 2015-06-02 LAB — POCT PREGNANCY, URINE: Preg Test, Ur: NEGATIVE

## 2015-06-02 MED ORDER — CEPHALEXIN 500 MG PO CAPS: 500.0000 mg | ORAL_CAPSULE | Freq: Four times a day (QID) | ORAL | Status: DC

## 2015-06-02 NOTE — ED Notes (Signed)
Pt has been suffering from burning with urination, pain in her lower abdomen with urination, along with an odor.  Pt states it has been going on for 3 days.  She had similar symptoms about 2 months ago.

## 2015-06-02 NOTE — Discharge Instructions (Signed)
Take all of medicine as directed, drink lots of fluids, see your doctor if further problems. °

## 2015-06-02 NOTE — ED Provider Notes (Signed)
CSN: [id number]     Arrival date & time 06/02/15  1322 History   First MD Initiated Contact with Patient 06/02/15 1537     Chief Complaint  Patient presents with  . Urinary Tract Infection   (Consider location/radiation/quality/duration/timing/severity/associated sxs/prior Treatment) Patient is a 19 y.o. female presenting with urinary tract infection. The history is provided by the patient.  Urinary Tract Infection Pain quality:  Burning Pain severity:  Mild Onset quality:  Gradual Duration:  3 days Progression:  Unchanged Chronicity:  New Recent urinary tract infections: no   Relieved by:  None tried Worsened by:  Nothing tried Ineffective treatments:  None tried Urinary symptoms: foul-smelling urine and frequent urination   Associated symptoms: no abdominal pain, no fever, no flank pain, no nausea, no vaginal discharge and no vomiting     Past Medical History  Diagnosis Date  . Gonorrhea   . BV (bacterial vaginosis)   . Vaginal yeast infection   . Chlamydia    Past Surgical History  Procedure Laterality Date  . No past surgeries     Family History  Problem Relation Age of Onset  . Diabetes Maternal Uncle   . Cancer Maternal Grandmother     pancreatic   History  Substance Use Topics  . Smoking status: Never Smoker   . Smokeless tobacco: Never Used  . Alcohol Use: No   OB History    Gravida Para Term Preterm AB TAB SAB Ectopic Multiple Living   0     Review of Systems  Constitutional: Negative.  Negative for fever.  Gastrointestinal: Negative.  Negative for nausea, vomiting and abdominal pain.  Genitourinary: Positive for dysuria, urgency and frequency. Negative for flank pain, vaginal discharge and menstrual problem.    Allergies  Review of patient's allergies indicates no known allergies.  Home Medications   Prior to Admission medications   Medication Sig Start Date End Date Taking? Authorizing Provider  cephALEXin (KEFLEX) 500 MG capsule  Take 1 capsule (500 mg total) by mouth 4 (four) times daily. Take all of medicine and drink lots of fluids 06/02/15   Linna Hoff, MD  norgestimate-ethinyl estradiol (ORTHO-CYCLEN, 28,) 0.25-35 MG-MCG tablet Take 1 tablet by mouth daily. 10/21/14   Misty Stanley A Leftwich-Kirby, CNM   BP 159/76 mmHg  Pulse 66  Temp(Src) 97.7 F (36.5 C) (Oral)  Resp 16  SpO2 100%  LMP 05/14/2015 (Exact Date) Physical Exam  Constitutional: She is oriented to person, place, and time. She appears well-developed and well-nourished. No distress.  Abdominal: Soft. Bowel sounds are normal. She exhibits no distension and no mass. There is no tenderness. There is no rebound and no guarding.  Neurological: She is alert and oriented to person, place, and time.  Skin: Skin is warm and dry.  Nursing note and vitals reviewed.   ED Course  Procedures (including critical care time) Labs Review Labs Reviewed  POCT URINALYSIS DIP (DEVICE) - Abnormal; Notable for the following:    Bilirubin Urine SMALL (*)    Ketones, ur 40 (*)    Hgb urine dipstick LARGE (*)    Protein, ur >=300 (*)    Nitrite POSITIVE (*)    Leukocytes, UA TRACE (*)    All other components within normal limits  u/a as noted  Imaging Review No results found.   MDM   1. UTI (lower urinary tract infection)        Linna Hoff, MD 06/02/15 [phone]

## 2015-06-10 ENCOUNTER — Encounter (HOSPITAL_COMMUNITY): Payer: Self-pay | Admitting: Emergency Medicine

## 2015-06-10 ENCOUNTER — Emergency Department (HOSPITAL_COMMUNITY)
Admission: EM | Admit: 2015-06-10 | Discharge: 2015-06-10 | Disposition: A | Discharge status: Home / Self Care | Payer: Medicaid Other | Attending: Emergency Medicine | Admitting: Emergency Medicine

## 2015-06-10 DIAGNOSIS — Z3202 Encounter for pregnancy test, result negative: Secondary | ICD-10-CM | POA: Diagnosis not present

## 2015-06-10 DIAGNOSIS — R1030 Lower abdominal pain, unspecified: Secondary | ICD-10-CM | POA: Insufficient documentation

## 2015-06-10 DIAGNOSIS — Z8742 Personal history of other diseases of the female genital tract: Secondary | ICD-10-CM | POA: Insufficient documentation

## 2015-06-10 DIAGNOSIS — Z87440 Personal history of urinary (tract) infections: Secondary | ICD-10-CM | POA: Diagnosis not present

## 2015-06-10 DIAGNOSIS — Z8619 Personal history of other infectious and parasitic diseases: Secondary | ICD-10-CM | POA: Insufficient documentation

## 2015-06-10 DIAGNOSIS — N898 Other specified noninflammatory disorders of vagina: Secondary | ICD-10-CM | POA: Diagnosis not present

## 2015-06-10 DIAGNOSIS — R350 Frequency of micturition: Secondary | ICD-10-CM | POA: Diagnosis not present

## 2015-06-10 LAB — URINALYSIS, ROUTINE W REFLEX MICROSCOPIC
Bilirubin Urine: NEGATIVE
Glucose, UA: NEGATIVE mg/dL
Hgb urine dipstick: NEGATIVE
KETONES UR: NEGATIVE mg/dL
Leukocytes, UA: NEGATIVE
Nitrite: NEGATIVE
Protein, ur: NEGATIVE mg/dL
Specific Gravity, Urine: 1.015 (ref 1.005–1.030)
Urobilinogen, UA: 1 mg/dL (ref 0.0–1.0)
pH: 6.5 (ref 5.0–8.0)

## 2015-06-10 LAB — PREGNANCY, URINE: PREG TEST UR: NEGATIVE

## 2015-06-10 LAB — WET PREP, GENITAL
Clue Cells Wet Prep HPF POC: NONE SEEN
TRICH WET PREP: NONE SEEN
WBC, Wet Prep HPF POC: NONE SEEN
Yeast Wet Prep HPF POC: NONE SEEN

## 2015-06-10 NOTE — ED Notes (Signed)
Patient states cannot urinate at this time.  Gave her a cup of water per her request after okaying with Dr. Silverio Lay.  Patient has taken all monitor equipment off while sitting in bed.

## 2015-06-10 NOTE — ED Notes (Signed)
Patient states lower abdominal pain x 1 week.   Urinary frequency.  Denies other symptoms.

## 2015-06-10 NOTE — Discharge Instructions (Signed)
Continue taking excedrin for pain.   We sent off some labs that will not come back for several days. You will be called if results are positive.   See your doctor.   Return to ER if you have worse pelvic pain, abdominal pain, vomiting.

## 2015-06-10 NOTE — ED Provider Notes (Signed)
CSN: [id number]     Arrival date & time 06/10/15  0900 History   First MD Initiated Contact with Patient 06/10/15 [phone]     Chief Complaint  Patient presents with  . Abdominal Pain     (Consider location/radiation/quality/duration/timing/severity/associated sxs/prior Treatment) The history is provided by the patient.  Terry [name] is a 19 y.o. female hx of BV, chlamydia, UTI here with lower abdominal pain. Patient was recently treated for urinary tract infection about a week ago. She finished the antibiotic and now she has frequent urination again. Also lower abdominal pain. Denies fever or vomiting. Has chronic vaginal discharge, unchanged. Denies new sexual partners.     Past Medical History  Diagnosis Date  . Gonorrhea   . BV (bacterial vaginosis)   . Vaginal yeast infection   . Chlamydia    Past Surgical History  Procedure Laterality Date  . No past surgeries     Family History  Problem Relation Age of Onset  . Diabetes Maternal Uncle   . Cancer Maternal Grandmother     pancreatic   History  Substance Use Topics  . Smoking status: Never Smoker   . Smokeless tobacco: Never Used  . Alcohol Use: No   OB History    Gravida Para Term Preterm AB TAB SAB Ectopic Multiple Living   0     Review of Systems  Gastrointestinal: Positive for abdominal pain.  Genitourinary: Positive for frequency.  All other systems reviewed and are negative.     Allergies  Review of patient's allergies indicates no known allergies.  Home Medications   Prior to Admission medications   Not on File   BP 115/84 mmHg  Pulse 61  Temp(Src) 98.2 F (36.8 C) (Oral)  Resp 18  Ht  (1.778 m)  Wt 140 lb (63.504 kg)  BMI 20.09 kg/m2  SpO2 100%  LMP 05/14/2015 (Exact Date) Physical Exam  Constitutional: She is oriented to person, place, and time. She appears well-developed and well-nourished.  HENT:  Head: Normocephalic.  Mouth/Throat: Oropharynx is clear and moist.   Eyes: Pupils are equal, round, and reactive to light.  Neck: Normal range of motion. Neck supple.  Cardiovascular: Normal rate, regular rhythm and normal heart sounds.   Pulmonary/Chest: Effort normal and breath sounds normal. No respiratory distress. She has no wheezes. She has no rales.  Abdominal: Soft. Bowel sounds are normal.  + suprapubic tenderness.   Genitourinary:  Whitish discharge, no CMT, no adnexal tenderness   Musculoskeletal: Normal range of motion. She exhibits no edema or tenderness.  Neurological: She is alert and oriented to person, place, and time. No cranial nerve deficit. Coordination normal.  Skin: Skin is warm and dry.  Psychiatric: She has a normal mood and affect. Her behavior is normal. Judgment and thought content normal.  Nursing note and vitals reviewed.   ED Course  Procedures (including critical care time) Labs Review Labs Reviewed  WET PREP, GENITAL  URINALYSIS, ROUTINE W REFLEX MICROSCOPIC (NOT AT [facility])  PREGNANCY, URINE  GC/CHLAMYDIA PROBE AMP ([place]) NOT AT [facility]    Imaging Review No results found.   EKG Interpretation None      MDM   Final diagnoses:  None   Terry [name] is a 19 y.o. female here with urinary frequency. Will get UA, pregnancy.   11:35 AM UA nl today. Pelvic done. Wet prep nl. GC/chlamydia sent. Has been with boyfriend for awhile and doesn't want empiric  treatment for STDs. Nontender abdominal exam and pelvic exam. I doubt torsion or UTI or PID. Will dc home. Urine culture sent.    Richardean Canal, MD 06/10/15 1136

## 2015-06-11 LAB — GC/CHLAMYDIA PROBE AMP (~~LOC~~) NOT AT ARMC
Chlamydia: POSITIVE — AB
Neisseria Gonorrhea: NEGATIVE

## 2015-06-11 LAB — URINE CULTURE

## 2015-06-12 ENCOUNTER — Telehealth (HOSPITAL_COMMUNITY): Payer: Self-pay

## 2015-06-12 NOTE — Telephone Encounter (Signed)
Positive for chlamydia. Chart sent to EDP office for review.  

## 2015-06-15 ENCOUNTER — Telehealth (HOSPITAL_COMMUNITY): Payer: Self-pay | Admitting: Emergency Medicine

## 2015-06-15 NOTE — Telephone Encounter (Signed)
Post ED Visit - Positive Culture Follow-up: Successful Patient Follow-Up   Positive Chlamydia culture   Patient discharged without antimicrobial prescription and treatment is now indicated  Organism is resistant to prescribed ED discharge antimicrobial  Patient with positive blood cultures  Changes discussed with ED provider: Wynetta Emery PA New antibiotic prescription: Azithromycin 1,000 mg PO once Called to [facility] [phone]  Contacted patient, date 06/15/15, time 1211 ID verified, patient notified of positive chlamydia and need for treatment. STD instructions provided, patient verbalized understanding. RX called to [facility] [phone].   Jiles Harold 06/15/2015, 12:25 PM

## 2015-07-21 ENCOUNTER — Emergency Department (HOSPITAL_COMMUNITY)
Admission: EM | Admit: 2015-07-21 | Discharge: 2015-07-21 | Disposition: A | Discharge status: Home / Self Care | Payer: Medicaid Other | Attending: Emergency Medicine | Admitting: Emergency Medicine

## 2015-07-21 ENCOUNTER — Encounter (HOSPITAL_COMMUNITY): Payer: Self-pay | Admitting: *Deleted

## 2015-07-21 ENCOUNTER — Emergency Department (HOSPITAL_COMMUNITY): Payer: Medicaid Other

## 2015-07-21 DIAGNOSIS — N939 Abnormal uterine and vaginal bleeding, unspecified: Secondary | ICD-10-CM

## 2015-07-21 DIAGNOSIS — R05 Cough: Secondary | ICD-10-CM | POA: Diagnosis not present

## 2015-07-21 DIAGNOSIS — Z8619 Personal history of other infectious and parasitic diseases: Secondary | ICD-10-CM | POA: Insufficient documentation

## 2015-07-21 DIAGNOSIS — N730 Acute parametritis and pelvic cellulitis: Secondary | ICD-10-CM | POA: Diagnosis not present

## 2015-07-21 LAB — I-STAT CHEM 8, ED
BUN: 6 mg/dL (ref 6–20)
Calcium, Ion: 1.22 mmol/L (ref 1.12–1.23)
Chloride: 104 mmol/L (ref 101–111)
Creatinine, Ser: 0.7 mg/dL (ref 0.44–1.00)
Glucose, Bld: 88 mg/dL (ref 65–99)
HEMATOCRIT: 44 % (ref 36.0–46.0)
HEMOGLOBIN: 15 g/dL (ref 12.0–15.0)
Potassium: 4.5 mmol/L (ref 3.5–5.1)
SODIUM: 140 mmol/L (ref 135–145)
TCO2: 24 mmol/L (ref 0–100)

## 2015-07-21 LAB — COMPREHENSIVE METABOLIC PANEL
ALT: 13 U/L — ABNORMAL LOW (ref 14–54)
ANION GAP: 8 (ref 5–15)
AST: 22 U/L (ref 15–41)
Albumin: 4.2 g/dL (ref 3.5–5.0)
Alkaline Phosphatase: 73 U/L (ref 38–126)
BUN: 6 mg/dL (ref 6–20)
CO2: 26 mmol/L (ref 22–32)
Calcium: 9.5 mg/dL (ref 8.9–10.3)
Chloride: 105 mmol/L (ref 101–111)
Creatinine, Ser: 0.75 mg/dL (ref 0.44–1.00)
GFR calc Af Amer: >60 mL/min (ref >60)
Glucose, Bld: 97 mg/dL (ref 65–99)
POTASSIUM: 3.5 mmol/L (ref 3.5–5.1)
Sodium: 139 mmol/L (ref 135–145)
Total Bilirubin: 1.1 mg/dL (ref 0.3–1.2)
Total Protein: 7.6 g/dL (ref 6.5–8.1)

## 2015-07-21 LAB — WET PREP, GENITAL
TRICH WET PREP: NONE SEEN
YEAST WET PREP: NONE SEEN

## 2015-07-21 LAB — CBC
HCT: 42.9 % (ref 36.0–46.0)
Hemoglobin: 14.5 g/dL (ref 12.0–15.0)
MCH: 32.6 pg (ref 26.0–34.0)
MCHC: 33.8 g/dL (ref 30.0–36.0)
MCV: 96.4 fL (ref 78.0–100.0)
Platelets: 347 10*3/uL (ref 150–400)
RBC: 4.45 MIL/uL (ref 3.87–5.11)
RDW: 12 % (ref 11.5–15.5)
WBC: 7.6 10*3/uL (ref 4.0–10.5)

## 2015-07-21 LAB — URINALYSIS, ROUTINE W REFLEX MICROSCOPIC
BILIRUBIN URINE: NEGATIVE
Glucose, UA: NEGATIVE mg/dL
Hgb urine dipstick: NEGATIVE
KETONES UR: NEGATIVE mg/dL
Leukocytes, UA: NEGATIVE
NITRITE: NEGATIVE
Protein, ur: NEGATIVE mg/dL
SPECIFIC GRAVITY, URINE: 1.025 (ref 1.005–1.030)
UROBILINOGEN UA: 1 mg/dL (ref 0.0–1.0)
pH: 6 (ref 5.0–8.0)

## 2015-07-21 LAB — I-STAT BETA HCG BLOOD, ED (MC, WL, AP ONLY): I-stat hCG, quantitative: <5 m[IU]/mL (ref <5)

## 2015-07-21 LAB — LIPASE, BLOOD: Lipase: 22 U/L (ref 22–51)

## 2015-07-21 MED ORDER — DOXYCYCLINE HYCLATE 100 MG PO TABS
100.0000 mg | ORAL_TABLET | Freq: Once | ORAL | Status: AC
  Administered 2015-07-21: 100 mg via ORAL
  Filled 2015-07-21: qty 1

## 2015-07-21 MED ORDER — ONDANSETRON HCL 4 MG/2ML IJ SOLN
4.0000 mg | Freq: Once | INTRAMUSCULAR | Status: AC
  Administered 2015-07-21: 4 mg via INTRAVENOUS
  Filled 2015-07-21: qty 2

## 2015-07-21 MED ORDER — METRONIDAZOLE 500 MG PO TABS
2000.0000 mg | ORAL_TABLET | Freq: Once | ORAL | Status: AC
  Administered 2015-07-21: 2000 mg via ORAL
  Filled 2015-07-21: qty 4

## 2015-07-21 MED ORDER — METRONIDAZOLE 500 MG PO TABS: 500.0000 mg | ORAL_TABLET | Freq: Two times a day (BID) | ORAL | Status: DC

## 2015-07-21 MED ORDER — IOHEXOL 300 MG/ML  SOLN: 25.0000 mL | Freq: Once | INTRAMUSCULAR | Status: DC | PRN

## 2015-07-21 MED ORDER — MORPHINE SULFATE (PF) 4 MG/ML IV SOLN
4.0000 mg | Freq: Once | INTRAVENOUS | Status: AC
  Administered 2015-07-21: 4 mg via INTRAVENOUS
  Filled 2015-07-21: qty 1

## 2015-07-21 MED ORDER — CEFTRIAXONE SODIUM 1 G IJ SOLR
1.0000 g | Freq: Once | INTRAMUSCULAR | Status: AC
  Administered 2015-07-21: 1 g via INTRAVENOUS
  Filled 2015-07-21: qty 10

## 2015-07-21 MED ORDER — IOHEXOL 300 MG/ML  SOLN
100.0000 mL | Freq: Once | INTRAMUSCULAR | Status: AC | PRN
  Administered 2015-07-21: 100 mL via INTRAVENOUS

## 2015-07-21 MED ORDER — AZITHROMYCIN 250 MG PO TABS
1000.0000 mg | ORAL_TABLET | Freq: Once | ORAL | Status: AC
  Administered 2015-07-21: 1000 mg via ORAL
  Filled 2015-07-21: qty 4

## 2015-07-21 MED ORDER — DOXYCYCLINE HYCLATE 100 MG PO CAPS: 100.0000 mg | ORAL_CAPSULE | Freq: Two times a day (BID) | ORAL | Status: DC

## 2015-07-21 NOTE — ED Provider Notes (Signed)
CSN: [id number]     Arrival date & time 07/21/15  1133 History  This chart was scribed for non-physician practitioner, Dierdre Forth, PA-C, working with Gilda Crease, MD, by Budd Palmer ED Scribe. This patient was seen in room TR10C/TR10C and the patient's care was started at 4:23 PM     Chief Complaint  Patient presents with  . Vaginal Bleeding   The history is provided by the patient and medical records. No language interpreter was used.   HPI Comments: Terry [name] is a 19 y.o. female who presents to the Emergency Department complaining of abnormal vaginal bleeding onset 4 days ago. She states she only sees the blood when she urinates, but denies other urinary symptoms. She reports associated vaginal discharge, decreased appetite and lower abdominal pain. She also states she has been feeling sick with a cough and loss of appetite for the past week. She notes she was recently treated with 4 pills for an STD 1 month ago. She states she was not given an antibiotic. She has f/u with her PCP where she was re-tested, but was never called back with results. Pt is sexually active with 1 sexual partner for the past year. Pt notes her partner has been treated for the same STD as well. She states she has been using protection, but has not had sex for the past month. Pt denies nausea, vomiting, fever, chills. LMP: 07/07/2015  Review shows that patient was treated for chlamydia approximately one month ago.   At that time she tested negative for gonorrhea.  Past Medical History  Diagnosis Date  . Gonorrhea   . BV (bacterial vaginosis)   . Vaginal yeast infection   . Chlamydia    Past Surgical History  Procedure Laterality Date  . No past surgeries     Family History  Problem Relation Age of Onset  . Diabetes Maternal Uncle   . Cancer Maternal Grandmother     pancreatic   Social History  Substance Use Topics  . Smoking status: Never Smoker   . Smokeless tobacco: Never Used   . Alcohol Use: No   OB History    Gravida Para Term Preterm AB TAB SAB Ectopic Multiple Living   1    1  1    0     Review of Systems  Constitutional: Positive for appetite change. Negative for fever, diaphoresis, fatigue and unexpected weight change.  HENT: Negative for mouth sores.   Eyes: Negative for visual disturbance.  Respiratory: Positive for cough. Negative for chest tightness, shortness of breath and wheezing.   Cardiovascular: Negative for chest pain.  Gastrointestinal: Positive for abdominal pain. Negative for nausea, vomiting, diarrhea and constipation.  Endocrine: Negative for polydipsia, polyphagia and polyuria.  Genitourinary: Positive for vaginal bleeding and vaginal discharge. Negative for dysuria, urgency, frequency and hematuria.  Musculoskeletal: Negative for back pain and neck stiffness.  Skin: Negative for rash.  Allergic/Immunologic: Negative for immunocompromised state.  Neurological: Negative for syncope, light-headedness and headaches.  Hematological: Does not bruise/bleed easily.  Psychiatric/Behavioral: Negative for sleep disturbance. The patient is not nervous/anxious.    Allergies  Review of patient's allergies indicates no known allergies.  Home Medications   Prior to Admission medications   Medication Sig Start Date End Date Taking? Authorizing Provider  doxycycline (VIBRAMYCIN) 100 MG capsule Take 1 capsule (100 mg total) by mouth 2 (two) times daily. 07/21/15   Telly Broberg, PA-C  metroNIDAZOLE (FLAGYL) 500 MG tablet Take 1 tablet (500 mg total) by mouth  2 (two) times daily. 07/21/15   Henson Fraticelli, PA-C   BP 130/73 mmHg  Pulse 75  Temp(Src) 97.7 F (36.5 C) (Oral)  Resp 18  Ht 5\' 10"  (1.778 m)  Wt 135 lb (61.236 kg)  BMI 19.37 kg/m2  SpO2 100%  LMP 07/07/2015 Physical Exam  Constitutional: She appears well-developed and well-nourished. No distress.  HENT:  Head: Normocephalic and atraumatic.  Eyes: Conjunctivae are  normal.  Neck: Normal range of motion.  Cardiovascular: Normal rate, regular rhythm, normal heart sounds and intact distal pulses.   No murmur heard. Pulmonary/Chest: Effort normal and breath sounds normal. No respiratory distress. She has no wheezes.  Abdominal: Soft. Bowel sounds are normal. There is generalized tenderness. There is guarding. There is no rebound and no CVA tenderness. Hernia confirmed negative in the right inguinal area and confirmed negative in the left inguinal area.  Abdomen is generally tender with guarding in the bilateral lower quadrants  Genitourinary: Uterus normal. No labial fusion. There is no rash, tenderness or lesion on the right labia. There is no rash, tenderness or lesion on the left labia. Uterus is not deviated, not enlarged, not fixed and not tender. Cervix exhibits motion tenderness, discharge and friability. Right adnexum displays tenderness. Right adnexum displays no mass and no fullness. Left adnexum displays tenderness. Left adnexum displays no mass and no fullness. No erythema, tenderness or bleeding in the vagina. No foreign body around the vagina. No signs of injury around the vagina. Vaginal discharge (Thick, malodorous, brown, copious from the cervical os) found.  Cervix is friable with cervical motion tenderness and copious amounts of discharge Bilateral adnexal tenderness without masses  Musculoskeletal: Normal range of motion. She exhibits no edema.  Lymphadenopathy:       Right: No inguinal adenopathy present.       Left: No inguinal adenopathy present.  Neurological: She is alert.  Skin: Skin is warm and dry. She is not diaphoretic. No erythema.  Psychiatric: She has a normal mood and affect.  Nursing note and vitals reviewed.   ED Course  Procedures  DIAGNOSTIC STUDIES: Oxygen Saturation is 100% on RA, normal by my interpretation.    COORDINATION OF CARE: 4:31 PM - Performed pelvic exam (female chaperone present). Discussed possible PID.  Discussed plans to order a CT scan, antibiotics, and pain medication. Pt advised of plan for treatment and pt agrees.  Labs Review Labs Reviewed  WET PREP, GENITAL - Abnormal; Notable for the following:    Clue Cells Wet Prep HPF POC FEW (*)    WBC, Wet Prep HPF POC FEW (*)    All other components within normal limits  COMPREHENSIVE METABOLIC PANEL - Abnormal; Notable for the following:    ALT 13 (*)    All other components within normal limits  URINALYSIS, ROUTINE W REFLEX MICROSCOPIC (NOT AT [facility])  CBC  LIPASE, BLOOD  I-STAT BETA HCG BLOOD, ED ([facility], [facility], [facility] ONLY)  I-STAT CHEM 8, ED  GC/CHLAMYDIA PROBE AMP ([place]) NOT AT [facility]    Imaging Review Ct Abdomen Pelvis W Contrast  07/21/2015   CLINICAL DATA:  Vaginal bleeding for 2 days, nausea and vomiting  EXAM: CT ABDOMEN AND PELVIS WITH CONTRAST  TECHNIQUE: Multidetector CT imaging of the abdomen and pelvis was performed using the standard protocol following bolus administration of intravenous contrast.  CONTRAST:  OMNIPAQUE IOHEXOL 300 MG/ML  SOLN  COMPARISON:  None.  FINDINGS: Lung bases are unremarkable. Sagittal images of the spine are unremarkable. Enhanced liver,  pancreas, spleen and adrenal glands are unremarkable. No aortic aneurysm. Kidneys are symmetrical in size and enhancement. No hydronephrosis or hydroureter. No calcified gallstones are noted within gallbladder.  No small bowel obstruction. No ascites or free air. No adenopathy. There is no pericecal inflammation. Normal appendix is partially visualized in axial image 67. Moderate stool noted in distal sigmoid colon and rectum. There is retroflexed uterus. No adnexal masses noted. The urinary bladder is under distended. No inguinal adenopathy. Small amount of nonspecific fluid is noted within uterus. Some vaginal air is noted.  IMPRESSION: 1. No acute inflammatory process within abdomen. 2. No hydronephrosis or hydroureter. 3. Normal appendix.  No pericecal inflammation. 4.  There is retroflexed uterus. Small amount of nonspecific fluid is noted within uterus. No adnexal mass.   Electronically Signed   By: Natasha Mead M.D.   On: 07/21/2015 18:59   I have personally reviewed and evaluated these images and lab results as part of my medical decision-making.   EKG Interpretation None      MDM   Final diagnoses:  PID (acute pelvic inflammatory disease)  Vaginal bleeding    Steven Veazie presents approximately 4 weeks after treatment for chlamydia with bilateral lower abdominal pain and return of her vaginal discharge.  On exam charges concerning for infection and patient has cervical motion tenderness along with adnexal tenderness. Concern for PID. Will obtain labs and CT scan to rule out tubo-ovarian abscess.  7:25 PM Patient's pain is well controlled. She has been given antibiotics to treat for gonorrhea, chlamydia along with doxycycline and Flagyl.  Her urine is without evidence of urinary tract infection. She has no leukocytosis. CT scan is without appendicitis, tubo-ovarian abscess or obvious hydrosalpinx. Patient is tolerating by mouth at this time. She remained afebrile and without tachycardia. No emesis. Will treat as PID and have patient follow-up with OB/GYN within 3-5 days.  BP 130/73 mmHg  Pulse 75  Temp(Src) 97.7 F (36.5 C) (Oral)  Resp 18  Ht  (1.778 m)  Wt 135 lb (61.236 kg)  BMI 19.37 kg/m2  SpO2 100%  LMP 07/07/2015  I personally performed the services described in this documentation, which was scribed in my presence. The recorded information has been reviewed and is accurate.   Dahlia Client Joliyah Lippens, PA-C 07/21/15 1926  Gilda Crease, MD 07/22/15 [phone]

## 2015-07-21 NOTE — ED Notes (Signed)
Pt reports recent STD treatment and now having abnormal vaginal bleeding x 2 days.

## 2015-07-21 NOTE — ED Notes (Signed)
Patient transported to CT 

## 2015-07-21 NOTE — ED Notes (Signed)
Report given to Jessica, RN.

## 2015-07-21 NOTE — Discharge Instructions (Signed)
1. Medications: Doxycycline, Flagyl, usual home medications 2. Treatment: rest, drink plenty of fluids, use a condom with every sexual encounter 3. Follow Up: Please followup with your primary doctor in 3 days for discussion of your diagnoses and further evaluation after today's visit; if you do not have a primary care doctor use the resource guide provided to find one; Please return to the ER for worsening symptoms, high fevers or persistent vomiting.  You have been tested for HIV, syphilis, chlamydia and gonorrhea.  These results will be available in approximately 3 days.  Please inform all sexual partners if you test positive for any of these diseases.    Pelvic Inflammatory Disease Pelvic inflammatory disease (PID) refers to an infection in some or all of the female organs. The infection can be in the uterus, ovaries, fallopian tubes, or the surrounding tissues in the pelvis. PID can cause abdominal or pelvic pain that comes on suddenly (acute pelvic pain). PID is a serious infection because it can lead to lasting (chronic) pelvic pain or the inability to have children (infertile).  CAUSES  The infection is often caused by the normal bacteria found in the vaginal tissues. PID may also be caused by an infection that is spread during sexual contact. PID can also occur following:   The birth of a baby.   A miscarriage.   An abortion.   Major pelvic surgery.   The use of an intrauterine device (IUD).   A sexual assault.  RISK FACTORS Certain factors can put a person at higher risk for PID, such as:  Being younger than 25 years.  Being sexually active at [place] age.  Usingnonbarrier contraception.  Havingmultiple sexual partners.  Having sex with someone who has symptoms of a genital infection.  Using oral contraception. Other times, certain behaviors can increase the possibility of getting PID, such as:  Having sex during your period.  Using a vaginal  douche.  Having an intrauterine device (IUD) in place. SYMPTOMS   Abdominal or pelvic pain.   Fever.   Chills.   Abnormal vaginal discharge.  Abnormal uterine bleeding.   Unusual pain shortly after finishing your period. DIAGNOSIS  Your caregiver will choose some of the following methods to make a diagnosis, such as:   Performinga physical exam and history. A pelvic exam typically reveals a very tender uterus and surrounding pelvis.   Ordering laboratory tests including a pregnancy test, blood tests, and urine test.  Orderingcultures of the vagina and cervix to check for a sexually transmitted infection (STI).  Performing an ultrasound.   Performing a laparoscopic procedure to look inside the pelvis.  TREATMENT   Antibiotic medicines may be prescribed and taken by mouth.   Sexual partners may be treated when the infection is caused by a sexually transmitted disease (STD).   Hospitalization may be needed to give antibiotics intravenously.  Surgery may be needed, but this is rare. It may take weeks until you are completely well. If you are diagnosed with PID, you should also be checked for human immunodeficiency virus (HIV). HOME CARE INSTRUCTIONS   If given, take your antibiotics as directed. Finish the medicine even if you start to feel better.   Only take over-the-counter or prescription medicines for pain, discomfort, or fever as directed by your caregiver.   Do not have sexual intercourse until treatment is completed or as directed by your caregiver. If PID is confirmed, your recent sexual partner(s) will need treatment.   Keep your follow-up appointments. SEEK  MEDICAL CARE IF:   You have increased or abnormal vaginal discharge.   You need prescription medicine for your pain.   You vomit.   You cannot take your medicines.   Your partner has an STD.  SEEK IMMEDIATE MEDICAL CARE IF:   You have a fever.   You have increased  abdominal or pelvic pain.   You have chills.   You have pain when you urinate.   You are not better after 72 hours following treatment.  MAKE SURE YOU:   Understand these instructions.  Will watch your condition.  Will get help right away if you are not doing well or get worse. Document Released: 10/25/2005 Document Revised: 02/19/2013 Document Reviewed: 10/21/2011 [facility] Patient Information 2015 [place], [place]. This information is not intended to replace advice given to you by your health care provider. Make sure you discuss any questions you have with your health care provider.

## 2015-07-21 NOTE — ED Notes (Signed)
Alternate number for contact per pt [phone]

## 2015-07-22 LAB — GC/CHLAMYDIA PROBE AMP (~~LOC~~) NOT AT ARMC
CHLAMYDIA, DNA PROBE: NEGATIVE
Neisseria Gonorrhea: NEGATIVE

## 2015-07-28 ENCOUNTER — Encounter: Payer: Self-pay | Admitting: Advanced Practice Midwife

## 2015-07-28 ENCOUNTER — Ambulatory Visit (INDEPENDENT_AMBULATORY_CARE_PROVIDER_SITE_OTHER): Payer: Medicaid Other | Admitting: Advanced Practice Midwife

## 2015-07-28 VITALS — Temp 98.4°F | Ht 69.0 in | Wt 131.2 lb

## 2015-07-28 DIAGNOSIS — R102 Pelvic and perineal pain: Secondary | ICD-10-CM

## 2015-07-28 NOTE — Patient Instructions (Signed)
Contraception Choices Contraception (birth control) is the use of any methods or devices to prevent pregnancy. Below are some methods to help avoid pregnancy. HORMONAL METHODS   Contraceptive implant. This is a thin, plastic tube containing progesterone hormone. It does not contain estrogen hormone. Your health care provider inserts the tube in the inner part of the upper arm. The tube can remain in place for up to 3 years. After 3 years, the implant must be removed. The implant prevents the ovaries from releasing an egg (ovulation), thickens the cervical mucus to prevent sperm from entering the uterus, and thins the lining of the inside of the uterus.  Progesterone-only injections. These injections are given every 3 months by your health care provider to prevent pregnancy. This synthetic progesterone hormone stops the ovaries from releasing eggs. It also thickens cervical mucus and changes the uterine lining. This makes it harder for sperm to survive in the uterus.  Birth control pills. These pills contain estrogen and progesterone hormone. They work by preventing the ovaries from releasing eggs (ovulation). They also cause the cervical mucus to thicken, preventing the sperm from entering the uterus. Birth control pills are prescribed by a health care provider.Birth control pills can also be used to treat heavy periods.  Minipill. This type of birth control pill contains only the progesterone hormone. They are taken every day of each month and must be prescribed by your health care provider.  Birth control patch. The patch contains hormones similar to those in birth control pills. It must be changed once a week and is prescribed by a health care provider.  Vaginal ring. The ring contains hormones similar to those in birth control pills. It is left in the vagina for 3 weeks, removed for 1 week, and then a new one is put back in place. The patient must be comfortable inserting and removing the ring  from the vagina.A health care provider's prescription is necessary.  Emergency contraception. Emergency contraceptives prevent pregnancy after unprotected sexual intercourse. This pill can be taken right after sex or up to 5 days after unprotected sex. It is most effective the sooner you take the pills after having sexual intercourse. Most emergency contraceptive pills are available without a prescription. Check with your pharmacist. Do not use emergency contraception as your only form of birth control. BARRIER METHODS   Female condom. This is a thin sheath (latex or rubber) that is worn over the penis during sexual intercourse. It can be used with spermicide to increase effectiveness.  Female condom. This is a soft, loose-fitting sheath that is put into the vagina before sexual intercourse.  Diaphragm. This is a soft, latex, dome-shaped barrier that must be fitted by a health care provider. It is inserted into the vagina, along with a spermicidal jelly. It is inserted before intercourse. The diaphragm should be left in the vagina for 6 to 8 hours after intercourse.  Cervical cap. This is a round, soft, latex or plastic cup that fits over the cervix and must be fitted by a health care provider. The cap can be left in place for up to 48 hours after intercourse.  Sponge. This is a soft, circular piece of polyurethane foam. The sponge has spermicide in it. It is inserted into the vagina after wetting it and before sexual intercourse.  Spermicides. These are chemicals that kill or block sperm from entering the cervix and uterus. They come in the form of creams, jellies, suppositories, foam, or tablets. They do not require a   prescription. They are inserted into the vagina with an applicator before having sexual intercourse. The process must be repeated every time you have sexual intercourse. INTRAUTERINE CONTRACEPTION  Intrauterine device (IUD). This is a T-shaped device that is put in a woman's uterus  during a menstrual period to prevent pregnancy. There are 2 types:  Copper IUD. This type of IUD is wrapped in copper wire and is placed inside the uterus. Copper makes the uterus and fallopian tubes produce a fluid that kills sperm. It can stay in place for 10 years.  Hormone IUD. This type of IUD contains the hormone progestin (synthetic progesterone). The hormone thickens the cervical mucus and prevents sperm from entering the uterus, and it also thins the uterine lining to prevent implantation of a fertilized egg. The hormone can weaken or kill the sperm that get into the uterus. It can stay in place for 3-5 years, depending on which type of IUD is used. PERMANENT METHODS OF CONTRACEPTION  Female tubal ligation. This is when the woman's fallopian tubes are surgically sealed, tied, or blocked to prevent the egg from traveling to the uterus.  Hysteroscopic sterilization. This involves placing a small coil or insert into each fallopian tube. Your doctor uses a technique called hysteroscopy to do the procedure. The device causes scar tissue to form. This results in permanent blockage of the fallopian tubes, so the sperm cannot fertilize the egg. It takes about 3 months after the procedure for the tubes to become blocked. You must use another form of birth control for these 3 months.  Female sterilization. This is when the female has the tubes that carry sperm tied off (vasectomy).This blocks sperm from entering the vagina during sexual intercourse. After the procedure, the man can still ejaculate fluid (semen). NATURAL PLANNING METHODS  Natural family planning. This is not having sexual intercourse or using a barrier method (condom, diaphragm, cervical cap) on days the woman could become pregnant.  Calendar method. This is keeping track of the length of each menstrual cycle and identifying when you are fertile.  Ovulation method. This is avoiding sexual intercourse during ovulation.  Symptothermal  method. This is avoiding sexual intercourse during ovulation, using a thermometer and ovulation symptoms.  Post-ovulation method. This is timing sexual intercourse after you have ovulated. Regardless of which type or method of contraception you choose, it is important that you use condoms to protect against the transmission of sexually transmitted infections (STIs). Talk with your health care provider about which form of contraception is most appropriate for you. Document Released: 10/25/2005 Document Revised: 10/30/2013 Document Reviewed: 04/19/2013 ExitCare Patient Information 2015 [employer]. This information is not intended to replace advice given to you by your health care provider. Make sure you discuss any questions you have with your health care provider. Safe Sex Safe sex is about reducing the risk of giving or getting a sexually transmitted disease (STD). STDs are spread through sexual contact involving the genitals, mouth, or rectum. Some STDs can be cured and others cannot. Safe sex can also prevent unintended pregnancies.  WHAT ARE SOME SAFE SEX PRACTICES?  Limit your sexual activity to only one partner who is having sex with only you.  Talk to your partner about his or her past partners, past STDs, and drug use.  Use a condom every time you have sexual intercourse. This includes vaginal, oral, and anal sexual activity. Both females and males should wear condoms during oral sex. Only use latex or polyurethane condoms and water-based lubricants.   Using petroleum-based lubricants or oils to lubricate a condom will weaken the condom and increase the chance that it will break. The condom should be in place from the beginning to the end of sexual activity. Wearing a condom reduces, but does not completely eliminate, your risk of getting or giving an STD. STDs can be spread by contact with infected body fluids and skin.  Get vaccinated for hepatitis B and HPV.  Avoid alcohol and  recreational drugs, which can affect your judgment. You may forget to use a condom or participate in high-risk sex.  For females, avoid douching after sexual intercourse. Douching can spread an infection farther into the reproductive tract.  Check your body for signs of sores, blisters, rashes, or unusual discharge. See your health care provider if you notice any of these signs.  Avoid sexual contact if you have symptoms of an infection or are being treated for an STD. If you or your partner has herpes, avoid sexual contact when blisters are present. Use condoms at all other times.  If you are at risk of being infected with HIV, it is recommended that you take a prescription medicine daily to prevent HIV infection. This is called pre-exposure prophylaxis (PrEP). You are considered at risk if:  You are a man who has sex with other men (MSM).  You are a heterosexual man or woman who is sexually active with more than one partner.  You take drugs by injection.  You are sexually active with a partner who has HIV.  Talk with your health care provider about whether you are at high risk of being infected with HIV. If you choose to begin PrEP, you should first be tested for HIV. You should then be tested every 3 months for as long as you are taking PrEP.  See your health care provider for regular screenings, exams, and tests for other STDs. Before having sex with a new partner, each of you should be screened for STDs and should talk about the results with each other. WHAT ARE THE BENEFITS OF SAFE SEX?   There is less chance of getting or giving an STD.  You can prevent unwanted or unintended pregnancies.  By discussing safe sex concerns with your partner, you may increase feelings of intimacy, comfort, trust, and honesty between the two of you. Document Released: 12/02/2004 Document Revised: 03/11/2014 Document Reviewed: 04/17/2012 ExitCare Patient Information 2015 [employer]. This  information is not intended to replace advice given to you by your health care provider. Make sure you discuss any questions you have with your health care provider.  

## 2015-07-28 NOTE — Progress Notes (Signed)
   Subjective:    Patient ID: Terry [name], female    DOB: 1996-05-02, 19 y.o.   MRN: [id number]  This is a 19 y.o. female who presents for followup after being treated for presumed PID in the ED 5 days ago. Had cultures done as well as a CT scan. CT was negative. Cultures came back negative.  Denies pain now. States has taken Flagyl and Doxy.  Has been vomiting every time she takes the Doxy and has lost 10lbs.   Pelvic Pain The patient's primary symptoms include pelvic pain. The patient's pertinent negatives include no genital itching, genital lesions, genital odor, genital rash, vaginal bleeding or vaginal discharge. This is a new problem. The current episode started in the past 7 days. The problem occurs rarely. The problem has been resolved. The patient is experiencing no pain. The problem affects both sides. She is not pregnant. Pertinent negatives include no abdominal pain, back pain, constipation, diarrhea, dysuria, fever, nausea or vomiting. Nothing aggravates the symptoms. She has tried antibiotics for the symptoms. The treatment provided significant relief. She is not sexually active (broke up with boyfriend). She uses nothing for contraception.   Medical, Surgical, Family and Social histories reviewed and are listed above.  Medications and allergies reviewed.   Review of Systems  Constitutional: Negative for fever.  Gastrointestinal: Negative for nausea, vomiting, abdominal pain, diarrhea and constipation.  Genitourinary: Positive for pelvic pain. Negative for dysuria and vaginal discharge.  Musculoskeletal: Negative for back pain.  Other systems negative    Objective:   Physical Exam  Constitutional: She is oriented to person, place, and time. She appears well-developed and well-nourished. No distress.  HENT:  Head: Normocephalic.  Cardiovascular: Normal rate and regular rhythm.   Pulmonary/Chest: Effort normal. No respiratory distress.  Abdominal: Soft. She exhibits no  distension. There is no tenderness. There is no rebound and no guarding.  Genitourinary:  Pelvic exam deferred No abdominal pain now  Musculoskeletal: Normal range of motion.  Neurological: She is alert and oriented to person, place, and time.  Skin: Skin is warm and dry.  Psychiatric: She has a normal mood and affect.          Assessment & Plan:  A:  Presumed PID s/p treatment       Resolved pain  P:  Consulted Dr Adrian Blackwater.        May stop Doxy       Discussed need for condom use and contraception. States broke up with boyfriend.        Follow up PRN

## 2015-07-28 NOTE — Progress Notes (Signed)
Pt reports N/V and her food not tasting the same since taking antibiotics.  Pt also reports losing a lot of weight since antibiotic therapy.

## 2015-09-02 ENCOUNTER — Encounter: Payer: Self-pay | Admitting: *Deleted

## 2015-09-15 ENCOUNTER — Ambulatory Visit: Payer: Self-pay | Admitting: Obstetrics & Gynecology

## 2015-10-06 ENCOUNTER — Encounter (HOSPITAL_COMMUNITY): Payer: Self-pay | Admitting: Family Medicine

## 2015-10-06 ENCOUNTER — Emergency Department (HOSPITAL_COMMUNITY)
Admission: EM | Admit: 2015-10-06 | Discharge: 2015-10-06 | Disposition: A | Discharge status: Home / Self Care | Payer: Self-pay | Attending: Emergency Medicine | Admitting: Emergency Medicine

## 2015-10-06 DIAGNOSIS — Z792 Long term (current) use of antibiotics: Secondary | ICD-10-CM | POA: Insufficient documentation

## 2015-10-06 DIAGNOSIS — Z8619 Personal history of other infectious and parasitic diseases: Secondary | ICD-10-CM | POA: Insufficient documentation

## 2015-10-06 DIAGNOSIS — N898 Other specified noninflammatory disorders of vagina: Secondary | ICD-10-CM | POA: Insufficient documentation

## 2015-10-06 LAB — WET PREP, GENITAL
CLUE CELLS WET PREP: NONE SEEN
SPERM: NONE SEEN
TRICH WET PREP: NONE SEEN
YEAST WET PREP: NONE SEEN

## 2015-10-06 MED ORDER — FLUCONAZOLE 100 MG PO TABS
150.0000 mg | ORAL_TABLET | Freq: Once | ORAL | Status: AC
  Administered 2015-10-06: 150 mg via ORAL
  Filled 2015-10-06: qty 2

## 2015-10-06 NOTE — ED Notes (Signed)
Pt here for vaginal discharge with odor. sts she took monistat and not better. Denies pain

## 2015-10-06 NOTE — ED Provider Notes (Signed)
CSN: [id number]     Arrival date & time 10/06/15  1345 History  By signing my name below, I, Jarvis Morganaylor Ferguson, attest that this documentation has been prepared under the direction and in the presence of Kerrie BuffaloHope Yaniah Thiemann, NP Electronically Signed: Jarvis Morganaylor Ferguson, ED Scribe. 10/06/2015. 6:24 PM.    Chief Complaint  Patient presents with  . Vaginal Discharge   The history is provided by the patient. No language interpreter was used.    HPI Comments: Evlyn ClinesKiaheshia Gilden is a 19 y.o. female who presents to the Emergency Department complaining of intermittent, mild to moderate, clear, vaginal discharge onset 2 weeks. She reports associated abnormal vaginal odor and initial vaginal itching. She states she has never had anything like this before. Pt reports she has never been pregnant. She notes a h/o STIs and states she had gonorrhea several years ago. Pt endorses that she has been on monistat for 7 days which she states only provided relief for the vaginal itching but her other symptoms are still present. She denies any aggravating factors. Pt is not currently on birth control. She is not currently sexually activeegular ; she reports her last sexual activity was 4 months ago. Pt denies any daily medication use. She denies any vaginal bleeding, abdominal pain, dysuria, hematuria, vaginal lesions or other associated symptoms. She has NKDA.   Past Medical History  Diagnosis Date  . Gonorrhea   . BV (bacterial vaginosis)   . Vaginal yeast infection   . Chlamydia    Past Surgical History  Procedure Laterality Date  . No past surgeries     Family History  Problem Relation Age of Onset  . Diabetes Maternal Uncle   . Cancer Maternal Grandmother     pancreatic   Social History  Substance Use Topics  . Smoking status: Never Smoker   . Smokeless tobacco: Never Used  . Alcohol Use: No   OB History    Gravida Para Term Preterm AB TAB SAB Ectopic Multiple Living   1    1  1    0     Review of Systems   Genitourinary: Positive for vaginal discharge.  All other systems reviewed and are negative.     Allergies  Review of patient's allergies indicates no known allergies.  Home Medications   Prior to Admission medications   Medication Sig Start Date End Date Taking? Authorizing Provider  doxycycline (VIBRAMYCIN) 100 MG capsule Take 1 capsule (100 mg total) by mouth 2 (two) times daily. 07/21/15   Hannah Muthersbaugh, PA-C  metroNIDAZOLE (FLAGYL) 500 MG tablet Take 1 tablet (500 mg total) by mouth 2 (two) times daily. 07/21/15   Hannah Muthersbaugh, PA-C   Triage Vitals: BP 122/96 mmHg  Pulse 80  Temp(Src) 98 F (36.7 C)  Resp 18  SpO2 98%  LMP 10/04/2015  Physical Exam  Constitutional: She is oriented to person, place, and time. She appears well-developed and well-nourished. No distress.  HENT:  Head: Normocephalic.  Eyes: EOM are normal.  Neck: Neck supple.  Cardiovascular: Normal rate, regular rhythm and normal heart sounds.   Pulmonary/Chest: Effort normal and breath sounds normal.  Abdominal: Soft. Bowel sounds are normal. There is no tenderness. There is no CVA tenderness.  Genitourinary:  External genitalia without lesions, thick white d/c vaginal vault, no CMT, no adnexal tenderness or mass palpated. Uterus without palpable enlargement.   Musculoskeletal: Normal range of motion.  Neurological: She is alert and oriented to person, place, and time. No cranial nerve deficit.  Skin:  Skin is warm and dry.  Psychiatric: She has a normal mood and affect. Her behavior is normal.  Nursing note and vitals reviewed.   ED Course  Procedures (including critical care time)  DIAGNOSTIC STUDIES: Oxygen Saturation is 98% on RA, normal by my interpretation.    COORDINATION OF CARE:    Labs Review Results for orders placed or performed during the hospital encounter of 10/06/15 (from the past 24 hour(s))  Wet prep, genital     Status: Abnormal   Collection Time: 10/06/15  5:25  PM  Result Value Ref Range   Yeast Wet Prep HPF POC NONE SEEN NONE SEEN   Trich, Wet Prep NONE SEEN NONE SEEN   Clue Cells Wet Prep HPF POC NONE SEEN NONE SEEN   WBC, Wet Prep HPF POC FEW (A) NONE SEEN   Sperm NONE SEEN       MDM  19 y.o. female with vaginal d/c and itching not relieved with monistat. Will treat with Diflucan and patient will follow up with the health department for her regular check up. She will return here as needed. GC Chlamydia cultures pending.   Final diagnoses:  Vaginal discharge   I personally performed the services described in this documentation, which was scribed in my presence. The recorded information has been reviewed and is accurate.    [id number] [place], NP 10/06/15 1826  Leta Baptist, MD 10/10/15 [phone]

## 2015-10-06 NOTE — ED Notes (Signed)
Pt states that she has not had discharge since she took the monistat but is still smelling a foul odor.

## 2015-10-07 LAB — GC/CHLAMYDIA PROBE AMP (~~LOC~~) NOT AT ARMC
CHLAMYDIA, DNA PROBE: NEGATIVE
NEISSERIA GONORRHEA: NEGATIVE

## 2015-10-11 ENCOUNTER — Emergency Department (HOSPITAL_COMMUNITY)
Admission: EM | Admit: 2015-10-11 | Discharge: 2015-10-11 | Disposition: A | Discharge status: Home / Self Care | Payer: Self-pay | Attending: Emergency Medicine | Admitting: Emergency Medicine

## 2015-10-11 ENCOUNTER — Encounter (HOSPITAL_COMMUNITY): Payer: Self-pay | Admitting: Emergency Medicine

## 2015-10-11 DIAGNOSIS — N760 Acute vaginitis: Secondary | ICD-10-CM | POA: Insufficient documentation

## 2015-10-11 DIAGNOSIS — Z3202 Encounter for pregnancy test, result negative: Secondary | ICD-10-CM | POA: Insufficient documentation

## 2015-10-11 DIAGNOSIS — Z8619 Personal history of other infectious and parasitic diseases: Secondary | ICD-10-CM | POA: Insufficient documentation

## 2015-10-11 DIAGNOSIS — B9689 Other specified bacterial agents as the cause of diseases classified elsewhere: Secondary | ICD-10-CM

## 2015-10-11 DIAGNOSIS — Z792 Long term (current) use of antibiotics: Secondary | ICD-10-CM | POA: Insufficient documentation

## 2015-10-11 LAB — URINALYSIS, ROUTINE W REFLEX MICROSCOPIC
Glucose, UA: NEGATIVE mg/dL
Hgb urine dipstick: NEGATIVE
KETONES UR: 15 mg/dL — AB
Leukocytes, UA: NEGATIVE
NITRITE: NEGATIVE
PH: 5.5 (ref 5.0–8.0)
PROTEIN: 30 mg/dL — AB
Specific Gravity, Urine: 1.035 — ABNORMAL HIGH (ref 1.005–1.030)

## 2015-10-11 LAB — URINE MICROSCOPIC-ADD ON

## 2015-10-11 LAB — WET PREP, GENITAL
SPERM: NONE SEEN
Trich, Wet Prep: NONE SEEN
YEAST WET PREP: NONE SEEN

## 2015-10-11 LAB — POC URINE PREG, ED: Preg Test, Ur: NEGATIVE

## 2015-10-11 MED ORDER — METRONIDAZOLE 500 MG PO TABS: 500.0000 mg | ORAL_TABLET | Freq: Two times a day (BID) | ORAL | Status: DC

## 2015-10-11 NOTE — ED Provider Notes (Signed)
CSN: [id number]     Arrival date & time 10/11/15  0135 History  By signing my name below, I, Gonzella Lex, attest that this documentation has been prepared under the direction and in the presence of Azalia Bilis, MD. Electronically Signed: Gonzella Lex, Scribe. 10/11/2015. 2:47 AM.    Chief Complaint  Patient presents with  . Vaginal Itching    The history is provided by the patient. No language interpreter was used.    HPI Comments: Terry [name] is a 19 y.o. female who presents to the Emergency Department complaining of vaginal discharge, itching, swelling, and burning sensations in her labia onset two days ago which have since returned yesterday. She also reports a fowl smell today as well. Pt reports that after her visit to the ER two days ago she was given yeast infection medication in the ED which did not resolve her symptoms. Pt states that she has never had this issue before. She reports her LNMP was November 22nd-26th. Pt does not have a PCP or OBGYN. Pt has previously been diagnosed with bacterial vaginosis and UTI upon her past visits to the ED and OBGYN clinics.   Past Medical History  Diagnosis Date  . Gonorrhea   . BV (bacterial vaginosis)   . Vaginal yeast infection   . Chlamydia    Past Surgical History  Procedure Laterality Date  . No past surgeries     Family History  Problem Relation Age of Onset  . Diabetes Maternal Uncle   . Cancer Maternal Grandmother     pancreatic   Social History  Substance Use Topics  . Smoking status: Never Smoker   . Smokeless tobacco: Never Used  . Alcohol Use: No   OB History    Gravida Para Term Preterm AB TAB SAB Ectopic Multiple Living   0     Review of Systems A complete 10 system review of systems was obtained and all systems are negative except as noted in the HPI and PMH.   Allergies  Review of patient's allergies indicates no known allergies.  Home Medications   Prior to Admission  medications   Medication Sig Start Date End Date Taking? Authorizing Provider  doxycycline (VIBRAMYCIN) 100 MG capsule Take 1 capsule (100 mg total) by mouth 2 (two) times daily. 07/21/15   Hannah Muthersbaugh, PA-C  metroNIDAZOLE (FLAGYL) 500 MG tablet Take 1 tablet (500 mg total) by mouth 2 (two) times daily. 07/21/15   Hannah Muthersbaugh, PA-C   BP 102/75 mmHg  Pulse 103  Temp(Src) 98 F (36.7 C) (Oral)  Resp 18  Ht  (1.753 m)  Wt 135 lb (61.236 kg)  BMI 19.93 kg/m2  SpO2 99%  LMP 10/04/2015 Physical Exam  Constitutional: She is oriented to person, place, and time. She appears well-developed and well-nourished. No distress.  HENT:  Head: Normocephalic.  Eyes: EOM are normal.  Neck: Normal range of motion.  Cardiovascular: Normal rate.   Pulmonary/Chest: Effort normal.  Abdominal: Soft. She exhibits no distension. There is no tenderness.  Genitourinary:  External genitalia without abnormality.  Labia normal in appearance.  No erythema warmth or focal tenderness of her external genitalia.  Pelvic examination demonstrates mild cervical and vaginal white creamy discharge with odor.  No cervical motion tenderness.  No cervical lesions noted.   Musculoskeletal: Normal range of motion.  Neurological: She is alert and oriented to person, place, and time.  Skin: Skin is warm and  dry.  Psychiatric: She has a normal mood and affect. Judgment normal.  Nursing note and vitals reviewed.   ED Course  Procedures  DIAGNOSTIC STUDIES:    Oxygen Saturation is 99% on RA, normal by my interpretation.   COORDINATION OF CARE:  2:37 AM Advise pt to follow up with clinic gynecologist. Discussed treatment plan with pt at bedside and pt agreed to plan.    Labs Review Labs Reviewed  WET PREP, GENITAL - Abnormal; Notable for the following:    Clue Cells Wet Prep HPF POC PRESENT (*)    WBC, Wet Prep HPF POC MODERATE (*)    All other components within normal limits  URINALYSIS, ROUTINE W  REFLEX MICROSCOPIC (NOT AT [facility]) - Abnormal; Notable for the following:    Color, Urine AMBER (*)    APPearance CLOUDY (*)    Specific Gravity, Urine 1.035 (*)    Bilirubin Urine SMALL (*)    Ketones, ur 15 (*)    Protein, ur 30 (*)    All other components within normal limits  URINE MICROSCOPIC-ADD ON - Abnormal; Notable for the following:    Squamous Epithelial / LPF 6-30 (*)    Bacteria, UA FEW (*)    All other components within normal limits  POC URINE PREG, ED    Imaging Review No results found. I have personally reviewed and evaluated these lab results as part of my medical decision-making.   EKG Interpretation None      MDM   Final diagnoses:  BV (bacterial vaginosis)    Patiently cover for bacterial vaginosis as she does have clue cells on her wet prep today.  She does have white vaginal discharge.  There is a odor to it.  That are a Chlamydia was -2 days ago.  This will not be repeated.  Home with Flagyl twice a day 1 week.  Gynecology follow-up  I personally performed the services described in this documentation, which was scribed in my presence. The recorded information has been reviewed and is accurate.       Azalia BilisKevin Sung Parodi, MD 10/11/15 [phone]

## 2015-10-11 NOTE — Discharge Instructions (Signed)

## 2015-10-11 NOTE — ED Notes (Signed)
Pt. reports persistent vaginal itching and malodorous vaginal discharge unrelieved by prescription Diflucan prescribed here last 10/06/2015 , denies dysuria / no fever or chills.

## 2015-11-14 ENCOUNTER — Inpatient Hospital Stay (HOSPITAL_COMMUNITY)
Admission: AD | Admit: 2015-11-14 | Discharge: 2015-11-14 | Disposition: A | Discharge status: Home / Self Care | Payer: Self-pay | Source: Ambulatory Visit | Attending: Obstetrics and Gynecology | Admitting: Obstetrics and Gynecology

## 2015-11-14 DIAGNOSIS — B3731 Acute candidiasis of vulva and vagina: Secondary | ICD-10-CM

## 2015-11-14 DIAGNOSIS — N898 Other specified noninflammatory disorders of vagina: Secondary | ICD-10-CM | POA: Insufficient documentation

## 2015-11-14 DIAGNOSIS — Z114 Encounter for screening for human immunodeficiency virus [HIV]: Secondary | ICD-10-CM

## 2015-11-14 DIAGNOSIS — Z113 Encounter for screening for infections with a predominantly sexual mode of transmission: Secondary | ICD-10-CM

## 2015-11-14 DIAGNOSIS — B373 Candidiasis of vulva and vagina: Secondary | ICD-10-CM | POA: Insufficient documentation

## 2015-11-14 LAB — URINE MICROSCOPIC-ADD ON
Bacteria, UA: NONE SEEN
RBC / HPF: NONE SEEN RBC/hpf (ref 0–5)

## 2015-11-14 LAB — URINALYSIS, ROUTINE W REFLEX MICROSCOPIC
BILIRUBIN URINE: NEGATIVE
GLUCOSE, UA: NEGATIVE mg/dL
HGB URINE DIPSTICK: NEGATIVE
Ketones, ur: NEGATIVE mg/dL
Nitrite: NEGATIVE
PH: 6.5 (ref 5.0–8.0)
Protein, ur: 30 mg/dL — AB
SPECIFIC GRAVITY, URINE: 1.02 (ref 1.005–1.030)

## 2015-11-14 LAB — POCT PREGNANCY, URINE: PREG TEST UR: NEGATIVE

## 2015-11-14 LAB — WET PREP, GENITAL
Clue Cells Wet Prep HPF POC: NONE SEEN
SPERM: NONE SEEN
Trich, Wet Prep: NONE SEEN

## 2015-11-14 MED ORDER — NYSTATIN 100000 UNIT/GM EX CREA: TOPICAL_CREAM | CUTANEOUS | Status: DC

## 2015-11-14 MED ORDER — FLUCONAZOLE 150 MG PO TABS: ORAL_TABLET | ORAL | Status: DC

## 2015-11-14 NOTE — MAU Provider Note (Signed)
History     CSN: [id number]  Arrival date and time: 11/14/15 1739   First Provider Initiated Contact with Patient 11/14/15 1759      Chief Complaint  Patient presents with  . Vaginal Discharge   HPI [name] is a 20 y.o. G1P0010 who presents to MAU today with complaint of vaginal discharge and swelling. The patient states that she noted a thick, clumpy discharge x 3 days. She denies bleeding or abdominal pain. She does endorse swelling of the labia and discomfort as well. She is sexually active with 1 partner and does not use condoms or birth control. They have been together for 1 year.   OB History    Gravida Para Term Preterm AB TAB SAB Ectopic Multiple Living   1    1  1    0      Past Medical History  Diagnosis Date  . Gonorrhea   . BV (bacterial vaginosis)   . Vaginal yeast infection   . Chlamydia     Past Surgical History  Procedure Laterality Date  . No past surgeries      Family History  Problem Relation Age of Onset  . Diabetes Maternal Uncle   . Cancer Maternal Grandmother     pancreatic    Social History  Substance Use Topics  . Smoking status: Never Smoker   . Smokeless tobacco: Never Used  . Alcohol Use: No    Allergies: No Known Allergies  Prescriptions prior to admission  Medication Sig Dispense Refill Last Dose  . metroNIDAZOLE (FLAGYL) 500 MG tablet Take 1 tablet (500 mg total) by mouth 2 (two) times daily. (Patient not taking: Reported on 11/14/2015) 14 tablet 0     Review of Systems  Constitutional: Negative for fever and malaise/fatigue.  Gastrointestinal: Negative for abdominal pain.  Genitourinary:       Neg - vaginal bleeding + vaginal discharge   Physical Exam   Blood pressure 123/89, pulse 106, temperature 98.5 F (36.9 C), temperature source Oral, resp. rate 18, height 5\' 10"  (1.778 m), weight 129 lb 12.8 oz (58.877 kg), last menstrual period 10/12/2015.  Physical Exam  Nursing note and vitals  reviewed. Constitutional: She is oriented to person, place, and time. She appears well-developed and well-nourished. No distress.  HENT:  Head: Normocephalic and atraumatic.  Cardiovascular: Normal rate.   Respiratory: Effort normal.  GI: Soft. She exhibits no distension and no mass. There is tenderness (mild lower abdominal tenderness to palpation). There is no rebound and no guarding.  Genitourinary: Uterus is tender (mild). Uterus is not enlarged. Cervix exhibits no motion tenderness, no discharge and no friability. Right adnexum displays no mass and no tenderness. Left adnexum displays no mass and no tenderness. No bleeding in the vagina. Vaginal discharge (copious amounts of thick, clumpy white-yellow discharge noted) found.  Neurological: She is alert and oriented to person, place, and time.  Skin: Skin is warm and dry. No erythema.  Psychiatric: She has a normal mood and affect.    Results for orders placed or performed during the hospital encounter of 11/14/15 (from the past 24 hour(s))  Urinalysis, Routine w reflex microscopic (not at [facility])     Status: Abnormal   Collection Time: 11/14/15  5:55 PM  Result Value Ref Range   Color, Urine YELLOW YELLOW   APPearance CLEAR CLEAR   Specific Gravity, Urine 1.020 1.005 - 1.030   pH 6.5 5.0 - 8.0   Glucose, UA NEGATIVE NEGATIVE mg/dL   Hgb  urine dipstick NEGATIVE NEGATIVE   Bilirubin Urine NEGATIVE NEGATIVE   Ketones, ur NEGATIVE NEGATIVE mg/dL   Protein, ur 30 (A) NEGATIVE mg/dL   Nitrite NEGATIVE NEGATIVE   Leukocytes, UA TRACE (A) NEGATIVE  Urine microscopic-add on     Status: Abnormal   Collection Time: 11/14/15  5:55 PM  Result Value Ref Range   Squamous Epithelial / LPF 6-30 (A) NONE SEEN   WBC, UA 0-5 0 - 5 WBC/hpf   RBC / HPF NONE SEEN 0 - 5 RBC/hpf   Bacteria, UA NONE SEEN NONE SEEN  Pregnancy, urine POC     Status: None   Collection Time: 11/14/15  6:01 PM  Result Value Ref Range   Preg Test, Ur NEGATIVE NEGATIVE  Wet  prep, genital     Status: Abnormal   Collection Time: 11/14/15  6:05 PM  Result Value Ref Range   Yeast Wet Prep HPF POC PRESENT (A) NONE SEEN   Trich, Wet Prep NONE SEEN NONE SEEN   Clue Cells Wet Prep HPF POC NONE SEEN NONE SEEN   WBC, Wet Prep HPF POC FEW (A) NONE SEEN   Sperm NONE SEEN     MAU Course  Procedures None  MDM UPT - negative UA, wet prep, GC/Chlamydia, HIV and RPR today  Assessment and Plan  A: Yeast vulvovaginitis  P: Discharge home Rx for Diflucan and Nystatin cream given to patient GC/Chlamydia, HIV and RPR pending Patient advised to follow-up with [facility] as needed if symptoms persist or worsen Patient may return to MAU as needed or if her condition were to change or worsen   Marny Lowenstein, PA-C  11/14/2015, 6:31 PM

## 2015-11-14 NOTE — MAU Note (Signed)
Vaginal swelling and green discharge, vaginal itching. LMP 1 st week of December unsure of exact date.

## 2015-11-14 NOTE — Discharge Instructions (Signed)

## 2015-11-15 LAB — HIV ANTIBODY (ROUTINE TESTING W REFLEX): HIV Screen 4th Generation wRfx: NONREACTIVE

## 2015-11-15 LAB — RPR: RPR: NONREACTIVE

## 2015-11-17 LAB — GC/CHLAMYDIA PROBE AMP (~~LOC~~) NOT AT ARMC
Chlamydia: NEGATIVE
NEISSERIA GONORRHEA: NEGATIVE

## 2016-02-14 ENCOUNTER — Encounter (HOSPITAL_COMMUNITY): Payer: Self-pay | Admitting: Oncology

## 2016-02-14 ENCOUNTER — Emergency Department (HOSPITAL_COMMUNITY)
Admission: EM | Admit: 2016-02-14 | Discharge: 2016-02-14 | Disposition: A | Discharge status: Home / Self Care | Payer: Self-pay | Attending: Emergency Medicine | Admitting: Emergency Medicine

## 2016-02-14 DIAGNOSIS — N760 Acute vaginitis: Secondary | ICD-10-CM | POA: Insufficient documentation

## 2016-02-14 DIAGNOSIS — B9689 Other specified bacterial agents as the cause of diseases classified elsewhere: Secondary | ICD-10-CM

## 2016-02-14 DIAGNOSIS — Z3202 Encounter for pregnancy test, result negative: Secondary | ICD-10-CM | POA: Insufficient documentation

## 2016-02-14 DIAGNOSIS — Z8619 Personal history of other infectious and parasitic diseases: Secondary | ICD-10-CM | POA: Insufficient documentation

## 2016-02-14 LAB — URINALYSIS, ROUTINE W REFLEX MICROSCOPIC
BILIRUBIN URINE: NEGATIVE
GLUCOSE, UA: NEGATIVE mg/dL
Hgb urine dipstick: NEGATIVE
KETONES UR: NEGATIVE mg/dL
Leukocytes, UA: NEGATIVE
NITRITE: NEGATIVE
PH: 5.5 (ref 5.0–8.0)
PROTEIN: 30 mg/dL — AB
Specific Gravity, Urine: 1.031 — ABNORMAL HIGH (ref 1.005–1.030)

## 2016-02-14 LAB — URINE MICROSCOPIC-ADD ON: RBC / HPF: NONE SEEN RBC/hpf (ref 0–5)

## 2016-02-14 LAB — WET PREP, GENITAL
Sperm: NONE SEEN
Trich, Wet Prep: NONE SEEN
Yeast Wet Prep HPF POC: NONE SEEN

## 2016-02-14 LAB — PREGNANCY, URINE: PREG TEST UR: NEGATIVE

## 2016-02-14 MED ORDER — METRONIDAZOLE 500 MG PO TABS: 500.0000 mg | ORAL_TABLET | Freq: Two times a day (BID) | ORAL | Status: DC

## 2016-02-14 MED ORDER — METRONIDAZOLE 500 MG PO TABS
500.0000 mg | ORAL_TABLET | Freq: Once | ORAL | Status: AC
  Administered 2016-02-14: 500 mg via ORAL
  Filled 2016-02-14: qty 1

## 2016-02-14 NOTE — Discharge Instructions (Signed)
You've been prescribed Flagyl take this as directed 1 tablet twice a day for 5 days .  Make an appointment.  [facility] for further evaluation and follow-up   Bacterial Vaginosis Bacterial vaginosis is a vaginal infection that occurs when the normal balance of bacteria in the vagina is disrupted. It results from an overgrowth of certain bacteria. This is the most common vaginal infection in women of childbearing age. Treatment is important to prevent complications, especially in pregnant women, as it can cause a premature delivery. CAUSES  Bacterial vaginosis is caused by an increase in harmful bacteria that are normally present in smaller amounts in the vagina. Several different kinds of bacteria can cause bacterial vaginosis. However, the reason that the condition develops is not fully understood. RISK FACTORS Certain activities or behaviors can put you at an increased risk of developing bacterial vaginosis, including:  Having a new sex partner or multiple sex partners.  Douching.  Using an intrauterine device (IUD) for contraception. Women do not get bacterial vaginosis from toilet seats, bedding, swimming pools, or contact with objects around them. SIGNS AND SYMPTOMS  Some women with bacterial vaginosis have no signs or symptoms. Common symptoms include:  Grey vaginal discharge.  A fishlike odor with discharge, especially after sexual intercourse.  Itching or burning of the vagina and vulva.  Burning or pain with urination. DIAGNOSIS  Your health care provider will take a medical history and examine the vagina for signs of bacterial vaginosis. A sample of vaginal fluid may be taken. Your health care provider will look at this sample under a microscope to check for bacteria and abnormal cells. A vaginal pH test may also be done.  TREATMENT  Bacterial vaginosis may be treated with antibiotic medicines. These may be given in the form of a pill or a vaginal cream.  A second round of antibiotics may be prescribed if the condition comes back after treatment. Because bacterial vaginosis increases your risk for sexually transmitted diseases, getting treated can help reduce your risk for chlamydia, gonorrhea, HIV, and herpes. HOME CARE INSTRUCTIONS   Only take over-the-counter or prescription medicines as directed by your health care provider.  If antibiotic medicine was prescribed, take it as directed. Make sure you finish it even if you start to feel better.  Tell all sexual partners that you have a vaginal infection. They should see their health care provider and be treated if they have problems, such as a mild rash or itching.  During treatment, it is important that you follow these instructions:  Avoid sexual activity or use condoms correctly.  Do not douche.  Avoid alcohol as directed by your health care provider.  Avoid breastfeeding as directed by your health care provider. SEEK MEDICAL CARE IF:   Your symptoms are not improving after 3 days of treatment.  You have increased discharge or pain.  You have a fever. MAKE SURE YOU:   Understand these instructions.  Will watch your condition.  Will get help right away if you are not doing well or get worse. FOR MORE INFORMATION  Centers for Disease Control and Prevention, Division of STD Prevention: SolutionApps.co.zawww.cdc.gov/std American Sexual Health Association (ASHA): www.ashastd.org    This information is not intended to replace advice given to you by your health care provider. Make sure you discuss any questions you have with your health care provider.   Document Released: 10/25/2005 Document Revised: 11/15/2014 Document Reviewed: 06/06/2013 Elsevier Interactive Patient Education [employer]2016 Elsevier Inc.

## 2016-02-14 NOTE — ED Notes (Signed)
Pt c/o vaginal discharge that is cloudy and malodorous as well as vaginal itching and sensitivity.  Reports using a 7 day course of Monistat w/o relief of sx.  Denies unprotected intercourse.  Denies abdominal pain.

## 2016-02-14 NOTE — ED Provider Notes (Signed)
CSN: [id number]     Arrival date & time 02/14/16  1950 History   First MD Initiated Contact with Patient 02/14/16 2036     Chief Complaint  Patient presents with  . Vaginal Discharge     (Consider location/radiation/quality/duration/timing/severity/associated sxs/prior Treatment) HPI Comments: This 20 year old female who states for the past several weeks.  She's had vaginal discharge, itching and odor.  She first notices with strong urine, parsley 2 weeks ago.  She's tried a week of over-the-counter Monistat for yeast infection.  This has not gotten any better.  She bought a second 7 day package of Monistat.  She is in day 2 of this without any relief. She states she's not had intercourse this year  Patient is a 20 y.o. female presenting with vaginal discharge. The history is provided by the patient.  Vaginal Discharge Quality:  Unable to specify Severity:  Moderate Onset quality:  Gradual Timing:  Constant Progression:  Worsening Chronicity:  New Context: spontaneously   Context: not after intercourse, not after urination, not at rest, not during bowel movement, not during pregnancy, not during urination, not genital trauma and not recent antibiotic use   Relieved by:  Nothing Worsened by:  Nothing tried Ineffective treatments:  OTC medications Associated symptoms: vaginal itching   Associated symptoms: no abdominal pain, no dysuria, no fever, no nausea, no rash, no urinary frequency and no urinary incontinence   Risk factors: no new sexual partner, no STI exposure and no unprotected sex     Past Medical History  Diagnosis Date  . Gonorrhea   . BV (bacterial vaginosis)   . Vaginal yeast infection   . Chlamydia    Past Surgical History  Procedure Laterality Date  . No past surgeries     Family History  Problem Relation Age of Onset  . Diabetes Maternal Uncle   . Cancer Maternal Grandmother     pancreatic   Social History  Substance Use Topics  . Smoking status: Never  Smoker   . Smokeless tobacco: Never Used  . Alcohol Use: No   OB History    Gravida Para Term Preterm AB TAB SAB Ectopic Multiple Living   1    1  1    0     Review of Systems  Constitutional: Negative for fever.  HENT: Negative.   Respiratory: Negative for shortness of breath.   Gastrointestinal: Negative.  Negative for nausea and abdominal pain.  Genitourinary: Positive for vaginal discharge. Negative for bladder incontinence, dysuria, frequency and vaginal pain.  Musculoskeletal: Negative.   Neurological: Negative.   Hematological: Negative.   Psychiatric/Behavioral: Negative.   All other systems reviewed and are negative.     Allergies  Review of patient's allergies indicates no known allergies.  Home Medications   Prior to Admission medications   Medication Sig Start Date End Date Taking? Authorizing Provider  fluconazole (DIFLUCAN) 150 MG tablet Take 1 tab (150 mg) today and 1 tab (150 mg) in 3 days Patient not taking: Reported on 02/14/2016 11/14/15   Marny LowensteinJulie N Wenzel, PA-C  metroNIDAZOLE (FLAGYL) 500 MG tablet Take 1 tablet (500 mg total) by mouth 2 (two) times daily. 02/14/16   Earley FavorGail Tara Rud, NP  nystatin cream (MYCOSTATIN) Apply to affected area 2 times daily Patient not taking: Reported on 02/14/2016 11/14/15   Marny LowensteinJulie N Wenzel, PA-C   BP 121/89 mmHg  Pulse 79  Temp(Src) 97.9 F (36.6 C) (Oral)  Resp 18  SpO2 100%  LMP 01/26/2016 (Exact Date) Physical Exam  Constitutional: She appears well-developed and well-nourished.  HENT:  Head: Normocephalic.  Eyes: Pupils are equal, round, and reactive to light.  Neck: Normal range of motion.  Cardiovascular: Normal rate and regular rhythm.   Pulmonary/Chest: Effort normal and breath sounds normal.  Abdominal: Soft. Bowel sounds are normal. She exhibits no distension. There is no tenderness.  Genitourinary: Cervix exhibits no discharge. Vaginal discharge found.  Unable to determine discharge using Metrogel  Nursing note and  vitals reviewed.   ED Course  Procedures (including critical care time) Labs Review Labs Reviewed  WET PREP, GENITAL - Abnormal; Notable for the following:    Clue Cells Wet Prep HPF POC PRESENT (*)    WBC, Wet Prep HPF POC RARE (*)    All other components within normal limits  URINALYSIS, ROUTINE W REFLEX MICROSCOPIC (NOT AT [facility]) - Abnormal; Notable for the following:    Color, Urine AMBER (*)    APPearance CLOUDY (*)    Specific Gravity, Urine 1.031 (*)    Protein, ur 30 (*)    All other components within normal limits  URINE MICROSCOPIC-ADD ON - Abnormal; Notable for the following:    Squamous Epithelial / LPF 6-30 (*)    Bacteria, UA FEW (*)    All other components within normal limits  PREGNANCY, URINE  GC/CHLAMYDIA PROBE AMP ([place]) NOT AT [facility]    Imaging Review No results found. I have personally reviewed and evaluated these images and lab results as part of my medical decision-making.   EKG Interpretation None      MDM   Final diagnoses:  BV (bacterial vaginosis)         Earley Favor, NP 02/14/16 2230  Lorre Nick, MD 02/18/16 2336

## 2016-02-16 LAB — GC/CHLAMYDIA PROBE AMP (~~LOC~~) NOT AT ARMC
Chlamydia: NEGATIVE
Neisseria Gonorrhea: NEGATIVE

## 2016-02-19 ENCOUNTER — Encounter (HOSPITAL_COMMUNITY): Payer: Self-pay | Admitting: *Deleted

## 2016-02-19 ENCOUNTER — Emergency Department (HOSPITAL_COMMUNITY)
Admission: EM | Admit: 2016-02-19 | Discharge: 2016-02-19 | Disposition: A | Discharge status: Home / Self Care | Payer: Self-pay | Attending: Emergency Medicine | Admitting: Emergency Medicine

## 2016-02-19 ENCOUNTER — Emergency Department (HOSPITAL_COMMUNITY): Payer: Self-pay

## 2016-02-19 DIAGNOSIS — S0990XA Unspecified injury of head, initial encounter: Secondary | ICD-10-CM | POA: Insufficient documentation

## 2016-02-19 DIAGNOSIS — Y9389 Activity, other specified: Secondary | ICD-10-CM | POA: Insufficient documentation

## 2016-02-19 DIAGNOSIS — Y998 Other external cause status: Secondary | ICD-10-CM | POA: Insufficient documentation

## 2016-02-19 DIAGNOSIS — Y9289 Other specified places as the place of occurrence of the external cause: Secondary | ICD-10-CM | POA: Insufficient documentation

## 2016-02-19 MED ORDER — OXYCODONE-ACETAMINOPHEN 5-325 MG PO TABS
1.0000 | ORAL_TABLET | ORAL | Status: DC | PRN
  Administered 2016-02-19: 1 via ORAL
  Filled 2016-02-19: qty 1

## 2016-02-19 NOTE — ED Notes (Signed)
Pain medication given in Triage. Patient advised about side effects of medications and  to avoid driving for a minimum of 4 hours.  

## 2016-02-19 NOTE — ED Notes (Signed)
Pt called for CT, no answer from the lobby
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
## 2016-02-28 ENCOUNTER — Inpatient Hospital Stay (HOSPITAL_COMMUNITY)
Admission: AD | Admit: 2016-02-28 | Discharge: 2016-02-28 | Disposition: A | Discharge status: Home / Self Care | Payer: Self-pay | Source: Ambulatory Visit | Attending: Obstetrics & Gynecology | Admitting: Obstetrics & Gynecology

## 2016-02-28 ENCOUNTER — Encounter (HOSPITAL_COMMUNITY): Payer: Self-pay

## 2016-02-28 DIAGNOSIS — B9689 Other specified bacterial agents as the cause of diseases classified elsewhere: Secondary | ICD-10-CM

## 2016-02-28 DIAGNOSIS — A499 Bacterial infection, unspecified: Secondary | ICD-10-CM

## 2016-02-28 DIAGNOSIS — N760 Acute vaginitis: Secondary | ICD-10-CM

## 2016-02-28 LAB — URINALYSIS, ROUTINE W REFLEX MICROSCOPIC
Bilirubin Urine: NEGATIVE
GLUCOSE, UA: NEGATIVE mg/dL
HGB URINE DIPSTICK: NEGATIVE
KETONES UR: NEGATIVE mg/dL
LEUKOCYTES UA: NEGATIVE
Nitrite: NEGATIVE
PH: 6 (ref 5.0–8.0)
PROTEIN: NEGATIVE mg/dL
Specific Gravity, Urine: <1.005 — ABNORMAL LOW (ref 1.005–1.030)

## 2016-02-28 LAB — WET PREP, GENITAL
CLUE CELLS WET PREP: NONE SEEN
Sperm: NONE SEEN
Trich, Wet Prep: NONE SEEN
WBC WET PREP: NONE SEEN
Yeast Wet Prep HPF POC: NONE SEEN

## 2016-02-28 LAB — POCT PREGNANCY, URINE: PREG TEST UR: NEGATIVE

## 2016-02-28 MED ORDER — METRONIDAZOLE 500 MG PO TABS: 500.0000 mg | ORAL_TABLET | Freq: Two times a day (BID) | ORAL | Status: DC

## 2016-02-28 NOTE — MAU Provider Note (Signed)
History     CSN: [id number]  Arrival date and time: 02/28/16 1143   First Provider Initiated Contact with Patient 02/28/16 1248      Chief Complaint  Patient presents with  . Vaginal Discharge  . Vaginal Itching   HPI   Ms.Terry [name] is a 20 y.o. female G1P0010 non pregnant female presenting to MAU with concerns about recurring, untreated BV.  She was treated 3 weeks ago for BV; however was only given 10 tablets and took them all. She looked up the dosing of flagyl and realizes she should of have more than 10 doses. She had her  cycle on 4/14 and following her cycle she noticed a fishy odor, and vaginitis.  She has not tried anything over the counter for the symptoms. No new sexual partners.   OB History    Gravida Para Term Preterm AB TAB SAB Ectopic Multiple Living   0      Past Medical History  Diagnosis Date  . Gonorrhea   . BV (bacterial vaginosis)   . Vaginal yeast infection   . Chlamydia     Past Surgical History  Procedure Laterality Date  . No past surgeries      Family History  Problem Relation Age of Onset  . Diabetes Maternal Uncle   . Cancer Maternal Grandmother     pancreatic    Social History  Substance Use Topics  . Smoking status: Never Smoker   . Smokeless tobacco: Never Used  . Alcohol Use: No    Allergies: No Known Allergies  Prescriptions prior to admission  Medication Sig Dispense Refill Last Dose  . fluconazole (DIFLUCAN) 150 MG tablet Take 1 tab (150 mg) today and 1 tab (150 mg) in 3 days (Patient not taking: Reported on 02/14/2016) 2 tablet 0 Completed Course at Unknown time  . metroNIDAZOLE (FLAGYL) 500 MG tablet Take 1 tablet (500 mg total) by mouth 2 (two) times daily. (Patient not taking: Reported on 02/28/2016) 9 tablet 0   . nystatin cream (MYCOSTATIN) Apply to affected area 2 times daily (Patient not taking: Reported on 02/14/2016) 15 g 0 Completed Course at Unknown time   Results for orders placed or performed  during the hospital encounter of 02/28/16 (from the past 48 hour(s))  Urinalysis, Routine w reflex microscopic (not at [facility])     Status: Abnormal   Collection Time: 02/28/16 11:49 AM  Result Value Ref Range   Color, Urine YELLOW YELLOW   APPearance CLEAR CLEAR   Specific Gravity, Urine <1.005 (L) 1.005 - 1.030   pH 6.0 5.0 - 8.0   Glucose, UA NEGATIVE NEGATIVE mg/dL   Hgb urine dipstick NEGATIVE NEGATIVE   Bilirubin Urine NEGATIVE NEGATIVE   Ketones, ur NEGATIVE NEGATIVE mg/dL   Protein, ur NEGATIVE NEGATIVE mg/dL   Nitrite NEGATIVE NEGATIVE   Leukocytes, UA NEGATIVE NEGATIVE    Comment: MICROSCOPIC NOT DONE ON URINES WITH NEGATIVE PROTEIN, BLOOD, LEUKOCYTES, NITRITE, OR GLUCOSE <1000 mg/dL.  Wet prep, genital     Status: None   Collection Time: 02/28/16 12:00 PM  Result Value Ref Range   Yeast Wet Prep HPF POC NONE SEEN NONE SEEN   Trich, Wet Prep NONE SEEN NONE SEEN   Clue Cells Wet Prep HPF POC NONE SEEN NONE SEEN   WBC, Wet Prep HPF POC NONE SEEN NONE SEEN    Comment: FEW BACTERIA SEEN   Sperm NONE SEEN   Pregnancy, urine POC  Status: None   Collection Time: 02/28/16 12:03 PM  Result Value Ref Range   Preg Test, Ur NEGATIVE NEGATIVE    Comment:        THE SENSITIVITY OF THIS METHODOLOGY IS >24 mIU/mL     Review of Systems  Constitutional: Negative for fever and chills.  Gastrointestinal: Negative for abdominal pain.  Genitourinary: Negative for dysuria and urgency.   Physical Exam   Blood pressure 113/76, pulse 67, temperature 98.1 F (36.7 C), temperature source Oral, resp. rate 18, last menstrual period 02/20/2016.  Physical Exam  Constitutional: She is oriented to person, place, and time. She appears well-developed and well-nourished. No distress.  HENT:  Head: Normocephalic.  Eyes: Pupils are equal, round, and reactive to light.  Neck: Neck supple.  Respiratory: Effort normal.  Genitourinary:  Wet prep and GC collected by RN  Neurological: She is alert  and oriented to person, place, and time.  Skin: Skin is warm. She is not diaphoretic.  Psychiatric: Her behavior is normal.    MAU Course  Procedures  None  MDM   Wet prep and GC collected by RN.   Assessment and Plan   A:  1. BV (bacterial vaginosis)   2. Vaginitis and vulvovaginitis      P:  Discharge home in stable condition Complete full course of Flagyl RX: 500 mg BID X 7 days no alcohol If vaginitis symptoms continue after flagyl pick up monistat 7 day Patient desires to be seen in the [facility]; she has no insurance and desires GYN care. I included the [facility] contact info on her discharge paper work and encouraged her to call to schedule as needed.  [facility] Probiotic daily  Duane LopeJennifer I Rasch, NP 02/28/2016 2:04 PM

## 2016-02-28 NOTE — MAU Note (Signed)
Patient was treated with BV, having fishy smell white discharge with itching.

## 2016-02-28 NOTE — Discharge Instructions (Signed)

## 2016-03-01 LAB — GC/CHLAMYDIA PROBE AMP (~~LOC~~) NOT AT ARMC
Chlamydia: NEGATIVE
NEISSERIA GONORRHEA: NEGATIVE

## 2016-03-29 ENCOUNTER — Encounter: Payer: Self-pay | Admitting: Obstetrics & Gynecology

## 2016-03-29 NOTE — Progress Notes (Signed)
Pt no showed for her appt.  Per Dr. Debroah LoopArnold, pt does not need to be contacted.

## 2016-04-10 ENCOUNTER — Encounter (HOSPITAL_COMMUNITY): Payer: Self-pay | Admitting: Emergency Medicine

## 2016-04-10 ENCOUNTER — Emergency Department (HOSPITAL_COMMUNITY)
Admission: EM | Admit: 2016-04-10 | Discharge: 2016-04-10 | Disposition: A | Discharge status: Home / Self Care | Payer: Self-pay | Attending: Emergency Medicine | Admitting: Emergency Medicine

## 2016-04-10 ENCOUNTER — Emergency Department (HOSPITAL_COMMUNITY): Payer: Self-pay

## 2016-04-10 DIAGNOSIS — Z792 Long term (current) use of antibiotics: Secondary | ICD-10-CM | POA: Insufficient documentation

## 2016-04-10 DIAGNOSIS — S4991XA Unspecified injury of right shoulder and upper arm, initial encounter: Secondary | ICD-10-CM | POA: Insufficient documentation

## 2016-04-10 DIAGNOSIS — S20419A Abrasion of unspecified back wall of thorax, initial encounter: Secondary | ICD-10-CM | POA: Insufficient documentation

## 2016-04-10 DIAGNOSIS — Y939 Activity, unspecified: Secondary | ICD-10-CM | POA: Insufficient documentation

## 2016-04-10 DIAGNOSIS — Y9289 Other specified places as the place of occurrence of the external cause: Secondary | ICD-10-CM | POA: Insufficient documentation

## 2016-04-10 DIAGNOSIS — Y999 Unspecified external cause status: Secondary | ICD-10-CM | POA: Insufficient documentation

## 2016-04-10 DIAGNOSIS — R0602 Shortness of breath: Secondary | ICD-10-CM | POA: Insufficient documentation

## 2016-04-10 MED ORDER — KETOROLAC TROMETHAMINE 60 MG/2ML IM SOLN
60.0000 mg | Freq: Once | INTRAMUSCULAR | Status: AC
  Administered 2016-04-10: 60 mg via INTRAMUSCULAR
  Filled 2016-04-10: qty 2

## 2016-04-10 MED ORDER — MELOXICAM 7.5 MG PO TABS: 7.5000 mg | ORAL_TABLET | Freq: Every day | ORAL | Status: DC

## 2016-04-10 MED ORDER — METHOCARBAMOL 500 MG PO TABS: 500.0000 mg | ORAL_TABLET | Freq: Four times a day (QID) | ORAL | Status: DC | PRN

## 2016-04-10 NOTE — Discharge Instructions (Signed)
°  Read the information below.  Use the prescribed medication as directed.  Please discuss all new medications with your pharmacist.  You may return to the Emergency Department at any time for worsening condition or any new symptoms that concern you.    If you develop uncontrolled pain, weakness or numbness of the extremity, severe discoloration of the skin, or you are unable to move your arm, return to the ER for a recheck.    °
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
## 2016-04-10 NOTE — ED Notes (Signed)
Pt in radiology 

## 2016-05-03 ENCOUNTER — Encounter (HOSPITAL_COMMUNITY): Payer: Self-pay | Admitting: Emergency Medicine

## 2016-05-03 DIAGNOSIS — O26891 Other specified pregnancy related conditions, first trimester: Secondary | ICD-10-CM | POA: Insufficient documentation

## 2016-05-03 DIAGNOSIS — R102 Pelvic and perineal pain: Secondary | ICD-10-CM | POA: Insufficient documentation

## 2016-05-03 DIAGNOSIS — Z3A01 Less than 8 weeks gestation of pregnancy: Secondary | ICD-10-CM | POA: Insufficient documentation

## 2016-05-03 NOTE — ED Notes (Signed)
Pt states that she took 3 pregnancy test today after having pelvic pain and they all came back positive. Last period 5/3. Alert and oriented.

## 2016-05-04 ENCOUNTER — Emergency Department (HOSPITAL_COMMUNITY): Payer: Self-pay

## 2016-05-04 ENCOUNTER — Emergency Department (HOSPITAL_COMMUNITY)
Admission: EM | Admit: 2016-05-04 | Discharge: 2016-05-04 | Disposition: A | Discharge status: Home / Self Care | Payer: Self-pay | Attending: Emergency Medicine | Admitting: Emergency Medicine

## 2016-05-04 DIAGNOSIS — R1030 Lower abdominal pain, unspecified: Secondary | ICD-10-CM

## 2016-05-04 DIAGNOSIS — Z3490 Encounter for supervision of normal pregnancy, unspecified, unspecified trimester: Secondary | ICD-10-CM

## 2016-05-04 LAB — CBC
HEMATOCRIT: 35.6 % — AB (ref 36.0–46.0)
Hemoglobin: 12.5 g/dL (ref 12.0–15.0)
MCH: 32.6 pg (ref 26.0–34.0)
MCHC: 35.1 g/dL (ref 30.0–36.0)
MCV: 93 fL (ref 78.0–100.0)
PLATELETS: 250 10*3/uL (ref 150–400)
RBC: 3.83 MIL/uL — ABNORMAL LOW (ref 3.87–5.11)
RDW: 12.1 % (ref 11.5–15.5)
WBC: 9.7 10*3/uL (ref 4.0–10.5)

## 2016-05-04 LAB — URINALYSIS, ROUTINE W REFLEX MICROSCOPIC
BILIRUBIN URINE: NEGATIVE
GLUCOSE, UA: NEGATIVE mg/dL
HGB URINE DIPSTICK: NEGATIVE
Ketones, ur: NEGATIVE mg/dL
Leukocytes, UA: NEGATIVE
Nitrite: NEGATIVE
PH: 7 (ref 5.0–8.0)
Protein, ur: NEGATIVE mg/dL
SPECIFIC GRAVITY, URINE: 1.029 (ref 1.005–1.030)

## 2016-05-04 LAB — BASIC METABOLIC PANEL
ANION GAP: 6 (ref 5–15)
BUN: 14 mg/dL (ref 6–20)
CALCIUM: 9 mg/dL (ref 8.9–10.3)
CO2: 25 mmol/L (ref 22–32)
Chloride: 104 mmol/L (ref 101–111)
Creatinine, Ser: 0.81 mg/dL (ref 0.44–1.00)
Glucose, Bld: 95 mg/dL (ref 65–99)
Potassium: 3.3 mmol/L — ABNORMAL LOW (ref 3.5–5.1)
Sodium: 135 mmol/L (ref 135–145)

## 2016-05-04 LAB — HCG, QUANTITATIVE, PREGNANCY: hCG, Beta Chain, Quant, S: 16577 m[IU]/mL — ABNORMAL HIGH (ref <5)

## 2016-05-04 MED ORDER — ACETAMINOPHEN 500 MG PO TABS: 500.0000 mg | ORAL_TABLET | Freq: Four times a day (QID) | ORAL | Status: DC | PRN

## 2016-05-04 NOTE — Discharge Instructions (Signed)

## 2016-05-04 NOTE — ED Provider Notes (Signed)
CSN: [id number]     Arrival date & time 05/03/16  2149 History   First MD Initiated Contact with Patient 05/04/16 0004     Chief Complaint  Patient presents with  . Pelvic Pain     (Consider location/radiation/quality/duration/timing/severity/associated sxs/prior Treatment) HPI Comments: 20 year old female with a history of gonorrhea, BV, and chlamydia presents to the emergency department for evaluation of lower abdominal cramping. She states that she has had intermittent cramping over the past week. She denies taking any medications for symptoms and has had no associated fever, vomiting, dysuria, hematuria, or vaginal discharge. She reports that her last menstrual period was 03/10/2016. She states that she took 3 pregnancy tests prior to arrival which were positive. She has never been pregnant previously. She has been engaging in unprotected sexual intercourse with her boyfriend of 1.5 years. She is not currently on birth control.  Patient is a 20 y.o. female presenting with pelvic pain. The history is provided by the patient. No language interpreter was used.  Pelvic Pain Associated symptoms include nausea.    Past Medical History  Diagnosis Date  . Gonorrhea   . BV (bacterial vaginosis)   . Vaginal yeast infection   . Chlamydia    Past Surgical History  Procedure Laterality Date  . No past surgeries     Family History  Problem Relation Age of Onset  . Diabetes Maternal Uncle   . Cancer Maternal Grandmother     pancreatic   Social History  Substance Use Topics  . Smoking status: Never Smoker   . Smokeless tobacco: Never Used  . Alcohol Use: No   OB History    Gravida Para Term Preterm AB TAB SAB Ectopic Multiple Living   2    1  1    0      Review of Systems  Gastrointestinal: Positive for nausea.  Genitourinary: Positive for pelvic pain.  All other systems reviewed and are negative.   Allergies  Review of patient's allergies indicates no known allergies.  Home  Medications   Prior to Admission medications   Medication Sig Start Date End Date Taking? Authorizing Provider  meloxicam (MOBIC) 7.5 MG tablet Take 1 tablet (7.5 mg total) by mouth daily. 04/10/16  Yes Trixie DredgeEmily West, PA-C  methocarbamol (ROBAXIN) 500 MG tablet Take 1-2 tablets (500-1,000 mg total) by mouth every 6 (six) hours as needed for muscle spasms (and pain). 04/10/16  Yes Trixie DredgeEmily West, PA-C  acetaminophen (TYLENOL) 500 MG tablet Take 1 tablet (500 mg total) by mouth every 6 (six) hours as needed for mild pain or moderate pain. 05/04/16   Antony MaduraKelly Audry Kauzlarich, PA-C  metroNIDAZOLE (FLAGYL) 500 MG tablet Take 1 tablet (500 mg total) by mouth 2 (two) times daily. Patient not taking: Reported on 05/04/2016 02/28/16   Duane LopeJennifer I Rasch, NP   BP 123/81 mmHg  Pulse 80  Temp(Src) 98.8 F (37.1 C) (Oral)  Resp 18  SpO2 100%  LMP 03/10/2016 (Exact Date)   Physical Exam  Constitutional: She is oriented to person, place, and time. She appears well-developed and well-nourished. No distress.  Nontoxic appearing  HENT:  Head: Normocephalic and atraumatic.  Eyes: Conjunctivae and EOM are normal. No scleral icterus.  Neck: Normal range of motion.  Cardiovascular: Normal rate, regular rhythm and intact distal pulses.   Pulmonary/Chest: Effort normal. No respiratory distress. She has no wheezes. She has no rales.  Respirations even and unlabored  Abdominal: Soft. She exhibits no distension. There is no tenderness. There is no rebound  and no guarding.  Soft, nontender abdomen. No masses. No rigidity.  Musculoskeletal: Normal range of motion.  Neurological: She is alert and oriented to person, place, and time. She exhibits normal muscle tone. Coordination normal.  Skin: Skin is warm and dry. No rash noted. She is not diaphoretic. No erythema. No pallor.  Psychiatric: She has a normal mood and affect. Her behavior is normal.  Nursing note and vitals reviewed.   ED Course  Procedures (including critical care  time) Labs Review Labs Reviewed  HCG, QUANTITATIVE, PREGNANCY - Abnormal; Notable for the following:    hCG, Beta Chain, Quant, S 16577 (*)    All other components within normal limits  URINALYSIS, ROUTINE W REFLEX MICROSCOPIC (NOT AT [facility]) - Abnormal; Notable for the following:    APPearance CLOUDY (*)    All other components within normal limits  CBC - Abnormal; Notable for the following:    RBC 3.83 (*)    HCT 35.6 (*)    All other components within normal limits  BASIC METABOLIC PANEL - Abnormal; Notable for the following:    Potassium 3.3 (*)    All other components within normal limits    Imaging Review [place] Ob Comp Less 14 Wks  05/04/2016  CLINICAL DATA:  20 year old female with positive HCG levels presenting with pelvic pain. EXAM: OBSTETRIC <14 WK US AND TRANSVAGINAL OB US TECHNIQUE: Both transabdominal and transvaginal ultrasound examinations were performed for complete evaluation of the gestation as well as the maternal uterus, adnexal regions, and pelvic cul-de-sac. Transvaginal technique was performed to assess early pregnancy. COMPARISON:  None. FINDINGS: Intrauterine gestational sac: Single intrauterine gestational sac Yolk sac:  Seen Embryo:  Present Cardiac Activity: Detected Heart Rate: 125  bpm CRL:  5  mm   6 w   1 d                  US EDC: 12/27/2016 Subchorionic hemorrhage:  None visualized. Maternal uterus/adnexae: The maternal ovaries appear unremarkable. The right ovary measures 3.7 x 3.0 x 2.8 cm and the left ovary measures 2.7 x 1.5 x 1.7 cm. IMPRESSION: Single live intrauterine pregnancy with an estimated gestational age of [redacted] weeks, 1 day. Electronically Signed   By: Elgie CollardArash  Radparvar M.D.   On: 05/04/2016 03:34   [place] Ob Transvaginal  05/04/2016  CLINICAL DATA:  20 year old female with positive HCG levels presenting with pelvic pain. EXAM: OBSTETRIC <14 WK US AND TRANSVAGINAL OB US TECHNIQUE: Both transabdominal and transvaginal ultrasound examinations were performed  for complete evaluation of the gestation as well as the maternal uterus, adnexal regions, and pelvic cul-de-sac. Transvaginal technique was performed to assess early pregnancy. COMPARISON:  None. FINDINGS: Intrauterine gestational sac: Single intrauterine gestational sac Yolk sac:  Seen Embryo:  Present Cardiac Activity: Detected Heart Rate: 125  bpm CRL:  5  mm   6 w   1 d                  US EDC: 12/27/2016 Subchorionic hemorrhage:  None visualized. Maternal uterus/adnexae: The maternal ovaries appear unremarkable. The right ovary measures 3.7 x 3.0 x 2.8 cm and the left ovary measures 2.7 x 1.5 x 1.7 cm. IMPRESSION: Single live intrauterine pregnancy with an estimated gestational age of [redacted] weeks, 1 day. Electronically Signed   By: Elgie CollardArash  Radparvar M.D.   On: 05/04/2016 03:34     I have personally reviewed and evaluated these images and lab results as part of my medical decision-making.  EKG Interpretation None      MDM   Final diagnoses:  Intrauterine pregnancy    20 year old female presents to the emergency department for evaluation of abdominal pain in pregnancy. Patient found to have a single live intrauterine pregnancy of approximately 6 weeks 1 day. This correlates with her hCG as well as her LMP. Patient has no leukocytosis. No evidence of UTI. She declines a pelvic exam today, stating that she had STD tests 3 weeks ago which were negative and she has not been sexually active since being tested for STDs. She has been advised to follow-up with an OB/GYN on an outpatient basis for prenatal care. Return precautions discussed and provided. Patient discharged in satisfactory condition with no unaddressed concerns.   Filed Vitals:   05/03/16 2201 05/04/16 0029 05/04/16 0228  BP: 122/72 124/77 123/81  Pulse: 100 78 80  Temp: 98.8 F (37.1 C)    TempSrc: Oral    Resp: SpO2: 96% 99% 100%     Antony Madura, PA-C 05/04/16 0348  Tomasita Crumble, MD 05/04/16 [phone]

## 2016-05-19 ENCOUNTER — Encounter: Payer: Self-pay | Admitting: Certified Nurse Midwife

## 2016-05-19 ENCOUNTER — Ambulatory Visit (INDEPENDENT_AMBULATORY_CARE_PROVIDER_SITE_OTHER): Payer: Medicaid Other | Admitting: Certified Nurse Midwife

## 2016-05-19 VITALS — BP 125/89 | HR 111 | Temp 98.3°F | Wt 123.0 lb

## 2016-05-19 DIAGNOSIS — Z331 Pregnant state, incidental: Secondary | ICD-10-CM | POA: Diagnosis not present

## 2016-05-19 DIAGNOSIS — Z3400 Encounter for supervision of normal first pregnancy, unspecified trimester: Secondary | ICD-10-CM | POA: Insufficient documentation

## 2016-05-19 DIAGNOSIS — Z3401 Encounter for supervision of normal first pregnancy, first trimester: Secondary | ICD-10-CM | POA: Diagnosis not present

## 2016-05-19 DIAGNOSIS — Z1389 Encounter for screening for other disorder: Secondary | ICD-10-CM | POA: Diagnosis not present

## 2016-05-19 LAB — POCT URINALYSIS DIPSTICK
BILIRUBIN UA: NEGATIVE
Blood, UA: NEGATIVE
GLUCOSE UA: NEGATIVE
KETONES UA: NEGATIVE
LEUKOCYTES UA: NEGATIVE
Nitrite, UA: NEGATIVE
Spec Grav, UA: <=1.005
Urobilinogen, UA: NEGATIVE
pH, UA: 6

## 2016-05-19 MED ORDER — PRENATE PIXIE 10-0.6-0.4-200 MG PO CAPS: 1.0000 | ORAL_CAPSULE | Freq: Every day | ORAL | Status: DC

## 2016-05-19 NOTE — Addendum Note (Signed)
Addended by: Marya LandryFOSTER, Asyia Hornung D on: 05/19/2016 04:41 PM   Modules accepted: Orders

## 2016-05-19 NOTE — Progress Notes (Signed)
Subjective:    Terry [name] is being seen today for her first obstetrical visit.  This is not a planned pregnancy. She is at 629w2d gestation. Her obstetrical history is significant for none. Relationship with FOB: not involved. Patient undecided intend to breast feed. Pregnancy history fully reviewed.  The information documented in the HPI was reviewed and verified.  Menstrual History: OB History    Gravida Para Term Preterm AB TAB SAB Ectopic Multiple Living   1    0  0   0      Menarche age: middle school.   Patient's last menstrual period was 03/10/2016 (exact date).    Past Medical History  Diagnosis Date  . Gonorrhea   . BV (bacterial vaginosis)   . Vaginal yeast infection   . Chlamydia     Past Surgical History  Procedure Laterality Date  . No past surgeries       (Not in a hospital admission) No Known Allergies  Social History  Substance Use Topics  . Smoking status: Never Smoker   . Smokeless tobacco: Never Used  . Alcohol Use: No    Family History  Problem Relation Age of Onset  . Diabetes Maternal Uncle   . Cancer Maternal Grandmother     pancreatic  . Diabetes Mother   . Hypertension Mother      Review of Systems Constitutional: negative for weight loss Gastrointestinal: negative for vomiting Genitourinary:negative for genital lesions and vaginal discharge and dysuria Musculoskeletal:negative for back pain Behavioral/Psych: negative for abusive relationship, depression, illegal drug usage and tobacco use    Objective:    BP 125/89 mmHg  Pulse 111  Temp(Src) 98.3 F (36.8 C)  Wt 123 lb (55.792 kg)  LMP 03/10/2016 (Exact Date) General Appearance:    Alert, cooperative, no distress, appears stated age  Head:    Normocephalic, without obvious abnormality, atraumatic  Eyes:    PERRL, conjunctiva/corneas clear, EOM's intact, fundi    benign, both eyes  Ears:    Normal TM's and external ear canals, both ears  Nose:   Nares normal, septum  midline, mucosa normal, no drainage    or sinus tenderness  Throat:   Lips, mucosa, and tongue normal; teeth and gums normal  Neck:   Supple, symmetrical, trachea midline, no adenopathy;    thyroid:  no enlargement/tenderness/nodules; no carotid   bruit or JVD  Back:     Symmetric, no curvature, ROM normal, no CVA tenderness  Lungs:     Clear to auscultation bilaterally, respirations unlabored  Chest Wall:    No tenderness or deformity   Heart:    Regular rate and rhythm, S1 and S2 normal, no murmur, rub   or gallop  Breast Exam:    No tenderness, masses, or nipple abnormality  Abdomen:     Soft, non-tender, bowel sounds active all four quadrants,    no masses, no organomegaly  Genitalia:    Normal female without lesion, discharge or tenderness  Extremities:   Extremities normal, atraumatic, no cyanosis or edema  Pulses:   2+ and symmetric all extremities  Skin:   Skin color, texture, turgor normal, no rashes or lesions  Lymph nodes:   Cervical, supraclavicular, and axillary nodes normal  Neurologic:   CNII-XII intact, normal strength, sensation and reflexes    throughout           Cervix:  Long, thick, closed and posterior.    Lab Review Urine pregnancy test Labs reviewed yes Radiologic studies reviewed  yes Assessment:    Pregnancy at [redacted]w[redacted]d weeks    Plan:      Prenatal vitamins.  Counseling provided regarding continued use of seat belts, cessation of alcohol consumption, smoking or use of illicit drugs; infection precautions i.e., influenza/TDAP immunizations, toxoplasmosis,CMV, parvovirus, listeria and varicella; workplace safety, exercise during pregnancy; routine dental care, safe medications, sexual activity, hot tubs, saunas, pools, travel, caffeine use, fish and methlymercury, potential toxins, hair treatments, varicose veins Weight gain recommendations per IOM guidelines reviewed: underweight/BMI< 18.5--> gain 28 - 40 lbs; normal weight/BMI 18.5 - 24.9--> gain 25 - 35 lbs;  overweight/BMI 25 - 29.9--> gain 15 - 25 lbs; obese/BMI >30->gain  11 - 20 lbs Problem list reviewed and updated. FIRST/CF mutation testing/NIPT/QUAD SCREEN/fragile X/Ashkenazi Jewish population testing/Spinal muscular atrophy discussed: requested. Role of ultrasound in pregnancy discussed; fetal survey: requested. Amniocentesis discussed: not indicated. VBAC calculator score: VBAC consent form provided No orders of the defined types were placed in this encounter.   Orders Placed This Encounter  Procedures  . POCT urinalysis dipstick    Follow up in 4 weeks. 50% of 30 min visit spent on counseling and coordination of care.

## 2016-05-19 NOTE — Progress Notes (Signed)
Patient is doing well today.  

## 2016-05-21 LAB — PRENATAL PROFILE I(LABCORP)
Antibody Screen: NEGATIVE
BASOS ABS: 0 10*3/uL (ref 0.0–0.2)
Basos: 0 %
EOS (ABSOLUTE): 0 10*3/uL (ref 0.0–0.4)
Eos: 0 %
HEMOGLOBIN: 14.5 g/dL (ref 11.1–15.9)
HEP B S AG: NEGATIVE
Hematocrit: 43.9 % (ref 34.0–46.6)
Immature Grans (Abs): 0 10*3/uL (ref 0.0–0.1)
Immature Granulocytes: 0 %
LYMPHS: 15 %
Lymphocytes Absolute: 1.9 10*3/uL (ref 0.7–3.1)
MCH: 32.4 pg (ref 26.6–33.0)
MCHC: 33 g/dL (ref 31.5–35.7)
MCV: 98 fL — ABNORMAL HIGH (ref 79–97)
MONOCYTES: 4 %
Monocytes Absolute: 0.6 10*3/uL (ref 0.1–0.9)
NEUTROS ABS: 10.7 10*3/uL — AB (ref 1.4–7.0)
Neutrophils: 81 %
PLATELETS: 346 10*3/uL (ref 150–379)
RBC: 4.48 x10E6/uL (ref 3.77–5.28)
RDW: 12.8 % (ref 12.3–15.4)
RPR Ser Ql: NONREACTIVE
Rh Factor: POSITIVE
Rubella Antibodies, IGG: 18 index (ref >0.99)
WBC: 13.3 10*3/uL — AB (ref 3.4–10.8)

## 2016-05-21 LAB — TSH: TSH: 0.983 u[IU]/mL (ref 0.450–4.500)

## 2016-05-21 LAB — HEMOGLOBINOPATHY EVALUATION
HGB A: 96.3 % (ref 94.0–98.0)
HGB C: 0 %
HGB S: 0 %
Hemoglobin A2 Quantitation: 2.6 % (ref 0.7–3.1)
Hemoglobin F Quantitation: 1.1 % (ref 0.0–2.0)

## 2016-05-21 LAB — HIV ANTIBODY (ROUTINE TESTING W REFLEX): HIV SCREEN 4TH GENERATION: NONREACTIVE

## 2016-05-21 LAB — PAP IG W/ RFLX HPV ASCU: PAP SMEAR COMMENT: 0

## 2016-05-21 LAB — CULTURE, OB URINE

## 2016-05-21 LAB — URINE CULTURE, OB REFLEX

## 2016-05-21 LAB — VARICELLA ZOSTER ANTIBODY, IGG: Varicella zoster IgG: >4000 index (ref >165)

## 2016-05-22 LAB — NUSWAB VG+, CANDIDA 6SP
CANDIDA ALBICANS, NAA: NEGATIVE
CANDIDA GLABRATA, NAA: NEGATIVE
CHLAMYDIA TRACHOMATIS, NAA: NEGATIVE
Candida krusei, NAA: NEGATIVE
Candida lusitaniae, NAA: NEGATIVE
Candida parapsilosis, NAA: NEGATIVE
Candida tropicalis, NAA: NEGATIVE
NEISSERIA GONORRHOEAE, NAA: NEGATIVE
Trich vag by NAA: NEGATIVE

## 2016-06-06 ENCOUNTER — Inpatient Hospital Stay (HOSPITAL_COMMUNITY)
Admission: AD | Admit: 2016-06-06 | Discharge: 2016-06-06 | Disposition: A | Discharge status: Home / Self Care | Payer: Medicaid Other | Source: Ambulatory Visit | Attending: Obstetrics & Gynecology | Admitting: Obstetrics & Gynecology

## 2016-06-06 DIAGNOSIS — Z79899 Other long term (current) drug therapy: Secondary | ICD-10-CM | POA: Diagnosis not present

## 2016-06-06 DIAGNOSIS — O26891 Other specified pregnancy related conditions, first trimester: Secondary | ICD-10-CM | POA: Insufficient documentation

## 2016-06-06 DIAGNOSIS — R110 Nausea: Secondary | ICD-10-CM | POA: Diagnosis not present

## 2016-06-06 DIAGNOSIS — O219 Vomiting of pregnancy, unspecified: Secondary | ICD-10-CM

## 2016-06-06 DIAGNOSIS — Z3A10 10 weeks gestation of pregnancy: Secondary | ICD-10-CM | POA: Insufficient documentation

## 2016-06-06 DIAGNOSIS — R109 Unspecified abdominal pain: Secondary | ICD-10-CM | POA: Insufficient documentation

## 2016-06-06 DIAGNOSIS — N898 Other specified noninflammatory disorders of vagina: Secondary | ICD-10-CM

## 2016-06-06 LAB — URINALYSIS, ROUTINE W REFLEX MICROSCOPIC
Bilirubin Urine: NEGATIVE
Glucose, UA: NEGATIVE mg/dL
Hgb urine dipstick: NEGATIVE
KETONES UR: NEGATIVE mg/dL
LEUKOCYTES UA: NEGATIVE
NITRITE: NEGATIVE
PROTEIN: NEGATIVE mg/dL
Specific Gravity, Urine: 1.015 (ref 1.005–1.030)
pH: 7 (ref 5.0–8.0)

## 2016-06-06 LAB — WET PREP, GENITAL
CLUE CELLS WET PREP: NONE SEEN
Sperm: NONE SEEN
TRICH WET PREP: NONE SEEN
YEAST WET PREP: NONE SEEN

## 2016-06-06 MED ORDER — DOXYLAMINE-PYRIDOXINE 10-10 MG PO TBEC: 2.0000 | DELAYED_RELEASE_TABLET | Freq: Every evening | ORAL | 1 refills | Status: DC | PRN

## 2016-06-06 NOTE — MAU Note (Signed)
Patient presents with vaginal discharge two days ago clear with some brown in it, no discharge at present. and unable to eat no appetite, having lower abdominal discomfort.

## 2016-06-06 NOTE — MAU Provider Note (Signed)
Chief Complaint: Vaginal Discharge and Abdominal Pain   First Provider Initiated Contact with Patient 06/06/16 [phone]        SUBJECTIVE  HPI  Terry [name] is a 20 y.o. G1P0000 at [redacted]w[redacted]d by LMP who presents to maternity admissions reporting vaginal discharge with brown color.  States "cannot eat".  Does not have vomiting per se, "maybe" has some nausea, but describes gagging type sensation when swallowing food, so just doesn't eat anything except Ensure..  Some lower abdominal discomfort but does not describe as pain. She denies vaginal itching/burning, urinary symptoms, h/a, dizziness, n/v, or fever/chills.  States her family made her come in.  RN Note: Patient presents with vaginal discharge two days ago clear with some brown in it, no discharge at present. and unable to eat no appetite, having lower abdominal discomfort.   Past Medical History:  Diagnosis Date  . BV (bacterial vaginosis)   . Chlamydia   . Gonorrhea   . Vaginal yeast infection    Past Surgical History:  Procedure Laterality Date  . NO PAST SURGERIES     Social History   Social History  . Marital status: Single    Spouse name: N/A  . Number of children: N/A  . Years of education: N/A   Occupational History  . Not on file.   Social History Main Topics  . Smoking status: Never Smoker  . Smokeless tobacco: Never Used  . Alcohol use No  . Drug use: No  . Sexual activity: Yes    Birth control/ protection: Condom, Pill     Comment: 1 partner X 2 years   Other Topics Concern  . Not on file   Social History Narrative  . No narrative on file   No current facility-administered medications on file prior to encounter.    Current Outpatient Prescriptions on File Prior to Encounter  Medication Sig Dispense Refill  . acetaminophen (TYLENOL) 500 MG tablet Take 1 tablet (500 mg total) by mouth every 6 (six) hours as needed for mild pain or moderate pain. 30 tablet 0  . Prenat-FeAsp-Meth-FA-DHA w/o A (PRENATE  PIXIE) 10-0.6-0.4-200 MG CAPS Take 1 tablet by mouth daily. 30 capsule 12   No Known Allergies  I have reviewed patient's Past Medical Hx, Surgical Hx, Family Hx, Social Hx, medications and allergies.   ROS:  Review of Systems   Constitutional: Negative for fever and chills.  Gastrointestinal: Negative for vomiting, abdominal pain, diarrhea and constipation.  Positive for abdominal discomfort Genitourinary: Negative for dysuria. negative Positive for brown bleeding Musculoskeletal: Negative for back pain.  Neurological: Negative for dizziness and weakness.    Physical Exam  Patient Vitals for the past 24 hrs:  BP Temp Pulse Resp Height Weight  06/06/16 0828 135/88 98.5 F (36.9 C) 107 18 - -  06/06/16 0823 - - - -  (1.778 m) 124 lb 9.6 oz (56.5 kg)   Physical Exam  Constitutional: Well-developed, well-nourished female in no acute distress.  Cardiovascular: normal rate Respiratory: normal effort GI: Abd soft, non-tender. Pos BS x 4 MS: Extremities nontender, no edema, normal ROM Neurologic: Alert and oriented x 4.  GU: Neg CVAT.  PELVIC EXAM: Cervix pink, visually closed, without lesion, scant white creamy discharge, no bleeding vaginal walls and external genitalia normal Bimanual exam: Cervix 0/long/high, firm, anterior, neg CMT, uterus nontender, nonenlarged, adnexa without tenderness, enlargement, or mass  FHT 165 by doppler  LAB RESULTS Results for orders placed or performed during the hospital encounter of 06/06/16 (from  the past 72 hour(s))  Urinalysis, Routine w reflex microscopic (not at [facility])     Status: None   Collection Time: 06/06/16  8:20 AM  Result Value Ref Range   Color, Urine YELLOW YELLOW   APPearance CLEAR CLEAR   Specific Gravity, Urine 1.015 1.005 - 1.030   pH 7.0 5.0 - 8.0   Glucose, UA NEGATIVE NEGATIVE mg/dL   Hgb urine dipstick NEGATIVE NEGATIVE   Bilirubin Urine NEGATIVE NEGATIVE   Ketones, ur NEGATIVE NEGATIVE mg/dL   Protein, ur  NEGATIVE NEGATIVE mg/dL   Nitrite NEGATIVE NEGATIVE   Leukocytes, UA NEGATIVE NEGATIVE    Comment: MICROSCOPIC NOT DONE ON URINES WITH NEGATIVE PROTEIN, BLOOD, LEUKOCYTES, NITRITE, OR GLUCOSE <1000 mg/dL.  Wet prep, genital     Status: Abnormal   Collection Time: 06/06/16  8:49 AM  Result Value Ref Range   Yeast Wet Prep HPF POC NONE SEEN NONE SEEN   Trich, Wet Prep NONE SEEN NONE SEEN   Clue Cells Wet Prep HPF POC NONE SEEN NONE SEEN   WBC, Wet Prep HPF POC FEW (A) NONE SEEN    Comment: BACTERIA- TOO NUMEROUS TO COUNT   Sperm NONE SEEN     B/Positive/-- (07/12 1445)  IMAGING No results found.  MAU Management/MDM: Reviewed UA, no dehydration Able to tolerate PO intake Wet prep done to rule out bleeding, no blood seen Cervix closed, so doubt threatened abortion   ASSESSMENT SIUP at [redacted]w[redacted]d  Vaginal discharge, physiologic Nausea of early pregnancy  PLAN Discharge home Rx Diclegis for nausea Encouraged increase fluids, try different foods Follow up at [facility] for prenatal visit    Medication List    ASK your doctor about these medications   acetaminophen 500 MG tablet Commonly known as:  TYLENOL Take 1 tablet (500 mg total) by mouth every 6 (six) hours as needed for mild pain or moderate pain.   PRENATE PIXIE 10-0.6-0.4-200 MG Caps Take 1 tablet by mouth daily.       Pt stable at time of discharge. Encouraged to return here or to other Urgent Care/ED if she develops worsening of symptoms, increase in pain, fever, or other concerning symptoms.    Wynelle Bourgeois CNM, MSN Certified Nurse-Midwife 06/06/2016  8:40 AM

## 2016-06-06 NOTE — Discharge Instructions (Signed)
Morning Sickness °Morning sickness is when you feel sick to your stomach (nauseous) during pregnancy. This nauseous feeling may or may not come with vomiting. It often occurs in the morning but can be a problem any time of day. Morning sickness is most common during the first trimester, but it may continue throughout pregnancy. While morning sickness is unpleasant, it is usually harmless unless you develop severe and continual vomiting (hyperemesis gravidarum). This condition requires more intense treatment.  °CAUSES  °The cause of morning sickness is not completely known but seems to be related to normal hormonal changes that occur in pregnancy. °RISK FACTORS °You are at greater risk if you: °· Experienced nausea or vomiting before your pregnancy. °· Had morning sickness during a previous pregnancy. °· Are pregnant with more than one baby, such as twins. °TREATMENT  °Do not use any medicines (prescription, over-the-counter, or herbal) for morning sickness without first talking to your health care provider. Your health care provider may prescribe or recommend: °· Vitamin B6 supplements. °· Anti-nausea medicines. °· The herbal medicine ginger. °HOME CARE INSTRUCTIONS  °· Only take over-the-counter or prescription medicines as directed by your health care provider. °· Taking multivitamins before getting pregnant can prevent or decrease the severity of morning sickness in most women. °· Eat a piece of dry toast or unsalted crackers before getting out of bed in the morning. °· Eat five or six small meals a day. °· Eat dry and bland foods (rice, baked potato). Foods high in carbohydrates are often helpful. °· Do not drink liquids with your meals. Drink liquids between meals. °· Avoid greasy, fatty, and spicy foods. °· Get someone to cook for you if the smell of any food causes nausea and vomiting. °· If you feel nauseous after taking prenatal vitamins, take the vitamins at night or with a snack.  °· Snack on protein  foods (nuts, yogurt, cheese) between meals if you are hungry. °· Eat unsweetened gelatins for desserts. °· Wearing an acupressure wristband (worn for sea sickness) may be helpful. °· Acupuncture may be helpful. °· Do not smoke. °· Get a humidifier to keep the air in your house free of odors. °· Get plenty of fresh air. °SEEK MEDICAL CARE IF:  °· Your home remedies are not working, and you need medicine. °· You feel dizzy or lightheaded. °· You are losing weight. °SEEK IMMEDIATE MEDICAL CARE IF:  °· You have persistent and uncontrolled nausea and vomiting. °· You pass out (faint). °MAKE SURE YOU: °· Understand these instructions. °· Will watch your condition. °· Will get help right away if you are not doing well or get worse. °  °This information is not intended to replace advice given to you by your health care provider. Make sure you discuss any questions you have with your health care provider. °  °Document Released: 12/16/2006 Document Revised: 10/30/2013 Document Reviewed: 04/11/2013 °Elsevier Interactive Patient Education ©2016 [employer] ° ° ° °Eating Plan for Hyperemesis Gravidarum °Severe cases of hyperemesis gravidarum can lead to dehydration and malnutrition. The hyperemesis eating plan is one way to lessen the symptoms of nausea and vomiting. It is often used with prescribed medicines to control your symptoms.  °WHAT CAN I DO TO RELIEVE MY SYMPTOMS? °Listen to your body. Everyone is different and has different preferences. Find what works best for you. Some of the following things may help: °· Eat and drink slowly. °· Eat 5-6 small meals daily instead of 3 large meals.   °· Eat crackers before you   get out of bed in the morning.   °· Starchy foods are usually well tolerated (such as cereal, toast, bread, potatoes, pasta, rice, and pretzels).   °· Ginger may help with nausea. Add ¼ tsp ground ginger to hot tea or choose ginger tea.   °· Try drinking 100% fruit juice or an electrolyte drink. °· Continue  to take your prenatal vitamins as directed by your health care provider. If you are having trouble taking your prenatal vitamins, talk with your health care provider about different options. °· Include at least 1 serving of protein with your meals and snacks (such as meats or poultry, beans, nuts, eggs, or yogurt). Try eating a protein-rich snack before bed (such as cheese and crackers or a half turkey or peanut butter sandwich). °WHAT THINGS SHOULD I AVOID TO REDUCE MY SYMPTOMS? °The following things may help reduce your symptoms: °· Avoid foods with strong smells. Try eating meals in well-ventilated areas that are free of odors. °· Avoid drinking water or other beverages with meals. Try not to drink anything less than 30 minutes before and after meals. °· Avoid drinking more than 1 cup of fluid at a time. °· Avoid fried or high-fat foods, such as butter and cream sauces. °· Avoid spicy foods. °· Avoid skipping meals the best you can. Nausea can be more intense on an empty stomach. If you cannot tolerate food at that time, do not force it. Try sucking on ice chips or other frozen items and make up the calories later. °· Avoid lying down within 2 hours after eating. °  °This information is not intended to replace advice given to you by your health care provider. Make sure you discuss any questions you have with your health care provider. °  °Document Released: 08/22/2007 Document Revised: 10/30/2013 Document Reviewed: 08/29/2013 °Elsevier Interactive Patient Education ©2016 [employer] ° °

## 2016-06-12 ENCOUNTER — Inpatient Hospital Stay (HOSPITAL_COMMUNITY)
Admission: AD | Admit: 2016-06-12 | Discharge: 2016-06-12 | Disposition: A | Discharge status: Home / Self Care | Payer: Medicaid Other | Source: Ambulatory Visit | Attending: Family Medicine | Admitting: Family Medicine

## 2016-06-12 ENCOUNTER — Encounter (HOSPITAL_COMMUNITY): Payer: Self-pay | Admitting: Student

## 2016-06-12 DIAGNOSIS — Z8249 Family history of ischemic heart disease and other diseases of the circulatory system: Secondary | ICD-10-CM | POA: Diagnosis not present

## 2016-06-12 DIAGNOSIS — N898 Other specified noninflammatory disorders of vagina: Secondary | ICD-10-CM | POA: Diagnosis present

## 2016-06-12 DIAGNOSIS — Z833 Family history of diabetes mellitus: Secondary | ICD-10-CM | POA: Insufficient documentation

## 2016-06-12 DIAGNOSIS — Z3A11 11 weeks gestation of pregnancy: Secondary | ICD-10-CM | POA: Diagnosis not present

## 2016-06-12 DIAGNOSIS — B373 Candidiasis of vulva and vagina: Secondary | ICD-10-CM | POA: Diagnosis not present

## 2016-06-12 DIAGNOSIS — O23591 Infection of other part of genital tract in pregnancy, first trimester: Secondary | ICD-10-CM | POA: Insufficient documentation

## 2016-06-12 DIAGNOSIS — B3731 Acute candidiasis of vulva and vagina: Secondary | ICD-10-CM

## 2016-06-12 LAB — URINALYSIS, ROUTINE W REFLEX MICROSCOPIC
Bilirubin Urine: NEGATIVE
GLUCOSE, UA: NEGATIVE mg/dL
HGB URINE DIPSTICK: NEGATIVE
Ketones, ur: NEGATIVE mg/dL
Leukocytes, UA: NEGATIVE
Nitrite: NEGATIVE
PH: 5.5 (ref 5.0–8.0)
Protein, ur: NEGATIVE mg/dL
SPECIFIC GRAVITY, URINE: 1.025 (ref 1.005–1.030)

## 2016-06-12 LAB — WET PREP, GENITAL
Clue Cells Wet Prep HPF POC: NONE SEEN
Sperm: NONE SEEN
Trich, Wet Prep: NONE SEEN
YEAST WET PREP: NONE SEEN

## 2016-06-12 MED ORDER — TERCONAZOLE 0.8 % VA CREA: 1.0000 | TOPICAL_CREAM | Freq: Every day | VAGINAL | 0 refills | Status: DC

## 2016-06-12 NOTE — MAU Provider Note (Signed)
History     CSN: [id number]  Arrival date and time: 06/12/16 2009   First Provider Initiated Contact with Patient 06/12/16 2104        Chief Complaint  Patient presents with  . Vaginal Discharge   HPI  Terry [name] is a 20 y.o. G1P0000 at [redacted]w[redacted]d who presents with vaginal discharge & irritation. Symptoms started yesterday. Reports thick, curdy, white vaginal discharge with foul odor. Denies abdominal pain or vaginal bleeding. Has next prenatal visit with [facility] on Monday.   OB History    Gravida Para Term Preterm AB Living   1       0 0   SAB TAB Ectopic Multiple Live Births   0              Past Medical History:  Diagnosis Date  . Chlamydia   . Gonorrhea     Past Surgical History:  Procedure Laterality Date  . NO PAST SURGERIES      Family History  Problem Relation Age of Onset  . Cancer Maternal Grandmother     pancreatic  . Diabetes Maternal Uncle   . Diabetes Mother   . Hypertension Mother     Social History  Substance Use Topics  . Smoking status: Never Smoker  . Smokeless tobacco: Never Used  . Alcohol use No    Allergies: No Known Allergies  Prescriptions Prior to Admission  Medication Sig Dispense Refill Last Dose  . acetaminophen (TYLENOL) 500 MG tablet Take 1 tablet (500 mg total) by mouth every 6 (six) hours as needed for mild pain or moderate pain. 30 tablet 0 Taking  . Doxylamine-Pyridoxine 10-10 MG TBEC Take 2 tablets by mouth at bedtime as needed. 100 tablet 1   . Prenat-FeAsp-Meth-FA-DHA w/o A (PRENATE PIXIE) 10-0.6-0.4-200 MG CAPS Take 1 tablet by mouth daily. 30 capsule 12     Review of Systems  Constitutional: Negative.   Gastrointestinal: Negative.   Genitourinary: Negative for dysuria.       + vaginal discharge & itching/irritation   Physical Exam   Blood pressure 119/71, pulse 87, temperature 98.6 F (37 C), resp. rate 18, height 5\' 10"  (1.778 m), weight 127 lb 12.8 oz (58 kg), last menstrual period 03/10/2016.  Physical  Exam  Nursing note and vitals reviewed. Constitutional: She is oriented to person, place, and time. She appears well-developed and well-nourished. No distress.  HENT:  Head: Normocephalic and atraumatic.  Eyes: Conjunctivae are normal. Right eye exhibits no discharge. Left eye exhibits no discharge. No scleral icterus.  Neck: Normal range of motion.  Respiratory: Effort normal. No respiratory distress.  GI: Soft. There is no tenderness.  Genitourinary: Cervix exhibits no motion tenderness and no friability. Vaginal discharge (small amount of thick clumpy white discharge, adherant to vaginal walls) found.  Neurological: She is alert and oriented to person, place, and time.  Skin: Skin is warm and dry. She is not diaphoretic.  Psychiatric: She has a normal mood and affect. Her behavior is normal. Judgment and thought content normal.    MAU Course  Procedures Results for orders placed or performed during the hospital encounter of 06/12/16 (from the past 24 hour(s))  Urinalysis, Routine w reflex microscopic (not at [facility])     Status: None   Collection Time: 06/12/16  8:22 PM  Result Value Ref Range   Color, Urine YELLOW YELLOW   APPearance CLEAR CLEAR   Specific Gravity, Urine 1.025 1.005 - 1.030   pH 5.5 5.0 - 8.0  Glucose, UA NEGATIVE NEGATIVE mg/dL   Hgb urine dipstick NEGATIVE NEGATIVE   Bilirubin Urine NEGATIVE NEGATIVE   Ketones, ur NEGATIVE NEGATIVE mg/dL   Protein, ur NEGATIVE NEGATIVE mg/dL   Nitrite NEGATIVE NEGATIVE   Leukocytes, UA NEGATIVE NEGATIVE  Wet prep, genital     Status: Abnormal   Collection Time: 06/12/16  8:40 PM  Result Value Ref Range   Yeast Wet Prep HPF POC NONE SEEN NONE SEEN   Trich, Wet Prep NONE SEEN NONE SEEN   Clue Cells Wet Prep HPF POC NONE SEEN NONE SEEN   WBC, Wet Prep HPF POC FEW (A) NONE SEEN   Sperm NONE SEEN     MDM FHT 163 by doppler Wet prep negative -- will tx for yeast based on clinical judgement & pt complaint  Assessment and  Plan  A:  1. Vaginal yeast infection    P: Discharge home Rx terazol Discussed reasons to return to MAU Keep f/u with Tim Lair 06/12/2016, 8:22 PM

## 2016-06-12 NOTE — MAU Note (Signed)
Having white vag d/c that has foul odor. Some vag itching and back pain. 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 No bleeding

## 2016-06-12 NOTE — Discharge Instructions (Signed)
Monilial Vaginitis Vaginitis in a soreness, swelling and redness (inflammation) of the vagina and vulva. Monilial vaginitis is not a sexually transmitted infection. CAUSES  Yeast vaginitis is caused by yeast (candida) that is normally found in your vagina. With a yeast infection, the candida has overgrown in number to a point that upsets the chemical balance. SYMPTOMS   White, thick vaginal discharge.  Swelling, itching, redness and irritation of the vagina and possibly the lips of the vagina (vulva).  Burning or painful urination.  Painful intercourse. DIAGNOSIS  Things that may contribute to monilial vaginitis are:  Postmenopausal and virginal states.  Pregnancy.  Infections.  Being tired, sick or stressed, especially if you had monilial vaginitis in the past.  Diabetes. Good control will help lower the chance.  Birth control pills.  Tight fitting garments.  Using bubble bath, feminine sprays, douches or deodorant tampons.  Taking certain medications that kill germs (antibiotics).  Sporadic recurrence can occur if you become ill. TREATMENT  Your caregiver will give you medication.  There are several kinds of anti monilial vaginal creams and suppositories specific for monilial vaginitis. For recurrent yeast infections, use a suppository or cream in the vagina 2 times a week, or as directed.  Anti-monilial or steroid cream for the itching or irritation of the vulva may also be used. Get your caregiver's permission.  Painting the vagina with methylene blue solution may help if the monilial cream does not work.  Eating yogurt may help prevent monilial vaginitis. HOME CARE INSTRUCTIONS   Finish all medication as prescribed.  Do not have sex until treatment is completed or after your caregiver tells you it is okay.  Take warm sitz baths.  Do not douche.  Do not use tampons, especially scented ones.  Wear cotton underwear.  Avoid tight pants and panty  hose.  Tell your sexual partner that you have a yeast infection. They should go to their caregiver if they have symptoms such as mild rash or itching.  Your sexual partner should be treated as well if your infection is difficult to eliminate.  Practice safer sex. Use condoms.  Some vaginal medications cause latex condoms to fail. Vaginal medications that harm condoms are:  Cleocin cream.  Butoconazole (Femstat).  Terconazole (Terazol) vaginal suppository.  Miconazole (Monistat) (may be purchased over the counter). SEEK MEDICAL CARE IF:   You have a temperature by mouth above 102 F (38.9 C).  The infection is getting worse after 2 days of treatment.  The infection is not getting better after 3 days of treatment.  You develop blisters in or around your vagina.  You develop vaginal bleeding, and it is not your menstrual period.  You have pain when you urinate.  You develop intestinal problems.  You have pain with sexual intercourse.   This information is not intended to replace advice given to you by your health care provider. Make sure you discuss any questions you have with your health care provider.   Document Released: 08/04/2005 Document Revised: 01/17/2012 Document Reviewed: 04/28/2015 Elsevier Interactive Patient Education 2016 Elsevier Inc.  Back Pain in Pregnancy Back pain during pregnancy is common. It happens in about half of all pregnancies. It is important for you and your baby that you remain active during your pregnancy.If you feel that back pain is not allowing you to remain active or sleep well, it is time to see your caregiver. Back pain may be caused by several factors related to changes during your pregnancy.Fortunately, unless you had trouble  with your back before your pregnancy, the pain is likely to get better after you deliver. Low back pain usually occurs between the fifth and seventh months of pregnancy. It can, however, happen in the first  couple months. Factors that increase the risk of back problems include:   Previous back problems.  Injury to your back.  Having twins or multiple births.  A chronic cough.  Stress.  Job-related repetitive motions.  Muscle or spinal disease in the back.  Family history of back problems, ruptured (herniated) discs, or osteoporosis.  Depression, anxiety, and panic attacks. CAUSES   When you are pregnant, your body produces a hormone called relaxin. This hormonemakes the ligaments connecting the low back and pubic bones more flexible. This flexibility allows the baby to be delivered more easily. When your ligaments are loose, your muscles need to work harder to support your back. Soreness in your back can come from tired muscles. Soreness can also come from back tissues that are irritated since they are receiving less support.  As the baby grows, it puts pressure on the nerves and blood vessels in your pelvis. This can cause back pain.  As the baby grows and gets heavier during pregnancy, the uterus pushes the stomach muscles forward and changes your center of gravity. This makes your back muscles work harder to maintain good posture. SYMPTOMS  Lumbar pain during pregnancy Lumbar pain during pregnancy usually occurs at or above the waist in the center of the back. There may be pain and numbness that radiates into your leg or foot. This is similar to low back pain experienced by non-pregnant women. It usually increases with sitting for long periods of time, standing, or repetitive lifting. Tenderness may also be present in the muscles along your upper back. Posterior pelvic pain during pregnancy Pain in the back of the pelvis is more common than lumbar pain in pregnancy. It is a deep pain felt in your side at the waistline, or across the tailbone (sacrum), or in both places. You may have pain on one or both sides. This pain can also go into the buttocks and backs of the upper thighs. Pubic  and groin pain may also be present. The pain does not quickly resolve with rest, and morning stiffness may also be present. Pelvic pain during pregnancy can be brought on by most activities. A high level of fitness before and during pregnancy may or may not prevent this problem. Labor pain is usually 1 to 2 minutes apart, lasts for about 1 minute, and involves a bearing down feeling or pressure in your pelvis. However, if you are at term with the pregnancy, constant low back pain can be the beginning of early labor, and you should be aware of this. DIAGNOSIS  X-rays of the back should not be done during the first 12 to 14 weeks of the pregnancy and only when absolutely necessary during the rest of the pregnancy. MRIs do not give off radiation and are safe during pregnancy. MRIs also should only be done when absolutely necessary. HOME CARE INSTRUCTIONS  Exercise as directed by your caregiver. Exercise is the most effective way to prevent or manage back pain. If you have a back problem, it is especially important to avoid sports that require sudden body movements. Swimming and walking are great activities.  Do not stand in one place for long periods of time.  Do not wear high heels.  Sit in chairs with good posture. Use a pillow on your lower  back if necessary. Make sure your head rests over your shoulders and is not hanging forward.  Try sleeping on your side, preferably the left side, with a pillow or two between your legs. If you are sore after a night's rest, your bedmay betoo soft.Try placing a board between your mattress and box spring.  Listen to your body when lifting.If you are experiencing pain, ask for help or try bending yourknees more so you can use your leg muscles rather than your back muscles. Squat down when picking up something from the floor. Do not bend over.  Eat a healthy diet. Try to gain weight within your caregiver's recommendations.  Use heat or cold packs 3 to 4  times a day for 15 minutes to help with the pain.  Only take over-the-counter or prescription medicines for pain, discomfort, or fever as directed by your caregiver. Sudden (acute) back pain  Use bed rest for only the most extreme, acute episodes of back pain. Prolonged bed rest over 48 hours will aggravate your condition.  Ice is very effective for acute conditions.  Put ice in a plastic bag.  Place a towel between your skin and the bag.  Leave the ice on for 10 to 20 minutes every 2 hours, or as needed.  Using heat packs for 30 minutes prior to activities is also helpful. Continued back pain See your caregiver if you have continued problems. Your caregiver can help or refer you for appropriate physical therapy. With conditioning, most back problems can be avoided. Sometimes, a more serious issue may be the cause of back pain. You should be seen right away if new problems seem to be developing. Your caregiver may recommend:  A maternity girdle.  An elastic sling.  A back brace.  A massage therapist or acupuncture. SEEK MEDICAL CARE IF:   You are not able to do most of your daily activities, even when taking the pain medicine you were given.  You need a referral to a physical therapist or chiropractor.  You want to try acupuncture. SEEK IMMEDIATE MEDICAL CARE IF:  You develop numbness, tingling, weakness, or problems with the use of your arms or legs.  You develop severe back pain that is no longer relieved with medicines.  You have a sudden change in bowel or bladder control.  You have increasing pain in other areas of the body.  You develop shortness of breath, dizziness, or fainting.  You develop nausea, vomiting, or sweating.  You have back pain which is similar to labor pains.  You have back pain along with your water breaking or vaginal bleeding.  You have back pain or numbness that travels down your leg.  Your back pain developed after you fell.  You  develop pain on one side of your back. You may have a kidney stone.  You see blood in your urine. You may have a bladder infection or kidney stone.  You have back pain with blisters. You may have shingles. Back pain is fairly common during pregnancy but should not be accepted as just part of the process. Back pain should always be treated as soon as possible. This will make your pregnancy as pleasant as possible.   This information is not intended to replace advice given to you by your health care provider. Make sure you discuss any questions you have with your health care provider.   Document Released: 02/02/2006 Document Revised: 01/17/2012 Document Reviewed: 03/16/2011 Elsevier Interactive Patient Education [employer]

## 2016-06-16 ENCOUNTER — Ambulatory Visit (INDEPENDENT_AMBULATORY_CARE_PROVIDER_SITE_OTHER): Payer: Medicaid Other | Admitting: Certified Nurse Midwife

## 2016-06-16 ENCOUNTER — Encounter: Payer: Self-pay | Admitting: *Deleted

## 2016-06-16 VITALS — BP 121/86 | HR 109 | Temp 98.6°F | Wt 122.2 lb

## 2016-06-16 DIAGNOSIS — Z331 Pregnant state, incidental: Secondary | ICD-10-CM | POA: Diagnosis not present

## 2016-06-16 DIAGNOSIS — Z1389 Encounter for screening for other disorder: Secondary | ICD-10-CM | POA: Diagnosis not present

## 2016-06-16 DIAGNOSIS — Z3401 Encounter for supervision of normal first pregnancy, first trimester: Secondary | ICD-10-CM

## 2016-06-16 LAB — POCT URINALYSIS DIPSTICK
BILIRUBIN UA: NEGATIVE
GLUCOSE UA: NEGATIVE
KETONES UA: NEGATIVE
Leukocytes, UA: NEGATIVE
Nitrite, UA: NEGATIVE
Protein, UA: 1
RBC UA: NEGATIVE
SPEC GRAV UA: 1.01
UROBILINOGEN UA: 0.2
pH, UA: 7

## 2016-06-16 NOTE — Progress Notes (Signed)
[facility]. She states she has to lift heavy boxes at work, would like restrictions.

## 2016-06-16 NOTE — Progress Notes (Signed)
  Subjective:    Terry [name] is a 20 y.o. female being seen today for her obstetrical visit. She is at 2233w2d gestation. Patient reports: backache, no bleeding, no contractions, no cramping and no leaking.  Has been seen in triage multiple times over the past month for back pain.  Desires work Physicist, medicalletter for work restrictions for lifting.    Problem List Items Addressed This Visit    None    Visit Diagnoses    Supervision of normal first pregnancy, antepartum, first trimester    -  Primary   Relevant Orders   MaterniT21 PLUS Core+SCA     Patient Active Problem List   Diagnosis Date Noted  . Supervision of normal first pregnancy in first trimester 05/19/2016  . Pelvic pain in female 07/28/2015    Objective:     BP 121/86   Pulse (!) 109   Temp 98.6 F (37 C)   Wt 122 lb 3.2 oz (55.4 kg)   LMP 03/10/2016 (Exact Date)   BMI 17.53 kg/m  Uterine Size: Below umbilicus   FHR: 150 by doppler  Assessment:    Pregnancy @ 6833w2d  weeks Doing well    Lumbar back pain  Plan:    Problem list reviewed and updated. Labs reviewed.  Follow up in 4 weeks. FIRST/CF mutation testing/NIPT/QUAD SCREEN/fragile X/Ashkenazi Jewish population testing/Spinal muscular atrophy discussed: ordered. Role of ultrasound in pregnancy discussed; fetal survey: requested. Amniocentesis discussed: not indicated. 50% of 15 minute visit spent on counseling and coordination of care.

## 2016-06-24 ENCOUNTER — Other Ambulatory Visit: Payer: Self-pay | Admitting: Certified Nurse Midwife

## 2016-06-24 DIAGNOSIS — Z3401 Encounter for supervision of normal first pregnancy, first trimester: Secondary | ICD-10-CM

## 2016-06-24 LAB — MATERNIT21 PLUS CORE+SCA
Chromosome 13: NEGATIVE
Chromosome 18: NEGATIVE
Chromosome 21: NEGATIVE
PDF: 0
Y Chromosome: NOT DETECTED

## 2016-06-27 ENCOUNTER — Encounter (HOSPITAL_COMMUNITY): Payer: Self-pay | Admitting: Emergency Medicine

## 2016-06-27 ENCOUNTER — Emergency Department (HOSPITAL_COMMUNITY): Payer: Medicaid Other

## 2016-06-27 ENCOUNTER — Emergency Department (HOSPITAL_COMMUNITY)
Admission: EM | Admit: 2016-06-27 | Discharge: 2016-06-28 | Disposition: A | Discharge status: Home / Self Care | Payer: Medicaid Other | Attending: Emergency Medicine | Admitting: Emergency Medicine

## 2016-06-27 DIAGNOSIS — Z79899 Other long term (current) drug therapy: Secondary | ICD-10-CM | POA: Insufficient documentation

## 2016-06-27 DIAGNOSIS — R55 Syncope and collapse: Secondary | ICD-10-CM | POA: Diagnosis not present

## 2016-06-27 LAB — CBC WITH DIFFERENTIAL/PLATELET
BASOS ABS: 0 10*3/uL (ref 0.0–0.1)
Basophils Relative: 0 %
EOS PCT: 0 %
Eosinophils Absolute: 0.1 10*3/uL (ref 0.0–0.7)
HEMATOCRIT: 36.3 % (ref 36.0–46.0)
HEMOGLOBIN: 12.7 g/dL (ref 12.0–15.0)
Lymphocytes Relative: 13 %
Lymphs Abs: 1.8 10*3/uL (ref 0.7–4.0)
MCH: 32.7 pg (ref 26.0–34.0)
MCHC: 35 g/dL (ref 30.0–36.0)
MCV: 93.6 fL (ref 78.0–100.0)
MONOS PCT: 4 %
Monocytes Absolute: 0.5 10*3/uL (ref 0.1–1.0)
NEUTROS PCT: 83 %
Neutro Abs: 12 10*3/uL — ABNORMAL HIGH (ref 1.7–7.7)
PLATELETS: 246 10*3/uL (ref 150–400)
RBC: 3.88 MIL/uL (ref 3.87–5.11)
RDW: 12.5 % (ref 11.5–15.5)
WBC: 14.4 10*3/uL — ABNORMAL HIGH (ref 4.0–10.5)

## 2016-06-27 LAB — BASIC METABOLIC PANEL
Anion gap: 7 (ref 5–15)
BUN: 8 mg/dL (ref 6–20)
CALCIUM: 9.1 mg/dL (ref 8.9–10.3)
CO2: 24 mmol/L (ref 22–32)
CREATININE: 0.58 mg/dL (ref 0.44–1.00)
Chloride: 103 mmol/L (ref 101–111)
GFR calc non Af Amer: >60 mL/min (ref >60)
GLUCOSE: 113 mg/dL — AB (ref 65–99)
Potassium: 3.4 mmol/L — ABNORMAL LOW (ref 3.5–5.1)
Sodium: 134 mmol/L — ABNORMAL LOW (ref 135–145)

## 2016-06-27 LAB — CBG MONITORING, ED: GLUCOSE-CAPILLARY: 99 mg/dL (ref 65–99)

## 2016-06-27 MED ORDER — SODIUM CHLORIDE 0.9 % IV BOLUS (SEPSIS)
1000.0000 mL | Freq: Once | INTRAVENOUS | Status: AC
  Administered 2016-06-27: 1000 mL via INTRAVENOUS

## 2016-06-27 NOTE — Discharge Instructions (Addendum)
Read the information below.   Your labs are re-assuring. Your chest x-ray was normal. We discussed with pregnancy the increase risk of blood clots, specifically blood clots to your lungs. After discussion of risks and benefits, at this time, you declined further imaging to evaluate for blood clots and opted for observation. Please return to the ED immediately if you develop loss of consciousness, chest pain with breathing, you become short of breath with minimal exertion, or ou develop unilateral leg pain or swelling, or coughing up blood.  You endorsed improvement in your symptoms following hydration.  Be sure to eat regularly and stay hydrated.  Please follow up with your OBGYN for re-evaluation this week.  You may return to the Emergency Department at any time for worsening condition or any new symptoms that concern you. Return to ED if you develop fever, loss of consciousness, chest pain, or shortness of breath.

## 2016-06-27 NOTE — ED Triage Notes (Signed)
Pt was working in [employer] and suddenly felt hands shaking, confused, dizzy, near syncope. Pt states other employees gave patient candy and patient began feeling better. Pt states that she is [redacted] weeks pregnant.

## 2016-06-27 NOTE — ED Notes (Signed)
I have ambulated pt from her exam rm 11 to rest room 4 on the hall way, pulse remained between 100-110, O2 sats remained 100%, pt denies dizziness or any other symptoms.

## 2016-06-27 NOTE — ED Notes (Signed)
Below order not completed by EW. 

## 2016-06-27 NOTE — ED Notes (Signed)
Bed: WA11 Expected date:  Expected time:  Means of arrival:  Comments: Pt in room  

## 2016-06-29 NOTE — ED Provider Notes (Signed)
[facility]-EMERGENCY DEPT Provider Note   CSN: [id number] Arrival date & time: 06/27/16  [id number]     History   Chief Complaint Chief Complaint  Patient presents with  . Near Syncope    HPI Terry [name] is a 20 y.o. female.  Terry [name] is a 20 y.o. female G1P0 6225w1d presents to ED with complaint of near syncope. Patient working in [employer] and putting down a tray when she had a sudden onset lightheadedness, headache, spots in vision, shaking, clammy/cold skin, palpitations, and difficulty breathing.  Her co-workers helped her to a chair and gave her water and a peppermint - she endorses feeling better. She did not lose consciousness and did not hit her head. On initial evaluation patient continues to endorse feeling lightheaded. She currently denies fever, nausea, vomiting, abdominal pain, chest pain, shortness of breath, changes in vision, dysuria, hematuria, vaginal d/c/beeding, vaginal pain, neck pain, or rashes.        Past Medical History:  Diagnosis Date  . Chlamydia   . Gonorrhea     Patient Active Problem List   Diagnosis Date Noted  . Supervision of normal first pregnancy in first trimester 05/19/2016  . Pelvic pain in female 07/28/2015    Past Surgical History:  Procedure Laterality Date  . NO PAST SURGERIES      OB History    Gravida Para Term Preterm AB Living   1       0 0   SAB TAB Ectopic Multiple Live Births   0               Home Medications    Prior to Admission medications   Medication Sig Start Date End Date Taking? Authorizing Provider  Prenat-FeAsp-Meth-FA-DHA w/o A (PRENATE PIXIE) 10-0.6-0.4-200 MG CAPS Take 1 tablet by mouth daily. 05/19/16  Yes Rachelle A Denney, CNM  acetaminophen (TYLENOL) 500 MG tablet Take 1 tablet (500 mg total) by mouth every 6 (six) hours as needed for mild pain or moderate pain. 05/04/16   Antony MaduraKelly Humes, PA-C  Doxylamine-Pyridoxine 10-10 MG TBEC Take 2 tablets by mouth at bedtime as needed. Patient taking  differently: Take 2 tablets by mouth at bedtime as needed (nausea and vomiting).  06/06/16   Aviva SignsMarie L Williams, CNM  terconazole (TERAZOL 3) 0.8 % vaginal cream Place 1 applicator vaginally at bedtime. Patient not taking: Reported on 06/27/2016 06/12/16   Judeth HornErin Lawrence, NP    Family History Family History  Problem Relation Age of Onset  . Cancer Maternal Grandmother     pancreatic  . Diabetes Maternal Uncle   . Diabetes Mother   . Hypertension Mother     Social History Social History  Substance Use Topics  . Smoking status: Never Smoker  . Smokeless tobacco: Never Used  . Alcohol use No     Allergies   Review of patient's allergies indicates no known allergies.   Review of Systems Review of Systems  Constitutional: Negative for chills, diaphoresis and fever.  HENT: Negative for trouble swallowing.   Eyes: Negative for visual disturbance.  Respiratory: Negative for shortness of breath.   Cardiovascular: Negative for chest pain.  Gastrointestinal: Negative for abdominal pain, blood in stool, constipation, diarrhea, nausea and vomiting.  Genitourinary: Negative for dysuria and hematuria.  Musculoskeletal: Negative for neck pain.  Skin: Negative for rash.  Neurological: Positive for light-headedness. Negative for syncope, weakness and numbness.     Physical Exam Updated Vital Signs BP 99/59   Pulse 77   Temp 98.2  F (36.8 C) (Oral)   Resp 18   Ht 5\' 10"  (1.778 m)   Wt 59 kg   LMP 03/10/2016 (Exact Date)   SpO2 99%   BMI 18.65 kg/m   Physical Exam  Constitutional: She appears well-developed and well-nourished. No distress.  HENT:  Head: Normocephalic and atraumatic.  Mouth/Throat: Oropharynx is clear and moist. No oropharyngeal exudate.  Eyes: Conjunctivae and EOM are normal. Pupils are equal, round, and reactive to light. Right eye exhibits no discharge. Left eye exhibits no discharge. No scleral icterus.  Neck: Normal range of motion. Neck supple.    Cardiovascular: Normal rate, regular rhythm, normal heart sounds and intact distal pulses.   No murmur heard. Pulmonary/Chest: Effort normal and breath sounds normal. No respiratory distress.  Abdominal: Soft. Bowel sounds are normal. There is no tenderness. There is no rebound and no guarding.  Musculoskeletal: Normal range of motion.  Lymphadenopathy:    She has no cervical adenopathy.  Neurological: She is alert. She is not disoriented. Coordination normal. GCS eye subscore is 4. GCS verbal subscore is 5. GCS motor subscore is 6.  Mental Status:  Alert, thought content appropriate, able to give a coherent history. Speech fluent without evidence of aphasia. Able to follow 2 step commands without difficulty.  Cranial Nerves:  II:  Peripheral visual fields grossly normal, pupils equal, round, reactive to light III,IV, VI: ptosis not present, extra-ocular motions intact bilaterally  V,VII: smile symmetric, facial light touch sensation equal VIII: hearing grossly normal to voice  X: uvula elevates symmetrically  XI: bilateral shoulder shrug symmetric and strong XII: midline tongue extension without fassiculations Motor:  Normal tone. 5/5 in upper and lower extremities bilaterally including strong and equal grip strength and dorsiflexion/plantar flexion Sensory: light touch normal in all extremities Cerebellar: normal finger-to-nose with bilateral upper extremities Gait: normal gait and balance CV: distal pulses palpable throughout    Skin: Skin is warm and dry. She is not diaphoretic.  Psychiatric: She has a normal mood and affect. Her behavior is normal.     ED Treatments / Results  Labs (all labs ordered are listed, but only abnormal results are displayed) Labs Reviewed  BASIC METABOLIC PANEL - Abnormal; Notable for the following:       Result Value   Sodium 134 (*)    Potassium 3.4 (*)    Glucose, Bld 113 (*)    All other components within normal limits  CBC WITH  DIFFERENTIAL/PLATELET - Abnormal; Notable for the following:    WBC 14.4 (*)    Neutro Abs 12.0 (*)    All other components within normal limits  CBG MONITORING, ED    EKG  EKG Interpretation  Date/Time:  Friday June 18 2016 14:36:02 EDT Ventricular Rate:  85 PR Interval:  136 QRS Duration: 88 QT Interval:  376 QTC Calculation: 447 R Axis:   93 Text Interpretation:  Normal sinus rhythm Right atrial enlargement Rightward axis Pulmonary disease pattern Abnormal ECG No previous ECGs available Confirmed by Read Drivers  MD, Jonny Ruiz ([id number]) on 06/28/2016 2:01:19 PM       Radiology Dg Chest 2 View  Result Date: 06/27/2016 CLINICAL DATA:  Near syncope at work tonight. EXAM: CHEST  2 VIEW COMPARISON:  04/10/2016 FINDINGS: The cardiomediastinal contours are normal. The lungs are clear. Pulmonary vasculature is normal. No consolidation, pleural effusion, or pneumothorax. No acute osseous abnormalities are seen. IMPRESSION: Normal chest radiographs. Electronically Signed   By: Rubye Oaks M.D.   On: 06/27/2016 22:46  Procedures Procedures (including critical care time)  Medications Ordered in ED Medications  sodium chloride 0.9 % bolus 1,000 mL (0 mLs Intravenous Stopped 06/27/16 2222)   Vitals:   06/27/16 1851 06/27/16 2347 06/28/16 0000 06/28/16 0051  BP:  96/57 95/55 99/59   Pulse:  88 70 77  Resp:  16  18  Temp:      TempSrc:      SpO2:  99% 100% 99%  Weight: 59 kg     Height: 5\' 10"  (1.778 m)        Initial Impression / Assessment and Plan / ED Course  I have reviewed the triage vital signs and the nursing notes.  Pertinent labs & imaging results that were available during my care of the patient were reviewed by me and considered in my medical decision making (see chart for details).  Clinical Course  Comment By Time  On re-evaluation patient endorses improvement.  Lona Kettleshley Laurel Diamond[place], [place]-C 08/20 2109    Patient is afebrile and non-toxic appearing in NAD. She is  resting comfortably in the bed. Patient states all sxs have resolved except feeling lightheaded. Vital signs remarkable for soft blood pressures, otherwise stable. Physical exam is re-assuring. Normal neurologic exam, no focal neuro deficits. Patient able to ambulate with steady gait. Will check basic labs, EKG, CXR. IVF initiated.   BMP re-assuring. CBC remarkable for mildly elevated WBC; however, review of previous records show this is stable. EKG concerning for pulmonary pattern. CXR shows no acute cardiopulmonary process. Patient is currently pregnant, placing her at increase risk of blood clots. Patient denies h/o blood clots, family h/o of blood clots, hemoptysis, recent long distance travel/surgery/immobilization, clotting disorders, cancer, unilateral leg swelling/pain, SOB, or chest pain. Patient O2 sats 99/100% at rest and with ambulation, respirations unlabored, normal heart rate. Patient denied sxs while ambulating. Discussed with patient EKG findings and increased risk of blood clots. Participated in shared medical decision making regarding observation vs. Imaging chest. Discussed risks and benefits of imaging. Patient at this time has opted for observation. Suspect sxs may be secondary to ?low blood glucose - given improvement following candy vs. ?dehydration. Encouraged follow up with OBGYN this week. Discussed strict return precautions. Patient voiced understanding and is agreeable.   Final Clinical Impressions(s) / ED Diagnoses   Final diagnoses:  Near syncope    New Prescriptions Discharge Medication List as of 06/28/2016 12:36 AM       Lona KettleAshley Laurel Mithra Spano, PA-C 06/29/16 1321    Derwood KaplanAnkit Nanavati, MD 06/29/16 1528

## 2016-07-02 ENCOUNTER — Encounter (HOSPITAL_COMMUNITY): Payer: Self-pay | Admitting: Certified Nurse Midwife

## 2016-07-02 ENCOUNTER — Inpatient Hospital Stay (HOSPITAL_COMMUNITY)
Admission: AD | Admit: 2016-07-02 | Discharge: 2016-07-02 | Disposition: A | Discharge status: Home / Self Care | Payer: Medicaid Other | Source: Ambulatory Visit | Attending: Obstetrics & Gynecology | Admitting: Obstetrics & Gynecology

## 2016-07-02 ENCOUNTER — Encounter (HOSPITAL_COMMUNITY): Payer: Self-pay | Admitting: Family Medicine

## 2016-07-02 ENCOUNTER — Ambulatory Visit (HOSPITAL_COMMUNITY)
Admission: EM | Admit: 2016-07-02 | Discharge: 2016-07-02 | Disposition: A | Discharge status: Home / Self Care | Payer: Medicaid Other | Attending: Physician Assistant | Admitting: Physician Assistant

## 2016-07-02 DIAGNOSIS — O98812 Other maternal infectious and parasitic diseases complicating pregnancy, second trimester: Secondary | ICD-10-CM

## 2016-07-02 DIAGNOSIS — L239 Allergic contact dermatitis, unspecified cause: Secondary | ICD-10-CM

## 2016-07-02 DIAGNOSIS — Z3A15 15 weeks gestation of pregnancy: Secondary | ICD-10-CM | POA: Diagnosis not present

## 2016-07-02 DIAGNOSIS — B373 Candidiasis of vulva and vagina: Secondary | ICD-10-CM | POA: Diagnosis not present

## 2016-07-02 DIAGNOSIS — L200 Besnier's prurigo: Secondary | ICD-10-CM

## 2016-07-02 DIAGNOSIS — Z3A14 14 weeks gestation of pregnancy: Secondary | ICD-10-CM | POA: Diagnosis present

## 2016-07-02 DIAGNOSIS — N898 Other specified noninflammatory disorders of vagina: Secondary | ICD-10-CM | POA: Diagnosis not present

## 2016-07-02 DIAGNOSIS — Z79899 Other long term (current) drug therapy: Secondary | ICD-10-CM | POA: Diagnosis not present

## 2016-07-02 DIAGNOSIS — O99342 Other mental disorders complicating pregnancy, second trimester: Secondary | ICD-10-CM | POA: Insufficient documentation

## 2016-07-02 LAB — URINALYSIS, ROUTINE W REFLEX MICROSCOPIC
Bilirubin Urine: NEGATIVE
Glucose, UA: NEGATIVE mg/dL
HGB URINE DIPSTICK: NEGATIVE
Ketones, ur: NEGATIVE mg/dL
Leukocytes, UA: NEGATIVE
NITRITE: NEGATIVE
Protein, ur: NEGATIVE mg/dL
Specific Gravity, Urine: 1.015 (ref 1.005–1.030)
pH: 8.5 — ABNORMAL HIGH (ref 5.0–8.0)

## 2016-07-02 LAB — WET PREP, GENITAL
Clue Cells Wet Prep HPF POC: NONE SEEN
SPERM: NONE SEEN
TRICH WET PREP: NONE SEEN
YEAST WET PREP: NONE SEEN

## 2016-07-02 MED ORDER — HYDROCORTISONE 1 % EX CREA: TOPICAL_CREAM | CUTANEOUS | 0 refills | Status: DC

## 2016-07-02 MED ORDER — TERCONAZOLE 0.4 % VA CREA: 1.0000 | TOPICAL_CREAM | Freq: Every day | VAGINAL | 0 refills | Status: DC

## 2016-07-02 NOTE — MAU Provider Note (Signed)
Chief Complaint: Vaginal Itching   First Provider Initiated Contact with Patient 07/02/16 1625        SUBJECTIVE HPI: Terry [name] is a 20 y.o. G1P0000 at 7435w4d by LMP who presents to maternity admissions reporting vaginal itching.  Also worried about exposure to STDs by boyfriend.  Was treated 3 weeks ago with Terazol 7 for subclinical yeast.  States it felt better then itching returned.  States Urgent care doctor told her it was an allergic reaction.  Reports stress and depression with relationship... Denies suicidal ideation. Reports she was suicidal years ago. She denies vaginal bleeding,urinary symptoms, h/a, dizziness, n/v, or fever/chills.    Vaginal Itching  The patient's primary symptoms include genital itching and vaginal discharge. The patient's pertinent negatives include no genital lesions, genital rash, pelvic pain or vaginal bleeding. This is a recurrent problem. The current episode started in the past 7 days. The problem occurs constantly. The problem has been unchanged. The patient is experiencing no pain. She is pregnant. Pertinent negatives include no abdominal pain, back pain, constipation, diarrhea, dysuria, fever, headaches, nausea, rash or vomiting. The vaginal discharge was milky. There has been no bleeding. She has not been passing clots. She has not been passing tissue. Nothing aggravates the symptoms. She has tried nothing for the symptoms.   RN Note: Was treated for a yeast infection. . 2 or 3 days ago noted vaginal itching and irritation. Can't sit still because she is so irritated.  Went to UC, they did not do any further testing, looked at it and said it looked like she had an allergic reaction.    Past Medical History:  Diagnosis Date  . Chlamydia   . Gonorrhea    Past Surgical History:  Procedure Laterality Date  . NO PAST SURGERIES     Social History   Social History  . Marital status: Single    Spouse name: N/A  . Number of children: N/A  . Years  of education: N/A   Occupational History  . Not on file.   Social History Main Topics  . Smoking status: Never Smoker  . Smokeless tobacco: Never Used  . Alcohol use No  . Drug use: No  . Sexual activity: Yes    Birth control/ protection: Condom, Pill     Comment: 1 partner X 2 years   Other Topics Concern  . Not on file   Social History Narrative  . No narrative on file   No current facility-administered medications on file prior to encounter.    Current Outpatient Prescriptions on File Prior to Encounter  Medication Sig Dispense Refill  . Prenat-FeAsp-Meth-FA-DHA w/o A (PRENATE PIXIE) 10-0.6-0.4-200 MG CAPS Take 1 tablet by mouth daily. 30 capsule 12  . acetaminophen (TYLENOL) 500 MG tablet Take 1 tablet (500 mg total) by mouth every 6 (six) hours as needed for mild pain or moderate pain. (Patient not taking: Reported on 07/02/2016) 30 tablet 0  . Doxylamine-Pyridoxine 10-10 MG TBEC Take 2 tablets by mouth at bedtime as needed. (Patient not taking: Reported on 07/02/2016) 100 tablet 1  . hydrocortisone cream 1 % Apply to affected area 2 times daily (Patient not taking: Reported on 07/02/2016) 15 g 0  . terconazole (TERAZOL 3) 0.8 % vaginal cream Place 1 applicator vaginally at bedtime. (Patient not taking: Reported on 06/27/2016) 20 g 0   No Known Allergies  I have reviewed patient's Past Medical Hx, Surgical Hx, Family Hx, Social Hx, medications and allergies.   ROS:  Review  of Systems  Constitutional: Negative for fever.  Gastrointestinal: Negative for abdominal pain, constipation, diarrhea, nausea and vomiting.  Genitourinary: Positive for vaginal discharge. Negative for dysuria and pelvic pain.  Musculoskeletal: Negative for back pain.  Skin: Negative for rash.  Neurological: Negative for headaches.   Other systems negative  Physical Exam  Patient Vitals for the past 24 hrs:  BP Temp Temp src Pulse Resp Weight  07/02/16 1556 101/72 98.9 F (37.2 C) Oral 106 18 58.2  kg (128 lb 6.4 oz)   Physical Exam  Constitutional: Well-developed, well-nourished female in no acute distress.  Cardiovascular: normal rate Respiratory: normal effort GI: Abd soft, non-tender. Pos BS x 4 MS: Extremities nontender, no edema, normal ROM Neurologic: Alert and oriented x 4.  GU: Neg CVAT.  PELVIC EXAM: Cervix pink, visually closed, without lesion, scant white creamy discharge, vaginal walls and external genitalia normal    No erethema, no lesions Bimanual exam: Cervix 0/long/high, firm, anterior, neg CMT, uterus nontender, nonenlarged, adnexa without tenderness, enlargement, or mass   LAB RESULTS Results for orders placed or performed during the hospital encounter of 07/02/16 (from the past 24 hour(s))  Urinalysis, Routine w reflex microscopic (not at [facility])     Status: Abnormal   Collection Time: 07/02/16  4:00 PM  Result Value Ref Range   Color, Urine YELLOW YELLOW   APPearance CLEAR CLEAR   Specific Gravity, Urine 1.015 1.005 - 1.030   pH 8.5 (H) 5.0 - 8.0   Glucose, UA NEGATIVE NEGATIVE mg/dL   Hgb urine dipstick NEGATIVE NEGATIVE   Bilirubin Urine NEGATIVE NEGATIVE   Ketones, ur NEGATIVE NEGATIVE mg/dL   Protein, ur NEGATIVE NEGATIVE mg/dL   Nitrite NEGATIVE NEGATIVE   Leukocytes, UA NEGATIVE NEGATIVE  Wet prep, genital     Status: Abnormal   Collection Time: 07/02/16  4:32 PM  Result Value Ref Range   Yeast Wet Prep HPF POC NONE SEEN NONE SEEN   Trich, Wet Prep NONE SEEN NONE SEEN   Clue Cells Wet Prep HPF POC NONE SEEN NONE SEEN   WBC, Wet Prep HPF POC MODERATE (A) NONE SEEN   Sperm NONE SEEN     B/Positive/-- (07/12 1445)  IMAGING Dg Chest 2 View  Result Date: 06/27/2016 CLINICAL DATA:  Near syncope at work tonight. EXAM: CHEST  2 VIEW COMPARISON:  04/10/2016 FINDINGS: The cardiomediastinal contours are normal. The lungs are clear. Pulmonary vasculature is normal. No consolidation, pleural effusion, or pneumothorax. No acute osseous abnormalities are  seen. IMPRESSION: Normal chest radiographs. Electronically Signed   By: Rubye Oaks M.D.   On: 06/27/2016 22:46    MAU Management/MDM: Exam negative for erethema or lesions Some white discharge Wet prep negative Discussed could be subclinical yeast, but may just be irritation Cultures sent per request   ASSESSMENT SIUP at [redacted]w[redacted]d Vaginal itching and irritation Situational depression  PLAN Discharge home Rx Terazol for prn use for itching Resource given for [facility] for counseling STD tests pending   Pt stable at time of discharge. Encouraged to return here or to other Urgent Care/ED if she develops worsening of symptoms, increase in pain, fever, or other concerning symptoms.    Wynelle Bourgeois CNM, MSN Certified Nurse-Midwife 07/02/2016  4:46 PM

## 2016-07-02 NOTE — ED Triage Notes (Signed)
Pt here for vaginal itching. sts she was treated for yeast infection a week ago and improved but now itching. Denies any pain or discharge,

## 2016-07-02 NOTE — Discharge Instructions (Signed)

## 2016-07-02 NOTE — MAU Note (Signed)
Was treated for a yeast infection. . 2 or 3 days ago noted vaginal itching and irritation. Can't sit still because she is so irritated.  Went to UC, they did not do any further testing, looked at it and said it looked like she had an allergic reaction.

## 2016-07-02 NOTE — Discharge Instructions (Signed)
USE HYDROCORTISONE OINTMENT  FOLLOW UP WITH YOUR DOCTOR IF ADDITIONAL TREATMENT FOR YEAST INFECTION IS NEEDED.

## 2016-07-05 LAB — GC/CHLAMYDIA PROBE AMP (~~LOC~~) NOT AT ARMC
CHLAMYDIA, DNA PROBE: NEGATIVE
NEISSERIA GONORRHEA: NEGATIVE

## 2016-07-14 ENCOUNTER — Ambulatory Visit (INDEPENDENT_AMBULATORY_CARE_PROVIDER_SITE_OTHER): Payer: Medicaid Other | Admitting: Certified Nurse Midwife

## 2016-07-14 VITALS — BP 114/79 | HR 105 | Temp 98.9°F | Wt 128.4 lb

## 2016-07-14 DIAGNOSIS — Z331 Pregnant state, incidental: Secondary | ICD-10-CM

## 2016-07-14 DIAGNOSIS — Z3402 Encounter for supervision of normal first pregnancy, second trimester: Secondary | ICD-10-CM

## 2016-07-14 DIAGNOSIS — Z1389 Encounter for screening for other disorder: Secondary | ICD-10-CM | POA: Diagnosis not present

## 2016-07-14 LAB — POCT URINALYSIS DIPSTICK
BILIRUBIN UA: NEGATIVE
GLUCOSE UA: NEGATIVE
KETONES UA: NEGATIVE
Leukocytes, UA: NEGATIVE
Nitrite, UA: NEGATIVE
RBC UA: NEGATIVE
SPEC GRAV UA: 1.015
UROBILINOGEN UA: NEGATIVE
pH, UA: 5

## 2016-07-14 NOTE — Progress Notes (Signed)
  Subjective:    Terry [name] is a 20 y.o. female being seen today for her obstetrical visit. She is at 937w2d gestation. Patient reports: no complaints and states that she feels faint at work sometimes, is not eating but one time a day.  Denies any hx of anorexia or bulemia.  encouraged small frequent meals, she likes french fries.  Denies any fear of gaining weight.  States that she is taking her prenatal vitamins.  Declines counseling at this time.  FOB was present for the exam.  .  Problem List Items Addressed This Visit    None    Visit Diagnoses    Encounter for supervision of normal first pregnancy in second trimester    -  Primary   Relevant Orders   POCT urinalysis dipstick (Completed)   US OB Comp + 14 Wk     Patient Active Problem List   Diagnosis Date Noted  . Supervision of normal first pregnancy in first trimester 05/19/2016  . Pelvic pain in female 07/28/2015    Objective:     BP 114/79   Pulse (!) 105   Temp 98.9 F (37.2 C)   Wt 128 lb 6.4 oz (58.2 kg)   LMP 03/10/2016 (Exact Date)   BMI 18.42 kg/m  Uterine Size: Below umbilicus     Assessment:    Pregnancy @ 5337w2d  weeks Doing well    Plan:    Problem list reviewed and updated. Labs reviewed.  Follow up in 4 weeks. FIRST/CF mutation testing/NIPT/QUAD SCREEN/fragile X/Ashkenazi Jewish population testing/Spinal muscular atrophy discussed: results reviewed. Role of ultrasound in pregnancy discussed; fetal survey: ordered. Amniocentesis discussed: not indicated. 50% of 20 minute visit spent on counseling and coordination of care.

## 2016-07-14 NOTE — Progress Notes (Signed)
Patient states she is not good at eating- encouraged patient to graze during the day.

## 2016-07-27 ENCOUNTER — Inpatient Hospital Stay (HOSPITAL_COMMUNITY)
Admission: AD | Admit: 2016-07-27 | Discharge: 2016-07-27 | Disposition: A | Discharge status: Home / Self Care | Payer: Medicaid Other | Source: Ambulatory Visit | Attending: Obstetrics and Gynecology | Admitting: Obstetrics and Gynecology

## 2016-07-27 ENCOUNTER — Encounter (HOSPITAL_COMMUNITY): Payer: Self-pay | Admitting: *Deleted

## 2016-07-27 DIAGNOSIS — O98812 Other maternal infectious and parasitic diseases complicating pregnancy, second trimester: Secondary | ICD-10-CM | POA: Diagnosis not present

## 2016-07-27 DIAGNOSIS — Z3A18 18 weeks gestation of pregnancy: Secondary | ICD-10-CM | POA: Insufficient documentation

## 2016-07-27 DIAGNOSIS — B373 Candidiasis of vulva and vagina: Secondary | ICD-10-CM | POA: Insufficient documentation

## 2016-07-27 DIAGNOSIS — N898 Other specified noninflammatory disorders of vagina: Secondary | ICD-10-CM | POA: Diagnosis present

## 2016-07-27 DIAGNOSIS — B3731 Acute candidiasis of vulva and vagina: Secondary | ICD-10-CM

## 2016-07-27 LAB — URINALYSIS, ROUTINE W REFLEX MICROSCOPIC
Bilirubin Urine: NEGATIVE
Glucose, UA: NEGATIVE mg/dL
Hgb urine dipstick: NEGATIVE
Ketones, ur: NEGATIVE mg/dL
LEUKOCYTES UA: NEGATIVE
NITRITE: NEGATIVE
Protein, ur: NEGATIVE mg/dL
SPECIFIC GRAVITY, URINE: 1.015 (ref 1.005–1.030)
pH: 7.5 (ref 5.0–8.0)

## 2016-07-27 LAB — WET PREP, GENITAL
Clue Cells Wet Prep HPF POC: NONE SEEN
Sperm: NONE SEEN
Trich, Wet Prep: NONE SEEN
Yeast Wet Prep HPF POC: NONE SEEN

## 2016-07-27 MED ORDER — TERCONAZOLE 0.4 % VA CREA: 1.0000 | TOPICAL_CREAM | Freq: Every day | VAGINAL | 0 refills | Status: DC

## 2016-07-27 MED ORDER — FLUCONAZOLE 150 MG PO TABS: 150.0000 mg | ORAL_TABLET | Freq: Once | ORAL | 0 refills | Status: AC

## 2016-07-27 MED ORDER — NYSTATIN-TRIAMCINOLONE 100000-0.1 UNIT/GM-% EX CREA
TOPICAL_CREAM | Freq: Once | CUTANEOUS | Status: AC
  Administered 2016-07-27: 15:00:00 via TOPICAL
  Filled 2016-07-27: qty 15

## 2016-07-27 NOTE — Discharge Instructions (Signed)
Monilial Vaginitis Vaginitis in a soreness, swelling and redness (inflammation) of the vagina and vulva. Monilial vaginitis is not a sexually transmitted infection. CAUSES  Yeast vaginitis is caused by yeast (candida) that is normally found in your vagina. With a yeast infection, the candida has overgrown in number to a point that upsets the chemical balance. SYMPTOMS   White, thick vaginal discharge.  Swelling, itching, redness and irritation of the vagina and possibly the lips of the vagina (vulva).  Burning or painful urination.  Painful intercourse. DIAGNOSIS  Things that may contribute to monilial vaginitis are:  Postmenopausal and virginal states.  Pregnancy.  Infections.  Being tired, sick or stressed, especially if you had monilial vaginitis in the past.  Diabetes. Good control will help lower the chance.  Birth control pills.  Tight fitting garments.  Using bubble bath, feminine sprays, douches or deodorant tampons.  Taking certain medications that kill germs (antibiotics).  Sporadic recurrence can occur if you become ill. TREATMENT  Your caregiver will give you medication.  There are several kinds of anti monilial vaginal creams and suppositories specific for monilial vaginitis. For recurrent yeast infections, use a suppository or cream in the vagina 2 times a week, or as directed.  Anti-monilial or steroid cream for the itching or irritation of the vulva may also be used. Get your caregiver's permission.  Painting the vagina with methylene blue solution may help if the monilial cream does not work.  Eating yogurt may help prevent monilial vaginitis. HOME CARE INSTRUCTIONS   Finish all medication as prescribed.  Do not have sex until treatment is completed or after your caregiver tells you it is okay.  Take warm sitz baths.  Do not douche.  Do not use tampons, especially scented ones.  Wear cotton underwear.  Avoid tight pants and panty  hose.  Tell your sexual partner that you have a yeast infection. They should go to their caregiver if they have symptoms such as mild rash or itching.  Your sexual partner should be treated as well if your infection is difficult to eliminate.  Practice safer sex. Use condoms.  Some vaginal medications cause latex condoms to fail. Vaginal medications that harm condoms are:  Cleocin cream.  Butoconazole (Femstat).  Terconazole (Terazol) vaginal suppository.  Miconazole (Monistat) (may be purchased over the counter). SEEK MEDICAL CARE IF:   You have a temperature by mouth above 102 F (38.9 C).  The infection is getting worse after 2 days of treatment.  The infection is not getting better after 3 days of treatment.  You develop blisters in or around your vagina.  You develop vaginal bleeding, and it is not your menstrual period.  You have pain when you urinate.  You develop intestinal problems.  You have pain with sexual intercourse.   This information is not intended to replace advice given to you by your health care provider. Make sure you discuss any questions you have with your health care provider.   Document Released: 08/04/2005 Document Revised: 01/17/2012 Document Reviewed: 04/28/2015 Elsevier Interactive Patient Education 2016 [employer]   Vaginitis Vaginitis is an inflammation of the vagina. It can happen when the normal bacteria and yeast in the vagina grow too much. There are different types. Treatment will depend on the type you have. HOME CARE  Take all medicines as told by your doctor.  Keep your vagina area clean and dry. Avoid soap. Rinse the area with water.  Avoid washing and cleaning out the vagina (douching).  Do not use tampons or have sex (intercourse) until your treatment is done.  Wipe from front to back after going to the restroom.  Wear cotton underwear.  Avoid wearing underwear while you sleep until your vaginitis is  gone.  Avoid tight pants. Avoid underwear or nylons without a cotton panel.  Take off wet clothing (such as a bathing suit) as soon as you can.  Use mild, unscented products. Avoid fabric softeners and scented:  Feminine sprays.  Laundry detergents.  Tampons.  Soaps or bubble baths.  Practice safe sex and use condoms. GET HELP RIGHT AWAY IF:   You have belly (abdominal) pain.  You have a fever or lasting symptoms for more than 2-3 days.  You have a fever and your symptoms suddenly get worse. MAKE SURE YOU:   Understand these instructions.  Will watch this condition.  Will get help right away if you are not doing well or get worse.   This information is not intended to replace advice given to you by your health care provider. Make sure you discuss any questions you have with your health care provider.   Document Released: 01/21/2009 Document Revised: 07/19/2012 Document Reviewed: 04/06/2012 Elsevier Interactive Patient Education 2016 [employer]. Vaginitis Vaginitis is an inflammation of the vagina. It can happen when the normal bacteria and yeast in the vagina grow too much. There are different types. Treatment will depend on the type you have. HOME CARE  Take all medicines as told by your doctor.  Keep your vagina area clean and dry. Avoid soap. Rinse the area with water.  Avoid washing and cleaning out the vagina (douching).  Do not use tampons or have sex (intercourse) until your treatment is done.  Wipe from front to back after going to the restroom.  Wear cotton underwear.  Avoid wearing underwear while you sleep until your vaginitis is gone.  Avoid tight pants. Avoid underwear or nylons without a cotton panel.  Take off wet clothing (such as a bathing suit) as soon as you can.  Use mild, unscented products. Avoid fabric softeners and scented:  Feminine sprays.  Laundry detergents.  Tampons.  Soaps or bubble baths.  Practice safe sex and use  condoms. GET HELP RIGHT AWAY IF:   You have belly (abdominal) pain.  You have a fever or lasting symptoms for more than 2-3 days.  You have a fever and your symptoms suddenly get worse. MAKE SURE YOU:   Understand these instructions.  Will watch this condition.  Will get help right away if you are not doing well or get worse.   This information is not intended to replace advice given to you by your health care provider. Make sure you discuss any questions you have with your health care provider.   Document Released: 01/21/2009 Document Revised: 07/19/2012 Document Reviewed: 04/06/2012 Elsevier Interactive Patient Education [employer]

## 2016-07-27 NOTE — MAU Note (Addendum)
Pt was seen in MAU within the last month, thought she had a yeast infection, finished prescribed medication.  Itching & burning has resumed, pt took monistat OTC, has not resolved.  Denies bleeding, has white thick discharge.  Denies abd pain.  Has some burning with urination.

## 2016-07-27 NOTE — Progress Notes (Signed)
Terry [name] [name] NP in earlier to discuss test results and d/c plan. Written and verbal d/c instructions given and understanding voiced.

## 2016-07-27 NOTE — MAU Provider Note (Signed)
History     CSN: [id number]  Arrival date and time: 07/27/16 1129   None     Chief Complaint  Patient presents with  . Vaginal Discharge  . Dysuria   HPIpt is 20 y.o.6681w1d [facility] white vaginal discharge, like mashed potatoes.  Pt has given Rx cream (Terazole)without relief and also used Monistat OTC without relief.  Pt wears pad due to increase amount of vaginal discharge.  Pt had increase sx after IC. Was seen 07/02/2016 with neg wet prep and neg GC/Chlamydia.   RN note: Pt was seen in MAU within the last month, thought she had a yeast infection, finished prescribed medication.  Itching & burning has resumed, pt took monistat OTC, has not resolved.  Denies bleeding, has white thick discharge.  Denies abd pain.  Has some burning with urination.  Past Medical History:  Diagnosis Date  . Chlamydia   . Gonorrhea     Past Surgical History:  Procedure Laterality Date  . NO PAST SURGERIES      Family History  Problem Relation Age of Onset  . Cancer Maternal Grandmother     pancreatic  . Diabetes Maternal Uncle   . Diabetes Mother   . Hypertension Mother     Social History  Substance Use Topics  . Smoking status: Never Smoker  . Smokeless tobacco: Never Used  . Alcohol use No    Allergies: No Known Allergies  Prescriptions Prior to Admission  Medication Sig Dispense Refill Last Dose  . Prenat-FeAsp-Meth-FA-DHA w/o A (PRENATE PIXIE) 10-0.6-0.4-200 MG CAPS Take 1 tablet by mouth daily. 30 capsule 12 07/27/2016 at Unknown time  . acetaminophen (TYLENOL) 500 MG tablet Take 1 tablet (500 mg total) by mouth every 6 (six) hours as needed for mild pain or moderate pain. (Patient not taking: Reported on 07/02/2016) 30 tablet 0 prn  . Doxylamine-Pyridoxine 10-10 MG TBEC Take 2 tablets by mouth at bedtime as needed. (Patient not taking: Reported on 07/02/2016) 100 tablet 1 Not Taking  . hydrocortisone cream 1 % Apply to affected area 2 times daily (Patient  not taking: Reported on 07/02/2016) 15 g 0 Not Taking    Review of Systems  Constitutional: Negative for chills and fever.  Gastrointestinal: Negative for abdominal pain, constipation, diarrhea, nausea and vomiting.  Genitourinary: Positive for dysuria.   Physical Exam   Blood pressure 100/55, pulse 104, temperature 98.6 F (37 C), temperature source Oral, resp. rate 18, last menstrual period 03/10/2016.  Physical Exam  Nursing note and vitals reviewed. Constitutional: She is oriented to person, place, and time. She appears well-developed and well-nourished. No distress.  HENT:  Head: Normocephalic.  Eyes: Pupils are equal, round, and reactive to light.  Neck: Normal range of motion. Neck supple.  Cardiovascular: Normal rate.   Respiratory: Effort normal.  GI: Soft.  Genitourinary:  Genitourinary Comments: Clitoral hood slightly reddened and irritated Large amount of creamy white discharge in vault; cervix clean, closed  Musculoskeletal: Normal range of motion.  Neurological: She is alert and oriented to person, place, and time.  Skin: Skin is warm and dry.  Psychiatric: She has a normal mood and affect.    MAU Course  Procedures Wet prep- few WBC, yeast, clue cells, sperm, none seen with many bacteria (lab down) Mycolog cream to apply externally BID prn- given in MAU- one application in MAU with sx improvement +FHR 143 with doppler Assessment and Plan  Yeast vulvovaginitis- Diflucan Rx #1 if Mycolog does not  help; also another Rx for Terazole Viable 18 week fetus Keep scheduled appointment with [facility] 07/27/2016, 1:42 PM

## 2016-08-05 ENCOUNTER — Ambulatory Visit (INDEPENDENT_AMBULATORY_CARE_PROVIDER_SITE_OTHER): Payer: Medicaid Other

## 2016-08-05 DIAGNOSIS — Z36 Encounter for antenatal screening of mother: Secondary | ICD-10-CM

## 2016-08-05 DIAGNOSIS — Z3402 Encounter for supervision of normal first pregnancy, second trimester: Secondary | ICD-10-CM

## 2016-08-11 ENCOUNTER — Ambulatory Visit (INDEPENDENT_AMBULATORY_CARE_PROVIDER_SITE_OTHER): Payer: Medicaid Other | Admitting: Certified Nurse Midwife

## 2016-08-11 DIAGNOSIS — Z23 Encounter for immunization: Secondary | ICD-10-CM

## 2016-08-11 DIAGNOSIS — Z3402 Encounter for supervision of normal first pregnancy, second trimester: Secondary | ICD-10-CM

## 2016-08-11 NOTE — Progress Notes (Signed)
Patient has a concern that she is not eating enough due to decrease in appetite.

## 2016-08-11 NOTE — Progress Notes (Signed)
Subjective:    Terry [name] is a 20 y.o. female being seen today for her obstetrical visit. She is at 6669w2d gestation. Patient reports: no complaints . Fetal movement: normal.  Problem List Items Addressed This Visit      Other   Supervision of normal first pregnancy    Other Visit Diagnoses   None.    Patient Active Problem List   Diagnosis Date Noted  . Supervision of normal first pregnancy 05/19/2016  . Pelvic pain in female 07/28/2015   Objective:    BP 95/65   Pulse 82   Temp 98.2 F (36.8 C)   Wt 133 lb 3.2 oz (60.4 kg)   LMP 03/10/2016 (Exact Date)   BMI 19.11 kg/m  FHT: 140 BPM  Uterine Size: 20 cm and size equals dates     Assessment:    Pregnancy @ 8469w2d    Doing well  Influenza vaccine today  Plan:    OBGCT: discussed. Signs and symptoms of preterm labor: discussed.  Labs, problem list reviewed and updated 2 hr GTT planned Follow up in 4 weeks.

## 2016-08-11 NOTE — Patient Instructions (Addendum)
Before [facility] Before your baby arrives it is important to:  Have all of the supplies that you will need to care for your baby.  Know where to go if there is an emergency.  Discuss the baby's arrival with other family members. WHAT SUPPLIES WILL I NEED? It is recommended that you have the following supplies: Large Items  Crib.  Crib mattress.  Rear-facing infant car seat. If possible, have a trained professional check to make sure that it is installed correctly. Feeding  6-8 bottles that are 4-5 oz in size.  6-8 nipples.  Bottle brush.  Sterilizer, or a large pan or kettle with a lid.  A way to boil and cool water.  If you will be breastfeeding:  Breast pump.  Nipple cream.  Nursing bra.  Breast pads.  Breast shields.  If you will be formula feeding:  Formula.  Measuring cups.  Measuring spoons. Bathing  Mild baby soap and baby shampoo.  Petroleum jelly.  Soft cloth towel and washcloth.  Hooded towel.  Cotton balls.  Bath basin. Other Supplies  Rectal thermometer.  Bulb syringe.  Baby wipes or washcloths for diaper changes.  Diaper bag.  Changing pad.  Clothing, including one-piece outfits and pajamas.  Baby nail clippers.  Receiving blankets.  Mattress pad and sheets for the crib.  Night-light for the baby's room.  Baby monitor.  2 or 3 pacifiers.  Either 24-36 cloth diapers and waterproof diaper covers or a box of disposable diapers. You may need to use as many as 10-12 diapers per day. HOW DO I PREPARE FOR AN EMERGENCY? Prepare for an emergency by:  Knowing how to get to the nearest hospital.  Listing the phone numbers of your baby's health care providers near your home phone and in your cell phone. HOW DO I PREPARE MY FAMILY?  Decide how to handle visitors.  If you have other children:  Talk with them about the baby coming home. Ask them how they feel about it.  Read a book together about being a new big  brother or sister.  Find ways to let them help you prepare for the new baby.  Have someone ready to care for them while you are in the hospital.   This information is not intended to replace advice given to you by your health care provider. Make sure you discuss any questions you have with your health care provider.   Document Released: 10/07/2008 Document Revised: 03/11/2015 Document Reviewed: 10/02/2014 Elsevier Interactive Patient Education 2016 [employer]. Second Trimester of Pregnancy The second trimester is from week 13 through week 28, months 4 through 6. The second trimester is often a time when you feel your best. Your body has also adjusted to being pregnant, and you begin to feel better physically. Usually, morning sickness has lessened or quit completely, you may have more energy, and you may have an increase in appetite. The second trimester is also a time when the fetus is growing rapidly. At the end of the sixth month, the fetus is about 9 inches long and weighs about 1 pounds. You will likely begin to feel the baby move (quickening) between 18 and 20 weeks of the pregnancy. BODY CHANGES Your body goes through many changes during pregnancy. The changes vary from woman to woman.   Your weight will continue to increase. You will notice your lower abdomen bulging out.  You may begin to get stretch marks on your hips, abdomen, and breasts.  You may  develop headaches that can be relieved by medicines approved by your health care provider.  You may urinate more often because the fetus is pressing on your bladder.  You may develop or continue to have heartburn as a result of your pregnancy.  You may develop constipation because certain hormones are causing the muscles that push waste through your intestines to slow down.  You may develop hemorrhoids or swollen, bulging veins (varicose veins).  You may have back pain because of the weight gain and pregnancy hormones relaxing  your joints between the bones in your pelvis and as a result of a shift in weight and the muscles that support your balance.  Your breasts will continue to grow and be tender.  Your gums may bleed and may be sensitive to brushing and flossing.  Dark spots or blotches (chloasma, mask of pregnancy) may develop on your face. This will likely fade after the baby is born.  A dark line from your belly button to the pubic area (linea nigra) may appear. This will likely fade after the baby is born.  You may have changes in your hair. These can include thickening of your hair, rapid growth, and changes in texture. Some women also have hair loss during or after pregnancy, or hair that feels dry or thin. Your hair will most likely return to normal after your baby is born. WHAT TO EXPECT AT YOUR PRENATAL VISITS During a routine prenatal visit:  You will be weighed to make sure you and the fetus are growing normally.  Your blood pressure will be taken.  Your abdomen will be measured to track your baby's growth.  The fetal heartbeat will be listened to.  Any test results from the previous visit will be discussed. Your health care provider may ask you:  How you are feeling.  If you are feeling the baby move.  If you have had any abnormal symptoms, such as leaking fluid, bleeding, severe headaches, or abdominal cramping.  If you are using any tobacco products, including cigarettes, chewing tobacco, and electronic cigarettes.  If you have any questions. Other tests that may be performed during your second trimester include:  Blood tests that check for:  Low iron levels (anemia).  Gestational diabetes (between 24 and 28 weeks).  Rh antibodies.  Urine tests to check for infections, diabetes, or protein in the urine.  An ultrasound to confirm the proper growth and development of the baby.  An amniocentesis to check for possible genetic problems.  Fetal screens for spina bifida and Down  syndrome.  HIV (human immunodeficiency virus) testing. Routine prenatal testing includes screening for HIV, unless you choose not to have this test. HOME CARE INSTRUCTIONS   Avoid all smoking, herbs, alcohol, and unprescribed drugs. These chemicals affect the formation and growth of the baby.  Do not use any tobacco products, including cigarettes, chewing tobacco, and electronic cigarettes. If you need help quitting, ask your health care provider. You may receive counseling support and other resources to help you quit.  Follow your health care provider's instructions regarding medicine use. There are medicines that are either safe or unsafe to take during pregnancy.  Exercise only as directed by your health care provider. Experiencing uterine cramps is a good sign to stop exercising.  Continue to eat regular, healthy meals.  Wear a good support bra for breast tenderness.  Do not use hot tubs, steam rooms, or saunas.  Wear your seat belt at all times when driving.  Avoid raw  meat, uncooked cheese, cat litter boxes, and soil used by cats. These carry germs that can cause birth defects in the baby.  Take your prenatal vitamins.  Take 1500-2000 mg of calcium daily starting at the 20th week of pregnancy until you deliver your baby.  Try taking a stool softener (if your health care provider approves) if you develop constipation. Eat more high-fiber foods, such as fresh vegetables or fruit and whole grains. Drink plenty of fluids to keep your urine clear or pale yellow.  Take warm sitz baths to soothe any pain or discomfort caused by hemorrhoids. Use hemorrhoid cream if your health care provider approves.  If you develop varicose veins, wear support hose. Elevate your feet for 15 minutes, 3-4 times a day. Limit salt in your diet.  Avoid heavy lifting, wear low heel shoes, and practice good posture.  Rest with your legs elevated if you have leg cramps or low back pain.  Visit your  dentist if you have not gone yet during your pregnancy. Use a soft toothbrush to brush your teeth and be gentle when you floss.  A sexual relationship may be continued unless your health care provider directs you otherwise.  Continue to go to all your prenatal visits as directed by your health care provider. SEEK MEDICAL CARE IF:   You have dizziness.  You have mild pelvic cramps, pelvic pressure, or nagging pain in the abdominal area.  You have persistent nausea, vomiting, or diarrhea.  You have a bad smelling vaginal discharge.  You have pain with urination. SEEK IMMEDIATE MEDICAL CARE IF:   You have a fever.  You are leaking fluid from your vagina.  You have spotting or bleeding from your vagina.  You have severe abdominal cramping or pain.  You have rapid weight gain or loss.  You have shortness of breath with chest pain.  You notice sudden or extreme swelling of your face, hands, ankles, feet, or legs.  You have not felt your baby move in over an hour.  You have severe headaches that do not go away with medicine.  You have vision changes.   This information is not intended to replace advice given to you by your health care provider. Make sure you discuss any questions you have with your health care provider.   Document Released: 10/19/2001 Document Revised: 11/15/2014 Document Reviewed: 12/26/2012 Elsevier Interactive Patient Education [employer]

## 2016-08-15 ENCOUNTER — Emergency Department (HOSPITAL_COMMUNITY)
Admission: EM | Admit: 2016-08-15 | Discharge: 2016-08-15 | Disposition: A | Discharge status: Home / Self Care | Payer: Medicaid Other | Attending: Emergency Medicine | Admitting: Emergency Medicine

## 2016-08-15 ENCOUNTER — Encounter (HOSPITAL_COMMUNITY): Payer: Self-pay | Admitting: Emergency Medicine

## 2016-08-15 DIAGNOSIS — Z3A21 21 weeks gestation of pregnancy: Secondary | ICD-10-CM | POA: Insufficient documentation

## 2016-08-15 DIAGNOSIS — O23592 Infection of other part of genital tract in pregnancy, second trimester: Secondary | ICD-10-CM | POA: Diagnosis not present

## 2016-08-15 DIAGNOSIS — N760 Acute vaginitis: Secondary | ICD-10-CM

## 2016-08-15 DIAGNOSIS — Z79899 Other long term (current) drug therapy: Secondary | ICD-10-CM | POA: Diagnosis not present

## 2016-08-15 DIAGNOSIS — B9689 Other specified bacterial agents as the cause of diseases classified elsewhere: Secondary | ICD-10-CM

## 2016-08-15 LAB — URINALYSIS, ROUTINE W REFLEX MICROSCOPIC
BILIRUBIN URINE: NEGATIVE
GLUCOSE, UA: NEGATIVE mg/dL
HGB URINE DIPSTICK: NEGATIVE
KETONES UR: NEGATIVE mg/dL
Leukocytes, UA: NEGATIVE
Nitrite: NEGATIVE
PROTEIN: NEGATIVE mg/dL
Specific Gravity, Urine: 1.009 (ref 1.005–1.030)
pH: 6.5 (ref 5.0–8.0)

## 2016-08-15 LAB — WET PREP, GENITAL
Sperm: NONE SEEN
Trich, Wet Prep: NONE SEEN
Yeast Wet Prep HPF POC: NONE SEEN

## 2016-08-15 MED ORDER — METRONIDAZOLE 500 MG PO TABS: 500.0000 mg | ORAL_TABLET | Freq: Two times a day (BID) | ORAL | 0 refills | Status: DC

## 2016-08-15 NOTE — ED Provider Notes (Signed)
[facility]-EMERGENCY DEPT Provider Note   CSN: [id number] Arrival date & time: 08/15/16  1121     History   Chief Complaint Chief Complaint  Patient presents with  . Vaginal Discharge    pt is pregnant    HPI Terry [name] is a 20 y.o. female.  Patient is a 20 year old female with no pertinent past medical history presents the ED with complaint of vaginal discharge, onset this morning. Patient states she is [redacted] weeks pregnant, LMP 5/30. Patient reports when she woke up this morning and went to use the restroom she noticed she had green mucoid vaginal discharge. Denies fever, abdominal pain, nausea, vomiting, dysuria, hematuria, vaginal bleeding. She notes she is followed by OB/GYN and her most recent appointment was last week. Denies any compensation straining her current pregnancy. G1P0A0. Denies history of abdominal surgeries. Patient reports she has not been sexually active since found out she was pregnant. Denies any known exposure to STDs.      Past Medical History:  Diagnosis Date  . Chlamydia   . Gonorrhea     Patient Active Problem List   Diagnosis Date Noted  . Supervision of normal first pregnancy 05/19/2016  . Pelvic pain in female 07/28/2015    Past Surgical History:  Procedure Laterality Date  . NO PAST SURGERIES      OB History    Gravida Para Term Preterm AB Living   1       0 0   SAB TAB Ectopic Multiple Live Births   0               Home Medications    Prior to Admission medications   Medication Sig Start Date End Date Taking? Authorizing Provider  Prenat-FeAsp-Meth-FA-DHA w/o A (PRENATE PIXIE) 10-0.6-0.4-200 MG CAPS Take 1 tablet by mouth daily. 05/19/16  Yes Rachelle A Denney, CNM  acetaminophen (TYLENOL) 500 MG tablet Take 1 tablet (500 mg total) by mouth every 6 (six) hours as needed for mild pain or moderate pain. Patient not taking: Reported on 07/02/2016 05/04/16   Antony MaduraKelly Humes, PA-C  Doxylamine-Pyridoxine 10-10 MG TBEC Take 2 tablets by mouth  at bedtime as needed. Patient not taking: Reported on 07/02/2016 06/06/16   Aviva SignsMarie L Williams, CNM  hydrocortisone cream 1 % Apply to affected area 2 times daily Patient not taking: Reported on 07/02/2016 07/02/16   Tharon AquasFrank C Patrick, PA  metroNIDAZOLE (FLAGYL) 500 MG tablet Take 1 tablet (500 mg total) by mouth 2 (two) times daily. 08/15/16   Barrett HenleNicole Elizabeth Nadeau, PA-C  terconazole (TERAZOL 7) 0.4 % vaginal cream Place 1 applicator vaginally at bedtime. Patient not taking: Reported on 08/15/2016 07/27/16   Jean RosenthalSusan P Lineberry, NP    Family History Family History  Problem Relation Age of Onset  . Cancer Maternal Grandmother     pancreatic  . Diabetes Maternal Uncle   . Diabetes Mother   . Hypertension Mother     Social History Social History  Substance Use Topics  . Smoking status: Never Smoker  . Smokeless tobacco: Never Used  . Alcohol use No     Allergies   Review of patient's allergies indicates no known allergies.   Review of Systems Review of Systems  Genitourinary: Positive for vaginal discharge.  All other systems reviewed and are negative.    Physical Exam Updated Vital Signs BP (!) 103/54 (BP Location: Left Arm)   Pulse 79   Temp 97.8 F (36.6 C) (Oral)   Resp 14   Ht 5'  10" (1.778 m)   Wt 60.3 kg   LMP 03/10/2016 (Exact Date)   SpO2 97%   BMI 19.08 kg/m   Physical Exam  Constitutional: She is oriented to person, place, and time. She appears well-developed and well-nourished.  HENT:  Head: Normocephalic and atraumatic.  Mouth/Throat: Oropharynx is clear and moist. No oropharyngeal exudate.  Eyes: Conjunctivae and EOM are normal. Right eye exhibits no discharge. Left eye exhibits no discharge. No scleral icterus.  Neck: Normal range of motion. Neck supple.  Cardiovascular: Normal rate, regular rhythm, normal heart sounds and intact distal pulses.   Pulmonary/Chest: Effort normal and breath sounds normal. No respiratory distress. She has no wheezes. She has  no rales. She exhibits no tenderness.  Abdominal: Soft. Bowel sounds are normal. She exhibits no mass. There is no tenderness. There is no rebound and no guarding.  Fundal height palpated just inferior to umbilicus.  Musculoskeletal: Normal range of motion. She exhibits no edema.  Neurological: She is alert and oriented to person, place, and time.  Skin: Skin is warm and dry.  Nursing note and vitals reviewed.  Pelvic exam: normal external genitalia, vulva, vagina, cervix, uterus and adnexa, VULVA: normal appearing vulva with no masses, tenderness or lesions, VAGINA: normal appearing vagina with normal color and discharge, no lesions, vaginal discharge - white and mucoid, CERVIX: normal appearing cervix without discharge or lesions, UTERUS: uterus is normal size, shape, consistency and nontender, ADNEXA: normal adnexa in size, nontender and no masses, exam chaperoned by female tech.   ED Treatments / Results  Labs (all labs ordered are listed, but only abnormal results are displayed) Labs Reviewed  WET PREP, GENITAL - Abnormal; Notable for the following:       Result Value   Clue Cells Wet Prep HPF POC PRESENT (*)    WBC, Wet Prep HPF POC MANY (*)    All other components within normal limits  URINALYSIS, ROUTINE W REFLEX MICROSCOPIC (NOT AT [facility])  RPR  HIV ANTIBODY (ROUTINE TESTING)  GC/CHLAMYDIA PROBE AMP ([place]) NOT AT [facility]    EKG  EKG Interpretation None       Radiology No results found.  Procedures Procedures (including critical care time)  Medications Ordered in ED Medications - No data to display   Initial Impression / Assessment and Plan / ED Course  I have reviewed the triage vital signs and the nursing notes.  Pertinent labs & imaging results that were available during my care of the patient were reviewed by me and considered in my medical decision making (see chart for details).  Clinical Course    Patient presents with vaginal discharge that started  this morning. She is [redacted] weeks pregnant, G1P0A0. Denies any other symptoms. VSS. Exam unremarkable. Pelvic exam revealed white discharge in vaginal vault, no CMT or adnexal tenderness. UA unremarkable. Wet prep positive for clue cells and wbc's, no evidence of ferning reported. Suspect patient's symptoms are likely due to to BV. Labs pending for gonorrhea/chlamydia, HIV and RPR. Discussed results and pending labs with patient. Plan to discharge patient home with Flagyl and follow up with OB/GYN. Discussed return precautions.  Final Clinical Impressions(s) / ED Diagnoses   Final diagnoses:  BV (bacterial vaginosis)    New Prescriptions New Prescriptions   METRONIDAZOLE (FLAGYL) 500 MG TABLET    Take 1 tablet (500 mg total) by mouth 2 (two) times daily.     Satira Sark [place], [place] 08/15/16 1401    Canary Brim Tegeler, MD 08/15/16 Ebony Cargo

## 2016-08-15 NOTE — Discharge Instructions (Signed)
Take your antibiotic as prescribed until completed. I recommend following up with your OB/GYN in the next week if her symptoms have not improved, otherwise follow-up at her scheduled appointment on 10/30. Please return to the Emergency Department if symptoms worsen or new onset of fever, abdominal pain, nausea, vomiting, unable to keep fluids down, vaginal bleeding, vaginal discharge.

## 2016-08-15 NOTE — ED Notes (Signed)
PT DISCHARGED. INSTRUCTIONS AND PRESCRIPTION GIVEN. AAOX4. PT IN NO APPARENT DISTRESS OR PAIN. THE OPPORTUNITY TO ASK QUESTIONS WAS PROVIDED. 

## 2016-08-15 NOTE — ED Triage Notes (Signed)
Pt reports seeing and feeling mucus from vaginal area. Noted at 0900. Pt denies cramping or bleeding. Pt stated that she is [redacted] weeks pregnant

## 2016-08-15 NOTE — ED Notes (Signed)
Pt is set up for a pelvic exam and has provided a urine sample.

## 2016-08-16 LAB — RPR: RPR: NONREACTIVE

## 2016-08-16 LAB — GC/CHLAMYDIA PROBE AMP (~~LOC~~) NOT AT ARMC
Chlamydia: NEGATIVE
NEISSERIA GONORRHEA: NEGATIVE

## 2016-08-16 LAB — HIV ANTIBODY (ROUTINE TESTING W REFLEX): HIV SCREEN 4TH GENERATION: NONREACTIVE

## 2016-08-28 ENCOUNTER — Encounter: Payer: Self-pay | Admitting: Certified Nurse Midwife

## 2016-08-30 NOTE — Telephone Encounter (Signed)
Please review

## 2016-09-06 ENCOUNTER — Ambulatory Visit (INDEPENDENT_AMBULATORY_CARE_PROVIDER_SITE_OTHER): Payer: Medicaid Other | Admitting: Certified Nurse Midwife

## 2016-09-06 VITALS — BP 123/85 | HR 121 | Temp 98.9°F | Wt 140.0 lb

## 2016-09-06 DIAGNOSIS — Z3402 Encounter for supervision of normal first pregnancy, second trimester: Secondary | ICD-10-CM

## 2016-09-06 NOTE — Patient Instructions (Signed)
Second Trimester of Pregnancy The second trimester is from week 13 through week 28, months 4 through 6. The second trimester is often a time when you feel your best. Your body has also adjusted to being pregnant, and you begin to feel better physically. Usually, morning sickness has lessened or quit completely, you may have more energy, and you may have an increase in appetite. The second trimester is also a time when the fetus is growing rapidly. At the end of the sixth month, the fetus is about 9 inches long and weighs about 1 pounds. You will likely begin to feel the baby move (quickening) between 18 and 20 weeks of the pregnancy. BODY CHANGES Your body goes through many changes during pregnancy. The changes vary from woman to woman.   Your weight will continue to increase. You will notice your lower abdomen bulging out.  You may begin to get stretch marks on your hips, abdomen, and breasts.  You may develop headaches that can be relieved by medicines approved by your health care provider.  You may urinate more often because the fetus is pressing on your bladder.  You may develop or continue to have heartburn as a result of your pregnancy.  You may develop constipation because certain hormones are causing the muscles that push waste through your intestines to slow down.  You may develop hemorrhoids or swollen, bulging veins (varicose veins).  You may have back pain because of the weight gain and pregnancy hormones relaxing your joints between the bones in your pelvis and as a result of a shift in weight and the muscles that support your balance.  Your breasts will continue to grow and be tender.  Your gums may bleed and may be sensitive to brushing and flossing.  Dark spots or blotches (chloasma, mask of pregnancy) may develop on your face. This will likely fade after the baby is born.  A dark line from your belly button to the pubic area (linea nigra) may appear. This will likely fade  after the baby is born.  You may have changes in your hair. These can include thickening of your hair, rapid growth, and changes in texture. Some women also have hair loss during or after pregnancy, or hair that feels dry or thin. Your hair will most likely return to normal after your baby is born. WHAT TO EXPECT AT YOUR PRENATAL VISITS During a routine prenatal visit:  You will be weighed to make sure you and the fetus are growing normally.  Your blood pressure will be taken.  Your abdomen will be measured to track your baby's growth.  The fetal heartbeat will be listened to.  Any test results from the previous visit will be discussed. Your health care provider may ask you:  How you are feeling.  If you are feeling the baby move.  If you have had any abnormal symptoms, such as leaking fluid, bleeding, severe headaches, or abdominal cramping.  If you are using any tobacco products, including cigarettes, chewing tobacco, and electronic cigarettes.  If you have any questions. Other tests that may be performed during your second trimester include:  Blood tests that check for:  Low iron levels (anemia).  Gestational diabetes (between 24 and 28 weeks).  Rh antibodies.  Urine tests to check for infections, diabetes, or protein in the urine.  An ultrasound to confirm the proper growth and development of the baby.  An amniocentesis to check for possible genetic problems.  Fetal screens for spina bifida   and Down syndrome.  HIV (human immunodeficiency virus) testing. Routine prenatal testing includes screening for HIV, unless you choose not to have this test. HOME CARE INSTRUCTIONS   Avoid all smoking, herbs, alcohol, and unprescribed drugs. These chemicals affect the formation and growth of the baby.  Do not use any tobacco products, including cigarettes, chewing tobacco, and electronic cigarettes. If you need help quitting, ask your health care provider. You may receive  counseling support and other resources to help you quit.  Follow your health care provider's instructions regarding medicine use. There are medicines that are either safe or unsafe to take during pregnancy.  Exercise only as directed by your health care provider. Experiencing uterine cramps is a good sign to stop exercising.  Continue to eat regular, healthy meals.  Wear a good support bra for breast tenderness.  Do not use hot tubs, steam rooms, or saunas.  Wear your seat belt at all times when driving.  Avoid raw meat, uncooked cheese, cat litter boxes, and soil used by cats. These carry germs that can cause birth defects in the baby.  Take your prenatal vitamins.  Take 1500-2000 mg of calcium daily starting at the 20th week of pregnancy until you deliver your baby.  Try taking a stool softener (if your health care provider approves) if you develop constipation. Eat more high-fiber foods, such as fresh vegetables or fruit and whole grains. Drink plenty of fluids to keep your urine clear or pale yellow.  Take warm sitz baths to soothe any pain or discomfort caused by hemorrhoids. Use hemorrhoid cream if your health care provider approves.  If you develop varicose veins, wear support hose. Elevate your feet for 15 minutes, 3-4 times a day. Limit salt in your diet.  Avoid heavy lifting, wear low heel shoes, and practice good posture.  Rest with your legs elevated if you have leg cramps or low back pain.  Visit your dentist if you have not gone yet during your pregnancy. Use a soft toothbrush to brush your teeth and be gentle when you floss.  A sexual relationship may be continued unless your health care provider directs you otherwise.  Continue to go to all your prenatal visits as directed by your health care provider. SEEK MEDICAL CARE IF:   You have dizziness.  You have mild pelvic cramps, pelvic pressure, or nagging pain in the abdominal area.  You have persistent nausea,  vomiting, or diarrhea.  You have a bad smelling vaginal discharge.  You have pain with urination. SEEK IMMEDIATE MEDICAL CARE IF:   You have a fever.  You are leaking fluid from your vagina.  You have spotting or bleeding from your vagina.  You have severe abdominal cramping or pain.  You have rapid weight gain or loss.  You have shortness of breath with chest pain.  You notice sudden or extreme swelling of your face, hands, ankles, feet, or legs.  You have not felt your baby move in over an hour.  You have severe headaches that do not go away with medicine.  You have vision changes.   This information is not intended to replace advice given to you by your health care provider. Make sure you discuss any questions you have with your health care provider.   Document Released: 10/19/2001 Document Revised: 11/15/2014 Document Reviewed: 12/26/2012 Elsevier Interactive Patient Education 2016 [employer] Preterm Labor Information Preterm labor is when labor starts at less than 37 weeks of pregnancy. The normal length of a pregnancy  is 39 to 41 weeks. CAUSES Often, there is no identifiable underlying cause as to why a woman goes into preterm labor. One of the most common known causes of preterm labor is infection. Infections of the uterus, cervix, vagina, amniotic sac, bladder, kidney, or even the lungs (pneumonia) can cause labor to start. Other suspected causes of preterm labor include:   Urogenital infections, such as yeast infections and bacterial vaginosis.   Uterine abnormalities (uterine shape, uterine septum, fibroids, or bleeding from the placenta).   A cervix that has been operated on (it may fail to stay closed).   Malformations in the fetus.   Multiple gestations (twins, triplets, and so on).   Breakage of the amniotic sac.  RISK FACTORS  Having a previous history of preterm labor.   Having premature rupture of membranes (PROM).   Having a placenta  that covers the opening of the cervix (placenta previa).   Having a placenta that separates from the uterus (placental abruption).   Having a cervix that is too weak to hold the fetus in the uterus (incompetent cervix).   Having too much fluid in the amniotic sac (polyhydramnios).   Taking illegal drugs or smoking while pregnant.   Not gaining enough weight while pregnant.   Being younger than 18 and older than 22 years old.   Having a low socioeconomic status.   Being African American. SYMPTOMS Signs and symptoms of preterm labor include:   Menstrual-like cramps, abdominal pain, or back pain.  Uterine contractions that are regular, as frequent as six in an hour, regardless of their intensity (may be mild or painful).  Contractions that start on the top of the uterus and spread down to the lower abdomen and back.   A sense of increased pelvic pressure.   A watery or bloody mucus discharge that comes from the vagina.  TREATMENT Depending on the length of the pregnancy and other circumstances, your health care provider may suggest bed rest. If necessary, there are medicines that can be given to stop contractions and to mature the fetal lungs. If labor happens before 34 weeks of pregnancy, a prolonged hospital stay may be recommended. Treatment depends on the condition of both you and the fetus.  WHAT SHOULD YOU DO IF YOU THINK YOU ARE IN PRETERM LABOR? Call your health care provider right away. You will need to go to the hospital to get checked immediately. HOW CAN YOU PREVENT PRETERM LABOR IN FUTURE PREGNANCIES? You should:   Stop smoking if you smoke.  Maintain healthy weight gain and avoid chemicals and drugs that are not necessary.  Be watchful for any type of infection.  Inform your health care provider if you have a known history of preterm labor.   This information is not intended to replace advice given to you by your health care provider. Make sure you  discuss any questions you have with your health care provider.   Document Released: 01/15/2004 Document Revised: 06/27/2013 Document Reviewed: 11/27/2012 Elsevier Interactive Patient Education 2016 Elsevier Inc.  

## 2016-09-06 NOTE — Progress Notes (Signed)
Subjective:    Terry [name] is a 20 y.o. female being seen today for her obstetrical visit. She is at 5121w0d gestation. Patient reports: no complaints . Fetal movement: normal.  Problem List Items Addressed This Visit      Other   Supervision of normal first pregnancy - Primary   Relevant Orders   US OB Follow Up    Other Visit Diagnoses   None.    Patient Active Problem List   Diagnosis Date Noted  . Supervision of normal first pregnancy 05/19/2016  . Pelvic pain in female 07/28/2015   Objective:    BP 123/85   Pulse (!) 121   Temp 98.9 F (37.2 C)   Wt 140 lb (63.5 kg)   LMP 03/10/2016 (Exact Date)   BMI 20.09 kg/m  FHT: 144 BPM  Uterine Size: 24 cm and size equals dates     Assessment:    Pregnancy @ 2121w0d     Mild cervical length shortening affecting pregnancy on US: f/u US ordered  Plan:    OBGCT: discussed and ordered for next visit. Signs and symptoms of preterm labor: discussed and handout given.  Labs, problem list reviewed and updated 2 hr GTT planned Follow up in 4 weeks.

## 2016-09-16 ENCOUNTER — Other Ambulatory Visit: Payer: Self-pay | Admitting: Certified Nurse Midwife

## 2016-09-16 DIAGNOSIS — O26872 Cervical shortening, second trimester: Secondary | ICD-10-CM

## 2016-09-17 ENCOUNTER — Ambulatory Visit (INDEPENDENT_AMBULATORY_CARE_PROVIDER_SITE_OTHER): Payer: Medicaid Other

## 2016-09-17 DIAGNOSIS — O26872 Cervical shortening, second trimester: Secondary | ICD-10-CM

## 2016-09-20 ENCOUNTER — Other Ambulatory Visit: Payer: Self-pay | Admitting: Certified Nurse Midwife

## 2016-09-20 DIAGNOSIS — O26872 Cervical shortening, second trimester: Secondary | ICD-10-CM

## 2016-09-20 DIAGNOSIS — O26879 Cervical shortening, unspecified trimester: Secondary | ICD-10-CM

## 2016-09-28 ENCOUNTER — Encounter: Payer: Self-pay | Admitting: Certified Nurse Midwife

## 2016-09-29 ENCOUNTER — Other Ambulatory Visit: Payer: Self-pay | Admitting: Certified Nurse Midwife

## 2016-10-04 ENCOUNTER — Ambulatory Visit (INDEPENDENT_AMBULATORY_CARE_PROVIDER_SITE_OTHER): Payer: Medicaid Other | Admitting: Certified Nurse Midwife

## 2016-10-04 ENCOUNTER — Other Ambulatory Visit: Payer: Medicaid Other

## 2016-10-04 VITALS — BP 102/71 | HR 108 | Wt 146.0 lb

## 2016-10-04 DIAGNOSIS — O26873 Cervical shortening, third trimester: Secondary | ICD-10-CM

## 2016-10-04 DIAGNOSIS — Z3403 Encounter for supervision of normal first pregnancy, third trimester: Secondary | ICD-10-CM

## 2016-10-04 DIAGNOSIS — Z23 Encounter for immunization: Secondary | ICD-10-CM

## 2016-10-04 DIAGNOSIS — O26879 Cervical shortening, unspecified trimester: Secondary | ICD-10-CM

## 2016-10-04 NOTE — Progress Notes (Signed)
Subjective:    Terry [name] is a 20 y.o. female being seen today for her obstetrical visit. She is at 6638w0d gestation. Patient reports no bleeding, no contractions, no cramping, no leaking and hair loss, is eating well. Fetal movement: normal.  Previous US with mild cervical shortening on US.  Per Dr. Clearance CootsHarper repeat US in 2-3 weeks.  F/U US ordered.    Problem List Items Addressed This Visit      Other   Supervision of normal first pregnancy   Cervical shortening affecting pregnancy - Primary     Patient Active Problem List   Diagnosis Date Noted  . Cervical shortening affecting pregnancy 09/20/2016  . Supervision of normal first pregnancy 05/19/2016  . Pelvic pain in female 07/28/2015   Objective:    BP 102/71   Pulse (!) 108   Wt 146 lb (66.2 kg)   LMP 03/10/2016 (Exact Date)   BMI 20.95 kg/m  FHT:  143 BPM  Uterine Size: 28 cm and size equals dates  Presentation: cephalic     Assessment:    Pregnancy @ 5838w0d weeks   Cervical shortening   Plan:    F/U US scheduled   labs reviewed, problem list updated Consent signed. GBS planning TDAP offered  Rhogam given for RH negative Pediatrician: discussed. Infant feeding: plans to breastfeed. Maternity leave: discussed. Cigarette smoking: never smoked. No orders of the defined types were placed in this encounter.  No orders of the defined types were placed in this encounter.  Follow up in 2 Weeks.

## 2016-10-05 LAB — CBC
HEMATOCRIT: 38.2 % (ref 34.0–46.6)
HEMOGLOBIN: 13 g/dL (ref 11.1–15.9)
MCH: 34.1 pg — AB (ref 26.6–33.0)
MCHC: 34 g/dL (ref 31.5–35.7)
MCV: 100 fL — ABNORMAL HIGH (ref 79–97)
Platelets: 229 10*3/uL (ref 150–379)
RBC: 3.81 x10E6/uL (ref 3.77–5.28)
RDW: 13 % (ref 12.3–15.4)
WBC: 12.9 10*3/uL — ABNORMAL HIGH (ref 3.4–10.8)

## 2016-10-05 LAB — HIV ANTIBODY (ROUTINE TESTING W REFLEX): HIV Screen 4th Generation wRfx: NONREACTIVE

## 2016-10-05 LAB — GLUCOSE TOLERANCE, 2 HOURS W/ 1HR
GLUCOSE, 1 HOUR: 108 mg/dL (ref 65–179)
Glucose, 2 hour: 94 mg/dL (ref 65–152)
Glucose, Fasting: 69 mg/dL (ref 65–91)

## 2016-10-05 LAB — RPR: RPR Ser Ql: NONREACTIVE

## 2016-10-07 ENCOUNTER — Other Ambulatory Visit: Payer: Self-pay | Admitting: Certified Nurse Midwife

## 2016-10-07 DIAGNOSIS — Z3403 Encounter for supervision of normal first pregnancy, third trimester: Secondary | ICD-10-CM

## 2016-10-08 ENCOUNTER — Ambulatory Visit (INDEPENDENT_AMBULATORY_CARE_PROVIDER_SITE_OTHER): Payer: Medicaid Other

## 2016-10-08 DIAGNOSIS — O26872 Cervical shortening, second trimester: Secondary | ICD-10-CM | POA: Diagnosis not present

## 2016-10-12 ENCOUNTER — Other Ambulatory Visit: Payer: Self-pay | Admitting: Certified Nurse Midwife

## 2016-10-12 DIAGNOSIS — O26879 Cervical shortening, unspecified trimester: Secondary | ICD-10-CM

## 2016-10-15 ENCOUNTER — Encounter: Payer: Self-pay | Admitting: Certified Nurse Midwife

## 2016-10-20 ENCOUNTER — Ambulatory Visit (INDEPENDENT_AMBULATORY_CARE_PROVIDER_SITE_OTHER): Payer: Medicaid Other | Admitting: Obstetrics & Gynecology

## 2016-10-20 VITALS — BP 108/67 | HR 81 | Wt 146.5 lb

## 2016-10-20 DIAGNOSIS — Z3403 Encounter for supervision of normal first pregnancy, third trimester: Secondary | ICD-10-CM

## 2016-10-20 NOTE — Progress Notes (Signed)
   PRENATAL VISIT NOTE  Subjective:  Terry [name] is a 20 y.o. G1P0000 at 6274w2d being seen today for ongoing prenatal care.  She is currently monitored for the following issues for this low-risk pregnancy and has Pelvic pain in female; Supervision of normal first pregnancy; and Cervical shortening affecting pregnancy on her problem list.  Patient reports no complaints.  Contractions: Not present.  .  Movement: Present. Denies leaking of fluid.   The following portions of the patient's history were reviewed and updated as appropriate: allergies, current medications, past family history, past medical history, past social history, past surgical history and problem list. Problem list updated.  Objective:   Vitals:   10/20/16 1108  BP: 108/67  Pulse: 81  Weight: 146 lb 8 oz (66.5 kg)    Fetal Status:     Movement: Present     General:  Alert, oriented and cooperative. Patient is in no acute distress.  Skin: Skin is warm and dry. No rash noted.   Cardiovascular: Normal heart rate noted  Respiratory: Normal respiratory effort, no problems with respiration noted  Abdomen: Soft, gravid, appropriate for gestational age. Pain/Pressure: Absent     Pelvic:  Cervical exam deferred        Extremities: Normal range of motion.     Mental Status: Normal mood and affect. Normal behavior. Normal judgment and thought content.   Assessment and Plan:  Pregnancy: G1P0000 at 4174w2d  1. Encounter for supervision of normal first pregnancy in third trimester   Preterm labor symptoms and general obstetric precautions including but not limited to vaginal bleeding, contractions, leaking of fluid and fetal movement were reviewed in detail with the patient. Please refer to After Visit Summary for other counseling recommendations.  No Follow-up on file.   Allie BossierMyra C Yanna Leaks, MD

## 2016-10-24 ENCOUNTER — Inpatient Hospital Stay (HOSPITAL_COMMUNITY)
Admission: AD | Admit: 2016-10-24 | Discharge: 2016-10-24 | Disposition: A | Discharge status: Home / Self Care | Payer: Medicaid Other | Source: Ambulatory Visit | Attending: Obstetrics and Gynecology | Admitting: Obstetrics and Gynecology

## 2016-10-24 ENCOUNTER — Encounter (HOSPITAL_COMMUNITY): Payer: Self-pay | Admitting: *Deleted

## 2016-10-24 DIAGNOSIS — Z3A30 30 weeks gestation of pregnancy: Secondary | ICD-10-CM | POA: Insufficient documentation

## 2016-10-24 DIAGNOSIS — N760 Acute vaginitis: Secondary | ICD-10-CM | POA: Diagnosis not present

## 2016-10-24 DIAGNOSIS — O26893 Other specified pregnancy related conditions, third trimester: Secondary | ICD-10-CM | POA: Insufficient documentation

## 2016-10-24 DIAGNOSIS — B9689 Other specified bacterial agents as the cause of diseases classified elsewhere: Secondary | ICD-10-CM | POA: Diagnosis not present

## 2016-10-24 DIAGNOSIS — Z79899 Other long term (current) drug therapy: Secondary | ICD-10-CM | POA: Insufficient documentation

## 2016-10-24 LAB — URINALYSIS, ROUTINE W REFLEX MICROSCOPIC
Bilirubin Urine: NEGATIVE
GLUCOSE, UA: 50 mg/dL — AB
HGB URINE DIPSTICK: NEGATIVE
KETONES UR: NEGATIVE mg/dL
Leukocytes, UA: NEGATIVE
Nitrite: NEGATIVE
PH: 5 (ref 5.0–8.0)
PROTEIN: NEGATIVE mg/dL
Specific Gravity, Urine: 1.009 (ref 1.005–1.030)

## 2016-10-24 LAB — AMNISURE RUPTURE OF MEMBRANE (ROM) NOT AT ARMC: AMNISURE: NEGATIVE

## 2016-10-24 LAB — FETAL FIBRONECTIN: FETAL FIBRONECTIN: NEGATIVE

## 2016-10-24 LAB — WET PREP, GENITAL
Sperm: NONE SEEN
TRICH WET PREP: NONE SEEN
Yeast Wet Prep HPF POC: NONE SEEN

## 2016-10-24 LAB — OB RESULTS CONSOLE GC/CHLAMYDIA: Gonorrhea: NEGATIVE

## 2016-10-24 MED ORDER — BETAMETHASONE SOD PHOS & ACET 6 (3-3) MG/ML IJ SUSP: 12.0000 mg | Freq: Once | INTRAMUSCULAR | Status: DC

## 2016-10-24 MED ORDER — LACTATED RINGERS IV BOLUS (SEPSIS): 1000.0000 mL | Freq: Once | INTRAVENOUS | Status: DC

## 2016-10-24 MED ORDER — NIFEDIPINE 10 MG PO CAPS: 10.0000 mg | ORAL_CAPSULE | Freq: Once | ORAL | Status: DC

## 2016-10-24 MED ORDER — METRONIDAZOLE 500 MG PO TABS: 500.0000 mg | ORAL_TABLET | Freq: Two times a day (BID) | ORAL | 0 refills | Status: DC

## 2016-10-24 NOTE — Discharge Instructions (Signed)

## 2016-10-24 NOTE — MAU Note (Signed)
Pt reports green discharge 2 days ago. I was gone yesterday, but back again today. Denies pain or vaginal irritation. Denies bleeding. + FM

## 2016-10-24 NOTE — MAU Note (Signed)
Pt reports 2 days ago she had green discharge when she wiped, happened again today.

## 2016-10-24 NOTE — Progress Notes (Addendum)
Assumed care of pt after receiving report from previous shift nurse. Monitors adjusted. Turned pt to side. States does not feel the ctx or tightening when asked. Pending lab result for r/o rupture  2156: amnisure negative  2220: Provider aware of FFN negative.   2222: Discharge instructions given with pt understanding  2232: Pt left unit via ambulatory with family.   E-signature not working. Paper copy obtained.

## 2016-10-24 NOTE — MAU Provider Note (Signed)
History   [id number]   Chief Complaint  Patient presents with  . Vaginal Discharge    HPI Terry [name] is a 20 y.o. female  G1P0000 at 5978w6d IUP here with report of green vaginal discharge x 2 days.  No report of vaginal bleeding or leaking of fluid. +fetal movement.    Patient's last menstrual period was 03/10/2016 (exact date).  OB History  Gravida Para Term Preterm AB Living  1       0 0  SAB TAB Ectopic Multiple Live Births  0            # Outcome Date GA Lbr Len/2nd Weight Sex Delivery Anes PTL Lv  1 Current               Past Medical History:  Diagnosis Date  . Chlamydia   . Gonorrhea     Family History  Problem Relation Age of Onset  . Cancer Maternal Grandmother     pancreatic  . Diabetes Maternal Uncle   . Diabetes Mother   . Hypertension Mother     Social History   Social History  . Marital status: Single    Spouse name: N/A  . Number of children: N/A  . Years of education: N/A   Social History Main Topics  . Smoking status: Never Smoker  . Smokeless tobacco: Never Used  . Alcohol use No  . Drug use: No  . Sexual activity: Yes    Birth control/ protection: Condom, None     Comment: 1 partner X 2 years   Other Topics Concern  . None   Social History Narrative  . None    No Known Allergies  No current facility-administered medications on file prior to encounter.    Current Outpatient Prescriptions on File Prior to Encounter  Medication Sig Dispense Refill  . Prenat-FeAsp-Meth-FA-DHA w/o A (PRENATE PIXIE) 10-0.6-0.4-200 MG CAPS Take 1 tablet by mouth daily. 30 capsule 12  . acetaminophen (TYLENOL) 500 MG tablet Take 1 tablet (500 mg total) by mouth every 6 (six) hours as needed for mild pain or moderate pain. (Patient not taking: Reported on 10/04/2016) 30 tablet 0  . Doxylamine-Pyridoxine 10-10 MG TBEC Take 2 tablets by mouth at bedtime as needed. (Patient not taking: Reported on 10/04/2016) 100 tablet 1  . hydrocortisone cream 1 %  Apply to affected area 2 times daily (Patient not taking: Reported on 10/04/2016) 15 g 0     Review of Systems  Genitourinary: Positive for vaginal discharge. Negative for dysuria, frequency, pelvic pain and vaginal bleeding.  All other systems reviewed and are negative.    Physical Exam   Vitals:   10/24/16 2004  BP: 110/67  Pulse: 99  Resp: 16  Temp: 98.6 F (37 C)  TempSrc: Oral  SpO2: 98%  Weight: 147 lb (66.7 kg)  Height: 5\' 10"  (1.778 m)    Physical Exam  Constitutional: She is oriented to person, place, and time. She appears well-developed and well-nourished. No distress.  HENT:  Head: Normocephalic.  Neck: Normal range of motion. Neck supple.  Cardiovascular: Normal rate, regular rhythm and normal heart sounds.  Exam reveals no gallop and no friction rub.   No murmur heard. Respiratory: Effort normal and breath sounds normal. No respiratory distress.  GI: Soft. There is no tenderness. There is no CVA tenderness.  Genitourinary: Cervix exhibits no motion tenderness and no discharge. No bleeding in the vagina. Vaginal discharge (white, mucus-like discharge) found.  Musculoskeletal: Normal range of  motion.  Neurological: She is alert and oriented to person, place, and time.  Skin: Skin is warm and dry.  Psychiatric: She has a normal mood and affect.      MAU Course  Procedures  MDM Results for orders placed or performed during the hospital encounter of 10/24/16 (from the past 24 hour(s))  Wet prep, genital     Status: Abnormal   Collection Time: 10/24/16  8:50 PM  Result Value Ref Range   Yeast Wet Prep HPF POC NONE SEEN NONE SEEN   Trich, Wet Prep NONE SEEN NONE SEEN   Clue Cells Wet Prep HPF POC PRESENT (A) NONE SEEN   WBC, Wet Prep HPF POC MODERATE (A) NONE SEEN   Sperm NONE SEEN   Fetal fibronectin     Status: None   Collection Time: 10/24/16  8:50 PM  Result Value Ref Range   Fetal Fibronectin NEGATIVE NEGATIVE  Amnisure [facility] membrane  [facility]     Status: None   Collection Time: 10/24/16  9:00 PM  Result Value Ref Range   Amnisure ROM NEGATIVE   Urinalysis, Routine w reflex microscopic     Status: Abnormal   Collection Time: 10/24/16  9:05 PM  Result Value Ref Range   Color, Urine YELLOW YELLOW   APPearance CLEAR CLEAR   Specific Gravity, Urine 1.009 1.005 - 1.030   pH 5.0 5.0 - 8.0   Glucose, UA 50 (A) NEGATIVE mg/dL   Hgb urine dipstick NEGATIVE NEGATIVE   Bilirubin Urine NEGATIVE NEGATIVE   Ketones, ur NEGATIVE NEGATIVE mg/dL   Protein, ur NEGATIVE NEGATIVE mg/dL   Nitrite NEGATIVE NEGATIVE   Leukocytes, UA NEGATIVE NEGATIVE    Dilation: Closed Effacement (%): Thick Cervical Position: Posterior Exam by:: Margarita MailW. Karim, CNM  FHR 122, +accels Toco 4-7 (not felt by patient) Assessment and Plan  20 y.o. G1P0000 at 6266w6d IUP  Bacterial Vaginosis Reactive NST Braxton Hicks (not felt by patient)  Plan: Discharge home GC/CT pending RX Flagyl 500 mg BID x 7 days Reviewed preterm labor precautions.   Eino FarberWalidah Kennith GainN Karim, CNM   Marlis EdelsonWalidah N Karim, CNM 10/24/2016 8:39 PM

## 2016-10-25 LAB — GC/CHLAMYDIA PROBE AMP (~~LOC~~) NOT AT ARMC
CHLAMYDIA, DNA PROBE: NEGATIVE
NEISSERIA GONORRHEA: NEGATIVE

## 2016-11-04 ENCOUNTER — Encounter: Payer: Self-pay | Admitting: Obstetrics

## 2016-11-05 ENCOUNTER — Telehealth: Payer: Self-pay

## 2016-11-05 NOTE — Telephone Encounter (Signed)
Patient called needing a letter for accommodations.

## 2016-11-08 NOTE — L&D Delivery Note (Signed)
Delivery Note At 4:28 AM a viable female was delivered via Vaginal, Spontaneous Delivery (Presentation: OA).  APGAR: 8,9; weight 8+7.   Placenta status: delivered intact.  Cord:  with the following complications: none.  Cord pH: n/a  Anesthesia:   Episiotomy: None Lacerations:  Bilateral labial  Suture Repair: 3.0 vicryl rapide Est. Blood Loss (mL): 200  Boggy LUS and brisk bleeding-Cytotec 800 mcg pr-bleeding improved and uterus firm Mom to postpartum.  Baby to Couplet care / Skin to Skin.  Donette LarryMelanie Adama Ivins, CNM 12/28/2016, 4:56 AM

## 2016-11-10 ENCOUNTER — Encounter: Payer: Self-pay | Admitting: Obstetrics

## 2016-11-10 ENCOUNTER — Ambulatory Visit (INDEPENDENT_AMBULATORY_CARE_PROVIDER_SITE_OTHER): Payer: Medicaid Other | Admitting: Obstetrics

## 2016-11-10 VITALS — BP 94/58 | HR 71 | Wt 151.4 lb

## 2016-11-10 DIAGNOSIS — Z3480 Encounter for supervision of other normal pregnancy, unspecified trimester: Secondary | ICD-10-CM

## 2016-11-10 DIAGNOSIS — N898 Other specified noninflammatory disorders of vagina: Secondary | ICD-10-CM

## 2016-11-10 DIAGNOSIS — O26893 Other specified pregnancy related conditions, third trimester: Secondary | ICD-10-CM

## 2016-11-10 NOTE — Progress Notes (Signed)
Patient is having thin clear discharge- not constant. Patient has lower abdominal discomfort.

## 2016-11-10 NOTE — Progress Notes (Signed)
Subjective:    Evlyn ClinesKiaheshia Belmonte is a 21 y.o. female being seen today for her obstetrical visit. She is at 3018w2d gestation. Patient reports leaking clear fluid.  Denies UC's.. Fetal movement: normal.  Problem List Items Addressed This Visit    None    Visit Diagnoses    Vaginal discharge during pregnancy in third trimester    -  Primary   Relevant Orders   Strep Gp B NAA     Patient Active Problem List   Diagnosis Date Noted  . Cervical shortening affecting pregnancy 09/20/2016  . Supervision of normal first pregnancy 05/19/2016  . Pelvic pain in female 07/28/2015   Objective:    BP (!) 94/58   Pulse 71   Wt 151 lb 6.4 oz (68.7 kg)   LMP 03/10/2016 (Exact Date)   BMI 21.72 kg/m  FHT:  150 BPM  Uterine Size: size equals dates  Presentation: unsure     SSE:  No pooling.  Nitrazine negative. Cvx:  Long / Closed Assessment:    Pregnancy @ 7218w2d weeks    No evidence of PPROM.   Plan:    PPROM and PTL precautions given.   labs reviewed, problem list updated Consent signed. GBS sent TDAP offered  Rhogam given for RH negative Pediatrician: discussed. Infant feeding: plans to breastfeed. Maternity leave: discussed. Cigarette smoking: never smoked. Orders Placed This Encounter  Procedures  . Strep Gp B NAA   No orders of the defined types were placed in this encounter.  Follow up in 2 Weeks.   Patient ID: Evlyn ClinesKiaheshia Stockert, female   DOB: 05/29/1996, 21 y.o.   MRN: [id number]

## 2016-11-12 LAB — STREP GP B NAA: Strep Gp B NAA: NEGATIVE

## 2016-11-24 ENCOUNTER — Encounter (HOSPITAL_COMMUNITY): Payer: Self-pay | Admitting: *Deleted

## 2016-11-24 ENCOUNTER — Inpatient Hospital Stay (HOSPITAL_COMMUNITY)
Admission: AD | Admit: 2016-11-24 | Discharge: 2016-11-24 | Disposition: A | Discharge status: Home / Self Care | Payer: Medicaid Other | Source: Ambulatory Visit | Attending: Family Medicine | Admitting: Family Medicine

## 2016-11-24 ENCOUNTER — Inpatient Hospital Stay (HOSPITAL_COMMUNITY): Payer: Medicaid Other

## 2016-11-24 DIAGNOSIS — R509 Fever, unspecified: Secondary | ICD-10-CM | POA: Diagnosis not present

## 2016-11-24 DIAGNOSIS — Z3A35 35 weeks gestation of pregnancy: Secondary | ICD-10-CM | POA: Insufficient documentation

## 2016-11-24 DIAGNOSIS — O4290 Premature rupture of membranes, unspecified as to length of time between rupture and onset of labor, unspecified weeks of gestation: Secondary | ICD-10-CM | POA: Diagnosis not present

## 2016-11-24 DIAGNOSIS — N898 Other specified noninflammatory disorders of vagina: Secondary | ICD-10-CM

## 2016-11-24 LAB — CBC WITH DIFFERENTIAL/PLATELET
Basophils Absolute: 0 10*3/uL (ref 0.0–0.1)
Basophils Relative: 0 %
Eosinophils Absolute: 0.1 10*3/uL (ref 0.0–0.7)
Eosinophils Relative: 0 %
HEMATOCRIT: 38.6 % (ref 36.0–46.0)
Hemoglobin: 13.4 g/dL (ref 12.0–15.0)
Lymphocytes Relative: 13 %
Lymphs Abs: 1.7 10*3/uL (ref 0.7–4.0)
MCH: 34.4 pg — AB (ref 26.0–34.0)
MCHC: 34.7 g/dL (ref 30.0–36.0)
MCV: 99 fL (ref 78.0–100.0)
MONO ABS: 0.7 10*3/uL (ref 0.1–1.0)
Monocytes Relative: 5 %
NEUTROS ABS: 10.5 10*3/uL — AB (ref 1.7–7.7)
Neutrophils Relative %: 82 %
Platelets: 240 10*3/uL (ref 150–400)
RBC: 3.9 MIL/uL (ref 3.87–5.11)
RDW: 13.6 % (ref 11.5–15.5)
WBC: 13 10*3/uL — ABNORMAL HIGH (ref 4.0–10.5)

## 2016-11-24 LAB — WET PREP, GENITAL
Clue Cells Wet Prep HPF POC: NONE SEEN
Sperm: NONE SEEN
TRICH WET PREP: NONE SEEN
YEAST WET PREP: NONE SEEN

## 2016-11-24 LAB — POCT FERN TEST: POCT Fern Test: NEGATIVE

## 2016-11-24 NOTE — MAU Provider Note (Signed)
History     CSN: [id number]  Arrival date and time: 11/24/16 1431   First Provider Initiated Contact with Patient 11/24/16 1502      Chief Complaint  Patient presents with  . possible leaking   Patient is a 21 year old G1 P0 at 35 weeks and 2 days has been complaining of some leaking for the past month. She had M Essure in the office in early December which was negative she reports she's had discharge since then. Today discharge significantly increased. She denies any abdominal pain. She has no other concerns or complaints. She did have a fever of 100.3 on admission but denies any viral symptoms. She denies any nausea vomiting no congestion no ear pain no sore throat no cough. She denies experiencing fevers or chills however.    OB History    Gravida Para Term Preterm AB Living   1       0 0   SAB TAB Ectopic Multiple Live Births   0              Past Medical History:  Diagnosis Date  . Chlamydia   . Gonorrhea     Past Surgical History:  Procedure Laterality Date  . NO PAST SURGERIES      Family History  Problem Relation Age of Onset  . Cancer Maternal Grandmother     pancreatic  . Diabetes Maternal Uncle   . Diabetes Mother   . Hypertension Mother     Social History  Substance Use Topics  . Smoking status: Never Smoker  . Smokeless tobacco: Never Used  . Alcohol use No    Allergies: No Known Allergies  No prescriptions prior to admission.    Review of Systems  Constitutional: Negative for activity change, appetite change, chills and fever.  HENT: Negative for congestion, ear pain, facial swelling, postnasal drip, rhinorrhea and sore throat.   Eyes: Negative for pain, discharge and itching.  Respiratory: Negative for cough, chest tightness and shortness of breath.   Cardiovascular: Negative for chest pain and palpitations.  Gastrointestinal: Negative for abdominal distention, abdominal pain, constipation, diarrhea, nausea and vomiting.  Genitourinary:  Negative for difficulty urinating, dysuria and frequency.  Musculoskeletal: Negative for arthralgias and back pain.   Physical Exam   Blood pressure 111/70, pulse 92, temperature 98.3 F (36.8 C), resp. rate 18, weight 153 lb 12 oz (69.7 kg), last menstrual period 03/10/2016.  Physical Exam  Vitals reviewed. Constitutional: She appears well-developed and well-nourished.  Cardiovascular: Normal rate and intact distal pulses.   Respiratory: Effort normal. No respiratory distress.  GI: Soft. Bowel sounds are normal. She exhibits no distension. There is no tenderness. There is no rebound and no guarding.  Genitourinary:  Genitourinary Comments: Vaginal vault with no significant pooling, no ferning. No abnormal discharge. Cervix appears closed.  Skin: Skin is warm and dry.  Psychiatric: She has a normal mood and affect. Her behavior is normal.    MAU Course  Procedures  MDM In MA U patient underwent vaginal exam which revealed no signs of rupture membranes however given fever and concern for leakage over some time ultrasound for AFI was performed. AFI was found to be within normal limits. Recheck of patient's temperature was within normal limits as well. CBC was checked to evaluate for occult rupture or other infectious etiology. WBC count was unremarkable.   Assessment and Plan  1. Vaginal Discharge: Likely physiologic. Nothing to support the diagnosis of pre-mature rupture of membranes. D/C home   [employer]  Marshon Bangs 11/24/2016, 7:12 PM

## 2016-11-24 NOTE — MAU Note (Signed)
2 wks ago was checked, thought she was leaking, was told everything was fine- not ruptured, fluid level ok, cervix closed. Past week has been leaking, daily seeing fluid, amt has increased.  Called hot line, was told to come in .

## 2016-11-24 NOTE — MAU Note (Signed)
Urine in lab 

## 2016-11-25 ENCOUNTER — Encounter: Payer: Medicaid Other | Admitting: Family Medicine

## 2016-11-29 ENCOUNTER — Encounter: Payer: Self-pay | Admitting: Obstetrics

## 2016-11-29 ENCOUNTER — Ambulatory Visit (INDEPENDENT_AMBULATORY_CARE_PROVIDER_SITE_OTHER): Payer: Medicaid Other | Admitting: Obstetrics

## 2016-11-29 VITALS — BP 119/80 | HR 102 | Wt 153.0 lb

## 2016-11-29 DIAGNOSIS — Z3403 Encounter for supervision of normal first pregnancy, third trimester: Secondary | ICD-10-CM

## 2016-11-29 DIAGNOSIS — Z3480 Encounter for supervision of other normal pregnancy, unspecified trimester: Secondary | ICD-10-CM

## 2016-11-29 NOTE — Progress Notes (Signed)
Subjective:    Terry [name] is a 21 y.o. female being seen today for her obstetrical visit. She is at 9713w0d gestation. Patient reports no complaints. Fetal movement: normal.  Problem List Items Addressed This Visit    Supervision of normal first pregnancy     Patient Active Problem List   Diagnosis Date Noted  . Cervical shortening affecting pregnancy 09/20/2016  . Supervision of normal first pregnancy 05/19/2016  . Pelvic pain in female 07/28/2015   Objective:    BP 119/80   Pulse (!) 102   Wt 153 lb (69.4 kg)   LMP 03/10/2016 (Exact Date)   BMI 21.95 kg/m  FHT:  150 BPM  Uterine Size: size equals dates  Presentation: unsure     Assessment:    Pregnancy @ 5113w0d weeks   Plan:     labs reviewed, problem list updated Consent signed. GBS sent TDAP offered  Rhogam given for RH negative Pediatrician: discussed. Infant feeding: plans to breastfeed. Maternity leave: discussed. Cigarette smoking: never smoked. No orders of the defined types were placed in this encounter.  No orders of the defined types were placed in this encounter.  Follow up in 1 Week.   Patient ID: Terry [name], female   DOB: 01/23/1996, 23 y.o.   MRN: [id number]

## 2016-11-29 NOTE — Progress Notes (Signed)
Pt states no concerns today.   

## 2016-12-05 ENCOUNTER — Encounter (HOSPITAL_COMMUNITY): Payer: Self-pay | Admitting: Emergency Medicine

## 2016-12-05 ENCOUNTER — Emergency Department (HOSPITAL_COMMUNITY)
Admission: EM | Admit: 2016-12-05 | Discharge: 2016-12-05 | Disposition: A | Discharge status: Home / Self Care | Payer: Medicaid Other | Attending: Emergency Medicine | Admitting: Emergency Medicine

## 2016-12-05 DIAGNOSIS — Z3A36 36 weeks gestation of pregnancy: Secondary | ICD-10-CM | POA: Diagnosis not present

## 2016-12-05 DIAGNOSIS — O26899 Other specified pregnancy related conditions, unspecified trimester: Secondary | ICD-10-CM

## 2016-12-05 DIAGNOSIS — R102 Pelvic and perineal pain: Secondary | ICD-10-CM | POA: Diagnosis not present

## 2016-12-05 DIAGNOSIS — O26893 Other specified pregnancy related conditions, third trimester: Secondary | ICD-10-CM | POA: Diagnosis not present

## 2016-12-05 MED ORDER — ONDANSETRON HCL 4 MG/2ML IJ SOLN
4.0000 mg | Freq: Once | INTRAMUSCULAR | Status: AC
  Administered 2016-12-05: 4 mg via INTRAVENOUS
  Filled 2016-12-05: qty 2

## 2016-12-05 MED ORDER — SODIUM CHLORIDE 0.9 % IV BOLUS (SEPSIS)
1000.0000 mL | Freq: Once | INTRAVENOUS | Status: AC
  Administered 2016-12-05: 1000 mL via INTRAVENOUS

## 2016-12-05 MED ORDER — MORPHINE SULFATE (PF) 4 MG/ML IV SOLN
4.0000 mg | Freq: Once | INTRAVENOUS | Status: AC
  Administered 2016-12-05: 4 mg via INTRAVENOUS
  Filled 2016-12-05: qty 1

## 2016-12-05 NOTE — ED Notes (Signed)
Bed: EA54WA11 Expected date: 12/05/16 Expected time: 3:14 PM Means of arrival: Ambulance Comments: Hip dislocation

## 2016-12-05 NOTE — Progress Notes (Signed)
Spoke with Dr. Macon LargeAnyanwu. Pt's cervix is still the same. Not contracting as frequently, but still complaining of vaginal pressure. Says pt can take extra strength tylenol for the discomfort. FHR is a category 1 tracing. Ed staff notified.

## 2016-12-05 NOTE — Progress Notes (Signed)
Spoke with Dr. Macon LargeAnyanwu. Pt is a G1P0 at 5536 6/[redacted] weeks gestation here with c/o lower abd pain and pressure. FHR tracing is a category 1 with frequent uc's. Cervix is 2-3cm dilated, 80% effaced, and the vertex is at a 0 station. We are starting IVF and giving the pt 4mg  of IV morphine. Will continue to monitor pt. Dr. Macon LargeAnyanwu is agreeable with our plan of care.

## 2016-12-05 NOTE — ED Provider Notes (Signed)
[facility]-EMERGENCY DEPT Provider Note   CSN: [id number] Arrival date & time: 12/05/16  1520     History   Chief Complaint Chief Complaint  Patient presents with  . Pelvic Pain    Pregnant    HPI Terry [name] is a 21 y.o. female.  HPI Terry ClinesKiaheshia [name] is a 21 y.o. female presents to emergency Department complaining of lower abdominal pain. PT is G1P0, Approximately [redacted] weeks gestation, started having lower abdominal cramping this morning. Cramping has gradually gotten worse. Patient denies any vaginal discharge, leakage of fluid, bleeding. She states pain has gradually worse throughout the day today. States he feels fullness in the pelvis. She states it hurts to "put my legs together." Denies any other complaints.   Past Medical History:  Diagnosis Date  . Chlamydia   . Gonorrhea     Patient Active Problem List   Diagnosis Date Noted  . Cervical shortening affecting pregnancy 09/20/2016  . Supervision of normal first pregnancy 05/19/2016  . Pelvic pain in female 07/28/2015    Past Surgical History:  Procedure Laterality Date  . NO PAST SURGERIES      OB History    Gravida Para Term Preterm AB Living   1       0 0   SAB TAB Ectopic Multiple Live Births   0               Home Medications    Prior to Admission medications   Medication Sig Start Date End Date Taking? Authorizing Provider  FOLIC ACID PO Take 1 tablet by mouth every morning.    Historical Provider, MD  Prenat-FeAsp-Meth-FA-DHA w/o A (PRENATE PIXIE) 10-0.6-0.4-200 MG CAPS Take 1 tablet by mouth daily. 05/19/16   Roe Coombsachelle A Denney, CNM    Family History Family History  Problem Relation Age of Onset  . Cancer Maternal Grandmother     pancreatic  . Diabetes Maternal Uncle   . Diabetes Mother   . Hypertension Mother     Social History Social History  Substance Use Topics  . Smoking status: Never Smoker  . Smokeless tobacco: Never Used  . Alcohol use No     Allergies   Patient has no  known allergies.   Review of Systems Review of Systems  Constitutional: Negative for chills and fever.  Gastrointestinal: Positive for abdominal pain. Negative for nausea and vomiting.  Genitourinary: Positive for vaginal pain. Negative for dysuria, vaginal bleeding and vaginal discharge.  All other systems reviewed and are negative.    Physical Exam Updated Vital Signs BP 132/85 (BP Location: Right Arm)   Pulse (!) 134   Temp 98.7 F (37.1 C) (Oral)   Resp 16   LMP 03/10/2016 (Exact Date)   SpO2 94%   Physical Exam  Constitutional: She is oriented to person, place, and time. She appears well-developed and well-nourished. No distress.  Eyes: Conjunctivae are normal.  Neck: Neck supple.  Cardiovascular: Normal rate, regular rhythm and normal heart sounds.   Pulmonary/Chest: Effort normal and breath sounds normal. No respiratory distress. She has no wheezes. She has no rales.  Abdominal: There is tenderness.  Gravid. Suprapubic tenderness  Genitourinary:  Genitourinary Comments: No vaginal fluid or bleeding. Cervix fully effaced, 3cm dilated  Neurological: She is alert and oriented to person, place, and time.  Skin: Skin is warm and dry.  Nursing note and vitals reviewed.    ED Treatments / Results  Labs (all labs ordered are listed, but only abnormal results are displayed) Labs  Reviewed - No data to display  EKG  EKG Interpretation None       Radiology No results found.  Procedures Procedures (including critical care time)  Medications Ordered in ED Medications - No data to display   Initial Impression / Assessment and Plan / ED Course  I have reviewed the triage vital signs and the nursing notes.  Pertinent labs & imaging results that were available during my care of the patient were reviewed by me and considered in my medical decision making (see chart for details).   3:45 MP Cervix check showed full effacement with 3 cm dilation. I spoke with OB  nurse who told me to call the OB/GYN on call, since she is at [facility] with another patient at this time. I tried multiple times to call OB/GYN with the number provided, however their phone appears to be not working. Called OB back, she is on the way.  4:12 PM OB at bedside. Plan on monitoring patient.   6:44 PM Spoke with OB nurse, cleared by OB/GYN. Pt to go home, drink plenty of fluids, continue tylenol for pain, follow up with OB outpatient or [facility].   Vitals:   12/05/16 1523 12/05/16 1807  BP: 132/85 130/76  Pulse: (!) 134 84  Resp: 16 17  Temp: 98.7 F (37.1 C) 98 F (36.7 C)  TempSrc: Oral Oral  SpO2: 94% 100%      Final Clinical Impressions(s) / ED Diagnoses   Final diagnoses:  Pelvic pain in pregnancy    New Prescriptions New Prescriptions   No medications on file     Jaynie Crumble, PA-C 12/05/16 1846    Mancel Bale, MD 12/08/16 [phone]

## 2016-12-05 NOTE — Discharge Instructions (Signed)
Drink plenty of fluids. Rest. Follow up with your OB/GYN on 31st. [facility].

## 2016-12-05 NOTE — ED Triage Notes (Signed)
Pt c/o low pelvic, vaginal, leg pain with pressure. No bleeding or change in vaginal discharge. No other symptoms or pain. Pt pregnant, EDD February 19, told she was 2 cm dilated 3 weeks ago. Prenatal care with [facility]. No complications with pregnancies. G1P0.

## 2016-12-05 NOTE — ED Notes (Signed)
Pt connected to fetal monitor and [facility] at bedside

## 2016-12-05 NOTE — Progress Notes (Signed)
Pt is a G1P0 at 1036 6/[redacted] weeks gestation here with c/o lower abd pain and pressure that started at 1430 today. No vaginal bleeding or leaking of fluid. [facility]. Denies any problems with this pregnancy I [facility]30 [facility] [address] [facility] another pt. Due to my location at the time, it took me longer than the allotted 25 min to get here.

## 2016-12-05 NOTE — Progress Notes (Signed)
Pt instructed to go home and rest, drink plenty of water, and keep her bladder empty. Instructed to take extra strength tylenol for discomfort. Pt has an appointment with [facility] on 12/08/2016. [facility], [facility] if she has vaginal bleeding, a gush of fluid or leaking of fluid from her vagina, regular uc's, feeling like the baby is "balling up, or decreased fetal movement.  Verbalizes understanding of d/C instructions.

## 2016-12-06 DIAGNOSIS — Z3493 Encounter for supervision of normal pregnancy, unspecified, third trimester: Secondary | ICD-10-CM

## 2016-12-08 ENCOUNTER — Ambulatory Visit (INDEPENDENT_AMBULATORY_CARE_PROVIDER_SITE_OTHER): Payer: Medicaid Other | Admitting: Obstetrics

## 2016-12-08 ENCOUNTER — Encounter: Payer: Self-pay | Admitting: Obstetrics

## 2016-12-08 VITALS — BP 101/68 | HR 76 | Wt 160.0 lb

## 2016-12-08 DIAGNOSIS — Z3400 Encounter for supervision of normal first pregnancy, unspecified trimester: Secondary | ICD-10-CM

## 2016-12-08 DIAGNOSIS — Z3483 Encounter for supervision of other normal pregnancy, third trimester: Secondary | ICD-10-CM

## 2016-12-08 DIAGNOSIS — Z3480 Encounter for supervision of other normal pregnancy, unspecified trimester: Secondary | ICD-10-CM

## 2016-12-08 NOTE — Progress Notes (Signed)
Patient has had continued pain in her pelvis and back. She is having leakage and wants to be checked again.

## 2016-12-08 NOTE — Progress Notes (Signed)
Subjective:  Terry ClinesKiaheshia Jolly is a 21 y.o. G1P0000 at 10273w2d being seen today for ongoing prenatal care.  She is currently monitored for the following issues for this low-risk pregnancy and has Pelvic pain in female; Supervision of normal first pregnancy; and Cervical shortening affecting pregnancy on her problem list.  Patient reports occasional contractions and pressure.  Contractions: Irregular. Vag. Bleeding: None.  Movement: Present. Denies leaking of fluid.   The following portions of the patient's history were reviewed and updated as appropriate: allergies, current medications, past family history, past medical history, past social history, past surgical history and problem list. Problem list updated.  Objective:   Vitals:   12/08/16 1600  BP: 101/68  Pulse: 76  Weight: 160 lb (72.6 kg)    Fetal Status: Fetal Heart Rate (bpm): 150   Movement: Present     General:  Alert, oriented and cooperative. Patient is in no acute distress.  Skin: Skin is warm and dry. No rash noted.   Cardiovascular: Normal heart rate noted  Respiratory: Normal respiratory effort, no problems with respiration noted  Abdomen: Soft, gravid, appropriate for gestational age. Pain/Pressure: Present     Pelvic:  Cervical exam performed      2cm / 70% / 0 / Vtx, soft, posterior  Extremities: Normal range of motion.  Edema: None  Mental Status: Normal mood and affect. Normal behavior. Normal judgment and thought content.   Urinalysis:      Assessment and Plan:  Pregnancy: G1P0000 at 2873w2d  There are no diagnoses linked to this encounter. Term labor symptoms and general obstetric precautions including but not limited to vaginal bleeding, contractions, leaking of fluid and fetal movement were reviewed in detail with the patient. Please refer to After Visit Summary for other counseling recommendations.  No Follow-up on file.   Brock Badharles A Harper, MDPatient ID: Terry [name], female   DOB: 07/08/1996, 21 y.o.    MRN: [id number]

## 2016-12-15 ENCOUNTER — Encounter: Payer: Medicaid Other | Admitting: Obstetrics

## 2016-12-22 ENCOUNTER — Encounter: Payer: Medicaid Other | Admitting: Obstetrics

## 2016-12-27 ENCOUNTER — Telehealth (HOSPITAL_COMMUNITY): Payer: Self-pay | Admitting: *Deleted

## 2016-12-27 ENCOUNTER — Inpatient Hospital Stay (HOSPITAL_COMMUNITY): Payer: Medicaid Other | Admitting: Anesthesiology

## 2016-12-27 ENCOUNTER — Encounter (HOSPITAL_COMMUNITY): Payer: Self-pay | Admitting: *Deleted

## 2016-12-27 ENCOUNTER — Ambulatory Visit (INDEPENDENT_AMBULATORY_CARE_PROVIDER_SITE_OTHER): Payer: Medicaid Other | Admitting: Obstetrics

## 2016-12-27 ENCOUNTER — Encounter (HOSPITAL_COMMUNITY): Payer: Self-pay

## 2016-12-27 ENCOUNTER — Encounter: Payer: Self-pay | Admitting: Obstetrics

## 2016-12-27 ENCOUNTER — Inpatient Hospital Stay (HOSPITAL_COMMUNITY)
Admission: AD | Admit: 2016-12-27 | Discharge: 2016-12-30 | DRG: 775 | Disposition: A | Discharge status: Home / Self Care | Payer: Medicaid Other | Source: Ambulatory Visit | Attending: Obstetrics & Gynecology | Admitting: Obstetrics & Gynecology

## 2016-12-27 VITALS — BP 114/78 | HR 106 | Temp 98.3°F | Wt 162.6 lb

## 2016-12-27 DIAGNOSIS — O9952 Diseases of the respiratory system complicating childbirth: Secondary | ICD-10-CM | POA: Diagnosis present

## 2016-12-27 DIAGNOSIS — Z3A40 40 weeks gestation of pregnancy: Secondary | ICD-10-CM

## 2016-12-27 DIAGNOSIS — O480 Post-term pregnancy: Secondary | ICD-10-CM

## 2016-12-27 DIAGNOSIS — Z3483 Encounter for supervision of other normal pregnancy, third trimester: Secondary | ICD-10-CM

## 2016-12-27 DIAGNOSIS — Z3480 Encounter for supervision of other normal pregnancy, unspecified trimester: Secondary | ICD-10-CM

## 2016-12-27 DIAGNOSIS — J111 Influenza due to unidentified influenza virus with other respiratory manifestations: Secondary | ICD-10-CM | POA: Diagnosis present

## 2016-12-27 DIAGNOSIS — Z3400 Encounter for supervision of normal first pregnancy, unspecified trimester: Secondary | ICD-10-CM

## 2016-12-27 DIAGNOSIS — Z3493 Encounter for supervision of normal pregnancy, unspecified, third trimester: Secondary | ICD-10-CM | POA: Diagnosis present

## 2016-12-27 LAB — TYPE AND SCREEN
ABO/RH(D): B POS
Antibody Screen: NEGATIVE

## 2016-12-27 LAB — CBC
HCT: 36.1 % (ref 36.0–46.0)
Hemoglobin: 12.6 g/dL (ref 12.0–15.0)
MCH: 34.1 pg — ABNORMAL HIGH (ref 26.0–34.0)
MCHC: 34.9 g/dL (ref 30.0–36.0)
MCV: 97.6 fL (ref 78.0–100.0)
PLATELETS: 189 10*3/uL (ref 150–400)
RBC: 3.7 MIL/uL — ABNORMAL LOW (ref 3.87–5.11)
RDW: 13.4 % (ref 11.5–15.5)
WBC: 12.8 10*3/uL — AB (ref 4.0–10.5)

## 2016-12-27 MED ORDER — LIDOCAINE HCL (PF) 1 % IJ SOLN
30.0000 mL | INTRAMUSCULAR | Status: DC | PRN
  Filled 2016-12-27: qty 30

## 2016-12-27 MED ORDER — OXYTOCIN 40 UNITS IN LACTATED RINGERS INFUSION - SIMPLE MED
2.5000 [IU]/h | INTRAVENOUS | Status: DC
  Filled 2016-12-27: qty 1000

## 2016-12-27 MED ORDER — LACTATED RINGERS IV SOLN
500.0000 mL | INTRAVENOUS | Status: DC | PRN
  Administered 2016-12-28: 500 mL via INTRAVENOUS

## 2016-12-27 MED ORDER — PHENYLEPHRINE 40 MCG/ML (10ML) SYRINGE FOR IV PUSH (FOR BLOOD PRESSURE SUPPORT)
80.0000 ug | PREFILLED_SYRINGE | INTRAVENOUS | Status: DC | PRN
  Filled 2016-12-27: qty 10
  Filled 2016-12-27: qty 5

## 2016-12-27 MED ORDER — OXYCODONE-ACETAMINOPHEN 5-325 MG PO TABS: 2.0000 | ORAL_TABLET | ORAL | Status: DC | PRN

## 2016-12-27 MED ORDER — OXYCODONE-ACETAMINOPHEN 5-325 MG PO TABS: 1.0000 | ORAL_TABLET | ORAL | Status: DC | PRN

## 2016-12-27 MED ORDER — EPHEDRINE 5 MG/ML INJ
10.0000 mg | INTRAVENOUS | Status: DC | PRN
  Filled 2016-12-27: qty 4

## 2016-12-27 MED ORDER — LACTATED RINGERS IV SOLN
INTRAVENOUS | Status: DC
  Administered 2016-12-27: 20:00:00 via INTRAVENOUS

## 2016-12-27 MED ORDER — ACETAMINOPHEN 325 MG PO TABS: 650.0000 mg | ORAL_TABLET | ORAL | Status: DC | PRN

## 2016-12-27 MED ORDER — SOD CITRATE-CITRIC ACID 500-334 MG/5ML PO SOLN: 30.0000 mL | ORAL | Status: DC | PRN

## 2016-12-27 MED ORDER — ONDANSETRON HCL 4 MG/2ML IJ SOLN
4.0000 mg | Freq: Four times a day (QID) | INTRAMUSCULAR | Status: DC | PRN
  Filled 2016-12-27: qty 2

## 2016-12-27 MED ORDER — DIPHENHYDRAMINE HCL 50 MG/ML IJ SOLN: 12.5000 mg | INTRAMUSCULAR | Status: DC | PRN

## 2016-12-27 MED ORDER — PHENYLEPHRINE 40 MCG/ML (10ML) SYRINGE FOR IV PUSH (FOR BLOOD PRESSURE SUPPORT)
80.0000 ug | PREFILLED_SYRINGE | INTRAVENOUS | Status: DC | PRN
  Filled 2016-12-27: qty 5

## 2016-12-27 MED ORDER — FENTANYL 2.5 MCG/ML BUPIVACAINE 1/10 % EPIDURAL INFUSION (WH - ANES)
14.0000 mL/h | INTRAMUSCULAR | Status: DC | PRN
  Administered 2016-12-27: 14 mL/h via EPIDURAL
  Filled 2016-12-27: qty 100

## 2016-12-27 MED ORDER — OXYTOCIN BOLUS FROM INFUSION
500.0000 mL | Freq: Once | INTRAVENOUS | Status: AC
  Administered 2016-12-28: 500 mL via INTRAVENOUS

## 2016-12-27 MED ORDER — LACTATED RINGERS IV SOLN
500.0000 mL | Freq: Once | INTRAVENOUS | Status: AC
  Administered 2016-12-27: 500 mL via INTRAVENOUS

## 2016-12-27 MED ORDER — FLEET ENEMA 7-19 GM/118ML RE ENEM: 1.0000 | ENEMA | RECTAL | Status: DC | PRN

## 2016-12-27 NOTE — Progress Notes (Signed)
Patient is having diarrhea, pelvic pain, pain in buttocks, cough , and some vomiting.

## 2016-12-27 NOTE — Telephone Encounter (Signed)
Preadmission screen  

## 2016-12-27 NOTE — H&P (Signed)
Terry [name] is a 21 y.o. female G1P0000 with IUP at [redacted]w[redacted]d presenting for contractions. Pt states she has been having irregular, every unknown minutes contractions, associated with none vaginal bleeding for no hours..  Membranes are intact, with active fetal movement.   [facility]  Prenatal History/Complications:  Past Medical History: Past Medical History:  Diagnosis Date  . Chlamydia   . Gonorrhea     Past Surgical History: Past Surgical History:  Procedure Laterality Date  . NO PAST SURGERIES      Obstetrical History: OB History    Gravida Para Term Preterm AB Living   1       0 0   SAB TAB Ectopic Multiple Live Births   0               Social History: Social History   Social History  . Marital status: Single    Spouse name: N/A  . Number of children: N/A  . Years of education: N/A   Social History Main Topics  . Smoking status: Never Smoker  . Smokeless tobacco: Never Used  . Alcohol use No  . Drug use: No  . Sexual activity: Yes    Birth control/ protection: Condom, None     Comment: 1 partner X 2 years   Other Topics Concern  . None   Social History Narrative  . None    Family History: Family History  Problem Relation Age of Onset  . Cancer Maternal Grandmother     pancreatic  . Diabetes Maternal Uncle   . Diabetes Mother   . Hypertension Mother     Allergies: No Known Allergies  Prescriptions Prior to Admission  Medication Sig Dispense Refill Last Dose  . Prenat-FeAsp-Meth-FA-DHA w/o A (PRENATE PIXIE) 10-0.6-0.4-200 MG CAPS Take 1 tablet by mouth daily. 30 capsule 12 Taking        Review of Systems   Constitutional: Negative for fever and chills Eyes: Negative for visual disturbances Respiratory: Negative for shortness of breath, dyspnea Cardiovascular: Negative for chest pain or palpitations  Gastrointestinal: Negative for vomiting, diarrhea and constipation.  POSITIVE for abdominal pain  (contractions) Genitourinary: Negative for dysuria and urgency Musculoskeletal: Negative for back pain, joint pain, myalgias  Neurological: Negative for dizziness and headaches      Blood pressure 119/71, pulse 80, temperature 99.1 F (37.3 C), temperature source Oral, resp. rate 18, last menstrual period 03/10/2016. General appearance: alert, cooperative and no distress Lungs: clear to auscultation bilaterally Heart: regular rate and rhythm Abdomen: soft, non-tender; bowel sounds normal Extremities: Homans sign is negative, no sign of DVT DTR's 2+ Presentation: cephalic Fetal monitoring  Baseline: 140 bpm moderate variability; patient moving frequently in bed and EFM continuously readjusted Uterine activity  3-4 ,min Dilation: 4 Effacement (%): 90 Station: -2 Exam by:: Guadlupe Spanish RN    Prenatal labs: ABO, Rh: B/Positive/-- (07/12 1445) Antibody: Negative (07/12 1445) Rubella: !Error! RPR: Non Reactive (11/27 1130)  HBsAg: Negative (07/12 1445)  HIV: Non Reactive (11/27 1130)  GBS: Negative (01/03 1213)  1 hr Glucola 108 Genetic screening  normal Anatomy US normal  Prenatal Transfer Tool  Maternal Diabetes: No Genetic Screening: Normal Maternal Ultrasounds/Referrals: Normal Fetal Ultrasounds or other Referrals:  None Maternal Substance Abuse:  No Significant Maternal Medications:  None Significant Maternal Lab Results: None     No results found for this or any previous visit (from the past 24 hour(s)).  Assessment: Terry [name] is a 21 y.o. G1P0000 with  an IUP at 5739w0d presenting for SOL with contractions.  Plan: #Labor: expectant management #Pain:  Per request #FWB Cat 1 #ID: GBS: negative  #MOF:  bottle #MOC: OCP #Circ: girl   Marylene LandKathryn Lorraine Elston Aldape CNM 12/27/2016, 7:48 PM

## 2016-12-27 NOTE — MAU Note (Signed)
Pt presents to MAU by EMS for ctx. Pt reoprts she had her membranes stripped in the office today. She started having ctx around 1600; does not know how frequent they are. Pt denies bleeding or leaking of fluid. Good fetal movement.

## 2016-12-27 NOTE — Progress Notes (Signed)
Subjective:  Terry [name] is a 21 y.o. G1P0000 at 713w0d being seen today for ongoing prenatal care.  She is currently monitored for the following issues for this low-risk pregnancy and has Pelvic pain in female; Supervision of normal first pregnancy; and Cervical shortening affecting pregnancy on her problem list.  Patient reports occasional contractions.  Contractions: Irregular. Vag. Bleeding: None.  Movement: (!) Decreased. Denies leaking of fluid.   The following portions of the patient's history were reviewed and updated as appropriate: allergies, current medications, past family history, past medical history, past social history, past surgical history and problem list. Problem list updated.  Objective:   Vitals:   12/27/16 1033  BP: 114/78  Pulse: (!) 106  Temp: 98.3 F (36.8 C)  Weight: 162 lb 9.6 oz (73.8 kg)    Fetal Status: Fetal Heart Rate (bpm): 150   Movement: (!) Decreased     General:  Alert, oriented and cooperative. Patient is in no acute distress.  Skin: Skin is warm and dry. No rash noted.   Cardiovascular: Normal heart rate noted  Respiratory: Normal respiratory effort, no problems with respiration noted  Abdomen: Soft, gravid, appropriate for gestational age. Pain/Pressure: Present     Pelvic:  2cm / 80% / -2 / Vtx  Extremities: Normal range of motion.  Edema: None  Mental Status: Normal mood and affect. Normal behavior. Normal judgment and thought content.   Urinalysis:      Assessment and Plan:  Pregnancy: G1P0000 at 423w0d  1. Post-term pregnancy, 40-42 weeks of gestation Rx: - Fetal nonstress test:  Reactive.  Good fetal movement.  Term labor symptoms and general obstetric precautions including but not limited to vaginal bleeding, contractions, leaking of fluid and fetal movement were reviewed in detail with the patient. Please refer to After Visit Summary for other counseling recommendations.  No Follow-up on file.   Brock Badharles A Harper, MDPatient  ID: Terry [name], female   DOB: 09/13/1996, 21 y.o.   MRN: [id number] Patient ID: Terry [name], female   DOB: 01/01/1996, 21 y.o.   MRN: [id number]

## 2016-12-27 NOTE — Anesthesia Pain Management Evaluation Note (Signed)
  [facility] Pain Management Visit Note  Patient: Terry [name], 21 y.o., female  "Hello [facility]. We have an anesthesia team available at all times to provide care throughout the hospital, including epidural management and anesthesia for C-section. I don't know your plan for the delivery whether it a natural birth, water birth, IV sedation, nitrous supplementation, doula or epidural, but we want to meet your pain goals."   1.Was your pain managed to your expectations on prior hospitalizations?   No prior hospitalizations  2.What is your expectation for pain management during this hospitalization?     Epidural  3.How can we help you reach that goal?  Epidural is placed, Pt comfortable  Record the patient's initial score and the patient's pain goal.   Pain: 8  Pain Goal: 2 [facility] wants you to be able to say your pain was always managed very well.  Terry [name] 12/27/2016

## 2016-12-27 NOTE — Progress Notes (Signed)
Labor Progress Note Terry ClinesKiaheshia [name] is a 21 y.o. G1P0000 at 4722w0d presented for labor  S:  Comfortable with epidural  O:  BP 121/73   Pulse 93   Temp 98.7 F (37.1 C) (Oral)   Resp 16   LMP 03/10/2016 (Exact Date)   SpO2 100%  EFM: baseline 135 bpm/ mod variability/ + accels/ no decels  Toco: 2-4 SVE: Dilation: 4 Effacement (%): 90 Cervical Position: Anterior Station: -1, -2 Presentation: Vertex Exam by:: L.Mears,RN   A/P: 21 y.o. G1P0000 8922w0d  1. Labor: early active 2. FWB: Cat I 3. Pain: epidural  Anticipate labor progression and SVD.  Donette LarryMelanie Haris Baack, CNM 9:55 PM

## 2016-12-28 ENCOUNTER — Encounter (HOSPITAL_COMMUNITY): Payer: Self-pay | Admitting: *Deleted

## 2016-12-28 DIAGNOSIS — Z3A40 40 weeks gestation of pregnancy: Secondary | ICD-10-CM

## 2016-12-28 LAB — INFLUENZA PANEL BY PCR (TYPE A & B)
INFLAPCR: POSITIVE — AB
Influenza B By PCR: NEGATIVE

## 2016-12-28 LAB — SYPHILIS: RPR W/REFLEX TO RPR TITER AND TREPONEMAL ANTIBODIES, TRADITIONAL SCREENING AND DIAGNOSIS ALGORITHM: RPR Ser Ql: NONREACTIVE

## 2016-12-28 MED ORDER — ACETAMINOPHEN 325 MG PO TABS
650.0000 mg | ORAL_TABLET | ORAL | Status: DC | PRN
  Administered 2016-12-28: 650 mg via ORAL
  Filled 2016-12-28: qty 2

## 2016-12-28 MED ORDER — SENNOSIDES-DOCUSATE SODIUM 8.6-50 MG PO TABS
2.0000 | ORAL_TABLET | ORAL | Status: DC
  Administered 2016-12-29 – 2016-12-30 (×2): 2 via ORAL
  Filled 2016-12-28 (×2): qty 2

## 2016-12-28 MED ORDER — OSELTAMIVIR PHOSPHATE 75 MG PO CAPS
75.0000 mg | ORAL_CAPSULE | Freq: Two times a day (BID) | ORAL | Status: DC
  Administered 2016-12-28 – 2016-12-30 (×5): 75 mg via ORAL
  Filled 2016-12-28 (×7): qty 1

## 2016-12-28 MED ORDER — DEXTROSE 5 % IV SOLN
200.0000 mg | Freq: Once | INTRAVENOUS | Status: AC
  Administered 2016-12-28: 200 mg via INTRAVENOUS
  Filled 2016-12-28: qty 5

## 2016-12-28 MED ORDER — TETANUS-DIPHTH-ACELL PERTUSSIS 5-2.5-18.5 LF-MCG/0.5 IM SUSP: 0.5000 mL | Freq: Once | INTRAMUSCULAR | Status: DC

## 2016-12-28 MED ORDER — ONDANSETRON HCL 4 MG PO TABS: 4.0000 mg | ORAL_TABLET | ORAL | Status: DC | PRN

## 2016-12-28 MED ORDER — IBUPROFEN 600 MG PO TABS
600.0000 mg | ORAL_TABLET | Freq: Four times a day (QID) | ORAL | Status: DC
  Administered 2016-12-28 – 2016-12-30 (×9): 600 mg via ORAL
  Filled 2016-12-28 (×9): qty 1

## 2016-12-28 MED ORDER — DIBUCAINE 1 % RE OINT: 1.0000 "application " | TOPICAL_OINTMENT | RECTAL | Status: DC | PRN

## 2016-12-28 MED ORDER — ACETAMINOPHEN 500 MG PO TABS
1000.0000 mg | ORAL_TABLET | Freq: Once | ORAL | Status: AC
  Administered 2016-12-28: 1000 mg via ORAL
  Filled 2016-12-28: qty 2

## 2016-12-28 MED ORDER — DIPHENHYDRAMINE HCL 25 MG PO CAPS: 25.0000 mg | ORAL_CAPSULE | Freq: Four times a day (QID) | ORAL | Status: DC | PRN

## 2016-12-28 MED ORDER — COCONUT OIL OIL: 1.0000 "application " | TOPICAL_OIL | Status: DC | PRN

## 2016-12-28 MED ORDER — LIDOCAINE-EPINEPHRINE (PF) 2 %-1:200000 IJ SOLN
INTRAMUSCULAR | Status: DC | PRN
  Administered 2016-12-27: 3 mL

## 2016-12-28 MED ORDER — MISOPROSTOL 200 MCG PO TABS
ORAL_TABLET | ORAL | Status: AC
  Administered 2016-12-28: 800 ug
  Filled 2016-12-28: qty 4

## 2016-12-28 MED ORDER — GENTAMICIN SULFATE 40 MG/ML IJ SOLN
190.0000 mg | Freq: Three times a day (TID) | INTRAVENOUS | Status: DC
  Filled 2016-12-28: qty 4.75

## 2016-12-28 MED ORDER — BUPIVACAINE HCL (PF) 0.25 % IJ SOLN
INTRAMUSCULAR | Status: DC | PRN
  Administered 2016-12-27 (×2): 4 mL via EPIDURAL

## 2016-12-28 MED ORDER — SIMETHICONE 80 MG PO CHEW
80.0000 mg | CHEWABLE_TABLET | ORAL | Status: DC | PRN
Start: 2016-12-28 — End: 2016-12-30

## 2016-12-28 MED ORDER — WITCH HAZEL-GLYCERIN EX PADS: 1.0000 "application " | MEDICATED_PAD | CUTANEOUS | Status: DC | PRN

## 2016-12-28 MED ORDER — SODIUM CHLORIDE 0.9 % IV SOLN
2.0000 g | Freq: Four times a day (QID) | INTRAVENOUS | Status: AC
  Administered 2016-12-28 (×2): 2 g via INTRAVENOUS
  Filled 2016-12-28 (×3): qty 2000

## 2016-12-28 MED ORDER — PRENATAL MULTIVITAMIN CH
1.0000 | ORAL_TABLET | Freq: Every day | ORAL | Status: DC
  Administered 2016-12-29 – 2016-12-30 (×2): 1 via ORAL
  Filled 2016-12-28 (×2): qty 1

## 2016-12-28 MED ORDER — BENZOCAINE-MENTHOL 20-0.5 % EX AERO
1.0000 "application " | INHALATION_SPRAY | CUTANEOUS | Status: DC | PRN
  Filled 2016-12-28: qty 56

## 2016-12-28 MED ORDER — ONDANSETRON HCL 4 MG/2ML IJ SOLN
4.0000 mg | INTRAMUSCULAR | Status: DC | PRN
  Administered 2016-12-28: 4 mg via INTRAVENOUS

## 2016-12-28 MED ORDER — DEXTROMETHORPHAN POLISTIREX ER 30 MG/5ML PO SUER
15.0000 mg | Freq: Two times a day (BID) | ORAL | Status: DC | PRN
  Administered 2016-12-28 – 2016-12-29 (×2): 15 mg via ORAL
  Filled 2016-12-28 (×3): qty 5

## 2016-12-28 NOTE — Anesthesia Procedure Notes (Signed)
Epidural Patient location during procedure: OB Start time: 12/27/2016 8:44 PM End time: 12/27/2016 8:58 PM  Staffing Anesthesiologist: Val EagleMOSER, Drexel Ivey Performed: anesthesiologist   Preanesthetic Checklist Completed: patient identified, surgical consent, pre-op evaluation, timeout performed, IV checked, risks and benefits discussed and monitors and equipment checked  Epidural Patient position: sitting Prep: site prepped and draped and DuraPrep Patient monitoring: heart rate, cardiac monitor, continuous pulse ox and blood pressure Approach: midline Location: L4-L5 Injection technique: LOR saline  Needle:  Needle type: Tuohy  Needle gauge: 17 G Needle length: 9 cm Needle insertion depth: 6 cm Catheter type: closed end flexible Catheter size: 19 Gauge Catheter at skin depth: 11 cm Test dose: 2% lidocaine with Epi 1:200 K and negative  Assessment Events: blood not aspirated, injection not painful, no injection resistance, negative IV test and no paresthesia  Additional Notes H+P and labs checked, risks and benefits discussed with the patient, consent obtained, procedure tolerated well and without complications.  Reason for block:procedure for pain

## 2016-12-28 NOTE — Anesthesia Postprocedure Evaluation (Signed)
Anesthesia Post Note  Patient: Terry [name]  Procedure(s) Performed: * No procedures listed *  Patient location during evaluation: Mother Baby Anesthesia Type: Epidural Level of consciousness: awake, awake and alert and patient cooperative Pain management: pain level controlled Vital Signs Assessment: post-procedure vital signs reviewed and stable Respiratory status: spontaneous breathing Cardiovascular status: stable Postop Assessment: no backache, epidural receding, patient able to bend at knees, no signs of nausea or vomiting and adequate PO intake Anesthetic complications: no        Last Vitals:  Vitals:   12/28/16 0634 12/28/16 0730  BP: 127/87 112/78  Pulse: 92 85  Resp: 16 18  Temp: 37.6 C (!) 38.2 C    Last Pain:  Vitals:   12/28/16 0730  TempSrc:   PainSc: 2    Pain Goal:  2 post op               Terry [name] C

## 2016-12-28 NOTE — Progress Notes (Signed)
Patient has a fever 100.8. MD Mullin aware no new orders. Patient told to call out for help if needing to void. Patient told to keep drinking fluids. Patient states nausea has subsided. Patient on flu precautions. Infant in  [place] per Flu protocol. Patient lochia small . Will monitor.

## 2016-12-28 NOTE — Progress Notes (Signed)
Labor Progress Note Terry [name] is a 21 y.o. G1P0000 at 5072w1d presented for labor  S:  Comfortable with epidural, resting.  O:  BP 115/67   Pulse 96   Temp 99.4 F (37.4 C) (Oral)   Resp 16   LMP 03/10/2016 (Exact Date)   SpO2 100%  EFM: baseline 150 bpm/ mod variability/ + accels/ no decels  Toco: 4 SVE: Dilation: (P) 10 Effacement (%): 100 Cervical Position: Anterior Station: -1, -2 Presentation: Vertex Exam by:: (P) Forestine Macho,CNM AROM clear, abundant +blood clot, +heavy bloody show  A/P: 21 y.o. G1P0000 3272w1d  1. Labor: active 2. FWB: Cat I  3. Pain: epidural Suspected abruption but FHR reassuring. Will labor down then start pushing. Anticipate SVD.  Donette LarryMelanie Mackenzi Krogh, CNM 2:01 AM

## 2016-12-28 NOTE — Anesthesia Preprocedure Evaluation (Signed)
Anesthesia Evaluation  Patient identified by MRN, date of birth, ID band Patient awake    Reviewed: Allergy & Precautions, Patient's Chart, lab work & pertinent test results  History of Anesthesia Complications Negative for: history of anesthetic complications  Airway Mallampati: II  TM Distance: >3 FB Neck ROM: Full    Dental  (+) Teeth Intact   Pulmonary neg pulmonary ROS,  breath sounds clear to auscultation        Cardiovascular negative cardio ROS  Rhythm:Regular     Neuro/Psych negative neurological ROS  negative psych ROS   GI/Hepatic negative GI ROS, Neg liver ROS,   Endo/Other  negative endocrine ROS  Renal/GU negative Renal ROS     Musculoskeletal   Abdominal   Peds  Hematology negative hematology ROS (+)   Anesthesia Other Findings   Reproductive/Obstetrics (+) Pregnancy                             Anesthesia Physical Anesthesia Plan  ASA: II  Anesthesia Plan: Epidural   Post-op Pain Management:    Induction:   Airway Management Planned:   Additional Equipment:   Intra-op Plan:   Post-operative Plan:   Informed Consent: I have reviewed the patients History and Physical, chart, labs and discussed the procedure including the risks, benefits and alternatives for the proposed anesthesia with the patient or authorized representative who has indicated his/her understanding and acceptance.     Plan Discussed with: Anesthesiologist  Anesthesia Plan Comments:         Anesthesia Quick Evaluation  

## 2016-12-28 NOTE — Progress Notes (Signed)
Patient states she is feeling a lot better. Patient afebrile at this point and voiding well.

## 2016-12-28 NOTE — Progress Notes (Signed)
ANTIBIOTIC CONSULT NOTE - INITIAL  Pharmacy Consult for Gentamicin Indication: Chorioamnionitis   No Known Allergies  Patient Measurements:   Adjusted Body Weight: 73.8  Vital Signs: Temp: 99.8 F (37.7 C) (02/20 0330) Temp Source: Axillary (02/20 0330) BP: 120/68 (02/20 0330) Pulse Rate: 99 (02/20 0330)  Labs:  Recent Labs  12/27/16 2000  WBC 12.8*  HGB 12.6  PLT 189   No results for input(s): GENTTROUGH, GENTPEAK, GENTRANDOM in the last 72 hours.   Microbiology: No results found for this or any previous visit (from the past 720 hour(s)).  Medications:  Ampicillin 2 Gm IV every 6 hours  Assessment: 21 y.o. female G1P0000 at 6479w1d admitted with contractions; now with increased temperature and presumed chorioamnionitis Estimated Ke = 0.484, Vd = 29.5 Liters  Goal of Therapy:  Gentamicin peak 6-8 mg/L and Trough < 1 mg/L  Plan:  Gentamicin 200 mg IV x 1  Gentamicin 190 mg IV every 8 hrs  Check Scr with next labs if gentamicin continued. Will check gentamicin levels if continued > 72hr or clinically indicated.  Arelia SneddonMason, Dejah Droessler Anne 12/28/2016,3:56 AM

## 2016-12-28 NOTE — Progress Notes (Signed)
Patient tried to void. Ambulated well. Patient told to drink fluids and try to void again in one hour. Patient had a temp of 102.3 Motrin given patient had some crackers. Dr. Ashok PallWouk called to be notified of temp of 102.3 and he stated to continue with motirn and tylenol and to call him at 4pm if patient is still febrile. Will monitor.

## 2016-12-29 MED ORDER — PSEUDOEPHEDRINE HCL ER 120 MG PO TB12
120.0000 mg | ORAL_TABLET | Freq: Two times a day (BID) | ORAL | Status: DC
  Administered 2016-12-29 – 2016-12-30 (×3): 120 mg via ORAL
  Filled 2016-12-29 (×5): qty 1

## 2016-12-29 NOTE — Progress Notes (Signed)
Post Partum Day #1 Subjective: up ad lib, voiding, tolerating PO, + flatus and does report chills.    Objective: Blood pressure 114/65, pulse 63, temperature 97.3 F (36.3 C), temperature source Oral, resp. rate 18, last menstrual period 03/10/2016, SpO2 100 %, unknown if currently breastfeeding.  Physical Exam:  General: alert, cooperative and no distress Lochia: appropriate Uterine Fundus: firm Incision: bilateral labial; healing DVT Evaluation: No evidence of DVT seen on physical exam. No cords or calf tenderness. No significant calf/ankle edema.   Recent Labs  12/27/16 2000  HGB 12.6  HCT 36.1    Assessment/Plan: Contraception Depo injections planned.  +Flu test, on tamiflu.  T-max: 102.8.  Continue current care.    LOS: 2 days   Rachelle A Denney 12/29/2016, 8:45 AM

## 2016-12-30 MED ORDER — SENNOSIDES-DOCUSATE SODIUM 8.6-50 MG PO TABS: 2.0000 | ORAL_TABLET | Freq: Every day | ORAL | 1 refills | Status: DC

## 2016-12-30 MED ORDER — OXYCODONE-ACETAMINOPHEN 5-325 MG PO TABS: 1.0000 | ORAL_TABLET | ORAL | 0 refills | Status: DC | PRN

## 2016-12-30 MED ORDER — ONDANSETRON HCL 4 MG PO TABS: 4.0000 mg | ORAL_TABLET | ORAL | 0 refills | Status: DC | PRN

## 2016-12-30 MED ORDER — OSELTAMIVIR PHOSPHATE 75 MG PO CAPS: 75.0000 mg | ORAL_CAPSULE | Freq: Two times a day (BID) | ORAL | 0 refills | Status: DC

## 2016-12-30 MED ORDER — IBUPROFEN 600 MG PO TABS: 600.0000 mg | ORAL_TABLET | Freq: Four times a day (QID) | ORAL | 2 refills | Status: DC

## 2016-12-30 MED ORDER — PSEUDOEPHEDRINE HCL ER 120 MG PO TB12: 120.0000 mg | ORAL_TABLET | Freq: Two times a day (BID) | ORAL | 0 refills | Status: DC

## 2016-12-30 MED ORDER — HYDROCODONE-HOMATROPINE 5-1.5 MG/5ML PO SYRP: 5.0000 mL | ORAL_SOLUTION | Freq: Four times a day (QID) | ORAL | 0 refills | Status: DC | PRN

## 2016-12-30 MED ORDER — BENZONATATE 100 MG PO CAPS: 200.0000 mg | ORAL_CAPSULE | Freq: Three times a day (TID) | ORAL | 0 refills | Status: DC | PRN

## 2016-12-30 MED ORDER — MEDROXYPROGESTERONE ACETATE 150 MG/ML IM SUSP: 150.0000 mg | INTRAMUSCULAR | 4 refills | Status: DC

## 2016-12-30 NOTE — Progress Notes (Signed)
Post Partum Day #2 Subjective: up ad lib, voiding, tolerating PO, + flatus and reports coughing/sore throat this AM.    Objective: Blood pressure 110/72, pulse (!) 55, temperature 97.7 F (36.5 C), temperature source Oral, resp. rate 18, last menstrual period 03/10/2016, SpO2 100 %, unknown if currently breastfeeding.  Physical Exam:  General: alert, cooperative and no distress Lochia: appropriate Uterine Fundus: firm Incision: none DVT Evaluation: No evidence of DVT seen on physical exam. No cords or calf tenderness. No significant calf/ankle edema.   Recent Labs  12/27/16 2000  HGB 12.6  HCT 36.1    Assessment/Plan: Discharge home and Contraception Depo. Injections planned   LOS: 3 days   Roe CoombsRachelle A Rechelle Niebla, CNM 12/30/2016, 7:37 AM

## 2016-12-30 NOTE — Discharge Summary (Signed)
OB Discharge Summary     Patient Name: Terry [name] DOB: October 30, 1996 MRN: [id number]  Date of admission: 12/27/2016 Delivering MD: Donette Larry   Date of discharge: 12/30/2016  Admitting diagnosis: 40.2WKS CTX Intrauterine pregnancy: [redacted]w[redacted]d     Secondary diagnosis:  Active Problems:   Normal labor  Additional problems: Influenza A with T-max 102.8 Terry [name] is a 21 y.o. female G1P0000 with IUP at [redacted]w[redacted]d presenting for contractions. Pt states she has been having irregular, every unknown minutes contractions, associated with none vaginal bleeding for no hours..  Membranes are intact, with active fetal movement.   [facility] 10 wks Delivery Note At 4:28 AM a viable female was delivered via Vaginal, Spontaneous Delivery (Presentation: OA).  APGAR: 8,9; weight 8+7.   Placenta status: delivered intact.  Cord:  with the following complications: none.  Cord pH: n/a  Anesthesia:   Episiotomy: None Lacerations:  Bilateral labial  Suture Repair: 3.0 vicryl rapide Est. Blood Loss (mL): 200  Boggy LUS and brisk bleeding-Cytotec 800 mcg pr-bleeding improved and uterus firm Mom to postpartum.  Baby to Couplet care / Skin to Skin.  Donette Larry, CNM 12/28/2016, 4:56 AM     Discharge diagnosis: Term Pregnancy Delivered and Influenza A                                                                                                Post partum procedures:none  Augmentation: AROM  Complications: None  Hospital course:  Onset of Labor With Vaginal Delivery     21 y.o. yo G1P1001 at [redacted]w[redacted]d was admitted in Active Labor on 12/27/2016. Patient had an uncomplicated labor course as follows:  Membrane Rupture Time/Date: 1:57 AM ,12/28/2016   Intrapartum Procedures: Episiotomy: None [1]                                         Lacerations:  2nd degree [3];Labial [10]  Patient had a delivery of a Viable infant. 12/28/2016  Information for the patient's newborn:  Terry, [name] [[id number]]  Delivery Method: Vaginal, Spontaneous Delivery (Filed from Delivery Summary)    Pateint had an uncomplicated postpartum course.  She is ambulating, tolerating a regular diet, passing flatus, and urinating well. Patient is discharged home in stable condition on 12/30/16.   Physical exam  Vitals:   12/29/16 0100 12/29/16 0612 12/29/16 1951 12/30/16 0615  BP:  114/65 121/79 110/72  Pulse:  63 91 (!) 55  Resp:  18 18 18   Temp: 98.2 F (36.8 C) 97.3 F (36.3 C) 98.2 F (36.8 C) 97.7 F (36.5 C)  TempSrc: Oral Oral Oral Oral  SpO2:       General: alert, cooperative and no distress Lochia: appropriate Uterine Fundus: firm Incision: N/A DVT Evaluation: No evidence of DVT seen on physical exam. No cords or calf tenderness. No significant calf/ankle edema. Labs: Lab Results  Component Value Date   WBC 12.8 (H) 12/27/2016   HGB 12.6 12/27/2016   HCT 36.1 12/27/2016  MCV 97.6 12/27/2016   PLT 189 12/27/2016   CMP Latest Ref Rng & Units 06/27/2016  Glucose 65 - 99 mg/dL 308(M113(H)  BUN 6 - 20 mg/dL 8  Creatinine 5.780.44 - 4.691.00 mg/dL 6.290.58  Sodium 528135 - 413145 mmol/L 134(L)  Potassium 3.5 - 5.1 mmol/L 3.4(L)  Chloride 101 - 111 mmol/L 103  CO2 22 - 32 mmol/L 24  Calcium 8.9 - 10.3 mg/dL 9.1  Total Protein 6.5 - 8.1 g/dL -  Total Bilirubin 0.3 - 1.2 mg/dL -  Alkaline Phos 38 - 244126 U/L -  AST 15 - 41 U/L -  ALT 14 - 54 U/L -    Discharge instruction: per After Visit Summary and "Baby and Me Booklet".  After visit meds:  Allergies as of 12/30/2016   No Known Allergies     Medication List    TAKE these medications   benzonatate 100 MG capsule Commonly known as:  TESSALON PERLES Take 2 capsules (200 mg total) by mouth 3 (three) times daily as needed for cough.   HYDROcodone-homatropine 5-1.5 MG/5ML syrup Commonly known as:  HYCODAN Take 5 mLs by mouth every 6 (six) hours as needed for cough.   ibuprofen 600 MG tablet Commonly known as:   ADVIL,MOTRIN Take 1 tablet (600 mg total) by mouth every 6 (six) hours.   medroxyPROGESTERone 150 MG/ML injection Commonly known as:  DEPO-PROVERA Inject 1 mL (150 mg total) into the muscle every 3 (three) months. Bring to postpartum exam for administration.   ondansetron 4 MG tablet Commonly known as:  ZOFRAN Take 1 tablet (4 mg total) by mouth every 4 (four) hours as needed for nausea.   oseltamivir 75 MG capsule Commonly known as:  TAMIFLU Take 1 capsule (75 mg total) by mouth 2 (two) times daily.   oxyCODONE-acetaminophen 5-325 MG tablet Commonly known as:  ROXICET Take 1-2 tablets by mouth every 4 (four) hours as needed.   PRENATE PIXIE 10-0.6-0.4-200 MG Caps Take 1 tablet by mouth daily.   pseudoephedrine 120 MG 12 hr tablet Commonly known as:  SUDAFED Take 1 tablet (120 mg total) by mouth 2 (two) times daily.   senna-docusate 8.6-50 MG tablet Commonly known as:  Senokot-S Take 2 tablets by mouth at bedtime.       Diet: routine diet  Activity: Advance as tolerated. Pelvic rest for 6 weeks.   Outpatient follow up:4 weeks Follow up Appt:Future Appointments Date Time Provider Department Center  01/03/2017 7:30 AM [facility] None   Follow up Visit:No Follow-up on file.  Postpartum contraception: Depo Provera  Newborn Data: Live born female  Birth Weight: 8 lb 7.1 oz (3830 g) APGAR: 8, 9  Baby Feeding: Bottle Disposition:home with mother   12/30/2016 Roe Coombsachelle A Eleanor Gatliff, CNM

## 2017-01-03 ENCOUNTER — Inpatient Hospital Stay (HOSPITAL_COMMUNITY): Admission: RE | Admit: 2017-01-03 | Payer: Medicaid Other | Source: Ambulatory Visit

## 2017-01-25 ENCOUNTER — Ambulatory Visit (INDEPENDENT_AMBULATORY_CARE_PROVIDER_SITE_OTHER): Payer: Medicaid Other | Admitting: Obstetrics

## 2017-01-25 ENCOUNTER — Encounter: Payer: Self-pay | Admitting: Obstetrics

## 2017-01-25 DIAGNOSIS — Z3042 Encounter for surveillance of injectable contraceptive: Secondary | ICD-10-CM

## 2017-01-25 DIAGNOSIS — Z3202 Encounter for pregnancy test, result negative: Secondary | ICD-10-CM | POA: Diagnosis not present

## 2017-01-25 MED ORDER — MEDROXYPROGESTERONE ACETATE 150 MG/ML IM SUSP
150.0000 mg | Freq: Once | INTRAMUSCULAR | Status: AC
  Administered 2017-01-25: 150 mg via INTRAMUSCULAR

## 2017-01-25 NOTE — Progress Notes (Signed)
Patient is in office for pp visit, vaginal delivery 12-28-16, bottle feeding. Patient states bleeding has stopped, no complaints. PP depression score= 5.

## 2017-01-26 ENCOUNTER — Encounter: Payer: Self-pay | Admitting: Obstetrics

## 2017-01-26 NOTE — Progress Notes (Signed)
Subjective:     Terry [name] is a 21 y.o. female who presents for a postpartum visit. She is 4 weeks postpartum following a spontaneous vaginal delivery. I have fully reviewed the prenatal and intrapartum course. The delivery was at 40 gestational weeks. Outcome: spontaneous vaginal delivery. Anesthesia: epidural. Postpartum course has been normal. Baby's course has been normal. Baby is feeding by bottle - Similac Advance. Bleeding no bleeding. Bowel function is normal. Bladder function is normal. Patient is sexually active. Contraception method is none. Postpartum depression screening: negative.  Tobacco, alcohol and substance abuse history reviewed.  Adult immunizations reviewed including TDAP, rubella and varicella.  The following portions of the patient's history were reviewed and updated as appropriate: allergies, current medications, past family history, past medical history, past social history, past surgical history and problem list.  Review of Systems A comprehensive review of systems was negative.   Objective:    BP 117/84   Pulse 99   Wt 139 lb 6.4 oz (63.2 kg)   Breastfeeding? No   BMI 20.00 kg/m    PE:        General:  Alert and no distress      Lungs:  Clear      Heart:  RRR      Breasts:  Soft, NT      Abdomen:  Soft, NT.  No organomegaly.      Pelvic:  Deferred   >50% of 15 min visit spent on counseling and coordination of care.    Assessment:    4 weeks postpartum.  Doing well.  Contraceptive Counseling and Advice.  Wants Depo Provera.  Plan:    1. Contraception: Depo-Provera injections 2. Depo Provera Rx 3. Follow up in: 8 weeks or as needed.  2hr GTT for h/o GDM/screening for DM q 3 yrs per ADA recommendations Preconception counseling provided Healthy lifestyle practices reviewed

## 2017-03-30 ENCOUNTER — Ambulatory Visit: Payer: Medicaid Other

## 2017-04-14 ENCOUNTER — Ambulatory Visit: Payer: Medicaid Other

## 2017-05-18 ENCOUNTER — Encounter: Payer: Self-pay | Admitting: Certified Nurse Midwife

## 2017-05-30 ENCOUNTER — Encounter (HOSPITAL_COMMUNITY): Payer: Self-pay | Admitting: Emergency Medicine

## 2017-05-30 ENCOUNTER — Emergency Department (HOSPITAL_COMMUNITY)
Admission: EM | Admit: 2017-05-30 | Discharge: 2017-05-30 | Disposition: A | Discharge status: Home / Self Care | Payer: Medicaid Other | Attending: Emergency Medicine | Admitting: Emergency Medicine

## 2017-05-30 DIAGNOSIS — Z5321 Procedure and treatment not carried out due to patient leaving prior to being seen by health care provider: Secondary | ICD-10-CM | POA: Insufficient documentation

## 2017-05-30 DIAGNOSIS — Z202 Contact with and (suspected) exposure to infections with a predominantly sexual mode of transmission: Secondary | ICD-10-CM | POA: Insufficient documentation

## 2017-05-30 LAB — URINALYSIS, ROUTINE W REFLEX MICROSCOPIC
Bilirubin Urine: NEGATIVE
Glucose, UA: NEGATIVE mg/dL
Hgb urine dipstick: NEGATIVE
Ketones, ur: NEGATIVE mg/dL
Leukocytes, UA: NEGATIVE
Nitrite: NEGATIVE
PH: 5 (ref 5.0–8.0)
Protein, ur: 30 mg/dL — AB
SPECIFIC GRAVITY, URINE: 1.018 (ref 1.005–1.030)

## 2017-05-30 NOTE — ED Notes (Signed)
Called for rooming, pt did not respond.

## 2017-05-30 NOTE — ED Notes (Signed)
Patient called for reassessment at 2337, no response. Pt is not seen in the lobby at this time.

## 2017-05-30 NOTE — ED Notes (Signed)
Pt presents to ED for possible exposure to Gonorrhea, also c/o discharge, burning with urination

## 2017-06-14 ENCOUNTER — Encounter (HOSPITAL_COMMUNITY): Payer: Self-pay | Admitting: Emergency Medicine

## 2017-06-14 ENCOUNTER — Ambulatory Visit (HOSPITAL_COMMUNITY)
Admission: EM | Admit: 2017-06-14 | Discharge: 2017-06-14 | Disposition: A | Discharge status: Home / Self Care | Payer: Medicaid Other | Attending: Family Medicine | Admitting: Family Medicine

## 2017-06-14 DIAGNOSIS — Z3202 Encounter for pregnancy test, result negative: Secondary | ICD-10-CM

## 2017-06-14 DIAGNOSIS — N760 Acute vaginitis: Secondary | ICD-10-CM

## 2017-06-14 DIAGNOSIS — B9689 Other specified bacterial agents as the cause of diseases classified elsewhere: Secondary | ICD-10-CM

## 2017-06-14 DIAGNOSIS — N898 Other specified noninflammatory disorders of vagina: Secondary | ICD-10-CM

## 2017-06-14 LAB — POCT URINALYSIS DIP (DEVICE)
Bilirubin Urine: NEGATIVE
GLUCOSE, UA: NEGATIVE mg/dL
Hgb urine dipstick: NEGATIVE
KETONES UR: NEGATIVE mg/dL
LEUKOCYTES UA: NEGATIVE
Nitrite: NEGATIVE
PH: 7 (ref 5.0–8.0)
Protein, ur: NEGATIVE mg/dL
Specific Gravity, Urine: 1.01 (ref 1.005–1.030)
Urobilinogen, UA: 1 mg/dL (ref 0.0–1.0)

## 2017-06-14 LAB — POCT PREGNANCY, URINE: Preg Test, Ur: NEGATIVE

## 2017-06-14 MED ORDER — METRONIDAZOLE 500 MG PO TABS: 500.0000 mg | ORAL_TABLET | Freq: Two times a day (BID) | ORAL | 0 refills | Status: DC

## 2017-06-14 NOTE — ED Triage Notes (Signed)
Pt reports a white thick vaginal discharge x3 days, with an odor that started today and some minor itching.

## 2017-06-14 NOTE — ED Provider Notes (Signed)
  [facility]   [id number] 06/14/17 Arrival Time: 1025  ASSESSMENT & PLAN:  Today you were diagnosed with the following: 1. Bacterial vaginosis     Meds ordered this encounter  Medications  . metroNIDAZOLE (FLAGYL) 500 MG tablet    Sig: Take 1 tablet (500 mg total) by mouth 2 (two) times daily.    Dispense:  14 tablet    Refill:  0   Urine cytology sent to confirm BV and r/o other causes.  Reviewed expectations re: course of current medical issues. Questions answered. Outlined signs and symptoms indicating need for more acute intervention. Patient verbalized understanding. After Visit Summary given.   SUBJECTIVE:  Terry [name] is a 21 y.o. female who presents with complaint of vaginal discharge for 3 days. H/O BV in past and symptoms are the same. Describes thin discharge that is whitish/gray in color. No pelvic pain. No specific urinary frequency or dysuria. Afebrile. Sexually active with one partner. No abdominal symptoms. No OTC treatment.  ROS: As per HPI.   OBJECTIVE:  Vitals:   06/14/17 1105  BP: 110/67  Pulse: 64  Temp: 98.3 F (36.8 C)  TempSrc: Oral  SpO2: 100%     General appearance: alert, cooperative, appears stated age and no distress Throat: lips, mucosa, and tongue normal; teeth and gums normal Back: no CVA tenderness Abdomen: soft, non-tender; bowel sounds normal; no masses or organomegaly; no guarding or rebound tenderness GU: declines Skin: warm and dry Psychological:  Alert and cooperative. Normal mood and affect.  Results for orders placed or performed during the hospital encounter of 06/14/17  POCT urinalysis dip (device)  Result Value Ref Range   Glucose, UA NEGATIVE NEGATIVE mg/dL   Bilirubin Urine NEGATIVE NEGATIVE   Ketones, ur NEGATIVE NEGATIVE mg/dL   Specific Gravity, Urine 1.010 1.005 - 1.030   Hgb urine dipstick NEGATIVE NEGATIVE   pH 7.0 5.0 - 8.0   Protein, ur NEGATIVE NEGATIVE mg/dL   Urobilinogen, UA 1.0 0.0  - 1.0 mg/dL   Nitrite NEGATIVE NEGATIVE   Leukocytes, UA NEGATIVE NEGATIVE  Pregnancy, urine POC  Result Value Ref Range   Preg Test, Ur NEGATIVE NEGATIVE    Labs Reviewed  POCT URINALYSIS DIP (DEVICE)  POCT PREGNANCY, URINE  URINE CYTOLOGY ANCILLARY ONLY    No Known Allergies  PMHx, SurgHx, SocialHx, Medications, and Allergies were reviewed in the Visit Navigator and updated as appropriate.       Mardella LaymanHagler, Rondell Frick, MD 06/14/17 [phone]

## 2017-06-15 LAB — URINE CYTOLOGY ANCILLARY ONLY
CHLAMYDIA, DNA PROBE: NEGATIVE
Neisseria Gonorrhea: NEGATIVE
Trichomonas: NEGATIVE

## 2017-06-20 LAB — URINE CYTOLOGY ANCILLARY ONLY: CANDIDA VAGINITIS: NEGATIVE

## 2017-07-06 ENCOUNTER — Encounter (HOSPITAL_COMMUNITY): Payer: Self-pay

## 2017-07-06 DIAGNOSIS — M791 Myalgia: Secondary | ICD-10-CM | POA: Insufficient documentation

## 2017-07-06 DIAGNOSIS — Y929 Unspecified place or not applicable: Secondary | ICD-10-CM | POA: Insufficient documentation

## 2017-07-06 DIAGNOSIS — Y998 Other external cause status: Secondary | ICD-10-CM | POA: Insufficient documentation

## 2017-07-06 DIAGNOSIS — Y939 Activity, unspecified: Secondary | ICD-10-CM | POA: Insufficient documentation

## 2017-07-06 DIAGNOSIS — Z79899 Other long term (current) drug therapy: Secondary | ICD-10-CM | POA: Insufficient documentation

## 2017-07-06 DIAGNOSIS — S022XXA Fracture of nasal bones, initial encounter for closed fracture: Secondary | ICD-10-CM | POA: Insufficient documentation

## 2017-07-06 DIAGNOSIS — R51 Headache: Secondary | ICD-10-CM | POA: Insufficient documentation

## 2017-07-06 DIAGNOSIS — T07XXXA Unspecified multiple injuries, initial encounter: Secondary | ICD-10-CM | POA: Insufficient documentation
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
## 2017-07-07 ENCOUNTER — Emergency Department (HOSPITAL_COMMUNITY)
Admission: EM | Admit: 2017-07-07 | Discharge: 2017-07-07 | Disposition: A | Discharge status: Home / Self Care | Payer: Self-pay | Attending: Emergency Medicine | Admitting: Emergency Medicine

## 2017-07-07 ENCOUNTER — Emergency Department (HOSPITAL_COMMUNITY): Payer: Self-pay

## 2017-07-07 DIAGNOSIS — S022XXA Fracture of nasal bones, initial encounter for closed fracture: Secondary | ICD-10-CM

## 2017-07-07 DIAGNOSIS — M7918 Myalgia, other site: Secondary | ICD-10-CM

## 2017-07-07 LAB — POC URINE PREG, ED: Preg Test, Ur: NEGATIVE

## 2017-07-07 MED ORDER — NAPROXEN 500 MG PO TABS: 500.0000 mg | ORAL_TABLET | Freq: Two times a day (BID) | ORAL | 0 refills | Status: DC

## 2017-07-07 MED ORDER — IBUPROFEN 800 MG PO TABS
800.0000 mg | ORAL_TABLET | Freq: Once | ORAL | Status: AC
  Administered 2017-07-07: 800 mg via ORAL
  Filled 2017-07-07: qty 1

## 2017-07-07 NOTE — ED Notes (Signed)
C/o headache

## 2017-07-07 NOTE — Discharge Instructions (Signed)
Take naproxen twice a day. Do not take other anti-inflammatories at the same time (Advil, ibuprofen, Motrin, Aleve). You may supplement with Tylenol as needed. Use ice packs or heating packs for pain control. Ice packs around your nose and jaw will help with swelling. Follow-up with your primary care doctor in 1 week for reevaluation of your symptoms. Return to the emergency room you develop vision changes, vomiting, worsening pain, or any new or worsening symptoms.
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
## 2017-07-07 NOTE — ED Notes (Signed)
Patient transported to X-ray 

## 2017-09-10 ENCOUNTER — Encounter (HOSPITAL_COMMUNITY): Payer: Self-pay | Admitting: *Deleted

## 2017-09-10 ENCOUNTER — Inpatient Hospital Stay (HOSPITAL_COMMUNITY)
Admission: AD | Admit: 2017-09-10 | Discharge: 2017-09-10 | Disposition: A | Discharge status: Home / Self Care | Payer: Medicaid Other | Source: Ambulatory Visit | Attending: Obstetrics and Gynecology | Admitting: Obstetrics and Gynecology

## 2017-09-10 DIAGNOSIS — N938 Other specified abnormal uterine and vaginal bleeding: Secondary | ICD-10-CM

## 2017-09-10 DIAGNOSIS — R102 Pelvic and perineal pain: Secondary | ICD-10-CM | POA: Diagnosis present

## 2017-09-10 DIAGNOSIS — N941 Unspecified dyspareunia: Secondary | ICD-10-CM

## 2017-09-10 DIAGNOSIS — N939 Abnormal uterine and vaginal bleeding, unspecified: Secondary | ICD-10-CM | POA: Diagnosis present

## 2017-09-10 DIAGNOSIS — Z113 Encounter for screening for infections with a predominantly sexual mode of transmission: Secondary | ICD-10-CM

## 2017-09-10 LAB — WET PREP, GENITAL
Clue Cells Wet Prep HPF POC: NONE SEEN
Sperm: NONE SEEN
Trich, Wet Prep: NONE SEEN
Yeast Wet Prep HPF POC: NONE SEEN

## 2017-09-10 LAB — URINALYSIS, ROUTINE W REFLEX MICROSCOPIC
Bilirubin Urine: NEGATIVE
GLUCOSE, UA: NEGATIVE mg/dL
HGB URINE DIPSTICK: NEGATIVE
KETONES UR: NEGATIVE mg/dL
Leukocytes, UA: NEGATIVE
Nitrite: NEGATIVE
PROTEIN: NEGATIVE mg/dL
Specific Gravity, Urine: 1.03 (ref 1.005–1.030)
pH: 6 (ref 5.0–8.0)

## 2017-09-10 LAB — POCT PREGNANCY, URINE: Preg Test, Ur: NEGATIVE

## 2017-09-10 MED ORDER — CEFTRIAXONE SODIUM 1 G IJ SOLR
1.0000 g | Freq: Once | INTRAMUSCULAR | Status: AC
  Administered 2017-09-10: 1 g via INTRAMUSCULAR
  Filled 2017-09-10: qty 10

## 2017-09-10 MED ORDER — AZITHROMYCIN 250 MG PO TABS
1000.0000 mg | ORAL_TABLET | Freq: Once | ORAL | Status: AC
  Administered 2017-09-10: 1000 mg via ORAL
  Filled 2017-09-10: qty 4

## 2017-09-10 NOTE — MAU Provider Note (Signed)
History   21 yo female in with pain during and after intercourse. States this has been going on about two weeks. States missed DMPA in June and has spotting off and on.  CSN: [id number]  Arrival date & time 09/10/17  0945   None     Chief Complaint  Patient presents with  . Vaginal Pain  . Vaginal Bleeding    HPI  Past Medical History:  Diagnosis Date  . Chlamydia   . Gonorrhea     Past Surgical History:  Procedure Laterality Date  . NO PAST SURGERIES      Family History  Problem Relation Age of Onset  . Cancer Maternal Grandmother        pancreatic  . Diabetes Maternal Uncle   . Diabetes Mother   . Hypertension Mother     Social History  Substance Use Topics  . Smoking status: Never Smoker  . Smokeless tobacco: Never Used  . Alcohol use No    OB History    Gravida Para Term Preterm AB Living   1 1 1    0 1   SAB TAB Ectopic Multiple Live Births   0     0 1      Review of Systems  Constitutional: Negative.   HENT: Negative.   Eyes: Negative.   Respiratory: Negative.   Cardiovascular: Negative.   Gastrointestinal: Positive for abdominal pain.  Endocrine: Negative.   Genitourinary: Positive for dyspareunia.  Musculoskeletal: Negative.   Skin: Negative.   Allergic/Immunologic: Negative.   Neurological: Negative.   Hematological: Negative.   Psychiatric/Behavioral: Negative.     Allergies  Patient has no known allergies.  Home Medications    BP 99/81 (BP Location: Left Arm)   Pulse (!) 102   Temp 98.2 F (36.8 C) (Oral)   Resp 16   Ht 5\' 11"  (1.803 m)   Wt 129 lb 4 oz (58.6 kg)   SpO2 100%   BMI 18.03 kg/m   Physical Exam  Constitutional: She is oriented to person, place, and time. She appears well-developed and well-nourished.  HENT:  Head: Normocephalic.  Neck: Normal range of motion.  Cardiovascular: Normal rate, regular rhythm, normal heart sounds and intact distal pulses.   Pulmonary/Chest: Effort normal and breath sounds  normal.  Abdominal: Soft. Bowel sounds are normal.  Genitourinary:  Genitourinary Comments: Tenderness with exam  Musculoskeletal: Normal range of motion.  Neurological: She is alert and oriented to person, place, and time. She has normal reflexes.  Skin: Skin is warm and dry.  Psychiatric: She has a normal mood and affect. Her behavior is normal. Judgment and thought content normal.    MAU Course  Procedures (including critical care time)  Labs Reviewed  WET PREP, GENITAL  URINALYSIS, ROUTINE W REFLEX MICROSCOPIC  POCT PREGNANCY, URINE  GC/CHLAMYDIA PROBE AMP ([place]) NOT AT [facility]   No results found.   No diagnosis found.    MDM  VSS, sterile spec exam done wet prep . Chla and GC obtained. Cervix smooth pink no lesions noted but CMT noted with exam. Also pelvic tenderness noted with bimanual exam.  Lengthy discussin with pt regarding tx options given the fact that she has CMT and prev hx of GC and chla I will treat and d/c home

## 2017-09-10 NOTE — MAU Note (Addendum)
Patient states that she has been having vaginal pain and bleeding before and after intercourse for several weeks. She denies any other complications at this time. Patient states that she has not taken anything to relieve the pain.

## 2017-09-10 NOTE — Discharge Instructions (Signed)
Dysfunctional Uterine Bleeding Dysfunctional uterine bleeding is abnormal bleeding from the uterus. Dysfunctional uterine bleeding includes:  A period that comes earlier or later than usual.  A period that is lighter, heavier, or has blood clots.  Bleeding between periods.  Skipping one or more periods.  Bleeding after sexual intercourse.  Bleeding after menopause.  Follow these instructions at home: Pay attention to any changes in your symptoms. Follow these instructions to help with your condition: Eating and drinking  Eat well-balanced meals. Include foods that are high in iron, such as liver, meat, shellfish, green leafy vegetables, and eggs.  If you become constipated: ? Drink plenty of water. ? Eat fruits and vegetables that are high in water and fiber, such as spinach, carrots, raspberries, apples, and mango. Medicines  Take over-the-counter and prescription medicines only as told by your health care provider.  Do not change medicines without talking with your health care provider.  Aspirin or medicines that contain aspirin may make the bleeding worse. Do not take those medicines: ? During the week before your period. ? During your period.  If you were prescribed iron pills, take them as told by your health care provider. Iron pills help to replace iron that your body loses because of this condition. Activity  If you need to change your sanitary pad or tampon more than one time every 2 hours: ? Lie in bed with your feet raised (elevated). ? Place a cold pack on your lower abdomen. ? Rest as much as possible until the bleeding stops or slows down.  Do not try to lose weight until the bleeding has stopped and your blood iron level is back to normal. Other Instructions  For two months, write down: ? When your period starts. ? When your period ends. ? When any abnormal bleeding occurs. ? What problems you notice.  Keep all follow up visits as told by your health  care provider. This is important. Contact a health care provider if:  You get light-headed or weak.  You have nausea and vomiting.  You cannot eat or drink without vomiting.  You feel dizzy or have diarrhea while you are taking medicines.  You are taking birth control pills or hormones, and you want to change them or stop taking them. Get help right away if:  You develop a fever or chills.  You need to change your sanitary pad or tampon more than one time per hour.  Your bleeding becomes heavier, or your flow contains clots more often.  You develop pain in your abdomen.  You lose consciousness.  You develop a rash. This information is not intended to replace advice given to you by your health care provider. Make sure you discuss any questions you have with your health care provider. Document Released: 10/22/2000 Document Revised: 04/01/2016 Document Reviewed: 01/20/2015 Elsevier Interactive Patient Education  2018 [employer].   Dyspareunia, Female Dyspareunia is pain that is associated with sexual activity. This can affect any part of the genitals or lower abdomen, and there are many possible causes. This condition ranges from mild to severe. Depending on the cause, dyspareunia may get better with treatment, or it may return (recur) over time. What are the causes? The cause of this condition is not always known. Possible causes include:  Cancer.  Psychological factors, such as depression, anxiety, or previous traumatic experiences.  Severe pain and tenderness of the skin around the vagina (vulva) when it is touched (vulvar vestibulitis syndrome).  Infection of the pelvis  or the vulva.  Infection of the vagina.  Painful, involuntary tightening (contraction) of the vaginal muscles when anything is put inside the vagina (vaginismus).  Allergic reaction.  Ovarian cysts.  Solid growths of tissue (tumors) in the ovaries or the uterus.  Scar tissue in the ovaries,  vagina, or pelvis.  Vaginal dryness.  Thinning of the tissue (atrophy) of the vulva and vagina.  Skin conditions that affect the vulva (vulvar dermatoses), such as lichen sclerosus or lichen planus.  Endometriosis.  Tubal pregnancy.  A tilted uterus.  Uterine prolapse.  Adhesions in the vagina.  Bladder problems.  Intestinal problems.  Certain medicines.  Medical conditions such as diabetes, arthritis, or thyroid disease.  What increases the risk? The following factors may make you more likely to develop this condition:  Having experienced physical or sexual trauma.  Having given birth more than once.  Taking birth control pills.  Having gone through menopause.  Having recently given birth, typically within the past 3-6 months.  Breastfeeding.  What are the signs or symptoms? The main symptom of this condition is pain in any part of the genitals or lower abdomen during or after sexual activity. This may include pain during sexual arousal, genital stimulation, or orgasm. Pain may get worse when anything is inserted into the vagina, or when the genitals are touched in any way, such as when sitting or wearing pants. Pain can range from mild to severe, depending on the cause of the condition. In some cases, symptoms go away with treatment and return (recur) at a later date. How is this diagnosed? This condition may be diagnosed based on:  Your symptoms, including: ? Where your pain is located. ? When your pain occurs.  Your medical history.  A physical exam. This may include a pelvic exam and a Pap test. This is a screening test that is used to check for signs of cancer of the vagina, cervix, and uterus.  Tests, including: ? Blood tests. ? Ultrasound. This uses sound waves to make a picture of the area that is being tested. ? Urine culture. This test involves checking a urine sample for signs of infection. ? Culture test. This is when your health care provider  uses a swab to collect a sample of vaginal fluid. The sample is checked for signs of infection. ? X-rays. ? MRI. ? CT scan. ? Laparoscopy. This is a procedure in which a small incision is made in your lower abdomen and a lighted, pencil-sized instrument (laparoscope) is passed through the incision and used to look inside your pelvis.  You may be referred to a health care provider who specializes in women's health (gynecologist). In some cases, diagnosing the cause of dyspareunia can be difficult. How is this treated? Treatment depends on the cause of your condition and your symptoms. In most cases, you may need to stop sexual activity until your symptoms improve. Treatment may include:  Lubricants.  Kegel exercises or vaginal dilators.  Medicated skin creams.  Medicated vaginal creams.  Hormonal therapy.  Antibiotic medicine to prevent or fight infection.  Medicines that help to relieve pain.  Medicines that treat depression (antidepressants).  Psychological counseling.  Sex therapy.  Surgery.  Follow these instructions at home: Lifestyle  Avoid tight clothing and irritating materials around your genital and abdominal area.  Use water-based lubricants as needed. Avoid oil-based lubricants.  Do not use any products that irritate you. This may include certain condoms, spermicides, lubricants, soaps, tampons, vaginal sprays, or douches.  Always practice safe sex. Talk with your health care provider about which form of birth control (contraception) is best for you.  Maintain open communication with your sexual partner. General instructions  Take over-the-counter and prescription medicines only as told by your health care provider.  If you had tests done, it is your responsibility to get your tests results. Ask your health care provider or the department performing the test when your results will be ready.  Urinate before you engage in sexual activity.  Consider joining  a support group.  Keep all follow-up visits as told by your health care provider. This is important. Contact a health care provider if:  You develop vaginal bleeding after sexual intercourse.  You develop a lump at the opening of your vagina. Seek medical care even if the lump is painless.  You have: ? Abnormal vaginal discharge. ? Vaginal dryness. ? Itchiness or irritation of your vulva or vagina. ? A new rash. ? Symptoms that get worse or do not improve with treatment. ? A fever. ? Pain when you urinate. ? Blood in your urine. Get help right away if:  You develop severe pain in your abdomen during or shortly after sexual intercourse.  You pass out after having sexual intercourse. This information is not intended to replace advice given to you by your health care provider. Make sure you discuss any questions you have with your health care provider. Document Released: 11/14/2007 Document Revised: 03/05/2016 Document Reviewed: 05/27/2015 Elsevier Interactive Patient Education  [employer].

## 2017-09-12 LAB — GC/CHLAMYDIA PROBE AMP (~~LOC~~) NOT AT ARMC
CHLAMYDIA, DNA PROBE: NEGATIVE
Neisseria Gonorrhea: NEGATIVE

## 2018-08-29 ENCOUNTER — Emergency Department (HOSPITAL_COMMUNITY)
Admission: EM | Admit: 2018-08-29 | Discharge: 2018-08-29 | Disposition: A | Discharge status: Home / Self Care | Payer: No Typology Code available for payment source | Attending: Emergency Medicine | Admitting: Emergency Medicine

## 2018-08-29 ENCOUNTER — Other Ambulatory Visit: Payer: Self-pay

## 2018-08-29 ENCOUNTER — Emergency Department (HOSPITAL_COMMUNITY): Payer: No Typology Code available for payment source

## 2018-08-29 ENCOUNTER — Encounter (HOSPITAL_COMMUNITY): Payer: Self-pay | Admitting: *Deleted

## 2018-08-29 DIAGNOSIS — G44309 Post-traumatic headache, unspecified, not intractable: Secondary | ICD-10-CM | POA: Diagnosis not present

## 2018-08-29 DIAGNOSIS — F0781 Postconcussional syndrome: Secondary | ICD-10-CM | POA: Diagnosis not present

## 2018-08-29 DIAGNOSIS — R42 Dizziness and giddiness: Secondary | ICD-10-CM | POA: Diagnosis not present

## 2018-08-29 DIAGNOSIS — Z79899 Other long term (current) drug therapy: Secondary | ICD-10-CM | POA: Insufficient documentation

## 2018-08-29 LAB — CBC
HCT: 42.8 % (ref 36.0–46.0)
Hemoglobin: 13.5 g/dL (ref 12.0–15.0)
MCH: 31.5 pg (ref 26.0–34.0)
MCHC: 31.5 g/dL (ref 30.0–36.0)
MCV: 100 fL (ref 80.0–100.0)
PLATELETS: 314 10*3/uL (ref 150–400)
RBC: 4.28 MIL/uL (ref 3.87–5.11)
RDW: 11.9 % (ref 11.5–15.5)
WBC: 8.3 10*3/uL (ref 4.0–10.5)
nRBC: 0 % (ref 0.0–0.2)

## 2018-08-29 LAB — BASIC METABOLIC PANEL
ANION GAP: 6 (ref 5–15)
BUN: 8 mg/dL (ref 6–20)
CO2: 26 mmol/L (ref 22–32)
Calcium: 9.2 mg/dL (ref 8.9–10.3)
Chloride: 106 mmol/L (ref 98–111)
Creatinine, Ser: 0.78 mg/dL (ref 0.44–1.00)
GFR calc Af Amer: >60 mL/min (ref >60)
GLUCOSE: 100 mg/dL — AB (ref 70–99)
POTASSIUM: 3.9 mmol/L (ref 3.5–5.1)
Sodium: 138 mmol/L (ref 135–145)

## 2018-08-29 LAB — I-STAT BETA HCG BLOOD, ED (MC, WL, AP ONLY)
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
## 2018-08-29 NOTE — ED Notes (Signed)
Patient verbalizes understanding of discharge instructions. Opportunity for questioning and answers were provided. Armband removed by staff, pt discharged from ED home via POV.  
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
## 2018-08-29 NOTE — Discharge Instructions (Addendum)
Please read attached information. If you experience any new or worsening signs or symptoms please return to the emergency room for evaluation. Please follow-up with your primary care provider or specialist as discussed.  °
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
## 2018-12-23 ENCOUNTER — Inpatient Hospital Stay (HOSPITAL_COMMUNITY)
Admission: AD | Admit: 2018-12-23 | Discharge: 2018-12-23 | Disposition: A | Discharge status: Home / Self Care | Payer: Self-pay | Source: Ambulatory Visit | Attending: Obstetrics and Gynecology | Admitting: Obstetrics and Gynecology

## 2018-12-23 ENCOUNTER — Encounter (HOSPITAL_COMMUNITY): Payer: Self-pay | Admitting: *Deleted

## 2018-12-23 DIAGNOSIS — B3731 Acute candidiasis of vulva and vagina: Secondary | ICD-10-CM

## 2018-12-23 DIAGNOSIS — B373 Candidiasis of vulva and vagina: Secondary | ICD-10-CM | POA: Insufficient documentation

## 2018-12-23 DIAGNOSIS — Z3202 Encounter for pregnancy test, result negative: Secondary | ICD-10-CM | POA: Insufficient documentation

## 2018-12-23 LAB — URINALYSIS, ROUTINE W REFLEX MICROSCOPIC
GLUCOSE, UA: NEGATIVE mg/dL
HGB URINE DIPSTICK: NEGATIVE
KETONES UR: 15 mg/dL — AB
LEUKOCYTE UA: NEGATIVE
Nitrite: NEGATIVE
PROTEIN: NEGATIVE mg/dL
Specific Gravity, Urine: 1.025 (ref 1.005–1.030)
pH: 5.5 (ref 5.0–8.0)

## 2018-12-23 LAB — WET PREP, GENITAL
Clue Cells Wet Prep HPF POC: NONE SEEN
Sperm: NONE SEEN
Trich, Wet Prep: NONE SEEN
Yeast Wet Prep HPF POC: NONE SEEN

## 2018-12-23 LAB — POCT PREGNANCY, URINE: PREG TEST UR: NEGATIVE

## 2018-12-23 MED ORDER — FLUCONAZOLE 150 MG PO TABS: 150.0000 mg | ORAL_TABLET | Freq: Once | ORAL | 1 refills | Status: AC

## 2018-12-23 NOTE — MAU Provider Note (Signed)
History     CSN: [id number]  Arrival date and time: 12/23/18 1559   First Provider Initiated Contact with Patient 12/23/18 1713      Chief Complaint  Patient presents with  . vaginal discomfort   HPI Terry [name] is a 23 y.o. G1P1001 non pregnant female who presents with vaginal irritation. She states there is pain externally but no discharge. She states it started after intercourse. She reports feeling like she cannot sit down because of the irritation.   OB History    Gravida  1   Para  1   Term  1   Preterm      AB  0   Living  1     SAB  0   TAB      Ectopic      Multiple  0   Live Births  1           Past Medical History:  Diagnosis Date  . Chlamydia   . Gonorrhea     Past Surgical History:  Procedure Laterality Date  . NO PAST SURGERIES      Family History  Problem Relation Age of Onset  . Cancer Maternal Grandmother        pancreatic  . Diabetes Maternal Uncle   . Diabetes Mother   . Hypertension Mother     Social History   Tobacco Use  . Smoking status: Never Smoker  . Smokeless tobacco: Never Used  Substance Use Topics  . Alcohol use: No  . Drug use: No    Allergies: No Known Allergies  Medications Prior to Admission  Medication Sig Dispense Refill Last Dose  . ibuprofen (ADVIL,MOTRIN) 600 MG tablet Take 1 tablet (600 mg total) by mouth every 6 (six) hours. 120 tablet 2 Past Month at Unknown time  . Prenat-FeAsp-Meth-FA-DHA w/o A (PRENATE PIXIE) 10-0.6-0.4-200 MG CAPS Take 1 tablet by mouth daily. 30 capsule 12 Taking    Review of Systems  Constitutional: Negative.  Negative for fatigue and fever.  HENT: Negative.   Respiratory: Negative.  Negative for shortness of breath.   Cardiovascular: Negative.  Negative for chest pain.  Gastrointestinal: Negative.  Negative for abdominal pain, constipation, diarrhea, nausea and vomiting.  Genitourinary: Positive for vaginal pain. Negative for dysuria, vaginal bleeding and  vaginal discharge.  Neurological: Negative.  Negative for dizziness and headaches.   Physical Exam   Blood pressure 125/83, pulse 92, temperature 98.2 F (36.8 C), temperature source Oral, resp. rate 18, weight 62.2 kg, last menstrual period 12/12/2018, SpO2 99 %, not currently breastfeeding.  Physical Exam  Nursing note and vitals reviewed. Constitutional: She is oriented to person, place, and time. She appears well-developed and well-nourished. No distress.  HENT:  Head: Normocephalic.  Eyes: Pupils are equal, round, and reactive to light.  Cardiovascular: Normal rate, regular rhythm and normal heart sounds.  Respiratory: Effort normal and breath sounds normal. No respiratory distress.  GI: Soft. Bowel sounds are normal. She exhibits no distension. There is no abdominal tenderness.  Genitourinary: There is rash and tenderness on the right labia. There is rash and tenderness on the left labia.    Genitourinary Comments: Redness and irritation noted along external labia. Pelvic exam: Cervix pink, visually closed, without lesion, scant white creamy discharge, vaginal walls and external genitalia normal Bimanual exam: Cervix 0/long/high, firm, anterior, neg CMT, uterus nontender, nonenlarged, adnexa without tenderness, enlargement, or mass    Neurological: She is alert and oriented to person, place, and time.  Skin: Skin is warm and dry.  Psychiatric: She has a normal mood and affect. Her behavior is normal. Judgment and thought content normal.    MAU Course  Procedures Results for orders placed or performed during the hospital encounter of 12/23/18 (from the past 24 hour(s))  Urinalysis, Routine w reflex microscopic     Status: Abnormal   Collection Time: 12/23/18  4:26 PM  Result Value Ref Range   Color, Urine YELLOW YELLOW   APPearance CLEAR CLEAR   Specific Gravity, Urine 1.025 1.005 - 1.030   pH 5.5 5.0 - 8.0   Glucose, UA NEGATIVE NEGATIVE mg/dL   Hgb urine dipstick NEGATIVE  NEGATIVE   Bilirubin Urine SMALL (A) NEGATIVE   Ketones, ur 15 (A) NEGATIVE mg/dL   Protein, ur NEGATIVE NEGATIVE mg/dL   Nitrite NEGATIVE NEGATIVE   Leukocytes,Ua NEGATIVE NEGATIVE  Pregnancy, urine POC     Status: None   Collection Time: 12/23/18  4:28 PM  Result Value Ref Range   Preg Test, Ur NEGATIVE NEGATIVE  Wet prep, genital     Status: Abnormal   Collection Time: 12/23/18  5:19 PM  Result Value Ref Range   Yeast Wet Prep HPF POC NONE SEEN NONE SEEN   Trich, Wet Prep NONE SEEN NONE SEEN   Clue Cells Wet Prep HPF POC NONE SEEN NONE SEEN   WBC, Wet Prep HPF POC FEW (A) NONE SEEN   Sperm NONE SEEN    MDM UA, UPT Wet prep and gc/chlamydia  Clinical presentation consistent with external yeast infection  Assessment and Plan   1. Candidiasis of vulva and vagina    -Discharge home in stable condition -Rx for diflucan sent to patient's pharmacy -STI precautions discussed -Patient advised to follow-up with gyn of choice for routine needs -Patient may return to MAU as needed or if her condition were to change or worsen   Rolm Bookbinder CNM 12/23/2018, 5:13 PM

## 2018-12-23 NOTE — Discharge Instructions (Signed)
the [facility] will be moving to the [facility] campus. At that time, the MAU (Maternity Admissions Unit), where you are being seen today, will no longer take care of non-pregnant patients. We strongly encourage you to find a doctor's office before that time, so that you can be seen with any GYN concerns, like vaginal discharge, urinary tract infection, etc.. in a timely manner.  In order to make an office visit more convenient, the Center for [facility] at [facility] will be offering evening hours with same-day appointments, walk-in appointments and scheduled appointments available during this time.  Center for [facility] @ [facility] Hours: Monday - 8am - 7:30 pm with walk-in between 4pm- 7:30 pm Tuesday - 8 am - 5 pm (starting 02/07/18 we will be open late and accepting walk-ins from 4pm - 7:30pm) Wednesday - 8 am - 5 pm (starting 05/10/18 we will be open late and accepting walk-ins from 4pm - 7:30pm) Thursday 8 am - 5 pm (starting 08/10/18 we will be open late and accepting walk-ins from 4pm - 7:30pm) Friday 8 am - 5 pm  For an appointment please call the Center for [facility] @ [facility] at [phone]  For urgent needs, [facility] is also available for management of urgent GYN complaints such as vaginal discharge or urinary tract infections.     Vaginal Yeast infection, Adult  Vaginal yeast infection is a condition that causes vaginal discharge as well as soreness, swelling, and redness (inflammation) of the vagina. This is a common condition. Some women get this infection frequently. What are the causes? This condition is caused by a change in the normal balance of the yeast (candida) and bacteria that live in the vagina. This change causes an overgrowth of yeast, which causes the inflammation. What increases the risk? The condition is more likely to develop in women who:  Take antibiotic medicines.  Have diabetes.  Take  birth control pills.  Are pregnant.  Douche often.  Have a weak body defense system (immune system).  Have been taking steroid medicines for a long time.  Frequently wear tight clothing. What are the signs or symptoms? Symptoms of this condition include:  White, thick, creamy vaginal discharge.  Swelling, itching, redness, and irritation of the vagina. The lips of the vagina (vulva) may be affected as well.  Pain or a burning feeling while urinating.  Pain during sex. How is this diagnosed? This condition is diagnosed based on:  Your medical history.  A physical exam.  A pelvic exam. Your health care provider will examine a sample of your vaginal discharge under a microscope. Your health care provider may send this sample for testing to confirm the diagnosis. How is this treated? This condition is treated with medicine. Medicines may be over-the-counter or prescription. You may be told to use one or more of the following:  Medicine that is taken by mouth (orally).  Medicine that is applied as a cream (topically).  Medicine that is inserted directly into the vagina (suppository). Follow these instructions at home:  Lifestyle  Do not have sex until your health care provider approves. Tell your sex partner that you have a yeast infection. That person should go to his or her health care provider and ask if they should also be treated.  Do not wear tight clothes, such as pantyhose or tight pants.  Wear breathable cotton underwear. General instructions  Take or apply over-the-counter and prescription medicines only as told by your  health care provider.  Eat more yogurt. This may help to keep your yeast infection from returning.  Do not use tampons until your health care provider approves.  Try taking a sitz bath to help with discomfort. This is a warm water bath that is taken while you are sitting down. The water should only come up to your hips and should cover your  buttocks. Do this 3-4 times per day or as told by your health care provider.  Do not douche.  If you have diabetes, keep your blood sugar levels under control.  Keep all follow-up visits as told by your health care provider. This is important. Contact a health care provider if:  You have a fever.  Your symptoms go away and then return.  Your symptoms do not get better with treatment.  Your symptoms get worse.  You have new symptoms.  You develop blisters in or around your vagina.  You have blood coming from your vagina and it is not your menstrual period.  You develop pain in your abdomen. Summary  Vaginal yeast infection is a condition that causes discharge as well as soreness, swelling, and redness (inflammation) of the vagina.  This condition is treated with medicine. Medicines may be over-the-counter or prescription.  Take or apply over-the-counter and prescription medicines only as told by your health care provider.  Do not douche. Do not have sex or use tampons until your health care provider approves.  Contact a health care provider if your symptoms do not get better with treatment or your symptoms go away and then return. This information is not intended to replace advice given to you by your health care provider. Make sure you discuss any questions you have with your health care provider. Document Released: 08/04/2005 Document Revised: 03/13/2018 Document Reviewed: 03/13/2018 Elsevier Interactive Patient Education  2019 [employer].

## 2018-12-23 NOTE — MAU Note (Signed)
Mervin Sandifer is a 23 y.o.  here in MAU reporting:  +vaginal odor: denies discharge Reports that her "vaginal feels weird" following intercourse. LMP: [id number] Pain score: denies Vitals:   12/23/18 1619  BP: 125/83  Pulse: 92  Resp: 18  Temp: 98.2 F (36.8 C)  SpO2: 99%     Lab orders placed from triage: ua and pregnancy test

## 2018-12-25 LAB — GC/CHLAMYDIA PROBE AMP (~~LOC~~) NOT AT ARMC
Chlamydia: NEGATIVE
Neisseria Gonorrhea: NEGATIVE

## 2019-01-10 ENCOUNTER — Other Ambulatory Visit: Payer: Self-pay

## 2019-01-10 ENCOUNTER — Emergency Department (INDEPENDENT_AMBULATORY_CARE_PROVIDER_SITE_OTHER)
Admission: EM | Admit: 2019-01-10 | Discharge: 2019-01-10 | Disposition: A | Discharge status: Home / Self Care | Payer: Self-pay | Source: Home / Self Care

## 2019-01-10 ENCOUNTER — Encounter: Payer: Self-pay | Admitting: *Deleted

## 2019-01-10 ENCOUNTER — Other Ambulatory Visit (HOSPITAL_COMMUNITY)
Admission: RE | Admit: 2019-01-10 | Discharge: 2019-01-10 | Disposition: A | Discharge status: Home / Self Care | Payer: Self-pay | Source: Ambulatory Visit | Attending: Family Medicine | Admitting: Family Medicine

## 2019-01-10 DIAGNOSIS — N730 Acute parametritis and pelvic cellulitis: Secondary | ICD-10-CM

## 2019-01-10 DIAGNOSIS — R1110 Vomiting, unspecified: Secondary | ICD-10-CM | POA: Insufficient documentation

## 2019-01-10 DIAGNOSIS — R109 Unspecified abdominal pain: Secondary | ICD-10-CM | POA: Insufficient documentation

## 2019-01-10 DIAGNOSIS — R197 Diarrhea, unspecified: Secondary | ICD-10-CM | POA: Insufficient documentation

## 2019-01-10 LAB — POCT URINALYSIS DIP (MANUAL ENTRY)
Bilirubin, UA: NEGATIVE
Blood, UA: NEGATIVE
Glucose, UA: NEGATIVE mg/dL
Ketones, POC UA: NEGATIVE mg/dL
Leukocytes, UA: NEGATIVE
Nitrite, UA: NEGATIVE
Spec Grav, UA: 1.02 (ref 1.010–1.025)
Urobilinogen, UA: 2 E.U./dL — AB
pH, UA: 8.5 — AB (ref 5.0–8.0)

## 2019-01-10 LAB — POCT URINE PREGNANCY: Preg Test, Ur: NEGATIVE

## 2019-01-10 MED ORDER — PROMETHAZINE HCL 25 MG PO TABS: 25.0000 mg | ORAL_TABLET | Freq: Four times a day (QID) | ORAL | 0 refills | Status: DC | PRN

## 2019-01-10 MED ORDER — ONDANSETRON 4 MG PO TBDP
4.0000 mg | ORAL_TABLET | Freq: Once | ORAL | Status: AC
  Administered 2019-01-10: 4 mg via ORAL

## 2019-01-10 MED ORDER — CEFTRIAXONE SODIUM 250 MG IJ SOLR
250.0000 mg | Freq: Once | INTRAMUSCULAR | Status: AC
  Administered 2019-01-10: 250 mg via INTRAMUSCULAR

## 2019-01-10 MED ORDER — AZITHROMYCIN 250 MG PO TABS: ORAL_TABLET | ORAL | 0 refills | Status: DC

## 2019-01-10 NOTE — ED Triage Notes (Signed)
Patient c/o Diarrhea and vomiting since 12/31/2018. Started after her daughters birthday party, she has been sick as well. Lower abdominal pain x 1 week. Fever only 1st 2 days.

## 2019-01-10 NOTE — ED Provider Notes (Signed)
[facility]    CSN: [id number] Arrival date & time: 01/10/19  1524     History   Chief Complaint Chief Complaint  Patient presents with  . Diarrhea  . Abdominal Pain  . Emesis    HPI Terry [name] is a 23 y.o. female.   Established patient here for diarrhea, nausea, and bilateral lower abd. pain  Reports diarrhea since 12/31/2018 that is improving.  Intermittent nausea and vomiting since 12/31/2018.  Dietary recall Sunday able to eat lasagna; Monday tomato soup; Tuesday peanut butter and jelly sandwich; Today she reports nausea and has not felt like eating.  Reports burning with urination and thick, non odorous vaginal discharge.  Bilateral lower quadrant abd. pain     Past Medical History:  Diagnosis Date  . Chlamydia   . Gonorrhea     Patient Active Problem List   Diagnosis Date Noted  . Normal labor 12/27/2016  . Cervical shortening affecting pregnancy 09/20/2016  . Supervision of normal first pregnancy 05/19/2016  . Pelvic pain in female 07/28/2015    Past Surgical History:  Procedure Laterality Date  . NO PAST SURGERIES      OB History    Gravida  1   Para  1   Term  1   Preterm      AB  0   Living  1     SAB  0   TAB      Ectopic      Multiple  0   Live Births  1            Home Medications    Prior to Admission medications   Medication Sig Start Date End Date Taking? Authorizing Provider  azithromycin (ZITHROMAX) 250 MG tablet Take all 4 tablets at one time with food 01/10/19   Elvina Sidle, MD    Family History Family History  Problem Relation Age of Onset  . Cancer Maternal Grandmother        pancreatic  . Diabetes Maternal Uncle   . Diabetes Mother   . Hypertension Mother     Social History Social History   Tobacco Use  . Smoking status: Never Smoker  . Smokeless tobacco: Never Used  Substance Use Topics  . Alcohol use: No  . Drug use: No     Allergies   Patient has no known  allergies.   Review of Systems Review of Systems  Constitutional: Positive for appetite change. Negative for unexpected weight change.  Gastrointestinal: Positive for abdominal pain, diarrhea, nausea and vomiting.  Genitourinary: Positive for dysuria and vaginal discharge.     Physical Exam Triage Vital Signs ED Triage Vitals  Enc Vitals Group     BP      Pulse      Resp      Temp      Temp src      SpO2      Weight      Height      Head Circumference      Peak Flow      Pain Score      Pain Loc      Pain Edu?      Excl. in GC?    Orthostatic VS for the past 24 hrs:  BP- Lying Pulse- Lying BP- Sitting Pulse- Sitting BP- Standing at 0 minutes Pulse- Standing at 0 minutes  01/10/19 1618 113/78 85 108/72 79 107/74 89    Updated Vital Signs BP 120/81 (BP Location:  Right Arm)   Pulse 90   Temp 98.5 F (36.9 C) (Oral)   Resp 16   Wt 61.7 kg   LMP 12/12/2018   SpO2 99%   BMI 19.51 kg/m   Physical Exam Vitals signs and nursing note reviewed.  Constitutional:      Appearance: She is well-developed. She is ill-appearing.  HENT:     Mouth/Throat:     Mouth: Mucous membranes are moist.  Eyes:     Pupils: Pupils are equal, round, and reactive to light.  Cardiovascular:     Rate and Rhythm: Normal rate and regular rhythm.     Heart sounds: Normal heart sounds.  Pulmonary:     Breath sounds: Normal breath sounds.  Abdominal:     General: Bowel sounds are normal.     Palpations: Abdomen is soft.     Tenderness: There is abdominal tenderness in the right lower quadrant, epigastric area and left lower quadrant. There is right CVA tenderness and left CVA tenderness.  Skin:    General: Skin is warm and dry.  Neurological:     Mental Status: She is alert.      UC Treatments / Results  Labs (all labs ordered are listed, but only abnormal results are displayed) Labs Reviewed  POCT URINALYSIS DIP (MANUAL ENTRY) - Abnormal; Notable for the following components:       Result Value   pH, UA 8.5 (*)    Protein Ur, POC trace (*)    Urobilinogen, UA 2.0 (*)    All other components within normal limits  POCT URINE PREGNANCY  CERVICOVAGINAL ANCILLARY ONLY    EKG None  Radiology No results found.  Procedures Procedures (including critical care time)  Medications Ordered in UC Medications  cefTRIAXone (ROCEPHIN) injection 250 mg (has no administration in time range)  ondansetron (ZOFRAN-ODT) disintegrating tablet 4 mg (4 mg Oral Given 01/10/19 1616)    Initial Impression / Assessment and Plan / UC Course  I have reviewed the triage vital signs and the nursing notes.  Pertinent labs & imaging results that were available during my care of the patient were reviewed by me and considered in my medical decision making (see chart for details).    Final Clinical Impressions(s) / UC Diagnoses   Final diagnoses:  PID (acute pelvic inflammatory disease)     Discharge Instructions     If symptoms persist, you will need a pelvic ultrasound.  This would need to be performed at [facility] clinic (gynecologist) or emergency room.    ED Prescriptions    Medication Sig Dispense Auth. Provider   azithromycin (ZITHROMAX) 250 MG tablet Take all 4 tablets at one time with food 4 each Elvina Sidle, MD     Controlled Substance Prescriptions [place] Controlled Substance Registry consulted? Not Applicable   Elvina Sidle, MD 01/10/19 [phone]

## 2019-01-10 NOTE — Discharge Instructions (Addendum)
If symptoms persist, you will need a pelvic ultrasound.  This would need to be performed at [facility] clinic (gynecologist) or emergency room.

## 2019-01-11 LAB — CERVICOVAGINAL ANCILLARY ONLY
Bacterial vaginitis: NEGATIVE
Chlamydia: NEGATIVE
Neisseria Gonorrhea: NEGATIVE
Trichomonas: NEGATIVE

## 2019-01-12 ENCOUNTER — Telehealth: Payer: Self-pay | Admitting: *Deleted

## 2019-01-12 NOTE — Telephone Encounter (Signed)
Spoke to pt password verified and lab results given lab results.

## 2019-01-22 ENCOUNTER — Other Ambulatory Visit: Payer: Self-pay

## 2019-01-22 ENCOUNTER — Ambulatory Visit (HOSPITAL_COMMUNITY)
Admission: EM | Admit: 2019-01-22 | Discharge: 2019-01-22 | Disposition: A | Discharge status: Home / Self Care | Payer: Self-pay | Attending: Family Medicine | Admitting: Family Medicine

## 2019-01-22 ENCOUNTER — Encounter (HOSPITAL_COMMUNITY): Payer: Self-pay | Admitting: Emergency Medicine

## 2019-01-22 DIAGNOSIS — N760 Acute vaginitis: Secondary | ICD-10-CM | POA: Insufficient documentation

## 2019-01-22 MED ORDER — METRONIDAZOLE 500 MG PO TABS: 500.0000 mg | ORAL_TABLET | Freq: Two times a day (BID) | ORAL | 0 refills | Status: AC

## 2019-01-22 NOTE — ED Triage Notes (Signed)
Pt stated she went to urgent care and was given medication and then two days ago she started having discharge and a smell to the discharge. Pt is itching and discomfort in her vagina area.

## 2019-01-22 NOTE — ED Notes (Signed)
Instructed patient to obtain specimen

## 2019-01-22 NOTE — ED Notes (Signed)
Specimen in lab 

## 2019-01-22 NOTE — ED Provider Notes (Signed)
[facility]    CSN: [id number] Arrival date & time: 01/22/19  1545     History   Chief Complaint Chief Complaint  Patient presents with  . Vaginal Discharge    HPI Terry [name] is a 23 y.o. female.   Adama presents with complaints of persistent vaginal discharge with itching. Was seen here 3/4 and treated for PID. States symptoms have not improved with that treatment. States feels similar to BV she has had in the past. Odor. No urinary symptoms. No pelvic pain. No vaginal bleeding. No fevers. Denies external sores or lesions. She denies sexual activity. Hx of STI's.    ROS per HPI, negative if not otherwise mentioned.      Past Medical History:  Diagnosis Date  . Chlamydia   . Gonorrhea     Patient Active Problem List   Diagnosis Date Noted  . Normal labor 12/27/2016  . Cervical shortening affecting pregnancy 09/20/2016  . Supervision of normal first pregnancy 05/19/2016  . Pelvic pain in female 07/28/2015    Past Surgical History:  Procedure Laterality Date  . NO PAST SURGERIES      OB History    Gravida  1   Para  1   Term  1   Preterm      AB  0   Living  1     SAB  0   TAB      Ectopic      Multiple  0   Live Births  1            Home Medications    Prior to Admission medications   Medication Sig Start Date End Date Taking? Authorizing Provider  azithromycin (ZITHROMAX) 250 MG tablet Take all 4 tablets at one time with food 01/10/19   Elvina Sidle, MD  metroNIDAZOLE (FLAGYL) 500 MG tablet Take 1 tablet (500 mg total) by mouth 2 (two) times daily for 7 days. 01/22/19 01/29/19  Georgetta Haber, NP  promethazine (PHENERGAN) 25 MG tablet Take 1 tablet (25 mg total) by mouth every 6 (six) hours as needed for nausea or vomiting. 01/10/19   Elvina Sidle, MD    Family History Family History  Problem Relation Age of Onset  . Cancer Maternal Grandmother        pancreatic  . Diabetes Maternal Uncle   . Diabetes  Mother   . Hypertension Mother     Social History Social History   Tobacco Use  . Smoking status: Never Smoker  . Smokeless tobacco: Never Used  Substance Use Topics  . Alcohol use: No  . Drug use: No     Allergies   Patient has no known allergies.   Review of Systems Review of Systems   Physical Exam Triage Vital Signs ED Triage Vitals  Enc Vitals Group     BP 01/22/19 1654 113/71     Pulse Rate 01/22/19 1654 76     Resp 01/22/19 1654 16     Temp 01/22/19 1654 98 F (36.7 C)     Temp Source 01/22/19 1654 Temporal     SpO2 01/22/19 1654 99 %     Weight 01/22/19 1656 136 lb (61.7 kg)     Height 01/22/19 1656 5\' 10"  (1.778 m)     Head Circumference --      Peak Flow --      Pain Score 01/22/19 1656 0     Pain Loc --      Pain Edu? --  Excl. in GC? --    No data found.  Updated Vital Signs BP 113/71   Pulse 76   Temp 98 F (36.7 C) (Temporal)   Resp 16   Ht 5\' 10"  (1.778 m)   Wt 136 lb (61.7 kg)   LMP 01/10/2019   SpO2 99%   BMI 19.51 kg/m   Visual Acuity Right Eye Distance:   Left Eye Distance:   Bilateral Distance:    Right Eye Near:   Left Eye Near:    Bilateral Near:     Physical Exam Constitutional:      General: She is not in acute distress.    Appearance: She is well-developed.  Cardiovascular:     Rate and Rhythm: Normal rate and regular rhythm.     Heart sounds: Normal heart sounds.  Pulmonary:     Effort: Pulmonary effort is normal.     Breath sounds: Normal breath sounds.  Abdominal:     Palpations: Abdomen is soft. Abdomen is not rigid.     Tenderness: There is no abdominal tenderness. There is no guarding or rebound.  Genitourinary:    Comments: Denies sores, lesions, vaginal bleeding; no pelvic pain; gu exam deferred at this time, vaginal self swab collected.   Skin:    General: Skin is warm and dry.  Neurological:     Mental Status: She is alert and oriented to person, place, and time.      UC Treatments /  Results  Labs (all labs ordered are listed, but only abnormal results are displayed) Labs Reviewed  CERVICOVAGINAL ANCILLARY ONLY    EKG None  Radiology No results found.  Procedures Procedures (including critical care time)  Medications Ordered in UC Medications - No data to display  Initial Impression / Assessment and Plan / UC Course  I have reviewed the triage vital signs and the nursing notes.  Pertinent labs & imaging results that were available during my care of the patient were reviewed by me and considered in my medical decision making (see chart for details).     Flagyl initiated with vaginal cytology initiated. Will notify of any positive findings and if any changes to treatment are needed.  If symptoms worsen or do not improve in the next week to return to be seen or to follow up with PCP.  Patient verbalized understanding and agreeable to plan.   Final Clinical Impressions(s) / UC Diagnoses   Final diagnoses:  Acute vaginitis   Discharge Instructions   None    ED Prescriptions    Medication Sig Dispense Auth. Provider   metroNIDAZOLE (FLAGYL) 500 MG tablet Take 1 tablet (500 mg total) by mouth 2 (two) times daily for 7 days. 14 tablet Georgetta Haber, NP     Controlled Substance Prescriptions [place] Controlled Substance Registry consulted? Not Applicable   Georgetta Haber, NP 01/22/19 1942

## 2019-01-24 LAB — CERVICOVAGINAL ANCILLARY ONLY
Bacterial vaginitis: POSITIVE — AB
CANDIDA VAGINITIS: NEGATIVE
Chlamydia: NEGATIVE
NEISSERIA GONORRHEA: NEGATIVE
TRICH (WINDOWPATH): NEGATIVE

## 2019-05-01 ENCOUNTER — Other Ambulatory Visit: Payer: Self-pay

## 2019-05-01 ENCOUNTER — Encounter (HOSPITAL_COMMUNITY): Payer: Self-pay

## 2019-05-01 ENCOUNTER — Emergency Department (HOSPITAL_COMMUNITY)
Admission: EM | Admit: 2019-05-01 | Discharge: 2019-05-01 | Disposition: A | Discharge status: Home / Self Care | Payer: Self-pay | Attending: Emergency Medicine | Admitting: Emergency Medicine

## 2019-05-01 DIAGNOSIS — R110 Nausea: Secondary | ICD-10-CM | POA: Insufficient documentation

## 2019-05-01 DIAGNOSIS — R1033 Periumbilical pain: Secondary | ICD-10-CM | POA: Insufficient documentation

## 2019-05-01 DIAGNOSIS — B9689 Other specified bacterial agents as the cause of diseases classified elsewhere: Secondary | ICD-10-CM | POA: Insufficient documentation

## 2019-05-01 DIAGNOSIS — N760 Acute vaginitis: Secondary | ICD-10-CM | POA: Insufficient documentation

## 2019-05-01 LAB — URINALYSIS, ROUTINE W REFLEX MICROSCOPIC
Bilirubin Urine: NEGATIVE
Glucose, UA: NEGATIVE mg/dL
Hgb urine dipstick: NEGATIVE
Ketones, ur: NEGATIVE mg/dL
Leukocytes,Ua: NEGATIVE
Nitrite: NEGATIVE
Protein, ur: NEGATIVE mg/dL
Specific Gravity, Urine: 1.023 (ref 1.005–1.030)
pH: 5 (ref 5.0–8.0)

## 2019-05-01 LAB — WET PREP, GENITAL
Sperm: NONE SEEN
Trich, Wet Prep: NONE SEEN
Yeast Wet Prep HPF POC: NONE SEEN

## 2019-05-01 LAB — PREGNANCY, URINE: Preg Test, Ur: NEGATIVE

## 2019-05-01 MED ORDER — METRONIDAZOLE 500 MG PO TABS: 500.0000 mg | ORAL_TABLET | Freq: Two times a day (BID) | ORAL | 0 refills | Status: DC

## 2019-05-01 NOTE — ED Provider Notes (Signed)
[facility] Provider Note   CSN: [id number] Arrival date & time: 05/01/19  1606     History   Chief Complaint Chief Complaint  Patient presents with  . Hematuria    HPI Terry [name] is a 23 y.o. female who presents with hematuria. PMH significant for STI, recurrent BV and yeast infections. She states that for the past 3-4 days she has had periumbilical abdominal pain. It comes and goes. It feels like a sharp stabbing pain. She had to go home from work yesterday because it was hurting. It hurts when she has sex. Nothing has made it better. She has seen some blood on the tissue when she urinates and reports associated nausea. No fever, flank pain, vomiting. She has been eating and drinking normally. She denies vaginal discharge, odor, itching, soreness. Her LMP was 2 weeks ago. She is sexually active with her boyfriend of 6 years and is trying to get pregnant.     HPI  Past Medical History:  Diagnosis Date  . Chlamydia   . Gonorrhea     Patient Active Problem List   Diagnosis Date Noted  . Normal labor 12/27/2016  . Cervical shortening affecting pregnancy 09/20/2016  . Supervision of normal first pregnancy 05/19/2016  . Pelvic pain in female 07/28/2015    Past Surgical History:  Procedure Laterality Date  . NO PAST SURGERIES       OB History    Gravida  1   Para  1   Term  1   Preterm      AB  0   Living  1     SAB  0   TAB      Ectopic      Multiple  0   Live Births  1            Home Medications    Prior to Admission medications   Medication Sig Start Date End Date Taking? Authorizing Provider  azithromycin (ZITHROMAX) 250 MG tablet Take all 4 tablets at one time with food 01/10/19   Robyn Haber, MD  promethazine (PHENERGAN) 25 MG tablet Take 1 tablet (25 mg total) by mouth every 6 (six) hours as needed for nausea or vomiting. 01/10/19   Robyn Haber, MD    Family History Family History   Problem Relation Age of Onset  . Cancer Maternal Grandmother        pancreatic  . Diabetes Maternal Uncle   . Diabetes Mother   . Hypertension Mother     Social History Social History   Tobacco Use  . Smoking status: Never Smoker  . Smokeless tobacco: Never Used  Substance Use Topics  . Alcohol use: No  . Drug use: No     Allergies   Patient has no known allergies.   Review of Systems Review of Systems  Constitutional: Negative for appetite change and fever.  Gastrointestinal: Positive for abdominal pain and nausea. Negative for constipation, diarrhea and vomiting.  Genitourinary: Positive for dyspareunia and hematuria. Negative for difficulty urinating, dysuria, flank pain, frequency, pelvic pain, vaginal bleeding, vaginal discharge and vaginal pain.  All other systems reviewed and are negative.    Physical Exam Updated Vital Signs BP 124/66 (BP Location: Right Arm)   Pulse 89   Temp 98.8 F (37.1 C) (Oral)   Resp 12   Ht 5\' 10"  (1.778 m)   Wt 65.8 kg   SpO2 98%   BMI 20.81 kg/m   Physical Exam  Vitals signs and nursing note reviewed.  Constitutional:      General: She is not in acute distress.    Appearance: Normal appearance. She is well-developed. She is not ill-appearing.  HENT:     Head: Normocephalic and atraumatic.  Eyes:     General: No scleral icterus.       Right eye: No discharge.        Left eye: No discharge.     Conjunctiva/sclera: Conjunctivae normal.     Pupils: Pupils are equal, round, and reactive to light.  Neck:     Musculoskeletal: Normal range of motion.  Cardiovascular:     Rate and Rhythm: Normal rate and regular rhythm.  Pulmonary:     Effort: Pulmonary effort is normal. No respiratory distress.     Breath sounds: Normal breath sounds.  Abdominal:     General: There is no distension.     Palpations: Abdomen is soft.     Tenderness: There is no abdominal tenderness.  Genitourinary:    Comments: Pelvic: No inguinal  lymphadenopathy or inguinal hernia noted. Normal external genitalia. No pain with speculum insertion. Closed cervical os with normal appearance. White discharge in vaginal vault. On bimanual examination no adnexal tenderness or cervical motion tenderness. Chaperone present during exam.  Skin:    General: Skin is warm and dry.  Neurological:     Mental Status: She is alert and oriented to person, place, and time.  Psychiatric:        Behavior: Behavior normal.      ED Treatments / Results  Labs (all labs ordered are listed, but only abnormal results are displayed) Labs Reviewed  WET PREP, GENITAL - Abnormal; Notable for the following components:      Result Value   Clue Cells Wet Prep HPF POC PRESENT (*)    WBC, Wet Prep HPF POC FEW (*)    All other components within normal limits  URINE CULTURE  URINALYSIS, ROUTINE W REFLEX MICROSCOPIC  PREGNANCY, URINE  GC/CHLAMYDIA PROBE AMP ([place]) NOT AT [facility]    EKG    Radiology No results found.  Procedures Procedures (including critical care time)  Medications Ordered in ED Medications - No data to display   Initial Impression / Assessment and Plan / ED Course  I have reviewed the triage vital signs and the nursing notes.  Pertinent labs & imaging results that were available during my care of the patient were reviewed by me and considered in my medical decision making (see chart for details).  23 year old female presents with periumbilical abdominal pain, nausea, reported hematuria.  Her vital signs are normal.  She is well-appearing.  Her abdomen is soft and nontender.  Her pelvic exam is remarkable for a small amount of discharge.  Her urine test is normal.  She is not pregnant.  Her wet prep shows clue cells.  Gonorrhea and chlamydia swab were sent.  Unclear etiology of her symptoms.  We will treat for BV which she has had multiple times in the past.  Return precautions given  Final Clinical Impressions(s) / ED Diagnoses    Final diagnoses:  Periumbilical abdominal pain  Nausea  BV (bacterial vaginosis)    ED Discharge Orders    None       Bethel BornGekas, Denishia Citro Marie, PA-C 05/01/19 1859    Mancel BaleWentz, Elliott, MD 05/03/19 [phone]

## 2019-05-01 NOTE — ED Triage Notes (Signed)
Pt arrives POV for eval of hematuria onset x3 days. Pt also reports pain worse w/ intercourse. Denies dysuria.

## 2019-05-01 NOTE — Discharge Instructions (Addendum)
Take Flagyl twice daily for one week. Do not drink alcohol with this medicine Please return if you are worsening

## 2019-05-02 LAB — URINE CULTURE

## 2019-05-02 LAB — GC/CHLAMYDIA PROBE AMP (~~LOC~~) NOT AT ARMC
Chlamydia: NEGATIVE
Neisseria Gonorrhea: NEGATIVE

## 2019-07-11 ENCOUNTER — Emergency Department (HOSPITAL_COMMUNITY): Payer: Self-pay

## 2019-07-11 ENCOUNTER — Other Ambulatory Visit: Payer: Self-pay

## 2019-07-11 ENCOUNTER — Emergency Department (HOSPITAL_COMMUNITY)
Admission: EM | Admit: 2019-07-11 | Discharge: 2019-07-11 | Disposition: A | Discharge status: Home / Self Care | Payer: Self-pay | Attending: Emergency Medicine | Admitting: Emergency Medicine

## 2019-07-11 ENCOUNTER — Encounter (HOSPITAL_COMMUNITY): Payer: Self-pay

## 2019-07-11 DIAGNOSIS — B9689 Other specified bacterial agents as the cause of diseases classified elsewhere: Secondary | ICD-10-CM | POA: Insufficient documentation

## 2019-07-11 DIAGNOSIS — Z3A01 Less than 8 weeks gestation of pregnancy: Secondary | ICD-10-CM

## 2019-07-11 DIAGNOSIS — N760 Acute vaginitis: Secondary | ICD-10-CM

## 2019-07-11 DIAGNOSIS — O23591 Infection of other part of genital tract in pregnancy, first trimester: Secondary | ICD-10-CM | POA: Insufficient documentation

## 2019-07-11 LAB — URINALYSIS, ROUTINE W REFLEX MICROSCOPIC
Bilirubin Urine: NEGATIVE
Glucose, UA: NEGATIVE mg/dL
Hgb urine dipstick: NEGATIVE
Ketones, ur: NEGATIVE mg/dL
Leukocytes,Ua: NEGATIVE
Nitrite: NEGATIVE
Protein, ur: NEGATIVE mg/dL
Specific Gravity, Urine: 1.024 (ref 1.005–1.030)
pH: 5 (ref 5.0–8.0)

## 2019-07-11 LAB — I-STAT BETA HCG BLOOD, ED (MC, WL, AP ONLY): I-stat hCG, quantitative: 36.9 m[IU]/mL — ABNORMAL HIGH (ref <5)

## 2019-07-11 LAB — WET PREP, GENITAL
Sperm: NONE SEEN
Trich, Wet Prep: NONE SEEN
Yeast Wet Prep HPF POC: NONE SEEN

## 2019-07-11 MED ORDER — METRONIDAZOLE 500 MG PO TABS: 500.0000 mg | ORAL_TABLET | Freq: Two times a day (BID) | ORAL | 0 refills | Status: DC

## 2019-07-11 MED ORDER — PRENATAL COMPLETE 14-0.4 MG PO TABS: 1.0000 | ORAL_TABLET | Freq: Every day | ORAL | 0 refills | Status: DC

## 2019-07-11 NOTE — ED Notes (Signed)
Patient transported to Ultrasound 

## 2019-07-11 NOTE — ED Provider Notes (Signed)
[facility] EMERGENCY [facility]MENT Provider Note   CSN: [id number] Arrival date & time: 07/11/19  [id number]     History   Chief Complaint Chief Complaint  Patient presents with   Vaginal Discharge    HPI Terry [name] is a 23 y.o. female who presents to ED for 1 month history of vaginal discharge, foul odor.  Reports lower abdominal pain that has been intermittent for the past several days as well.  States that she did have unprotected sexual intercourse with a female partner approximately 1 month ago, however she states that she is not concerned for STDs.  States that her last period was approximately over 1 month ago.  She denies any changes to bowel movements, urination or vomiting, abnormal vaginal bleeding, fevers.      HPI  Past Medical History:  Diagnosis Date   Chlamydia    Gonorrhea     Patient Active Problem List   Diagnosis Date Noted   Normal labor 12/27/2016   Cervical shortening affecting pregnancy 09/20/2016   Supervision of normal first pregnancy 05/19/2016   Pelvic pain in female 07/28/2015    Past Surgical History:  Procedure Laterality Date   NO PAST SURGERIES       OB History    Gravida  1   Para  1   Term  1   Preterm      AB  0   Living  1     SAB  0   TAB      Ectopic      Multiple  0   Live Births  1            Home Medications    Prior to Admission medications   Medication Sig Start Date End Date Taking? Authorizing Provider  azithromycin (ZITHROMAX) 250 MG tablet Take all 4 tablets at one time with food 01/10/19   Elvina SidleLauenstein, Kurt, MD  metroNIDAZOLE (FLAGYL) 500 MG tablet Take 1 tablet (500 mg total) by mouth 2 (two) times daily. 07/11/19   Mykel Sponaugle, Hillary BowHina, PA-C  Prenatal Vit-Fe Fumarate-FA (PRENATAL COMPLETE) 14-0.4 MG TABS Take 1 tablet by mouth daily. 07/11/19   Cierra Rothgeb, PA-C  promethazine (PHENERGAN) 25 MG tablet Take 1 tablet (25 mg total) by mouth every 6 (six) hours as needed for nausea or  vomiting. 01/10/19   Elvina SidleLauenstein, Kurt, MD    Family History Family History  Problem Relation Age of Onset   Cancer Maternal Grandmother        pancreatic   Diabetes Maternal Uncle    Diabetes Mother    Hypertension Mother     Social History Social History   Tobacco Use   Smoking status: Never Smoker   Smokeless tobacco: Never Used  Substance Use Topics   Alcohol use: No   Drug use: No     Allergies   Patient has no known allergies.   Review of Systems Review of Systems  Constitutional: Negative for appetite change, chills and fever.  HENT: Negative for ear pain, rhinorrhea, sneezing and sore throat.   Eyes: Negative for photophobia and visual disturbance.  Respiratory: Negative for cough, chest tightness, shortness of breath and wheezing.   Cardiovascular: Negative for chest pain and palpitations.  Gastrointestinal: Positive for abdominal pain. Negative for blood in stool, constipation, diarrhea, nausea and vomiting.  Genitourinary: Positive for vaginal discharge. Negative for dysuria, hematuria and urgency.  Musculoskeletal: Negative for myalgias.  Skin: Negative for rash.  Neurological: Negative for dizziness, weakness and light-headedness.  Physical Exam Updated Vital Signs BP 107/66    Pulse 66    Temp 97.7 F (36.5 C) (Oral)    Resp 20    SpO2 98%   Physical Exam Vitals signs and nursing note reviewed. Exam conducted with a chaperone present.  Constitutional:      General: She is not in acute distress.    Appearance: She is well-developed.  HENT:     Head: Normocephalic and atraumatic.     Nose: Nose normal.  Eyes:     General: No scleral icterus.       Right eye: No discharge.        Left eye: No discharge.     Conjunctiva/sclera: Conjunctivae normal.  Neck:     Musculoskeletal: Normal range of motion and neck supple.  Cardiovascular:     Rate and Rhythm: Normal rate and regular rhythm.     Heart sounds: Normal heart sounds. No murmur.  No friction rub. No gallop.   Pulmonary:     Effort: Pulmonary effort is normal. No respiratory distress.     Breath sounds: Normal breath sounds.  Abdominal:     General: Bowel sounds are normal. There is no distension.     Palpations: Abdomen is soft.     Tenderness: There is abdominal tenderness (Suprapubic). There is no guarding.  Genitourinary:    Vagina: Vaginal discharge present.     Cervix: No cervical motion tenderness or discharge.     Uterus: Not tender.      Adnexa:        Right: No tenderness.         Left: No tenderness.       Comments: Pelvic exam: normal external genitalia without evidence of trauma. VULVA: normal appearing vulva with no masses, tenderness or lesion. VAGINA: normal appearing vagina with normal color and discharge, no lesions. CERVIX: normal appearing cervix without lesions, cervical motion tenderness absent, cervical os closed with out purulent discharge; Scant thick white vaginal discharge noted. Wet prep and DNA probe for chlamydia and GC obtained.   ADNEXA: normal adnexa in size, nontender and no masses UTERUS: uterus is normal size, shape, consistency and nontender.   Musculoskeletal: Normal range of motion.  Skin:    General: Skin is warm and dry.     Findings: No rash.  Neurological:     Mental Status: She is alert.     Motor: No abnormal muscle tone.     Coordination: Coordination normal.      ED Treatments / Results  Labs (all labs ordered are listed, but only abnormal results are displayed) Labs Reviewed  WET PREP, GENITAL - Abnormal; Notable for the following components:      Result Value   Clue Cells Wet Prep HPF POC PRESENT (*)    WBC, Wet Prep HPF POC FEW (*)    All other components within normal limits  URINALYSIS, ROUTINE W REFLEX MICROSCOPIC - Abnormal; Notable for the following components:   APPearance HAZY (*)    All other components within normal limits  I-STAT BETA HCG BLOOD, ED ([facility], [facility], [facility] ONLY) - Abnormal; Notable for  the following components:   I-stat hCG, quantitative 36.9 (*)    All other components within normal limits  GC/CHLAMYDIA PROBE AMP ([facility]) NOT AT [facility]    EKG None  Radiology [place] Ob Comp < 14 Wks  Result Date: 07/11/2019 CLINICAL DATA:  Acute abdominal pain, positive pregnancy test. EXAM: OBSTETRIC <14 WK US AND TRANSVAGINAL OB  US TECHNIQUE: Both transabdominal and transvaginal ultrasound examinations were performed for complete evaluation of the gestation as well as the maternal uterus, adnexal regions, and pelvic cul-de-sac. Transvaginal technique was performed to assess early pregnancy. COMPARISON:  None. FINDINGS: Intrauterine gestational sac: Not visualized. Yolk sac:  Not visualized. Embryo:  Not visualized. Cardiac Activity: Not visualized. Maternal uterus/adnexae: Possible corpus luteum cyst seen in left ovary. Otherwise ovaries are unremarkable. Trace free fluid is noted which most likely is physiologic. IMPRESSION: No intrauterine gestational sac, yolk sac, fetal pole, or cardiac activity visualized. Differential considerations include intrauterine gestation too early to be sonographically visualized, spontaneous abortion, or ectopic pregnancy. Consider follow-up ultrasound in 14 days and serial quantitative beta HCG follow-up. Electronically Signed   By: Lupita Raider M.D.   On: 07/11/2019 11:42   US Ob Transvaginal  Result Date: 07/11/2019 CLINICAL DATA:  Acute abdominal pain, positive pregnancy test. EXAM: OBSTETRIC <14 WK [place] AND TRANSVAGINAL OB US TECHNIQUE: Both transabdominal and transvaginal ultrasound examinations were performed for complete evaluation of the gestation as well as the maternal uterus, adnexal regions, and pelvic cul-de-sac. Transvaginal technique was performed to assess early pregnancy. COMPARISON:  None. FINDINGS: Intrauterine gestational sac: Not visualized. Yolk sac:  Not visualized. Embryo:  Not visualized. Cardiac Activity: Not visualized. Maternal  uterus/adnexae: Possible corpus luteum cyst seen in left ovary. Otherwise ovaries are unremarkable. Trace free fluid is noted which most likely is physiologic. IMPRESSION: No intrauterine gestational sac, yolk sac, fetal pole, or cardiac activity visualized. Differential considerations include intrauterine gestation too early to be sonographically visualized, spontaneous abortion, or ectopic pregnancy. Consider follow-up ultrasound in 14 days and serial quantitative beta HCG follow-up. Electronically Signed   By: Lupita Raider M.D.   On: 07/11/2019 11:42    Procedures Procedures (including critical care time)  Medications Ordered in ED Medications - No data to display   Initial Impression / Assessment and Plan / ED Course  I have reviewed the triage vital signs and the nursing notes.  Pertinent labs & imaging results that were available during my care of the patient were reviewed by me and considered in my medical decision making (see chart for details).        23yo F presents to ED for 1 month history of vaginal discharge.  She has been experiencing intermittent lower abdominal pain for the past several days as well.  She admits to unprotected sexual intercourse over 1 month ago but is not concerned for STDs.  She states that she has been diagnosed with bacterial vaginosis in the past and this feels similar.  On my exam abdomen is tender in the suprapubic area without rebound or guarding.  Her vital signs are within normal limits.  Pelvic exam revealed white vaginal discharge without cervical motion tenderness, adnexal tenderness or uterine tenderness.  Wet prep significant for clue cells, WBCs.  Urinalysis unremarkable.  hCG slightly elevated at 36.9.  Will obtain pelvic ultrasound to rule out ectopic due to her abdominal pain and positive pregnancy test.   Pelvic ultrasound with no visualized IUP which could be due to early pregnancy.  Will treat for Flagyl, give prenatal vitamins and have  her follow-up with OB/GYN.  Patient is hemodynamically stable, in NAD, and able to ambulate in the ED. Evaluation does not show pathology that would require ongoing emergent intervention or inpatient treatment. I explained the diagnosis to the patient. Pain has been managed and has no complaints prior to discharge. Patient is comfortable with above plan  and is stable for discharge at this time. All questions were answered prior to disposition. Strict return precautions for returning to the ED were discussed. Encouraged follow up with PCP.   An After Visit Summary was printed and given to the patient.   Portions of this note were generated with L[employer]. Dictation errors may occur despite best attempts at proofreading.  Final Clinical Impressions(s) / ED Diagnoses   Final diagnoses:  BV (bacterial vaginosis)  Less than [redacted] weeks gestation of pregnancy    ED Discharge Orders         Ordered    metroNIDAZOLE (FLAGYL) 500 MG tablet  2 times daily     07/11/19 1147    Prenatal Vit-Fe Fumarate-FA (PRENATAL COMPLETE) 14-0.4 MG TABS  Daily     07/11/19 1147           Delia Heady, PA-C 07/11/19 1149    Lacretia Leigh, MD 07/12/19 1230

## 2019-07-11 NOTE — ED Notes (Signed)
Patient verbalizes understanding of discharge instructions. Opportunity for questioning and answers were provided. Armband removed by staff, pt discharged from ED.  

## 2019-07-11 NOTE — ED Triage Notes (Signed)
Pt states that for the past month she has had a foul odor and vaginal discharge.

## 2019-07-11 NOTE — ED Notes (Signed)
Pt transported to ultrasound.

## 2019-07-11 NOTE — Discharge Instructions (Addendum)
Take the Flagyl as directed. It is important for you to follow-up with your OB/GYN for repeat ultrasound. Return to the ED if you start to develop worsening abdominal pain, vaginal bleeding, feeling like going to pass out.

## 2019-07-12 ENCOUNTER — Other Ambulatory Visit: Payer: Self-pay | Admitting: Obstetrics and Gynecology

## 2019-07-12 DIAGNOSIS — O3680X0 Pregnancy with inconclusive fetal viability, not applicable or unspecified: Secondary | ICD-10-CM

## 2019-07-12 LAB — GC/CHLAMYDIA PROBE AMP (~~LOC~~) NOT AT ARMC
Chlamydia: NEGATIVE
Neisseria Gonorrhea: NEGATIVE

## 2019-07-14 ENCOUNTER — Encounter (HOSPITAL_COMMUNITY): Payer: Self-pay | Admitting: *Deleted

## 2019-07-14 ENCOUNTER — Inpatient Hospital Stay (HOSPITAL_COMMUNITY)
Admission: AD | Admit: 2019-07-14 | Discharge: 2019-07-15 | Disposition: A | Discharge status: Home / Self Care | Payer: Self-pay | Attending: Obstetrics and Gynecology | Admitting: Obstetrics and Gynecology

## 2019-07-14 ENCOUNTER — Other Ambulatory Visit: Payer: Self-pay

## 2019-07-14 DIAGNOSIS — O209 Hemorrhage in early pregnancy, unspecified: Secondary | ICD-10-CM | POA: Insufficient documentation

## 2019-07-14 DIAGNOSIS — Z3A01 Less than 8 weeks gestation of pregnancy: Secondary | ICD-10-CM | POA: Insufficient documentation

## 2019-07-14 DIAGNOSIS — O3680X0 Pregnancy with inconclusive fetal viability, not applicable or unspecified: Secondary | ICD-10-CM

## 2019-07-14 NOTE — MAU Note (Signed)
Pt stated she had some pink spotting earlier this morning. Got darker red and heavier. Pt stated that she had to put a pad on but it never got saturated. Started having cramping a few hours ago.

## 2019-07-15 ENCOUNTER — Encounter (HOSPITAL_COMMUNITY): Payer: Self-pay | Admitting: *Deleted

## 2019-07-15 ENCOUNTER — Inpatient Hospital Stay (HOSPITAL_COMMUNITY): Payer: Self-pay

## 2019-07-15 DIAGNOSIS — O3680X0 Pregnancy with inconclusive fetal viability, not applicable or unspecified: Secondary | ICD-10-CM

## 2019-07-15 LAB — URINALYSIS, ROUTINE W REFLEX MICROSCOPIC
Bilirubin Urine: NEGATIVE
Glucose, UA: NEGATIVE mg/dL
Ketones, ur: NEGATIVE mg/dL
Leukocytes,Ua: NEGATIVE
Nitrite: NEGATIVE
Protein, ur: NEGATIVE mg/dL
Specific Gravity, Urine: 1 — ABNORMAL LOW (ref 1.005–1.030)
pH: 7 (ref 5.0–8.0)

## 2019-07-15 LAB — HCG, QUANTITATIVE, PREGNANCY: hCG, Beta Chain, Quant, S: 16 m[IU]/mL — ABNORMAL HIGH (ref <5)

## 2019-07-15 NOTE — MAU Provider Note (Signed)
Chief Complaint: Vaginal Bleeding and Abdominal Pain   None     SUBJECTIVE HPI: Terry [name] is a 23 y.o. G2P1001 at [redacted]w[redacted]d by LMP who presents to maternity admissions reporting onset of spotting this morning increasing to more bleeding by this afternoon.  She reports light red bleeding, requiring pantyliners but not pads, that is increasing in amount.  There is associated cramping.  She found out she was pregnant in the ED on 07/11/19 when she was seen for s/sx of BV.  At that time she had istat hcg of 36.9 and US showing no IUP or ectopic.  There are no other symptoms. She has not tried any other treatments.    HPI  Past Medical History:  Diagnosis Date  . Chlamydia   . Gonorrhea    Past Surgical History:  Procedure Laterality Date  . NO PAST SURGERIES     Social History   Socioeconomic History  . Marital status: Single    Spouse name: Not on file  . Number of children: Not on file  . Years of education: Not on file  . Highest education level: Not on file  Occupational History  . Not on file  Social Needs  . Financial resource strain: Not on file  . Food insecurity    Worry: Not on file    Inability: Not on file  . Transportation needs    Medical: Not on file    Non-medical: Not on file  Tobacco Use  . Smoking status: Never Smoker  . Smokeless tobacco: Never Used  Substance and Sexual Activity  . Alcohol use: No  . Drug use: No  . Sexual activity: Yes    Birth control/protection: None    Comment: 1 partner X 2 years  Lifestyle  . Physical activity    Days per week: Not on file    Minutes per session: Not on file  . Stress: Not on file  Relationships  . Social Herbalist on phone: Not on file    Gets together: Not on file    Attends religious service: Not on file    Active member of club or organization: Not on file    Attends meetings of clubs or organizations: Not on file    Relationship status: Not on file  . Intimate partner violence    Fear  of current or ex partner: Not on file    Emotionally abused: Not on file    Physically abused: Not on file    Forced sexual activity: Not on file  Other Topics Concern  . Not on file  Social History Narrative  . Not on file   No current facility-administered medications on file prior to encounter.    Current Outpatient Medications on File Prior to Encounter  Medication Sig Dispense Refill  . metroNIDAZOLE (FLAGYL) 500 MG tablet Take 1 tablet (500 mg total) by mouth 2 (two) times daily. 14 tablet 0  . Prenatal Vit-Fe Fumarate-FA (PRENATAL COMPLETE) 14-0.4 MG TABS Take 1 tablet by mouth daily. 60 tablet 0  . promethazine (PHENERGAN) 25 MG tablet Take 1 tablet (25 mg total) by mouth every 6 (six) hours as needed for nausea or vomiting. 10 tablet 0  . azithromycin (ZITHROMAX) 250 MG tablet Take all 4 tablets at one time with food 4 each 0   No Known Allergies  ROS:  Review of Systems   I have reviewed patient's Past Medical Hx, Surgical Hx, Family Hx, Social Hx, medications and allergies.  Physical Exam   Patient Vitals for the past 24 hrs:  BP Temp Pulse Resp Height Weight  07/14/19 2346 129/81 98.4 F (36.9 C) 83 20 5\' 9"  (1.753 m) 67.6 kg   Constitutional: Well-developed, well-nourished female in no acute distress.  Cardiovascular: normal rate Respiratory: normal effort GI: Abd soft, non-tender. Pos BS x 4 MS: Extremities nontender, no edema, normal ROM Neurologic: Alert and oriented x 4.  GU: Neg CVAT.  PELVIC EXAM: Deferred, swabs collected 07/11/19   LAB RESULTS Results for orders placed or performed during the hospital encounter of 07/14/19 (from the past 24 hour(s))  Urinalysis, Routine w reflex microscopic     Status: Abnormal   Collection Time: 07/15/19 12:17 AM  Result Value Ref Range   Color, Urine COLORLESS (A) YELLOW   APPearance CLEAR CLEAR   Specific Gravity, Urine 1.000 (L) 1.005 - 1.030   pH 7.0 5.0 - 8.0   Glucose, UA NEGATIVE NEGATIVE mg/dL   Hgb  urine dipstick LARGE (A) NEGATIVE   Bilirubin Urine NEGATIVE NEGATIVE   Ketones, ur NEGATIVE NEGATIVE mg/dL   Protein, ur NEGATIVE NEGATIVE mg/dL   Nitrite NEGATIVE NEGATIVE   Leukocytes,Ua NEGATIVE NEGATIVE   Bacteria, UA RARE (A) NONE SEEN  hCG, quantitative, pregnancy     Status: Abnormal   Collection Time: 07/15/19  1:48 AM  Result Value Ref Range   hCG, Beta Chain, Quant, S 16 (H) <5 mIU/mL       IMAGING US Ob Comp < 14 Wks  Result Date: 07/11/2019 CLINICAL DATA:  Acute abdominal pain, positive pregnancy test. EXAM: OBSTETRIC <14 WK [place] AND TRANSVAGINAL OB US TECHNIQUE: Both transabdominal and transvaginal ultrasound examinations were performed for complete evaluation of the gestation as well as the maternal uterus, adnexal regions, and pelvic cul-de-sac. Transvaginal technique was performed to assess early pregnancy. COMPARISON:  None. FINDINGS: Intrauterine gestational sac: Not visualized. Yolk sac:  Not visualized. Embryo:  Not visualized. Cardiac Activity: Not visualized. Maternal uterus/adnexae: Possible corpus luteum cyst seen in left ovary. Otherwise ovaries are unremarkable. Trace free fluid is noted which most likely is physiologic. IMPRESSION: No intrauterine gestational sac, yolk sac, fetal pole, or cardiac activity visualized. Differential considerations include intrauterine gestation too early to be sonographically visualized, spontaneous abortion, or ectopic pregnancy. Consider follow-up ultrasound in 14 days and serial quantitative beta HCG follow-up. Electronically Signed   By: Lupita Raider M.D.   On: 07/11/2019 11:42   US Ob Transvaginal  Result Date: 07/15/2019 CLINICAL DATA:  Vaginal bleeding in pregnancy EXAM: TRANSVAGINAL OB ULTRASOUND TECHNIQUE: Transvaginal ultrasound was performed for complete evaluation of the gestation as well as the maternal uterus, adnexal regions, and pelvic cul-de-sac. COMPARISON:  07/11/2019 FINDINGS: Intrauterine gestational sac: None Yolk  sac:  Not visualized Embryo:  Not visualized Cardiac Activity: Heart Rate:  bpm MSD:   mm    w     d CRL:     mm    w  d                  [place] EDC: Subchorionic hemorrhage:  None visualized. Maternal uterus/adnexae: No adnexal mass. Small amount of free fluid in the pelvis. IMPRESSION: No intrauterine pregnancy visualized. Differential considerations would include early intrauterine pregnancy too early to visualize, spontaneous abortion, or occult ectopic pregnancy. Recommend close clinical followup and serial quantitative beta HCGs and ultrasounds. Electronically Signed   By: Charlett Nose M.D.   On: 07/15/2019 02:47   US Ob Transvaginal  Result Date: 07/11/2019  CLINICAL DATA:  Acute abdominal pain, positive pregnancy test. EXAM: OBSTETRIC <14 WK US AND TRANSVAGINAL OB US TECHNIQUE: Both transabdominal and transvaginal ultrasound examinations were performed for complete evaluation of the gestation as well as the maternal uterus, adnexal regions, and pelvic cul-de-sac. Transvaginal technique was performed to assess early pregnancy. COMPARISON:  None. FINDINGS: Intrauterine gestational sac: Not visualized. Yolk sac:  Not visualized. Embryo:  Not visualized. Cardiac Activity: Not visualized. Maternal uterus/adnexae: Possible corpus luteum cyst seen in left ovary. Otherwise ovaries are unremarkable. Trace free fluid is noted which most likely is physiologic. IMPRESSION: No intrauterine gestational sac, yolk sac, fetal pole, or cardiac activity visualized. Differential considerations include intrauterine gestation too early to be sonographically visualized, spontaneous abortion, or ectopic pregnancy. Consider follow-up ultrasound in 14 days and serial quantitative beta HCG follow-up. Electronically Signed   By: Lupita RaiderJames  Green Jr M.D.   On: 07/11/2019 11:42    MAU Management/MDM: Orders Placed This Encounter  Procedures  . US OB Transvaginal  . Urinalysis, Routine w reflex microscopic  . hCG, quantitative,  pregnancy  . Discharge patient    No orders of the defined types were placed in this encounter.   Findings today could represent a normal early pregnancy, spontaneous abortion or ectopic pregnancy which can be life-threatening.  Ectopic precautions were given to the patient with plan to [facility] repeat quant hcg to evaluate pregnancy development.  With hcg on istat of 36 on 07/11/19 and hcg of 16 today, this is likely SAB but will get 48 hour hcg since initial lab was istat.   Pt discharged with strict return precautions.  ASSESSMENT 1. Pregnancy of unknown anatomic location   2. Vaginal bleeding in pregnancy, first trimester     PLAN Discharge home Allergies as of 07/15/2019   No Known Allergies     Medication List    STOP taking these medications   azithromycin 250 MG tablet Commonly known as: ZITHROMAX   metroNIDAZOLE 500 MG tablet Commonly known as: FLAGYL     TAKE these medications   Prenatal Complete 14-0.4 MG Tabs Take 1 tablet by mouth daily.   promethazine 25 MG tablet Commonly known as: PHENERGAN Take 1 tablet (25 mg total) by mouth every 6 (six) hours as needed for nausea or vomiting.      Follow-up Information    [facility].   Specialty: Obstetrics and Gynecology Why: For repeat labwork on Tuesday 07/17/19 at 8:30 am. Return to MAU with worsening symptoms. Contact information: [address], [address] [id number] [place] [id number] [phone]          Sharen CounterLisa Leftwich-Kirby Certified Nurse-Midwife 07/15/2019  3:31 AM

## 2019-07-17 ENCOUNTER — Telehealth: Payer: Self-pay | Admitting: Student

## 2019-07-17 ENCOUNTER — Ambulatory Visit (INDEPENDENT_AMBULATORY_CARE_PROVIDER_SITE_OTHER): Payer: Self-pay | Admitting: General Practice

## 2019-07-17 ENCOUNTER — Other Ambulatory Visit: Payer: Self-pay

## 2019-07-17 DIAGNOSIS — O3680X0 Pregnancy with inconclusive fetal viability, not applicable or unspecified: Secondary | ICD-10-CM

## 2019-07-17 LAB — BETA HCG QUANT (REF LAB): hCG Quant: 3 m[IU]/mL

## 2019-07-17 NOTE — Progress Notes (Signed)
Verified by name & DOB. Notified of today's results which is a negative HCG, consistent with SAB. Denies abdominal pain, vaginal bleeding, or fever. This was a desired pregnancy. Offered condolences. Instructed patient to call the office as needed.  Jorje Guild, NP

## 2019-07-17 NOTE — Telephone Encounter (Addendum)
Attempted to contact pt and was unable to LM due to VM box not set up.  MyChart message sent.

## 2019-07-17 NOTE — Progress Notes (Signed)
Patient presents to office today for stat bhcg, following up from MAU visit on 9/6. Patient reports her bleeding stopped yesterday and she denies pain today. Discussed with her we are monitoring her bhcg levels today, results will be reviewed with a provider in the office and we will call her with results/updated plan of care in approximately 2 hours. Patient verbalized understanding & provided call back number [phone].

## 2019-07-17 NOTE — Telephone Encounter (Signed)
The patient called in stating she would like a doctor's note regarding her days out of work. The patient stated she has been out of work since Friday. She also stated she is experiencing bad pain and is unsure if it's normal. She stated she would like to have a call back to discuss this. She has been taking otc pain medicine and its not working. Please message the front office with a return date after speaking to patient.   Thank you!!

## 2019-07-25 ENCOUNTER — Ambulatory Visit (HOSPITAL_COMMUNITY): Payer: Self-pay

## 2019-09-16 ENCOUNTER — Ambulatory Visit (HOSPITAL_COMMUNITY)
Admission: EM | Admit: 2019-09-16 | Discharge: 2019-09-16 | Disposition: A | Discharge status: Home / Self Care | Payer: Self-pay | Attending: Physician Assistant | Admitting: Physician Assistant

## 2019-09-16 ENCOUNTER — Encounter (HOSPITAL_COMMUNITY): Payer: Self-pay

## 2019-09-16 ENCOUNTER — Other Ambulatory Visit: Payer: Self-pay

## 2019-09-16 DIAGNOSIS — N9489 Other specified conditions associated with female genital organs and menstrual cycle: Secondary | ICD-10-CM | POA: Insufficient documentation

## 2019-09-16 LAB — POCT URINALYSIS DIP (DEVICE)
Bilirubin Urine: NEGATIVE
Glucose, UA: NEGATIVE mg/dL
Hgb urine dipstick: NEGATIVE
Ketones, ur: NEGATIVE mg/dL
Leukocytes,Ua: NEGATIVE
Nitrite: NEGATIVE
Protein, ur: NEGATIVE mg/dL
Specific Gravity, Urine: 1.015 (ref 1.005–1.030)
Urobilinogen, UA: 0.2 mg/dL (ref 0.0–1.0)
pH: 6 (ref 5.0–8.0)

## 2019-09-16 MED ORDER — DOXYCYCLINE HYCLATE 100 MG PO CAPS: 100.0000 mg | ORAL_CAPSULE | Freq: Two times a day (BID) | ORAL | 0 refills | Status: DC

## 2019-09-16 NOTE — ED Provider Notes (Signed)
[place]    CSN: [id number] Arrival date & time: 09/16/19  1211      History   Chief Complaint Chief Complaint  Patient presents with  . Cyst    HPI Terry [name] is a 23 y.o. female.   Pt complains of swelling in left labial swelling,  Pt reports she is worried that she has a boil.  Pt denies any std risk   The history is provided by the patient. No language interpreter was used.    Past Medical History:  Diagnosis Date  . Chlamydia   . Gonorrhea     Patient Active Problem List   Diagnosis Date Noted  . Normal labor 12/27/2016  . Cervical shortening affecting pregnancy 09/20/2016  . Supervision of normal first pregnancy 05/19/2016  . Pelvic pain in female 07/28/2015    Past Surgical History:  Procedure Laterality Date  . NO PAST SURGERIES      OB History    Gravida  2   Para  1   Term  1   Preterm      AB  1   Living  1     SAB  0   TAB      Ectopic      Multiple  0   Live Births  1        Obstetric Comments  Pt had an abortion on 07/15/2019.          Home Medications    Prior to Admission medications   Medication Sig Start Date End Date Taking? Authorizing Provider  doxycycline (VIBRAMYCIN) 100 MG capsule Take 1 capsule (100 mg total) by mouth 2 (two) times daily. 09/16/19   Fransico Meadow, PA-C  Prenatal Vit-Fe Fumarate-FA (PRENATAL COMPLETE) 14-0.4 MG TABS Take 1 tablet by mouth daily. 07/11/19   Khatri, Hina, PA-C  promethazine (PHENERGAN) 25 MG tablet Take 1 tablet (25 mg total) by mouth every 6 (six) hours as needed for nausea or vomiting. 01/10/19   Robyn Haber, MD    Family History Family History  Problem Relation Age of Onset  . Cancer Maternal Grandmother        pancreatic  . Diabetes Maternal Uncle   . Diabetes Mother   . Hypertension Mother     Social History Social History   Tobacco Use  . Smoking status: Never Smoker  . Smokeless tobacco: Never Used  Substance Use Topics  . Alcohol  use: No  . Drug use: No     Allergies   Patient has no known allergies.   Review of Systems Review of Systems  All other systems reviewed and are negative.    Physical Exam Triage Vital Signs ED Triage Vitals  Enc Vitals Group     BP 09/16/19 1228 110/69     Pulse Rate 09/16/19 1228 97     Resp 09/16/19 1228 16     Temp 09/16/19 1228 98.7 F (37.1 C)     Temp Source 09/16/19 1228 Oral     SpO2 09/16/19 1228 96 %     Weight --      Height --      Head Circumference --      Peak Flow --      Pain Score 09/16/19 1224 4     Pain Loc --      Pain Edu? --      Excl. in [place]? --    No data found.  Updated Vital Signs BP 110/69 (BP  Location: Right Arm)   Pulse 97   Temp 98.7 F (37.1 C) (Oral)   Resp 16   LMP 09/09/2019   SpO2 96%   Breastfeeding No   Visual Acuity Right Eye Distance:   Left Eye Distance:   Bilateral Distance:    Right Eye Near:   Left Eye Near:    Bilateral Near:     Physical Exam Vitals signs and nursing note reviewed.  Constitutional:      Appearance: She is well-developed.  HENT:     Head: Normocephalic.  Neck:     Musculoskeletal: Normal range of motion.  Cardiovascular:     Rate and Rhythm: Normal rate and regular rhythm.  Pulmonary:     Effort: Pulmonary effort is normal.  Abdominal:     General: There is no distension.  Genitourinary:    Comments: Swollen left labia,  No obvious abscess,  No ulcerations  Musculoskeletal: Normal range of motion.  Skin:    General: Skin is warm.  Neurological:     General: No focal deficit present.     Mental Status: She is alert and oriented to person, place, and time.  Psychiatric:        Mood and Affect: Mood normal.      UC Treatments / Results  Labs (all labs ordered are listed, but only abnormal results are displayed) Labs Reviewed  POCT URINALYSIS DIP (DEVICE)  CERVICOVAGINAL ANCILLARY ONLY    EKG   Radiology No results found.  Procedures Procedures (including  critical care time)  Medications Ordered in UC Medications - No data to display  Initial Impression / Assessment and Plan / UC Course  I have reviewed the triage vital signs and the nursing notes.  Pertinent labs & imaging results that were available during my care of the patient were reviewed by me and considered in my medical decision making (see chart for details).     MDM  I doubt std.  Area is swollen but no fluctuance  Final Clinical Impressions(s) / UC Diagnoses   Final diagnoses:  Labial swelling     Discharge Instructions     Return if any problems.  Soak area 20 minutes 4 times a day.  Recheck in 3 days if not improved/resolved    ED Prescriptions    Medication Sig Dispense Auth. Provider   doxycycline (VIBRAMYCIN) 100 MG capsule Take 1 capsule (100 mg total) by mouth 2 (two) times daily. 20 capsule Elson Areas, [place]     PDMP not reviewed this encounter.  An After Visit Summary was printed and given to the patient.    Elson Areas, [place] 09/16/19 1421

## 2019-09-16 NOTE — ED Triage Notes (Signed)
Pt states 3 days ago when she was urinating she felt a burning sensation in the skin around her genitalia. Pt states she has 1 bump around her anus and 1 bump around her vagina. Pt states having clear vaginal discharge and vaginal itchy x 2 days.

## 2019-09-16 NOTE — Discharge Instructions (Signed)
Return if any problems.  Soak area 20 minutes 4 times a day.  Recheck in 3 days if not improved/resolved

## 2019-09-17 ENCOUNTER — Telehealth: Payer: Self-pay | Admitting: Emergency Medicine

## 2019-09-17 MED ORDER — METRONIDAZOLE 500 MG PO TABS: 500.0000 mg | ORAL_TABLET | Freq: Two times a day (BID) | ORAL | 0 refills | Status: AC

## 2019-09-17 NOTE — Telephone Encounter (Signed)
Pt called requesting medication for BV due to odor. Claiborne Billings reviewed chart, okay to send medication. Pt agreeable to plan, all questions answered.

## 2019-09-17 NOTE — Addendum Note (Signed)
Addended by: Ocie Bob R on: 09/17/2019 02:03 PM   Modules accepted: Orders

## 2019-09-19 LAB — CERVICOVAGINAL ANCILLARY ONLY
Bacterial vaginitis: POSITIVE — AB
Chlamydia: NEGATIVE
Neisseria Gonorrhea: NEGATIVE
Trichomonas: NEGATIVE

## 2019-11-09 NOTE — L&D Delivery Note (Signed)
Delivery Note Pt pushed x 10 minutes. At 9:35 AM a viable and healthy female was delivered via  (Presentation: ROA). After delivery of head CNM was not able to deliver body with gentle downward pressure on head. Pt was not pushing due to distress. Suprapubic pressure applied by RN. Anterior shoulder rotated clockwise but still not able to delivery body. CNM grasped posterior (right) axilla and was able to deliver the body.There was a 40 second delay from delivery of head to delivery of body. Baby w/ good dry and tone after stimulation. Placed skin-to-skin w/ mom  APGAR: 8, 9; weight pending.   Placenta status: Spontaneous, Intact.  Cord: 3VC with the following complications: None.  Cord pH: NA  Anesthesia: None Episiotomy:  None Lacerations: None Suture Repair:  NA Est. Blood Loss (mL): 400 ml   Mom to postpartum.  Baby to Nursery. Placenta to: Path Feeding: Bottle Circ: NA Contraception: Undecided.   Explained that there is still little data on risk of COVID to baby, but that babies are at low risk for becoming infected. Engaged in shared decision-making discussion of having baby room-in w/ family masked vs baby staying in nursery. Pt elects to have baby in nursery.   Dorathy Kinsman 07/27/2020, 10:12 AM

## 2019-12-11 ENCOUNTER — Emergency Department (HOSPITAL_COMMUNITY): Admission: EM | Admit: 2019-12-11 | Discharge: 2019-12-11 | Discharge status: Against Medical Advice | Payer: Self-pay

## 2019-12-11 ENCOUNTER — Emergency Department (HOSPITAL_COMMUNITY): Payer: Medicaid Other

## 2019-12-11 ENCOUNTER — Encounter (HOSPITAL_COMMUNITY): Payer: Self-pay | Admitting: Obstetrics and Gynecology

## 2019-12-11 ENCOUNTER — Encounter (HOSPITAL_COMMUNITY): Payer: Self-pay | Admitting: Emergency Medicine

## 2019-12-11 ENCOUNTER — Other Ambulatory Visit: Payer: Self-pay

## 2019-12-11 ENCOUNTER — Emergency Department (HOSPITAL_COMMUNITY)
Admission: EM | Admit: 2019-12-11 | Discharge: 2019-12-11 | Disposition: A | Discharge status: Home / Self Care | Payer: Medicaid Other | Attending: Emergency Medicine | Admitting: Emergency Medicine

## 2019-12-11 ENCOUNTER — Inpatient Hospital Stay (HOSPITAL_COMMUNITY)
Admission: AD | Admit: 2019-12-11 | Discharge: 2019-12-11 | Disposition: A | Discharge status: Home / Self Care | Payer: Medicaid Other | Attending: Obstetrics and Gynecology | Admitting: Obstetrics and Gynecology

## 2019-12-11 DIAGNOSIS — R0789 Other chest pain: Secondary | ICD-10-CM | POA: Diagnosis not present

## 2019-12-11 DIAGNOSIS — R102 Pelvic and perineal pain: Secondary | ICD-10-CM | POA: Insufficient documentation

## 2019-12-11 DIAGNOSIS — R079 Chest pain, unspecified: Secondary | ICD-10-CM | POA: Diagnosis not present

## 2019-12-11 DIAGNOSIS — Z3201 Encounter for pregnancy test, result positive: Secondary | ICD-10-CM | POA: Insufficient documentation

## 2019-12-11 DIAGNOSIS — R0602 Shortness of breath: Secondary | ICD-10-CM

## 2019-12-11 DIAGNOSIS — Z79899 Other long term (current) drug therapy: Secondary | ICD-10-CM | POA: Insufficient documentation

## 2019-12-11 DIAGNOSIS — Z3A01 Less than 8 weeks gestation of pregnancy: Secondary | ICD-10-CM

## 2019-12-11 LAB — BASIC METABOLIC PANEL
Anion gap: 6 (ref 5–15)
BUN: 10 mg/dL (ref 6–20)
CO2: 25 mmol/L (ref 22–32)
Calcium: 9.1 mg/dL (ref 8.9–10.3)
Chloride: 105 mmol/L (ref 98–111)
Creatinine, Ser: 0.66 mg/dL (ref 0.44–1.00)
GFR calc Af Amer: >60 mL/min (ref >60)
GFR calc non Af Amer: >60 mL/min (ref >60)
Glucose, Bld: 88 mg/dL (ref 70–99)
Potassium: 4.1 mmol/L (ref 3.5–5.1)
Sodium: 136 mmol/L (ref 135–145)

## 2019-12-11 LAB — CBC
HCT: 43.2 % (ref 36.0–46.0)
Hemoglobin: 14.5 g/dL (ref 12.0–15.0)
MCH: 33.2 pg (ref 26.0–34.0)
MCHC: 33.6 g/dL (ref 30.0–36.0)
MCV: 98.9 fL (ref 80.0–100.0)
Platelets: 349 10*3/uL (ref 150–400)
RBC: 4.37 MIL/uL (ref 3.87–5.11)
RDW: 11.9 % (ref 11.5–15.5)
WBC: 9.8 10*3/uL (ref 4.0–10.5)
nRBC: 0 % (ref 0.0–0.2)

## 2019-12-11 LAB — D-DIMER, QUANTITATIVE: D-Dimer, Quant: 0.38 ug/mL-FEU (ref 0.00–0.50)

## 2019-12-11 LAB — URINALYSIS, ROUTINE W REFLEX MICROSCOPIC
Bilirubin Urine: NEGATIVE
Glucose, UA: NEGATIVE mg/dL
Hgb urine dipstick: NEGATIVE
Ketones, ur: NEGATIVE mg/dL
Leukocytes,Ua: NEGATIVE
Nitrite: NEGATIVE
Protein, ur: NEGATIVE mg/dL
Specific Gravity, Urine: 1.027 (ref 1.005–1.030)
pH: 6 (ref 5.0–8.0)

## 2019-12-11 LAB — I-STAT BETA HCG BLOOD, ED (MC, WL, AP ONLY): I-stat hCG, quantitative: >2000 m[IU]/mL — ABNORMAL HIGH (ref <5)

## 2019-12-11 LAB — POCT PREGNANCY, URINE: Preg Test, Ur: POSITIVE — AB

## 2019-12-11 LAB — TROPONIN I (HIGH SENSITIVITY): Troponin I (High Sensitivity): <2 ng/L (ref <18)

## 2019-12-11 MED ORDER — SODIUM CHLORIDE 0.9% FLUSH: 3.0000 mL | Freq: Once | INTRAVENOUS | Status: DC

## 2019-12-11 NOTE — ED Triage Notes (Signed)
Pt states that she has been having chest pain and night sweats x 3 weeks.  Points to mid sternum.  [facility]  LMP "beginning of January".  Denies cough.  Denies abd pain, NVD.

## 2019-12-11 NOTE — ED Notes (Signed)
This RN called by [facility] states that pt is trying to check in at [facility]. This Rn taking pt out of our system.

## 2019-12-11 NOTE — ED Provider Notes (Signed)
[facility]-EMERGENCY DEPT Provider Note   CSN: [id number] Arrival date & time: 12/11/19  1920     History Chief Complaint  Patient presents with  . Chest Pain    Terry [name] is a 24 y.o. female.  Patient here with chest pain that is been intermittent for the past 3 weeks.  States the pain last for about 5 or 6 seconds at a time in the center of her chest and does not radiate.  Happens about 5-6 times a day.  Associated with some shortness of breath and nausea.  She was seen at [facility] today for a positive pregnancy test.  She is not certain when her last period was but he believes it was in early January.  Denies any abdominal pain or vaginal bleeding.  She has not had an ultrasound of his pregnancy.  No cough or fever.  She does not have any chest pain currently.  She states the pain is sharp and stabbing last for just a few seconds at a time and is worse with breathing in. She reports the pain does not radiate to her arms, neck or back.  There is no associated abdominal pain, nausea, vomiting or sweating.  This is her third pregnancy and she has a history of 1 miscarriage.  The history is provided by the patient.  Chest Pain Associated symptoms: shortness of breath   Associated symptoms: no abdominal pain, no dizziness, no fever, no headache, no nausea, no palpitations, no vomiting and no weakness        Past Medical History:  Diagnosis Date  . Chlamydia   . Gonorrhea     Patient Active Problem List   Diagnosis Date Noted  . Normal labor 12/27/2016  . Cervical shortening affecting pregnancy 09/20/2016  . Supervision of normal first pregnancy 05/19/2016  . Pelvic pain in female 07/28/2015    Past Surgical History:  Procedure Laterality Date  . NO PAST SURGERIES       OB History    Gravida  3   Para  1   Term  1   Preterm      AB  1   Living  1     SAB  0   TAB  1   Ectopic      Multiple  0   Live Births  1           Family History  Problem Relation Age of Onset  . Cancer Maternal Grandmother        pancreatic  . Diabetes Maternal Uncle   . Diabetes Mother   . Hypertension Mother     Social History   Tobacco Use  . Smoking status: Never Smoker  . Smokeless tobacco: Never Used  Substance Use Topics  . Alcohol use: No  . Drug use: No    Home Medications Prior to Admission medications   Medication Sig Start Date End Date Taking? Authorizing Provider  doxycycline (VIBRAMYCIN) 100 MG capsule Take 1 capsule (100 mg total) by mouth 2 (two) times daily. Patient not taking: Reported on 12/11/2019 09/16/19   Elson Areas, PA-C  Prenatal Vit-Fe Fumarate-FA (PRENATAL COMPLETE) 14-0.4 MG TABS Take 1 tablet by mouth daily. Patient not taking: Reported on 12/11/2019 07/11/19   Dietrich Pates, PA-C  promethazine (PHENERGAN) 25 MG tablet Take 1 tablet (25 mg total) by mouth every 6 (six) hours as needed for nausea or vomiting. Patient not taking: Reported on 12/11/2019 01/10/19   Elvina Sidle,  MD    Allergies    Patient has no known allergies.  Review of Systems   Review of Systems  Constitutional: Negative for activity change, appetite change and fever.  HENT: Negative for congestion and rhinorrhea.   Eyes: Negative for visual disturbance.  Respiratory: Positive for chest tightness and shortness of breath.   Cardiovascular: Positive for chest pain. Negative for palpitations.  Gastrointestinal: Negative for abdominal pain, nausea and vomiting.  Genitourinary: Negative for dysuria, hematuria, vaginal bleeding and vaginal discharge.  Musculoskeletal: Negative for arthralgias and myalgias.  Neurological: Negative for dizziness, weakness and headaches.    all other systems are negative except as noted in the HPI and PMH.   Physical Exam Updated Vital Signs BP 119/84 (BP Location: Left Arm)   Pulse 89   Temp 98.1 F (36.7 C) (Oral)   Resp 17   Ht 5\' 10"  (1.778 m)   Wt 74.8 kg   LMP 11/09/2019    SpO2 100%   BMI 23.68 kg/m   Physical Exam Vitals and nursing note reviewed.  Constitutional:      General: She is not in acute distress.    Appearance: She is well-developed.  HENT:     Head: Normocephalic and atraumatic.     Mouth/Throat:     Pharynx: No oropharyngeal exudate.  Eyes:     Conjunctiva/sclera: Conjunctivae normal.     Pupils: Pupils are equal, round, and reactive to light.  Neck:     Comments: No meningismus. Cardiovascular:     Rate and Rhythm: Normal rate and regular rhythm.     Heart sounds: Normal heart sounds. No murmur.  Pulmonary:     Effort: Pulmonary effort is normal. No respiratory distress.     Breath sounds: Normal breath sounds.  Chest:     Chest wall: No tenderness.  Abdominal:     Palpations: Abdomen is soft.     Tenderness: There is no abdominal tenderness. There is no guarding or rebound.  Musculoskeletal:        General: No tenderness. Normal range of motion.     Cervical back: Normal range of motion and neck supple.  Skin:    General: Skin is warm.  Neurological:     Mental Status: She is alert and oriented to person, place, and time.     Cranial Nerves: No cranial nerve deficit.     Motor: No abnormal muscle tone.     Coordination: Coordination normal.     Comments: No ataxia on finger to nose bilaterally. No pronator drift. 5/5 strength throughout. CN 2-12 intact.Equal grip strength. Sensation intact.   Psychiatric:        Behavior: Behavior normal.     ED Results / Procedures / Treatments   Labs (all labs ordered are listed, but only abnormal results are displayed) Labs Reviewed  I-STAT BETA HCG BLOOD, ED ([facility], [facility], [facility] ONLY) - Abnormal; Notable for the following components:      Result Value   I-stat hCG, quantitative >2,000.0 (*)    All other components within normal limits  BASIC METABOLIC PANEL  CBC  D-DIMER, QUANTITATIVE (NOT AT [facility])  TROPONIN I (HIGH SENSITIVITY)  TROPONIN I (HIGH SENSITIVITY)    EKG EKG  Interpretation  Date/Time:  Tuesday December 11 2019 19:32:03 EST Ventricular Rate:  74 PR Interval:    QRS Duration: 83 QT Interval:  365 QTC Calculation: 405 R Axis:   86 Text Interpretation: Sinus rhythm RSR' in V1 or V2, right VCD or RVH  Baseline wander in lead(s) V3 No significant change was found Confirmed by Ezequiel Essex [phone]) on 12/11/2019 9:00:05 PM   Radiology DG Chest 2 View  Result Date: 12/11/2019 CLINICAL DATA:  24 year old female with chest pain. EXAM: CHEST - 2 VIEW COMPARISON:  Chest radiograph dated 07/07/2017. FINDINGS: The heart size and mediastinal contours are within normal limits. Both lungs are clear. The visualized skeletal structures are unremarkable. IMPRESSION: No active cardiopulmonary disease. Electronically Signed   By: Anner Crete M.D.   On: 12/11/2019 21:21    Procedures Procedures (including critical care time)  Medications Ordered in ED Medications  sodium chloride flush (NS) 0.9 % injection 3 mL (has no administration in time range)    ED Course  I have reviewed the triage vital signs and the nursing notes.  Pertinent labs & imaging results that were available during my care of the patient were reviewed by me and considered in my medical decision making (see chart for details).    MDM Rules/Calculators/A&P                     Patient with intermittent chest pain for the past 3 weeks that is very brief and comes and goes lasting for several seconds at a time.  She is pregnant approximately 4 weeks.  EKG is sinus rhythm  Chest x-ray is negative.  Troponin is negative.  Low suspicion for ACS, PE, or dissection.  Description of pain not consistent with ACS. D-dimer is negative so there is low suspicion for PE as well.  Patient with no abdominal pain or vaginal bleeding.  Discussed need for follow-up with her gynecologist for ultrasound to confirm pregnancy location.  Discussed reassuring work-up today.  Discussed symptomatic  treatment and PCP follow-up.  Return to the ED if chest pain becomes exertional, associated shortness of breath, sweating, vomiting or other concerns. Final Clinical Impression(s) / ED Diagnoses Final diagnoses:  Atypical chest pain  Less than [redacted] weeks gestation of pregnancy    Rx / DC Orders ED Discharge Orders    None       Shunta Mclaurin, Annie Main, MD 12/12/19 [phone]

## 2019-12-11 NOTE — MAU Provider Note (Signed)
Chief Complaint: Shortness of Breath and Chest Pain   First Provider Initiated Contact with Patient 12/11/19 1504      SUBJECTIVE HPI: [name] is a 24 y.o. G3P1011 who presents to MAU for chest pain and SOB x 2 days. She also reports having hot flashes. She had a (+) HPT last night. She denies any abdominal pain, vaginal bleeding. No h/o asthma and no known COVID-19 exposure.   Past Medical History:  Diagnosis Date  . Chlamydia   . Gonorrhea    Past Surgical History:  Procedure Laterality Date  . NO PAST SURGERIES     Social History   Socioeconomic History  . Marital status: Single    Spouse name: Not on file  . Number of children: Not on file  . Years of education: Not on file  . Highest education level: Not on file  Occupational History  . Not on file  Tobacco Use  . Smoking status: Never Smoker  . Smokeless tobacco: Never Used  Substance and Sexual Activity  . Alcohol use: No  . Drug use: No  . Sexual activity: Yes    Birth control/protection: None    Comment: 1 partner X 2 years  Other Topics Concern  . Not on file  Social History Narrative  . Not on file   Social Determinants of Health   Financial Resource Strain:   . Difficulty of Paying Living Expenses: Not on file  Food Insecurity:   . Worried About Charity fundraiser in the Last Year: Not on file  . Ran Out of Food in the Last Year: Not on file  Transportation Needs:   . Lack of Transportation (Medical): Not on file  . Lack of Transportation (Non-Medical): Not on file  Physical Activity:   . Days of Exercise per Week: Not on file  . Minutes of Exercise per Session: Not on file  Stress:   . Feeling of Stress : Not on file  Social Connections:   . Frequency of Communication with Friends and Family: Not on file  . Frequency of Social Gatherings with Friends and Family: Not on file  . Attends Religious Services: Not on file  . Active Member of Clubs or Organizations: Not on file  .  Attends Archivist Meetings: Not on file  . Marital Status: Not on file  Intimate Partner Violence:   . Fear of Current or Ex-Partner: Not on file  . Emotionally Abused: Not on file  . Physically Abused: Not on file  . Sexually Abused: Not on file   No current facility-administered medications on file prior to encounter.   Current Outpatient Medications on File Prior to Encounter  Medication Sig Dispense Refill  . doxycycline (VIBRAMYCIN) 100 MG capsule Take 1 capsule (100 mg total) by mouth 2 (two) times daily. 20 capsule 0  . Prenatal Vit-Fe Fumarate-FA (PRENATAL COMPLETE) 14-0.4 MG TABS Take 1 tablet by mouth daily. 60 tablet 0  . promethazine (PHENERGAN) 25 MG tablet Take 1 tablet (25 mg total) by mouth every 6 (six) hours as needed for nausea or vomiting. 10 tablet 0   No Known Allergies  ROS:  Review of Systems  Constitutional: Negative.   HENT: Negative.   Eyes: Negative.   Respiratory: Positive for shortness of breath.   Cardiovascular: Positive for chest pain.  Gastrointestinal: Negative.   Endocrine: Negative.   Genitourinary: Negative.   Musculoskeletal: Negative.   Skin: Negative.   Allergic/Immunologic: Negative.   Neurological: Negative.  Hematological: Negative.   Psychiatric/Behavioral: Negative.     I have reviewed patient's Past Medical Hx, Surgical Hx, Family Hx, Social Hx, medications and allergies.   Physical Exam   Patient Vitals for the past 24 hrs:  BP Temp Temp src Pulse Resp SpO2 Height Weight  12/11/19 1455 117/69 98.6 F (37 C) Oral 86 20 100 % -- --  12/11/19 1449 -- -- -- -- -- -- 5\' 10"  (1.778 m) 75.1 kg   Physical Exam  Nursing note and vitals reviewed. Constitutional: She is oriented to person, place, and time. She appears well-developed and well-nourished.  HENT:  Head: Normocephalic and atraumatic.  Eyes: Pupils are equal, round, and reactive to light.  Cardiovascular: Normal rate.  Respiratory: Effort normal.   Genitourinary:    Genitourinary Comments: Not indicated   Musculoskeletal:        General: Normal range of motion.     Cervical back: Normal range of motion.  Neurological: She is alert and oriented to person, place, and time.  Psychiatric: She has a normal mood and affect. Her behavior is normal. Judgment and thought content normal.    MDM Patient advised to seek care at the [facility] for complaint of CP, SOB and hot flashes. Explained that MAU is for emergent and non-emergent OB complaints.  ASSESSMENT MSE Complete -- stable for transfer to [facility]  PLAN Discharge patient at her request to seek medical care at [facility]. Patient stated she "doesn't have time to go to [facility], because she needed to pick her child up at 5:00 PM." Informed that after medical screening, it is recommended that she be further evaluated at [facility]. Advised that her condition can worsen, if she does not seek the proper care. Patient verbalized an understanding.   [facility], CNM 12/11/2019 3:06 PM

## 2019-12-11 NOTE — MAU Note (Signed)
Presents with c/o SOB and chest pain that began 2 days ago.  Reports also having hot flashes.  +UPT last night, confirmed in MAU today.  Denies VB.

## 2019-12-11 NOTE — Discharge Instructions (Addendum)
There is no evidence of heart attack or blood clot in the lung.  Follow-up with your obstetrician for an ultrasound for this pregnancy.  Return to the ED if you develop chest pain with exertion, shortness of breath, nausea, vomiting, sweating or other concerns.

## 2019-12-11 NOTE — Progress Notes (Signed)
Pt medically screened by R. Arita Miss, CNM, informed she may go to [facility] ED for further evaluation of SOB and chest pain.  Pt states she's not going to ED.  Left MAU ambulatory in stable condition.

## 2019-12-11 NOTE — ED Notes (Signed)
[facility]. She states while she was there she began to have chest pain and was transferred to [facility]. Pt states she was not seen at [facility] as she was waiting in their triage for several hours so she decided to leave and come here to [facility] to be seen. Pt states she is actively having chest pain 5/10 that in non radiating.

## 2020-01-04 ENCOUNTER — Emergency Department: Admission: EM | Admit: 2020-01-04 | Discharge: 2020-01-04 | Discharge status: Against Medical Advice | Payer: Self-pay

## 2020-01-07 ENCOUNTER — Other Ambulatory Visit: Payer: Self-pay | Admitting: *Deleted

## 2020-01-07 DIAGNOSIS — F329 Major depressive disorder, single episode, unspecified: Secondary | ICD-10-CM

## 2020-01-07 DIAGNOSIS — F32A Depression, unspecified: Secondary | ICD-10-CM

## 2020-01-07 NOTE — Progress Notes (Signed)
Referral placed for Int Behavior therapy. See pt email encounter.

## 2020-01-08 ENCOUNTER — Other Ambulatory Visit: Payer: Self-pay

## 2020-01-08 ENCOUNTER — Ambulatory Visit (INDEPENDENT_AMBULATORY_CARE_PROVIDER_SITE_OTHER): Payer: PRIVATE HEALTH INSURANCE | Admitting: Licensed Clinical Social Worker

## 2020-01-08 DIAGNOSIS — F329 Major depressive disorder, single episode, unspecified: Secondary | ICD-10-CM

## 2020-01-08 DIAGNOSIS — O99345 Other mental disorders complicating the puerperium: Secondary | ICD-10-CM

## 2020-01-08 DIAGNOSIS — O99340 Other mental disorders complicating pregnancy, unspecified trimester: Secondary | ICD-10-CM

## 2020-01-09 NOTE — BH Specialist Note (Signed)
Integrated Behavioral Health Initial Visit  MRN: [id number] Name: Terry [name]  Number of Integrated Behavioral Health Clinician visits:: 1 Session Start time: 9:22am  Session End time: 9:55am Total time: 33   Type of Service: Integrated Behavioral Health- Individual  Interpretor:no  Interpretor Name and Language: none   Warm Hand Off Completed.       SUBJECTIVE: Terry [name] is a 24 y.o. female  Patient was referred by P.Constant MD  for depression Patient reports the following symptoms/concerns: angry, self isolation,  Feelings of hopelessness, and anhedonia Duration of problem: approx one year; Severity of problem: mild   OBJECTIVE: Mood: sad  and Affect: normal  Risk of harm to self or others: no risk of harm   LIFE CONTEXT: Family and Social: Lives in [place] with daughter. Terry [name] reports her mother helps occasionally. School/Work: Works full time at [employer] in [place]  Self-Care: Reports no self care techniques  Life Changes: adjusting to new pregnancy  GOALS ADDRESSED: Patient will: 1. Reduce symptoms of: depression  2. Increase knowledge and/or ability of: identify triggering symptoms   3. Demonstrate ability to: Patient wants to learn self managing symptoms without using medication assistance.   INTERVENTIONS: Interventions utilized: brief supportive counseling   Standardized Assessments completed:   ASSESSMENT: Patient currently experiencing major depressive disorder    Patient may benefit from integrated behavioral health collaboration with prenatal visits   PLAN: 1. Follow up with behavioral health clinician on : 01/25/2020 2. Behavioral recommendations: integrated behavioral health  3. Referral(s): none at this time  4. "From scale of 1-10, how likely are you to follow plan?":  Terry Saxon, LCSW

## 2020-01-14 ENCOUNTER — Encounter: Payer: Self-pay | Admitting: *Deleted

## 2020-01-14 ENCOUNTER — Telehealth: Payer: Self-pay | Admitting: Licensed Clinical Social Worker

## 2020-01-14 NOTE — Telephone Encounter (Signed)
CSW A. Felton Clinton return [name] missed phone call. [name] requested a note for time off from work due to not feeling well. I advised [name] [facility] will provide a note for Tuesday visit; however leave from work or intermit leave can not be determine at this because [name] has not seen a provider yet for her first ob visit. CSW A. Berk Pilot advise [name] to discuss leave option with provider during first ob visit.

## 2020-01-18 ENCOUNTER — Encounter (HOSPITAL_COMMUNITY): Payer: Self-pay | Admitting: Obstetrics and Gynecology

## 2020-01-18 ENCOUNTER — Inpatient Hospital Stay (HOSPITAL_COMMUNITY)
Admission: AD | Admit: 2020-01-18 | Discharge: 2020-01-18 | Disposition: A | Discharge status: Home / Self Care | Payer: No Typology Code available for payment source | Attending: Obstetrics and Gynecology | Admitting: Obstetrics and Gynecology

## 2020-01-18 ENCOUNTER — Ambulatory Visit (INDEPENDENT_AMBULATORY_CARE_PROVIDER_SITE_OTHER): Payer: PRIVATE HEALTH INSURANCE

## 2020-01-18 ENCOUNTER — Other Ambulatory Visit: Payer: Self-pay

## 2020-01-18 DIAGNOSIS — O23591 Infection of other part of genital tract in pregnancy, first trimester: Secondary | ICD-10-CM | POA: Insufficient documentation

## 2020-01-18 DIAGNOSIS — R300 Dysuria: Secondary | ICD-10-CM | POA: Insufficient documentation

## 2020-01-18 DIAGNOSIS — B9689 Other specified bacterial agents as the cause of diseases classified elsewhere: Secondary | ICD-10-CM | POA: Insufficient documentation

## 2020-01-18 DIAGNOSIS — M549 Dorsalgia, unspecified: Secondary | ICD-10-CM | POA: Diagnosis present

## 2020-01-18 DIAGNOSIS — O26891 Other specified pregnancy related conditions, first trimester: Secondary | ICD-10-CM | POA: Diagnosis not present

## 2020-01-18 DIAGNOSIS — Z3A10 10 weeks gestation of pregnancy: Secondary | ICD-10-CM

## 2020-01-18 DIAGNOSIS — Z3480 Encounter for supervision of other normal pregnancy, unspecified trimester: Secondary | ICD-10-CM | POA: Insufficient documentation

## 2020-01-18 LAB — URINALYSIS, ROUTINE W REFLEX MICROSCOPIC
Bilirubin Urine: NEGATIVE
Glucose, UA: NEGATIVE mg/dL
Hgb urine dipstick: NEGATIVE
Ketones, ur: NEGATIVE mg/dL
Nitrite: NEGATIVE
Protein, ur: 30 mg/dL — AB
Specific Gravity, Urine: 1.025 (ref 1.005–1.030)
pH: 8 (ref 5.0–8.0)

## 2020-01-18 LAB — WET PREP, GENITAL
Sperm: NONE SEEN
Trich, Wet Prep: NONE SEEN
Yeast Wet Prep HPF POC: NONE SEEN

## 2020-01-18 MED ORDER — METRONIDAZOLE 500 MG PO TABS: 500.0000 mg | ORAL_TABLET | Freq: Two times a day (BID) | ORAL | 0 refills | Status: DC

## 2020-01-18 NOTE — MAU Provider Note (Addendum)
History     CSN: [id number]  Arrival date and time: 01/18/20 5885   First Provider Initiated Contact with Patient 01/18/20 2019      Chief Complaint  Patient presents with  . Back Pain   23 y.o. G3P1011 @10 .0 weeks presenting with vaginal discharge, dysuria, and LBP. Sx started a few weeks ago. She was seen at [facility] and treated for BV and UTI. She finished all meds. She reports a thin green, sometimes white vaginal discharge. No itching or malodor. Having some dysuria but no other urinary sx. LBP is bilateral. No recent injury or heavy lifting but reports standing 10 hrs a day, 6 days a week for work.   OB History    Gravida  3   Para  1   Term  1   Preterm      AB  1   Living  1     SAB  0   TAB  1   Ectopic      Multiple  0   Live Births  1           Past Medical History:  Diagnosis Date  . Chlamydia   . Depression   . Gonorrhea     Past Surgical History:  Procedure Laterality Date  . NO PAST SURGERIES      Family History  Problem Relation Age of Onset  . Cancer Maternal Grandmother        pancreatic  . Diabetes Maternal Uncle   . Diabetes Mother   . Hypertension Mother   . Diabetes Father     Social History   Tobacco Use  . Smoking status: Never Smoker  . Smokeless tobacco: Never Used  Substance Use Topics  . Alcohol use: No  . Drug use: No    Allergies: No Known Allergies  No medications prior to admission.    Review of Systems  Gastrointestinal: Negative for abdominal pain.  Genitourinary: Positive for dysuria and vaginal discharge. Negative for frequency, hematuria, urgency and vaginal bleeding.   Physical Exam   Blood pressure 105/65, pulse 75, temperature 99 F (37.2 C), temperature source Oral, resp. rate 20, height 5\' 10"  (1.778 m), weight 77.2 kg, last menstrual period 11/09/2019.  Physical Exam  Nursing note and vitals reviewed. Constitutional: She is oriented to person, place, and time. She appears  well-developed and well-nourished. No distress.  HENT:  Head: Normocephalic and atraumatic.  Cardiovascular: Normal rate.  Respiratory: Effort normal.  GI: Soft. She exhibits no distension and no mass. There is no abdominal tenderness. There is no rebound, no guarding and no CVA tenderness.  Genitourinary:    Genitourinary Comments: External: no lesions or erythema Vagina: rugated, pink, moist, thick creamy light yellow discharge Cervix visually closed    Musculoskeletal:        General: Normal range of motion.     Cervical back: Normal range of motion. Normal.     Thoracic back: Normal.     Lumbar back: Normal.  Neurological: She is alert and oriented to person, place, and time.  Skin: Skin is warm and dry.  Psychiatric: She has a normal mood and affect.  FHT 166  Results for orders placed or performed during the hospital encounter of 01/18/20 (from the past 24 hour(s))  Urinalysis, Routine w reflex microscopic     Status: Abnormal   Collection Time: 01/18/20  7:44 PM  Result Value Ref Range   Color, Urine YELLOW YELLOW   APPearance HAZY (A) CLEAR  Specific Gravity, Urine 1.025 1.005 - 1.030   pH 8.0 5.0 - 8.0   Glucose, UA NEGATIVE NEGATIVE mg/dL   Hgb urine dipstick NEGATIVE NEGATIVE   Bilirubin Urine NEGATIVE NEGATIVE   Ketones, ur NEGATIVE NEGATIVE mg/dL   Protein, ur 30 (A) NEGATIVE mg/dL   Nitrite NEGATIVE NEGATIVE   Leukocytes,Ua TRACE (A) NEGATIVE   RBC / HPF 0-5 0 - 5 RBC/hpf   WBC, UA 0-5 0 - 5 WBC/hpf   Bacteria, UA MANY (A) NONE SEEN   Squamous Epithelial / LPF 6-10 0 - 5   Mucus PRESENT   Wet prep, genital     Status: Abnormal   Collection Time: 01/18/20  8:26 PM  Result Value Ref Range   Yeast Wet Prep HPF POC NONE SEEN NONE SEEN   Trich, Wet Prep NONE SEEN NONE SEEN   Clue Cells Wet Prep HPF POC PRESENT (A) NONE SEEN   WBC, Wet Prep HPF POC FEW (A) NONE SEEN   Sperm NONE SEEN    MAU Course  Procedures  MDM Labs ordered and reviewed. Declined  Tylenol and heating pad for back. UA with many bacteria, will send UC and treat if indicated. Pt reports using a "cream" for treatment foor BV, unclear if she was adequately treated, will treat with Flagyl po. Stable for discharge.  Assessment and Plan   1. [redacted] weeks gestation of pregnancy   2. Bacterial vaginosis   3. Musculoskeletal back pain    Discharge home Follow up at [facility] as scheduled Return precautions Rx Flagyl  Allergies as of 01/18/2020   No Known Allergies     Medication List    TAKE these medications   metroNIDAZOLE 500 MG tablet Commonly known as: FLAGYL Take 1 tablet (500 mg total) by mouth 2 (two) times daily.   Prenatal Complete 14-0.4 MG Tabs Take 1 tablet by mouth daily.       Donette Larry, CNM 01/19/2020, 8:22 AM

## 2020-01-18 NOTE — Progress Notes (Signed)
I connected with  Terry [name] on 01/18/20 by a video enabled telemedicine application and verified that I am speaking with the correct person using two identifiers.   OB History  Gravida Para Term Preterm AB Living  3 1 1   1 1   SAB TAB Ectopic Multiple Live Births  0 1   0 1    # Outcome Date GA Lbr Len/2nd Weight Sex Delivery Anes PTL Lv  3 Current           2 TAB 07/2019          1 Term 12/28/16 [redacted]w[redacted]d 07:57 / 02:31 8 lb 7.1 oz (3.83 kg) F Vag-Spont EPI  LIV   Assessment   Normal Pregnancy   Plan   F/u 01/24/20

## 2020-01-18 NOTE — Discharge Instructions (Signed)
Bacterial Vaginosis  Bacterial vaginosis is an infection of the vagina. It happens when too many normal germs (healthy bacteria) grow in the vagina. This infection puts you at risk for infections from sex (STIs). Treating this infection can lower your risk for some STIs. You should also treat this if you are pregnant. It can cause your baby to be born early. Follow these instructions at home: Medicines  Take over-the-counter and prescription medicines only as told by your doctor.  Take or use your antibiotic medicine as told by your doctor. Do not stop taking or using it even if you start to feel better. General instructions  If you your sexual partner is a woman, tell her that you have this infection. She needs to get treatment if she has symptoms. If you have a female partner, he does not need to be treated.  During treatment: ? Avoid sex. ? Do not douche. ? Avoid alcohol as told. ? Avoid breastfeeding as told.  Drink enough fluid to keep your pee (urine) clear or pale yellow.  Keep your vagina and butt (rectum) clean. ? Wash the area with warm water every day. ? Wipe from front to back after you use the toilet.  Keep all follow-up visits as told by your doctor. This is important. Preventing this condition  Do not douche.  Use only warm water to wash around your vagina.  Use protection when you have sex. This includes: ? Latex condoms. ? Dental dams.  Limit how many people you have sex with. It is best to only have sex with the same person (be monogamous).  Get tested for STIs. Have your partner get tested.  Wear underwear that is cotton or lined with cotton.  Avoid tight pants and pantyhose. This is most important in summer.  Do not use any products that have nicotine or tobacco in them. These include cigarettes and e-cigarettes. If you need help quitting, ask your doctor.  Do not use illegal drugs.  Limit how much alcohol you drink. Contact a doctor if:  Your  symptoms do not get better, even after you are treated.  You have more discharge or pain when you pee (urinate).  You have a fever.  You have pain in your belly (abdomen).  You have pain with sex.  Your bleed from your vagina between periods. Summary  This infection happens when too many germs (bacteria) grow in the vagina.  Treating this condition can lower your risk for some infections from sex (STIs).  You should also treat this if you are pregnant. It can cause early (premature) birth.  Do not stop taking or using your antibiotic medicine even if you start to feel better. This information is not intended to replace advice given to you by your health care provider. Make sure you discuss any questions you have with your health care provider. Document Revised: 10/07/2017 Document Reviewed: 07/10/2016 Elsevier Patient Education  2020 [employer]   Back Pain in Pregnancy Back pain during pregnancy is common. Back pain may be caused by several factors that are related to changes during your pregnancy. Follow these instructions at home: Managing pain, stiffness, and swelling      If directed, for sudden (acute) back pain, put ice on the painful area. ? Put ice in a plastic bag. ? Place a towel between your skin and the bag. ? Leave the ice on for 20 minutes, 2-3 times per day.  If directed, apply heat to the affected area before you  exercise. Use the heat source that your health care provider recommends, such as a moist heat pack or a heating pad. ? Place a towel between your skin and the heat source. ? Leave the heat on for 20-30 minutes. ? Remove the heat if your skin turns bright red. This is especially important if you are unable to feel pain, heat, or cold. You may have a greater risk of getting burned.  If directed, massage the affected area. Activity  Exercise as told by your health care provider. Gentle exercise is the best way to prevent or manage back  pain.  Listen to your body when lifting. If lifting hurts, ask for help or bend your knees. This uses your leg muscles instead of your back muscles.  Squat down when picking up something from the floor. Do not bend over.  Only use bed rest for short periods as told by your health care provider. Bed rest should only be used for the most severe episodes of back pain. Standing, sitting, and lying down  Do not stand in one place for long periods of time.  Use good posture when sitting. Make sure your head rests over your shoulders and is not hanging forward. Use a pillow on your lower back if necessary.  Try sleeping on your side, preferably the left side, with a pregnancy support pillow or 1-2 regular pillows between your legs. ? If you have back pain after a night's rest, your bed may be too soft. ? A firm mattress may provide more support for your back during pregnancy. General instructions  Do not wear high heels.  Eat a healthy diet. Try to gain weight within your health care provider's recommendations.  Use a maternity girdle, elastic sling, or back brace as told by your health care provider.  Take over-the-counter and prescription medicines only as told by your health care provider.  Work with a physical therapist or massage therapist to find ways to manage back pain. Acupuncture or massage therapy may be helpful.  Keep all follow-up visits as told by your health care provider. This is important. Contact a health care provider if:  Your back pain interferes with your daily activities.  You have increasing pain in other parts of your body. Get help right away if:  You develop numbness, tingling, weakness, or problems with the use of your arms or legs.  You develop severe back pain that is not controlled with medicine.  You have a change in bowel or bladder control.  You develop shortness of breath, dizziness, or you faint.  You develop nausea, vomiting, or  sweating.  You have back pain that is a rhythmic, cramping pain similar to labor pains. Labor pain is usually 1-2 minutes apart, lasts for about 1 minute, and involves a bearing down feeling or pressure in your pelvis.  You have back pain and your water breaks or you have vaginal bleeding.  You have back pain or numbness that travels down your leg.  Your back pain developed after you fell.  You develop pain on one side of your back.  You see blood in your urine.  You develop skin blisters in the area of your back pain. Summary  Back pain may be caused by several factors that are related to changes during your pregnancy.  Follow instructions as told by your health care provider for managing pain, stiffness, and swelling.  Exercise as told by your health care provider. Gentle exercise is the best way to prevent  or manage back pain.  Take over-the-counter and prescription medicines only as told by your health care provider.  Keep all follow-up visits as told by your health care provider. This is important. This information is not intended to replace advice given to you by your health care provider. Make sure you discuss any questions you have with your health care provider. Document Revised: 02/13/2019 Document Reviewed: 04/12/2018 Elsevier Patient Education  2020 [employer].

## 2020-01-18 NOTE — MAU Note (Signed)
PT SAYS HAD U/S ON 2-26 - AT [facility] - SO 10.4 WEEKS - EDC 08-11-2020.    SHE WENT THERE FOR BLEEDING-  TOLD UTI AND BV- TOOK ALL MED.   STILL HAS  BACK ACH AND VAG D/C.  APPOINTMENT IS 01-25-2020 FOR [facility]- WITH [facility].

## 2020-01-20 LAB — CULTURE, OB URINE

## 2020-01-25 ENCOUNTER — Telehealth (INDEPENDENT_AMBULATORY_CARE_PROVIDER_SITE_OTHER): Payer: PRIVATE HEALTH INSURANCE | Admitting: Licensed Clinical Social Worker

## 2020-01-25 ENCOUNTER — Encounter: Payer: PRIVATE HEALTH INSURANCE | Admitting: Nurse Practitioner

## 2020-01-25 DIAGNOSIS — O99340 Other mental disorders complicating pregnancy, unspecified trimester: Secondary | ICD-10-CM

## 2020-01-25 DIAGNOSIS — F32A Depression, unspecified: Secondary | ICD-10-CM

## 2020-01-25 DIAGNOSIS — F329 Major depressive disorder, single episode, unspecified: Secondary | ICD-10-CM

## 2020-01-25 NOTE — BH Specialist Note (Signed)
Integrated Behavioral Health Follow Up Visit  MRN: [id number] Name: Terry [name]  Number of Integrated Behavioral Health Clinician visits: 1/2 Session Start time: 9:50am  Session End time: 10:04am Total time: 14 mins   Type of Service: Integrated Behavioral Health- Individual Interpretor:no  Interpretor Name and Language: none  SUBJECTIVE: Terry [name] is a 24 y.o. female  Patient was referred by Chana Bode MD for depression. Patient reports the following symptoms/concerns: depression Duration of problem: ; Severity of problem: Mild  OBJECTIVE: Mood: Good and Affect: Congruent  Risk of harm to self or others: No risk of harm to self or others   LIFE CONTEXT: Family and Social: Lives with boyfriend and daughter  School/Work:  [employer] in [place]  Self-Care:  Life Changes: New pregnancy   GOALS ADDRESSED: Patient will: 1.  Reduce symptoms of:  Depression  2.  Increase knowledge and/or ability of:  Identify triggering symptoms  3.  Demonstrate ability to: self manage symptoms   INTERVENTIONS: Interventions utilized:  Brief supportive counseling  Standardized Assessments completed:   ASSESSMENT: Patient currently experiencing depression affecting pregnancy   Patient may benefit from integrated care  PLAN: 1. Follow up with behavioral health clinician on : three weeks  2. Behavioral recommendations: Ms. Handrich self reports desire for zoloft. CSW A. Emeril Stille informed Ms. Ladona Ridgel to discuss her desire zoloft with MD provider during next visit.  3. Referral(s): none  4. "From scale of 1-10, how likely are you to follow plan?":  Gwyndolyn Saxon, LCSW

## 2020-01-29 ENCOUNTER — Ambulatory Visit (INDEPENDENT_AMBULATORY_CARE_PROVIDER_SITE_OTHER): Payer: PRIVATE HEALTH INSURANCE | Admitting: Family Medicine

## 2020-01-29 ENCOUNTER — Other Ambulatory Visit: Payer: Self-pay

## 2020-01-29 ENCOUNTER — Encounter: Payer: Self-pay | Admitting: Family Medicine

## 2020-01-29 VITALS — BP 115/76 | HR 68 | Wt 171.2 lb

## 2020-01-29 DIAGNOSIS — Z113 Encounter for screening for infections with a predominantly sexual mode of transmission: Secondary | ICD-10-CM | POA: Diagnosis not present

## 2020-01-29 DIAGNOSIS — F32A Depression, unspecified: Secondary | ICD-10-CM | POA: Insufficient documentation

## 2020-01-29 DIAGNOSIS — Z1151 Encounter for screening for human papillomavirus (HPV): Secondary | ICD-10-CM | POA: Diagnosis not present

## 2020-01-29 DIAGNOSIS — Z124 Encounter for screening for malignant neoplasm of cervix: Secondary | ICD-10-CM

## 2020-01-29 DIAGNOSIS — N898 Other specified noninflammatory disorders of vagina: Secondary | ICD-10-CM

## 2020-01-29 DIAGNOSIS — O99340 Other mental disorders complicating pregnancy, unspecified trimester: Secondary | ICD-10-CM

## 2020-01-29 DIAGNOSIS — Z3480 Encounter for supervision of other normal pregnancy, unspecified trimester: Secondary | ICD-10-CM

## 2020-01-29 DIAGNOSIS — F329 Major depressive disorder, single episode, unspecified: Secondary | ICD-10-CM

## 2020-01-29 LAB — OB RESULTS CONSOLE GC/CHLAMYDIA: Gonorrhea: NEGATIVE

## 2020-01-29 MED ORDER — SERTRALINE HCL 25 MG PO TABS: 25.0000 mg | ORAL_TABLET | Freq: Every day | ORAL | 2 refills | Status: DC

## 2020-01-29 MED ORDER — BLOOD PRESSURE CUFF MISC: 1.0000 | 0 refills | Status: DC

## 2020-01-29 NOTE — Progress Notes (Signed)
Pt is here for initial OB appointment. Pt went to [facility] hospital last month for vaginal spotting and US showed live intrauterine pregnancy of 8 weeks 4 days on 01/04/20.

## 2020-01-29 NOTE — Addendum Note (Signed)
Addended by: Frutoso Chase on: 01/29/2020 10:30 AM   Modules accepted: Orders

## 2020-01-29 NOTE — Patient Instructions (Addendum)
BENEFITS OF BREASTFEEDING Many women wonder if they should breastfeed. Research shows that breast milk contains the perfect balance of vitamins, protein and fat that your baby needs to grow. It also contains antibodies that help your baby's immune system to fight off viruses and bacteria and can reduce the risk of sudden infant death syndrome (SIDS). In addition, the colostrum (a fluid secreted from the breast in the first few days after delivery) helps your newborn's digestive system to grow and function well. Breast milk is easier to digest than formula. Also, if your baby is born preterm, breast milk can help to reduce both short- and long-term health problems. BENEFITS OF BREASTFEEDING FOR MOM . Breastfeeding causes a hormone to be released that helps the uterus to contract and return to its normal size more quickly. . It aids in postpartum weight loss, reduces risk of breast and ovarian cancer, heart disease and rheumatoid arthritis. . It decreases the amount of bleeding after the baby is born. benefits of breastfeeding for baby . Provides comfort and nutrition . Protects baby against - Obesity - Diabetes - Asthma - Childhood cancers - Heart disease - Ear infections - Diarrhea - Pneumonia - Stomach problems - Serious allergies - Skin rashes . Promotes growth and development . Reduces the risk of baby having Sudden Infant Death Syndrome (SIDS) only breastmilk for the first 6 months . Protects baby against diseases/allergies . It's the perfect amount for tiny bellies . It restores baby's energy . Provides the best nutrition for baby . Giving water or formula can make baby more likely to get sick, decrease Mom's milk supply, make baby less content with breastfeeding Skin to Skin After delivery, the staff will place your baby on your chest. This helps with the following: . Regulates baby's temperature, breathing, heart rate and blood sugar . Increases Mom's milk supply . Promotes  bonding . Keeps baby and Mom calm and decreases baby's crying Rooming In Your baby will stay in your room with you for the entire time you are in the hospital. This helps with the following: . Allows Mom to learn baby's feeding cues - Fluttering eyes - Sucking on tongue or hand - Rooting (opens mouth and turns head) - Nuzzling into the breast - Bringing hand to mouth . Allows breastfeeding on demand (when your baby is ready) . Helps baby to be calm and content . Ensures a good milk supply . Prevents complications with breastfeeding . Allows parents to learn to care for baby . Allows you to request assistance with breastfeeding Importance of a good latch . Increases milk transfer to baby - baby gets enough milk . Ensures you have enough milk for your baby . Decreases nipple soreness . Don't use pacifiers and bottles - these cause baby to suck differently than breastfeeding . Promotes continuation of breastfeeding Risks of Formula Supplementation with Breastfeeding Giving your infant formula in addition to your breast-milk EXCEPT when medically necessary can lead to: Marland Kitchen Decreases your milk supply  . Loss of confidence in yourself for providing baby's nutrition  . Engorgement and possibly mastitis  . Asthma & allergies in the baby BREASTFEEDING FAQS How long should I breastfeed my baby? It is recommended that you provide your baby with breast milk only for the first 6 months and then continue for the first year and longer as desired. During the first few weeks after birth, your baby will need to feed 8-12 times every 24 hours, or every 2-3 hours. They will likely feed  for 15-30 minutes. How can I help my baby begin breastfeeding? Babies are born with an instinct to breastfeed. A healthy baby can begin breastfeeding right away without specific help. At the hospital, a nurse (or lactation consultant) will help you begin the process and will give you tips on good positioning. It may be  helpful to take a breastfeeding class before you deliver in order to know what to expect. How can I help my baby latch on? In order to assist your baby in latching-on, cup your breast in your hand and stroke your baby's lower lip with your nipple to stimulate your baby's rooting reflex. Your baby will look like he or she is yawning, at which point you should bring the baby towards your breast, while aiming the nipple at the roof of his or her mouth. Remember to bring the baby towards you and not your breast towards the baby. How can I tell if my baby is latched-on? Your baby will have all of your nipple and part of the dark area around the nipple in his or her mouth and your baby's nose will be touching your breast. You should see or hear the baby swallowing. If the baby is not latched-on properly, start the process over. To remove the suction, insert a clean finger between your breast and the baby's mouth. Should I switch breasts during feeding? After feeding on one side, switch the baby to your other breast. If he or she does not continue feeding - that is OK. Your baby will not necessarily need to feed from both breasts in a single feeding. On the next feeding, start with the other breast for efficiency and comfort. How can I tell if my baby is hungry? When your baby is hungry, they will nuzzle against your breast, make sucking noises and tongue motions and may put their hands near their mouth. Crying is a late sign of hunger, so you should not wait until this point. When they have received enough milk, they will unlatch from the breast. Is it okay to use a pacifier? Until your baby gets the hang of breastfeeding, experts recommend limiting pacifier usage. If you have questions about this, please contact your pediatrician. What can I do to ensure proper nutrition while breastfeeding? . Make sure that you support your own health and your baby's by eating a healthy, well-balanced diet . Your provider  may recommend that you continue to take your prenatal vitamin . Drink plenty of fluids. It is a good rule to drink one glass of water before or after feeding . Alcohol will remain in the breast milk for as long as it will remain in the blood stream. If you choose to have a drink, it is recommended that you wait at least 2 hours before feeding . Moderate amounts of caffeine are OK . Some over-the-counter or prescription medications are not recommended during breastfeeding. Check with your provider if you have questions What types of birth control methods are safe while breastfeeding? Progestin-only methods, including a daily pill, an IUD, the implant and the injection are safe while breastfeeding. Methods that contain estrogen (such as combination birth control pills, the vaginal ring and the patch) should not be used during the first month of breastfeeding as these can decrease your milk supply.  Safe Medications in Pregnancy   Acne:  Benzoyl Peroxide  Salicylic Acid   Backache/Headache:  Tylenol: 2 regular strength every 4 hours OR        2 Extra  strength every 6 hours   Colds/Coughs/Allergies:  Benadryl (alcohol free) 25 mg every 6 hours as needed  Breath right strips  Claritin  Cepacol throat lozenges  Chloraseptic throat spray  Cold-Eeze- up to three times per day  Cough drops, alcohol free  Flonase (by prescription only)  Guaifenesin  Mucinex  Robitussin DM (plain only, alcohol free)  Saline nasal spray/drops  Sudafed (pseudoephedrine) & Actifed * use only after [redacted] weeks gestation and if you do not have high blood pressure  Tylenol  Vicks Vaporub  Zinc lozenges  Zyrtec   Constipation:  Colace  Ducolax suppositories  Fleet enema  Glycerin suppositories  Metamucil  Milk of magnesia  Miralax  Senokot  Smooth move tea   Diarrhea:  Kaopectate  Imodium A-D   *NO pepto Bismol   Hemorrhoids:  Anusol  Anusol HC  Preparation H  Tucks   Indigestion:  Tums   Maalox  Mylanta  Zantac  Pepcid   Insomnia:  Benadryl (alcohol free) '25mg'$  every 6 hours as needed  Tylenol PM  Unisom, no Gelcaps   Leg Cramps:  Tums  MagGel   Nausea/Vomiting:  Bonine  Dramamine  Emetrol  Ginger extract  Sea bands  Meclizine  Nausea medication to take during pregnancy:  Unisom (doxylamine succinate 25 mg tablets) Take one tablet daily at bedtime. If symptoms are not adequately controlled, the dose can be increased to a maximum recommended dose of two tablets daily (1/2 tablet in the morning, 1/2 tablet mid-afternoon and one at bedtime).  Vitamin B6 '100mg'$  tablets. Take one tablet twice a day (up to 200 mg per day).   Skin Rashes:  Aveeno products  Benadryl cream or '25mg'$  every 6 hours as needed  Calamine Lotion  1% cortisone cream   Yeast infection:  Gyne-lotrimin 7  Monistat 7    **If taking multiple medications, please check labels to avoid duplicating the same active ingredients  **take medication as directed on the label  ** Do not exceed 4000 mg of tylenol in 24 hours  **Do not take medications that contain aspirin or ibuprofen         Considering Waterbirth? Guide for patients at [facility] [employer]  Why consider waterbirth?  . Gentle birth for babies . Less pain medicine used in labor . May allow for passive descent/less pushing . May reduce perineal tears  . More mobility and instinctive maternal position changes . Increased maternal relaxation . Reduced blood pressure in labor  Is waterbirth safe? What are the risks of infection, drowning or other complications?  . Infection: o Very low risk (3.7 % for tub vs 4.8% for bed) o 7 in 8000 waterbirths with documented infection o Poorly cleaned equipment most common cause o Slightly lower group B strep transmission rate  . Drowning o Maternal:  - Very low risk   - Related to seizures or fainting o Newborn:  - Very low risk. No evidence of increased risk of  respiratory problems in multiple large studies - Physiological protection from breathing under water - Avoid underwater birth if there are any fetal complications - Once baby's head is out of the water, keep it out.  . Birth complication o Some reports of cord trauma, but risk decreased by bringing baby to surface gradually o No evidence of increased risk of shoulder dystocia. Mothers can usually change positions faster in water than in a bed, possibly aiding the maneuvers to free the shoulder.   You must attend a Waterbirth class  at [facility]  3rd Wednesday of every month from 7-9pm  Free  Register by calling [phone] or online at [email or web address]  Bring [place] the certificate from the class to your prenatal appointment  Meet with a midwife at 36 weeks to see if you can still plan a waterbirth and to sign the consent.   If you plan a waterbirth at [facility], you will need to purchase the following:  Fish net  Bathing suit top (optional)  Long-handled mirror (optional)  Places to purchase or rent supplies:   GotWeb[employer].is for tub purchases and supplies  Waterbirthsolutions.com for tub purchases and supplies  [employer] (www.thelaborladies.com) $275 for tub rental/set-up & take down/kit   [employer] (http://www.fleming.com/.htm) Information regarding doulas (labor support) who provide pool rentals  Things that would prevent you from having a waterbirth:  Premature, <37wks  Previous cesarean birth  Presence of thick meconium-stained fluid  Multiple gestation (Twins, triplets, etc.)  Uncontrolled diabetes or gestational diabetes requiring medication  Hypertension requiring medication or diagnosis of pre-eclampsia  Heavy vaginal bleeding  Non-reassuring fetal heart rate  Active infection (MRSA, etc.). Group B Strep is NOT a contraindication for waterbirth.  If your labor  has to be induced and induction method requires continuous monitoring of the baby's heart rate  Other risks/issues identified by your obstetrical provider  Please remember that birth is unpredictable. Under certain unforeseeable circumstances your provider may advise against giving birth in the tub. These decisions will be made on a case-by-case basis and with the safety of you and your baby as our highest priority.

## 2020-01-29 NOTE — Progress Notes (Signed)
History:   Terry [name] is a 24 y.o. G3P1011 at [redacted]w[redacted]d by LMP being seen today for her first obstetrical visit.  Her obstetrical history is significant for mild cervical shortening in frist pregnancy with normal term delivery. Patient does intend to breast feed. Pregnancy history fully reviewed.  Patient reports no complaints. She did have some spotting at the end of February and went to [place] and had an US done without abnormalities. She has been visiting with Terry [name] and notes some depression symptoms. She has never been on medications before but felt she had post-partum depression after her last pregnancy. She is interested in starting a medication. No SI/HI. Mainly reports some anger, frustration, isolation and anhedonia.       HISTORY: OB History  Gravida Para Term Preterm AB Living  3 1 1  0 1 1  SAB TAB Ectopic Multiple Live Births  1 0 0 0 1    # Outcome Date GA Lbr Len/2nd Weight Sex Delivery Anes PTL Lv  3 Current           2 SAB 07/2019          1 Term 12/28/16 [redacted]w[redacted]d 07:57 / 02:31 8 lb 7.1 oz (3.83 kg) F Vag-Spont EPI  LIV     Name: Terry [name]     Apgar1: 8  Apgar5: 9    Last pap smear was done 05/19/2016 and was normal.  Past Medical History:  Diagnosis Date  . Chlamydia   . Depression   . Gonorrhea    Past Surgical History:  Procedure Laterality Date  . NO PAST SURGERIES     Family History  Problem Relation Age of Onset  . Cancer Maternal Grandmother        pancreatic  . Diabetes Maternal Uncle   . Diabetes Mother   . Hypertension Mother   . Diabetes Father    Social History   Tobacco Use  . Smoking status: Never Smoker  . Smokeless tobacco: Never Used  Substance Use Topics  . Alcohol use: No  . Drug use: No   No Known Allergies Current Outpatient Medications on File Prior to Visit  Medication Sig Dispense Refill  . Prenatal Vit-Fe Fumarate-FA (PRENATAL VITAMINS PO) Take by mouth.    . metroNIDAZOLE (FLAGYL) 500 MG tablet Take 1  tablet (500 mg total) by mouth 2 (two) times daily. (Patient not taking: Reported on 01/29/2020) 14 tablet 0  . Prenatal Vit-Fe Fumarate-FA (PRENATAL COMPLETE) 14-0.4 MG TABS Take 1 tablet by mouth daily. (Patient not taking: Reported on 01/29/2020) 60 tablet 0   No current facility-administered medications on file prior to visit.    Review of Systems Pertinent items noted in HPI and remainder of comprehensive ROS otherwise negative. Physical Exam:   Vitals:   01/29/20 0859  BP: 115/76  Pulse: 68  Weight: 171 lb 3.2 oz (77.7 kg)   Fetal Heart Rate (bpm): 160  CONSTITUTIONAL: Well-developed, well-nourished female in no acute distress.  HEENT:  Normocephalic, atraumatic. External right and left ear normal. No scleral icterus.  NECK: Normal range of motion SKIN: No rash noted. Not diaphoretic. No erythema. No pallor. MUSCULOSKELETAL: Normal range of motion. No edema noted. NEUROLOGIC: Alert and oriented to person, place, and time. Normal muscle tone coordination. No cranial nerve deficit noted. PSYCHIATRIC: Normal mood and affect. Normal behavior. Normal judgment and thought content. CARDIOVASCULAR: Normal heart rate noted RESPIRATORY: Effort  normal, no problems with respiration noted ABDOMEN: Soft, non-tender. PELVIC: Normal appearing external genitalia; normal  appearing vaginal mucosa and cervix.  No abnormal discharge noted.  Normal uterine size, no other palpable masses, no uterine or adnexal tenderness.  FHR: 160 bpm   Assessment:    Pregnancy: G3P1011 Patient Active Problem List   Diagnosis Date Noted  . Depression affecting pregnancy 01/29/2020  . Supervision of other normal pregnancy, antepartum 01/18/2020  . Cervical shortening affecting pregnancy 09/20/2016  . Supervision of normal first pregnancy 05/19/2016  . Pelvic pain in female 07/28/2015     Plan:  Terry [name] was seen today for initial prenatal visit.  Diagnoses and all orders for this visit:  Supervision of  other normal pregnancy, antepartum       - Care Everywhere shows [place] at [place] showing SIUP at [redacted]w[redacted]d on 01/04/2020; fitting with LMP; dating by LMP       - Hx of mild cervical shortening with 1st pregnancy but patient had normal term pregnancy -     Enroll Patient in Babyscripts -     Babyscripts Schedule Optimization -     Obstetric Panel, Including HIV -     Culture, OB Urine -     Genetic Screening -     Cervicovaginal ancillary only( [place]) -     [place] MFM OB COMP + 14 WK; Future -     Hepatitis C Antibody -     Cytology - PAP( [place]) - Interested in waterbirth; will have next visit scheduled with midwife; info given   Depression affecting pregnancy       -     sertraline (ZOLOFT) 25 MG tablet; Take 1 tablet (25 mg total) by mouth at bedtime.       - Zoloft initiated. Titrate as needed. Discussed side effects. Cont to follow with Terry [name].   Initial labs drawn. Continue prenatal vitamins. Genetic Screening discussed, First trimester screen, Quad screen and NIPS: ordered. Ultrasound discussed; fetal anatomic survey: ordered. Problem list reviewed and updated. The nature of [place] [facility] with multiple MDs and other Advanced Practice Providers was explained to patient; also emphasized that residents, students are part of our team. Routine obstetric precautions reviewed. Return in about 4 weeks (around 02/26/2020) for ROB; virtual okay .     Terry Birkenhead, MD [facility] Family Medicine Fellow, [facility] for [employer], [facility]

## 2020-01-30 LAB — OBSTETRIC PANEL, INCLUDING HIV
Antibody Screen: NEGATIVE
Basophils Absolute: 0 10*3/uL (ref 0.0–0.2)
Basos: 0 %
EOS (ABSOLUTE): 0.1 10*3/uL (ref 0.0–0.4)
Eos: 2 %
HIV Screen 4th Generation wRfx: NONREACTIVE
Hematocrit: 38.6 % (ref 34.0–46.6)
Hemoglobin: 13.2 g/dL (ref 11.1–15.9)
Hepatitis B Surface Ag: NEGATIVE
Immature Grans (Abs): 0.1 10*3/uL (ref 0.0–0.1)
Immature Granulocytes: 1 %
Lymphocytes Absolute: 1.5 10*3/uL (ref 0.7–3.1)
Lymphs: 18 %
MCH: 32.7 pg (ref 26.6–33.0)
MCHC: 34.2 g/dL (ref 31.5–35.7)
MCV: 96 fL (ref 79–97)
Monocytes Absolute: 0.6 10*3/uL (ref 0.1–0.9)
Monocytes: 6 %
Neutrophils Absolute: 6.4 10*3/uL (ref 1.4–7.0)
Neutrophils: 73 %
Platelets: 254 10*3/uL (ref 150–450)
RBC: 4.04 x10E6/uL (ref 3.77–5.28)
RDW: 11.7 % (ref 11.7–15.4)
RPR Ser Ql: NONREACTIVE
Rh Factor: POSITIVE
Rubella Antibodies, IGG: 16.9 index (ref >0.99)
WBC: 8.8 10*3/uL (ref 3.4–10.8)

## 2020-01-30 LAB — CERVICOVAGINAL ANCILLARY ONLY
Bacterial Vaginitis (gardnerella): NEGATIVE
Candida Glabrata: NEGATIVE
Candida Vaginitis: NEGATIVE
Chlamydia: NEGATIVE
Comment: NEGATIVE
Comment: NEGATIVE
Comment: NEGATIVE
Comment: NEGATIVE
Comment: NEGATIVE
Comment: NORMAL
Neisseria Gonorrhea: NEGATIVE
Trichomonas: NEGATIVE

## 2020-01-30 LAB — CYTOLOGY - PAP
Comment: NEGATIVE
Diagnosis: NEGATIVE
High risk HPV: NEGATIVE

## 2020-01-30 LAB — HEPATITIS C ANTIBODY: Hep C Virus Ab: <0.1 s/co ratio (ref 0.0–0.9)

## 2020-01-31 LAB — URINE CULTURE, OB REFLEX

## 2020-01-31 LAB — CULTURE, OB URINE

## 2020-02-11 ENCOUNTER — Encounter: Payer: Self-pay | Admitting: Obstetrics and Gynecology

## 2020-02-25 ENCOUNTER — Telehealth (INDEPENDENT_AMBULATORY_CARE_PROVIDER_SITE_OTHER): Payer: No Typology Code available for payment source | Admitting: Advanced Practice Midwife

## 2020-02-25 DIAGNOSIS — Z3480 Encounter for supervision of other normal pregnancy, unspecified trimester: Secondary | ICD-10-CM

## 2020-02-25 DIAGNOSIS — O26892 Other specified pregnancy related conditions, second trimester: Secondary | ICD-10-CM

## 2020-02-25 DIAGNOSIS — O26852 Spotting complicating pregnancy, second trimester: Secondary | ICD-10-CM

## 2020-02-25 DIAGNOSIS — F329 Major depressive disorder, single episode, unspecified: Secondary | ICD-10-CM

## 2020-02-25 DIAGNOSIS — O99342 Other mental disorders complicating pregnancy, second trimester: Secondary | ICD-10-CM

## 2020-02-25 DIAGNOSIS — O09299 Supervision of pregnancy with other poor reproductive or obstetric history, unspecified trimester: Secondary | ICD-10-CM | POA: Insufficient documentation

## 2020-02-25 DIAGNOSIS — F32A Depression, unspecified: Secondary | ICD-10-CM

## 2020-02-25 DIAGNOSIS — Z3A15 15 weeks gestation of pregnancy: Secondary | ICD-10-CM

## 2020-02-25 DIAGNOSIS — N898 Other specified noninflammatory disorders of vagina: Secondary | ICD-10-CM

## 2020-02-25 MED ORDER — BLOOD PRESSURE KIT DEVI: 1.0000 | 0 refills | Status: DC

## 2020-02-25 NOTE — Progress Notes (Signed)
I connected with  Terry [name] on 02/25/20 by a video enabled telemedicine application and verified that I am speaking with the correct person using two identifiers.   I discussed the limitations of evaluation and management by telemedicine. The patient expressed understanding and agreed to proceed.  [facility].  C/o greenish yellow discharge x 2-3 weeks. Denies odor.

## 2020-02-25 NOTE — Progress Notes (Signed)
OBSTETRICS PRENATAL VIRTUAL VISIT ENCOUNTER NOTE  Provider location: [facility] [place] at [place]   I connected with Darrelyn Hillock on 02/25/20 at  4:00 PM EDT by MyChart Video Encounter at home and verified that I am speaking with the correct person using two identifiers.   I discussed the limitations, risks, security and privacy concerns of performing an evaluation and management service virtually and the availability of in person appointments. I also discussed with the patient that there may be a patient responsible charge related to this service. The patient expressed understanding and agreed to proceed. Subjective:  Hedy Garro is a 24 y.o. G3P1011 at [redacted]w[redacted]d being seen today for ongoing prenatal care.  She is currently monitored for the following issues for this low-risk pregnancy and has Pelvic pain in female; Cervical shortening affecting pregnancy; Supervision of other normal pregnancy, antepartum; and Depression affecting pregnancy on their problem list.  Patient reports yellow/green vaginal discharge when wiping, spotting 1 week ago.  Contractions: Not present. Vag. Bleeding: None.   . Denies any leaking of fluid.   The following portions of the patient's history were reviewed and updated as appropriate: allergies, current medications, past family history, past medical history, past social history, past surgical history and problem list.   Objective:  There were no vitals filed for this visit.  Fetal Status:           General:  Alert, oriented and cooperative. Patient is in no acute distress.  Respiratory: Normal respiratory effort, no problems with respiration noted  Mental Status: Normal mood and affect. Normal behavior. Normal judgment and thought content.  Rest of physical exam deferred due to type of encounter  Imaging: No results found.  Assessment and Plan:  Pregnancy: G3P1011 at [redacted]w[redacted]d 1. Supervision of other normal pregnancy, antepartum  --Anticipatory guidance about next visits/weeks of pregnancy given. --Next visit as soon as possible this week due to symptoms (see below)  2. Depression affecting pregnancy --Recently split from baby's father, has stress at home --Started Zoloft 25 mg at new OB but feels jittery, can't focus at work on medication so has not taken it on most days.   --Discussed how medication works, how it needs to be taken daily x 4-6 weeks before symptoms improve.  --Plan for pt to take half tablet (12.5 mg) daily x 6 days then on day 7 start 25 mg daily to reduce side effects.  --Pt to see Seth Bake with [facility] again at next prenatal visit  3. Vaginal discharge during pregnancy in second trimester --No itching, burning, or odor.   --Cervicovaginal swab negative for STDs, and yeast and BV on 01/29/20  4. History of prior pregnancy with short cervix, currently pregnant   5. Spotting affecting pregnancy in second trimester --Spotting 1 week ago, none this week --Given pt hx of shortened cervix with term delivery, current vaginal discharge and recent spotting, will get pt in office this week for further evaluation.  Preterm labor symptoms and general obstetric precautions including but not limited to vaginal bleeding, contractions, leaking of fluid and fetal movement were reviewed in detail with the patient. I discussed the assessment and treatment plan with the patient. The patient was provided an opportunity to ask questions and all were answered. The patient agreed with the plan and demonstrated an understanding of the instructions. The patient was advised to call back or seek an in-person office evaluation/go to [facility] at [facility] for any urgent or concerning symptoms. Please refer to  After Visit Summary for other counseling recommendations.   I provided 10 minutes of face-to-face time during this encounter.  No follow-ups on file.  Future Appointments  Date Time Provider  Department Center  03/21/2020  9:45 AM WH-MFC [place] 4 WH-MFCUS MFC-US    Sharen Counter, CNM [facility] [employer], [facility]

## 2020-03-21 ENCOUNTER — Ambulatory Visit: Payer: Medicaid Other | Attending: Family Medicine

## 2020-03-21 ENCOUNTER — Other Ambulatory Visit: Payer: Self-pay

## 2020-03-21 DIAGNOSIS — Z363 Encounter for antenatal screening for malformations: Secondary | ICD-10-CM | POA: Diagnosis not present

## 2020-03-21 DIAGNOSIS — Z3A19 19 weeks gestation of pregnancy: Secondary | ICD-10-CM | POA: Diagnosis not present

## 2020-03-21 DIAGNOSIS — Z3480 Encounter for supervision of other normal pregnancy, unspecified trimester: Secondary | ICD-10-CM | POA: Insufficient documentation

## 2020-03-24 ENCOUNTER — Telehealth (INDEPENDENT_AMBULATORY_CARE_PROVIDER_SITE_OTHER): Payer: No Typology Code available for payment source | Admitting: Obstetrics

## 2020-03-24 ENCOUNTER — Encounter: Payer: Self-pay | Admitting: Obstetrics

## 2020-03-24 DIAGNOSIS — Z3480 Encounter for supervision of other normal pregnancy, unspecified trimester: Secondary | ICD-10-CM

## 2020-03-24 DIAGNOSIS — Z3A19 19 weeks gestation of pregnancy: Secondary | ICD-10-CM

## 2020-03-24 DIAGNOSIS — Z3483 Encounter for supervision of other normal pregnancy, third trimester: Secondary | ICD-10-CM

## 2020-03-24 NOTE — Progress Notes (Signed)
OBSTETRICS PRENATAL VIRTUAL VISIT ENCOUNTER NOTE  Provider location: [facility] for [facility]   I connected with Terry [name] on 03/24/20 at  8:45 AM EDT by MyChart Video Encounter at home and verified that I am speaking with the correct person using two identifiers.   I discussed the limitations, risks, security and privacy concerns of performing an evaluation and management service virtually and the availability of in person appointments. I also discussed with the patient that there may be a patient responsible charge related to this service. The patient expressed understanding and agreed to proceed. Subjective:  Terry [name] is a 24 y.o. G3P1011 at [redacted]w[redacted]d being seen today for ongoing prenatal care.  She is currently monitored for the following issues for this low-risk pregnancy and has Pelvic pain in female; Supervision of other normal pregnancy, antepartum; Depression affecting pregnancy; and History of prior pregnancy with short cervix, currently pregnant on their problem list.  Patient reports no complaints.  Contractions: Not present. Vag. Bleeding: None.  Movement: Present. Denies any leaking of fluid.   The following portions of the patient's history were reviewed and updated as appropriate: allergies, current medications, past family history, past medical history, past social history, past surgical history and problem list.   Objective:  There were no vitals filed for this visit.  Fetal Status:     Movement: Present     General:  Alert, oriented and cooperative. Patient is in no acute distress.  Respiratory: Normal respiratory effort, no problems with respiration noted  Mental Status: Normal mood and affect. Normal behavior. Normal judgment and thought content.  Rest of physical exam deferred due to type of encounter  Imaging: [place] MFM OB COMP + 14 WK  Result Date: 03/21/2020 ----------------------------------------------------------------------   OBSTETRICS REPORT                       (Signed Final 03/21/2020 12:21 pm) ---------------------------------------------------------------------- Patient Info  ID #:       [id number]                          D.O.B.:  1996/10/04 (23 yrs)  Name:       Terry [name]                Visit Date: 03/21/2020 12:05 pm ---------------------------------------------------------------------- Performed By  Attending:        Ma Rings MD         Ref. Address:     [address]                                                             [address] [phone]  [place]                                                             [address]  Performed By:     Eden Lathe BS      [facility]                                 Fetal Care  Referred By:      [facility] ---------------------------------------------------------------------- Orders  #  Description                           Code        Ordered By  1  [place] MFM OB COMP + 14 WK                76805.01    CHELSEA FAIR ----------------------------------------------------------------------  #  Order #                     Accession #                Episode #  1  [id number]                   [id number]                 [id number] ---------------------------------------------------------------------- Indications  Antenatal screening for malformations (low     Z36.3  risk NIPS)  [redacted] weeks gestation of pregnancy                Z3A.19 ---------------------------------------------------------------------- Fetal Evaluation  Num Of Fetuses:         1  Fetal Heart Rate(bpm):  145  Cardiac Activity:       Observed  Presentation:           Cephalic  Placenta:               Posterior  P. Cord Insertion:      Visualized  Amniotic Fluid  AFI FV:      Within normal limits                              Largest Pocket(cm)                               4.6 ---------------------------------------------------------------------- Biometry  BPD:      43.6  mm     G. Age:  19w 1d         34  %    CI:        68.08   %    70 - 86  FL/HC:      17.9   %    16.8 - 19.8  HC:       169   mm     G. Age:  19w 4d         40  %    HC/AC:      1.13        1.09 - 1.39  AC:      149.5  mm     G. Age:  20w 1d         66  %    FL/BPD:     69.3   %  FL:       30.2  mm     G. Age:  19w 2d         34  %    FL/AC:      20.2   %    20 - 24  HUM:      28.2  mm     G. Age:  19w 1d         39  %  CER:      19.5  mm     G. Age:  18w 5d         30  %  CM:        4.7  mm  Est. FW:     311  gm    0 lb 11 oz      56  % ---------------------------------------------------------------------- OB History  Gravidity:    3         Term:   1        Prem:   0        SAB:   1  TOP:          0       Ectopic:  0        Living: 1 ---------------------------------------------------------------------- Gestational Age  U/S Today:     19w 4d                                        EDD:   08/11/20  Best:          19w 4d     Det. By:  Loman Chroman         EDD:   08/11/20                                      (01/04/20) ---------------------------------------------------------------------- Anatomy  Cranium:               Appears normal         Aortic Arch:            Appears normal  Cavum:                 Appears normal         Ductal Arch:            Appears normal  Ventricles:            Appears normal         Diaphragm:              Appears normal  Choroid Plexus:        Appears normal  Stomach:                Appears normal, left                                                                        sided  Cerebellum:            Appears normal         Abdomen:                Appears normal  Posterior Fossa:       Appears normal         Abdominal Wall:         Appears nml (cord                                                                         insert, abd wall)  Nuchal Fold:           Appears normal         Cord Vessels:           Appears normal (3                                                                        vessel cord)  Face:                  Appears normal         Kidneys:                Appear normal                         (orbits and profile)  Lips:                  Appears normal         Bladder:                Appears normal  Thoracic:              Appears normal         Spine:                  Appears normal  Heart:                 Appears normal         Upper Extremities:      Appears normal                         (4CH, axis, and  situs)  RVOT:                  Appears normal         Lower Extremities:      Appears normal  LVOT:                  Appears normal  Other:  Heels/feet and open hands/5th digits visualized. Nasal bone          visualized. ---------------------------------------------------------------------- Cervix Uterus Adnexa  Cervix  Length:            3.9  cm.  Normal appearance by transabdominal scan.  Uterus  No abnormality visualized.  Cul De Sac  No free fluid seen.  Adnexa  No abnormality visualized. ---------------------------------------------------------------------- Comments  This patient was seen for a detailed fetal anatomy scan.  She had a cell free DNA test earlier in her pregnancy which  indicated a low risk for trisomy 86, 55, and 13.  The fetal growth and amniotic fluid level were appropriate for  her gestational age.  There were no obvious fetal anomalies noted on today's  ultrasound exam.  Anomalies may be missed due to technical limitations. If the  fetus is in a suboptimal position or maternal habitus is  increased, visualization of the fetus in the maternal uterus  may be impaired.  Follow up as indicated. ----------------------------------------------------------------------                   Ma Rings, MD Electronically Signed Final Report   03/21/2020 12:21 pm  ----------------------------------------------------------------------   Assessment and Plan:  Pregnancy: G3P1011 at [redacted]w[redacted]d 1. Supervision of other normal pregnancy, antepartum   Preterm labor symptoms and general obstetric precautions including but not limited to vaginal bleeding, contractions, leaking of fluid and fetal movement were reviewed in detail with the patient. I discussed the assessment and treatment plan with the patient. The patient was provided an opportunity to ask questions and all were answered. The patient agreed with the plan and demonstrated an understanding of the instructions. The patient was advised to call back or seek an in-person office evaluation/go to MAU at [facility] for any urgent or concerning symptoms. Please refer to After Visit Summary for other counseling recommendations.   I provided 10 minutes of face-to-face time during this encounter.  Return in about 4 weeks (around 04/21/2020) for MyChart.   Coral Ceo, MD Center for [facility], [facility] 03/24/2020

## 2020-04-16 NOTE — Progress Notes (Signed)
Pregnancy restriction letter posted to MyChart for pt.

## 2020-04-21 ENCOUNTER — Encounter: Payer: Self-pay | Admitting: Obstetrics

## 2020-04-21 ENCOUNTER — Telehealth (INDEPENDENT_AMBULATORY_CARE_PROVIDER_SITE_OTHER): Payer: No Typology Code available for payment source | Admitting: Obstetrics

## 2020-04-21 DIAGNOSIS — Z3A23 23 weeks gestation of pregnancy: Secondary | ICD-10-CM

## 2020-04-21 DIAGNOSIS — O09299 Supervision of pregnancy with other poor reproductive or obstetric history, unspecified trimester: Secondary | ICD-10-CM

## 2020-04-21 DIAGNOSIS — Z3480 Encounter for supervision of other normal pregnancy, unspecified trimester: Secondary | ICD-10-CM

## 2020-04-21 DIAGNOSIS — O26892 Other specified pregnancy related conditions, second trimester: Secondary | ICD-10-CM

## 2020-04-21 DIAGNOSIS — M549 Dorsalgia, unspecified: Secondary | ICD-10-CM

## 2020-04-21 DIAGNOSIS — Z8759 Personal history of other complications of pregnancy, childbirth and the puerperium: Secondary | ICD-10-CM

## 2020-04-21 MED ORDER — COMFORT FIT MATERNITY SUPP SM MISC: 0 refills | Status: DC

## 2020-04-21 NOTE — Progress Notes (Signed)
   OBSTETRICS PRENATAL VIRTUAL VISIT ENCOUNTER NOTE  Provider location: [facility]   I connected with Terry [name] on 04/21/20 at  8:30 AM EDT by MyChart Video Encounter at home and verified that I am speaking with the correct person using two identifiers.   I discussed the limitations, risks, security and privacy concerns of performing an evaluation and management service virtually and the availability of in person appointments. I also discussed with the patient that there may be a patient responsible charge related to this service. The patient expressed understanding and agreed to proceed. Subjective:  Terry [name] is a 24 y.o. G3P1011 at [redacted]w[redacted]d being seen today for ongoing prenatal care.  She is currently monitored for the following issues for this low-risk pregnancy and has Pelvic pain in female; Supervision of other normal pregnancy, antepartum; Depression affecting pregnancy; and History of prior pregnancy with short cervix, currently pregnant on their problem list.  Patient reports backache and pelvic pain and pressure.  Contractions: Not present. Vag. Bleeding: None.  Movement: Present. Denies any leaking of fluid.   The following portions of the patient's history were reviewed and updated as appropriate: allergies, current medications, past family history, past medical history, past social history, past surgical history and problem list.   Objective:  There were no vitals filed for this visit.  Fetal Status:     Movement: Present     General:  Alert, oriented and cooperative. Patient is in no acute distress.  Respiratory: Normal respiratory effort, no problems with respiration noted  Mental Status: Normal mood and affect. Normal behavior. Normal judgment and thought content.  Rest of physical exam deferred due to type of encounter  Imaging: No results found.  Assessment and Plan:  Pregnancy: G3P1011 at [redacted]w[redacted]d 1. Supervision of other normal  pregnancy, antepartum  2. History of prior pregnancy with short cervix, currently pregnant  3. Backache symptom Rx: - Elastic Bandages & Supports (COMFORT FIT MATERNITY SUPP SM) MISC; Wear as directed.  Dispense: 1 each; Refill: 0 - placed on modified bedrest   Preterm labor symptoms and general obstetric precautions including but not limited to vaginal bleeding, contractions, leaking of fluid and fetal movement were reviewed in detail with the patient. I discussed the assessment and treatment plan with the patient. The patient was provided an opportunity to ask questions and all were answered. The patient agreed with the plan and demonstrated an understanding of the instructions. The patient was advised to call back or seek an in-person office evaluation/go to [facility]U at [facility] for any urgent or concerning symptoms. Please refer to After Visit Summary for other counseling recommendations.   I provided 10 minutes of face-to-face time during this encounter.  Return in about 4 weeks (around 05/19/2020) for ROB, 2 hour OGTT.   Coral Ceo, MD Center for [facility], [facility]

## 2020-04-21 NOTE — Progress Notes (Signed)
Virtual ROB  CC:  Pt noted clear "Jelly like" vaginal discharge last week none today.  Pt states last week at work she had increased pelvic pain and pain buttock area.She was instructed to go home due to pain  Pt states her job took out of work her on leave . Pt requesting forms be completed pt states she has not been at her job long enough to have FMLA. Pt advised to discuss with provider.

## 2020-05-19 ENCOUNTER — Other Ambulatory Visit: Payer: Medicaid Other

## 2020-05-19 ENCOUNTER — Ambulatory Visit (INDEPENDENT_AMBULATORY_CARE_PROVIDER_SITE_OTHER): Payer: Medicaid Other | Admitting: Obstetrics

## 2020-05-19 ENCOUNTER — Other Ambulatory Visit: Payer: Self-pay

## 2020-05-19 ENCOUNTER — Encounter: Payer: Self-pay | Admitting: Obstetrics

## 2020-05-19 VITALS — BP 107/69 | HR 88 | Wt 184.3 lb

## 2020-05-19 DIAGNOSIS — Z8759 Personal history of other complications of pregnancy, childbirth and the puerperium: Secondary | ICD-10-CM

## 2020-05-19 DIAGNOSIS — Z23 Encounter for immunization: Secondary | ICD-10-CM

## 2020-05-19 DIAGNOSIS — Z3A27 27 weeks gestation of pregnancy: Secondary | ICD-10-CM

## 2020-05-19 DIAGNOSIS — Z3482 Encounter for supervision of other normal pregnancy, second trimester: Secondary | ICD-10-CM

## 2020-05-19 DIAGNOSIS — Z3480 Encounter for supervision of other normal pregnancy, unspecified trimester: Secondary | ICD-10-CM

## 2020-05-19 MED ORDER — TETANUS-DIPHTH-ACELL PERTUSSIS 5-2.5-18.5 LF-MCG/0.5 IM SUSP
0.5000 mL | Freq: Once | INTRAMUSCULAR | Status: AC
  Administered 2020-05-19: 0.5 mL via INTRAMUSCULAR

## 2020-05-19 NOTE — Progress Notes (Signed)
Subjective:  Terry [name] is a 24 y.o. G3P1011 at [redacted]w[redacted]d being seen today for ongoing prenatal care.  She is currently monitored for the following issues for this low-risk pregnancy and has Pelvic pain in female; Supervision of other normal pregnancy, antepartum; Depression affecting pregnancy; and History of prior pregnancy with short cervix, currently pregnant on their problem list.  Patient reports no complaints.  Contractions: Not present. Vag. Bleeding: None.  Movement: Present. Denies leaking of fluid.   The following portions of the patient's history were reviewed and updated as appropriate: allergies, current medications, past family history, past medical history, past social history, past surgical history and problem list. Problem list updated.  Objective:   Vitals:   05/19/20 0929  BP: 107/69  Pulse: 88  Weight: 184 lb 4.8 oz (83.6 kg)    Fetal Status:     Movement: Present     General:  Alert, oriented and cooperative. Patient is in no acute distress.  Skin: Skin is warm and dry. No rash noted.   Cardiovascular: Normal heart rate noted  Respiratory: Normal respiratory effort, no problems with respiration noted  Abdomen: Soft, gravid, appropriate for gestational age. Pain/Pressure: Absent     Pelvic:  Cervical exam deferred        Extremities: Normal range of motion.  Edema: None  Mental Status: Normal mood and affect. Normal behavior. Normal judgment and thought content.   Urinalysis:      Assessment and Plan:  Pregnancy: G3P1011 at [redacted]w[redacted]d  1. Supervision of other normal pregnancy, antepartum Rx: - Glucose Tolerance, 2 Hours w/1 Hour - CBC - HIV Antibody (routine testing w rflx) - RPR - Tdap (BOOSTRIX) injection 0.5 mL   Preterm labor symptoms and general obstetric precautions including but not limited to vaginal bleeding, contractions, leaking of fluid and fetal movement were reviewed in detail with the patient. Please refer to After Visit Summary for other  counseling recommendations.   Return in about 2 weeks (around 06/02/2020) for MyChart.   Brock Bad, MD  05/19/20

## 2020-05-19 NOTE — Progress Notes (Signed)
Patient presents for ROB and GTT. Patient has no other concerns today.

## 2020-05-20 ENCOUNTER — Other Ambulatory Visit: Payer: Self-pay | Admitting: Obstetrics

## 2020-05-20 DIAGNOSIS — O99019 Anemia complicating pregnancy, unspecified trimester: Secondary | ICD-10-CM

## 2020-05-20 LAB — GLUCOSE TOLERANCE, 2 HOURS W/ 1HR
Glucose, 1 hour: 109 mg/dL (ref 65–179)
Glucose, 2 hour: 138 mg/dL (ref 65–152)
Glucose, Fasting: 79 mg/dL (ref 65–91)

## 2020-05-20 LAB — CBC
Hematocrit: 31.9 % — ABNORMAL LOW (ref 34.0–46.6)
Hemoglobin: 10.7 g/dL — ABNORMAL LOW (ref 11.1–15.9)
MCH: 32.3 pg (ref 26.6–33.0)
MCHC: 33.5 g/dL (ref 31.5–35.7)
MCV: 96 fL (ref 79–97)
Platelets: 228 10*3/uL (ref 150–450)
RBC: 3.31 x10E6/uL — ABNORMAL LOW (ref 3.77–5.28)
RDW: 13.2 % (ref 11.7–15.4)
WBC: 12 10*3/uL — ABNORMAL HIGH (ref 3.4–10.8)

## 2020-05-20 LAB — RPR: RPR Ser Ql: NONREACTIVE

## 2020-05-20 LAB — HIV ANTIBODY (ROUTINE TESTING W REFLEX): HIV Screen 4th Generation wRfx: NONREACTIVE

## 2020-05-20 MED ORDER — FERROUS SULFATE 325 (65 FE) MG PO TABS: 325.0000 mg | ORAL_TABLET | Freq: Two times a day (BID) | ORAL | 5 refills | Status: DC

## 2020-06-02 ENCOUNTER — Telehealth (INDEPENDENT_AMBULATORY_CARE_PROVIDER_SITE_OTHER): Payer: No Typology Code available for payment source | Admitting: Nurse Practitioner

## 2020-06-02 ENCOUNTER — Encounter: Payer: Self-pay | Admitting: Nurse Practitioner

## 2020-06-02 DIAGNOSIS — F32A Depression, unspecified: Secondary | ICD-10-CM

## 2020-06-02 DIAGNOSIS — F329 Major depressive disorder, single episode, unspecified: Secondary | ICD-10-CM

## 2020-06-02 DIAGNOSIS — O26893 Other specified pregnancy related conditions, third trimester: Secondary | ICD-10-CM

## 2020-06-02 DIAGNOSIS — Z8759 Personal history of other complications of pregnancy, childbirth and the puerperium: Secondary | ICD-10-CM

## 2020-06-02 DIAGNOSIS — Z3480 Encounter for supervision of other normal pregnancy, unspecified trimester: Secondary | ICD-10-CM

## 2020-06-02 DIAGNOSIS — N898 Other specified noninflammatory disorders of vagina: Secondary | ICD-10-CM

## 2020-06-02 DIAGNOSIS — O99343 Other mental disorders complicating pregnancy, third trimester: Secondary | ICD-10-CM

## 2020-06-02 DIAGNOSIS — O09299 Supervision of pregnancy with other poor reproductive or obstetric history, unspecified trimester: Secondary | ICD-10-CM

## 2020-06-02 DIAGNOSIS — Z3A29 29 weeks gestation of pregnancy: Secondary | ICD-10-CM

## 2020-06-02 MED ORDER — SERTRALINE HCL 25 MG PO TABS: 25.0000 mg | ORAL_TABLET | Freq: Every day | ORAL | 2 refills | Status: DC

## 2020-06-02 NOTE — Progress Notes (Signed)
I connected with  Terry [name] on 06/02/20 by a video enabled telemedicine application and verified that I am speaking with the correct person using two identifiers.   I discussed the limitations of evaluation and management by telemedicine. The patient expressed understanding and agreed to proceed.   MyChart OB, have no complaints today.  She has not picked up her BP Cuff yet.

## 2020-06-02 NOTE — Progress Notes (Signed)
OBSTETRICS PRENATAL VIRTUAL VISIT ENCOUNTER NOTE  Provider location: [facility]   I connected with Terry [name] on 06/02/20 at  3:40 PM EDT by MyChart Video Encounter at home and verified that I am speaking with the correct person using two identifiers.   I discussed the limitations, risks, security and privacy concerns of performing an evaluation and management service virtually and the availability of in person appointments. I also discussed with the patient that there may be a patient responsible charge related to this service. The patient expressed understanding and agreed to proceed. Subjective:  Terry [name] is a 24 y.o. G3P1011 at [redacted]w[redacted]d being seen today for ongoing prenatal care.  She is currently monitored for the following issues for this low-risk pregnancy and has Supervision of other normal pregnancy, antepartum; Depression affecting pregnancy; and History of prior pregnancy with short cervix, currently pregnant on their problem list.  Patient reports having mucus discharge for several days and thinks it is her mucus plug.  Contractions: Irritability. Vag. Bleeding: None.  Movement: Present. Denies any leaking of fluid.   The following portions of the patient's history were reviewed and updated as appropriate: allergies, current medications, past family history, past medical history, past social history, past surgical history and problem list.   Objective:  There were no vitals filed for this visit.  Fetal Status:     Movement: Present     General:  Alert, oriented and cooperative. Patient is in no acute distress.  Respiratory: Normal respiratory effort, no problems with respiration noted  Mental Status: Normal mood and affect. Normal behavior. Normal judgment and thought content.  Rest of physical exam deferred due to type of encounter  Imaging: No results found.  Assessment and Plan:  Pregnancy: G3P1011 at [redacted]w[redacted]d 1. Supervision of  other normal pregnancy, antepartum Able to see client online prior to her virtual appointment time today. Does not have BP cuff and has not been checking BP Ordered [employer] app and discussed that childbirth and waterbirth info can be found on the app to register for her water birth class as she is still interested in a waterbirth.  - sertraline (ZOLOFT) 25 MG tablet; Take 1 tablet (25 mg total) by mouth at bedtime.  Dispense: 60 tablet; Refill: 2 - Enroll Patient in [employer] - [facility] AMB BABYSCRIPTS SCHEDULE OPTIMIZATION  2. History of prior pregnancy with short cervix, currently pregnant   3. Vaginal discharge during pregnancy in third trimester Client thinks she is losing her mucus plug and with previous history of short cervix, sent message to admin staff and discussed with client to come in tomorrow for further evaluation tomorrow.  Client in agreement.  4. Depression affecting pregnancy Taking Zoloft and needs refill - ordered and informed client that refills are on the prescription when her bottle is empty. Discussed with client that she needs to follow up with Sue Lush and sent Sue Lush a message that she needs further follow up (has seen her once but was not on Zoloft at the time)  Preterm labor symptoms and general obstetric precautions including but not limited to vaginal bleeding, contractions, leaking of fluid and fetal movement were reviewed in detail with the patient. I discussed the assessment and treatment plan with the patient. The patient was provided an opportunity to ask questions and all were answered. The patient agreed with the plan and demonstrated an understanding of the instructions. The patient was advised to call back or seek an in-person office evaluation/[facility] to [facility] for any urgent or concerning symptoms. Please refer to After Visit Summary for other counseling recommendations.   I provided 7 minutes of face-to-face time during this  encounter.  Return in about 1 day (around 06/03/2020) for provider visit to check vaginal discharge and in person ROB in 2 weeks.  Future Appointments  Date Time Provider Department Center  06/02/2020  3:40 PM Rileyann Florance, Brand Males, NP [facility] None    Currie Paris, NP Center for [employer], [facility]

## 2020-06-09 ENCOUNTER — Ambulatory Visit (INDEPENDENT_AMBULATORY_CARE_PROVIDER_SITE_OTHER): Payer: Medicaid Other | Admitting: Obstetrics and Gynecology

## 2020-06-09 ENCOUNTER — Other Ambulatory Visit: Payer: Self-pay

## 2020-06-09 VITALS — BP 111/69 | HR 96 | Wt 186.4 lb

## 2020-06-09 DIAGNOSIS — Z3480 Encounter for supervision of other normal pregnancy, unspecified trimester: Secondary | ICD-10-CM

## 2020-06-09 DIAGNOSIS — N949 Unspecified condition associated with female genital organs and menstrual cycle: Secondary | ICD-10-CM

## 2020-06-09 DIAGNOSIS — O09299 Supervision of pregnancy with other poor reproductive or obstetric history, unspecified trimester: Secondary | ICD-10-CM

## 2020-06-09 MED ORDER — ELASTIC BANDAGES & SUPPORTS MISC: 1.0000 | Freq: Every day | 0 refills | Status: DC | PRN

## 2020-06-09 NOTE — Patient Instructions (Signed)

## 2020-06-09 NOTE — Progress Notes (Signed)
   PRENATAL VISIT NOTE  Subjective:  Terry [name] is a 24 y.o. G3P1011 at [redacted]w[redacted]d being seen today for ongoing prenatal care.  She is currently monitored for the following issues for this low-risk pregnancy and has Supervision of other normal pregnancy, antepartum; Depression affecting pregnancy; History of prior pregnancy with short cervix, currently pregnant; and Round ligament pain on their problem list.  Patient doing well with no acute concerns today. She reports pain with movement c/w round ligament pain.  Contractions: Not present. Vag. Bleeding: None.  Movement: Present. Denies leaking of fluid.   The following portions of the patient's history were reviewed and updated as appropriate: allergies, current medications, past family history, past medical history, past social history, past surgical history and problem list. Problem list updated.  Objective:   Vitals:   06/09/20 1341  BP: 111/69  Pulse: 96  Weight: 186 lb 6.4 oz (84.6 kg)    Fetal Status: Fetal Heart Rate (bpm): 152   Movement: Present     General:  Alert, oriented and cooperative. Patient is in no acute distress.  Skin: Skin is warm and dry. No rash noted.   Cardiovascular: Normal heart rate noted  Respiratory: Normal respiratory effort, no problems with respiration noted  Abdomen: Soft, gravid, appropriate for gestational age.  Pain/Pressure: Present     Pelvic: Cervical exam performed      SSE: cervix visually closed and thick, normal amount of vaginal discharge  Extremities: Normal range of motion.  Edema: None  Mental Status:  Normal mood and affect. Normal behavior. Normal judgment and thought content.   Assessment and Plan:  Pregnancy: G3P1011 at [redacted]w[redacted]d  1. History of prior pregnancy with short cervix, currently pregnant No s/sx of preterm labor, normal vaginal discharge  2. Supervision of other normal pregnancy, antepartum Pt interested in waterbirth, tried to set up ob visit with midwife, pt states  she has signed up for the class  3. Round ligament pain  - Elastic Bandages & Supports MISC; 1 Device by Does not apply route daily as needed.  Dispense: 1 Device; Refill: 0  Preterm labor symptoms and general obstetric precautions including but not limited to vaginal bleeding, contractions, leaking of fluid and fetal movement were reviewed in detail with the patient.  Please refer to After Visit Summary for other counseling recommendations.   Return in about 2 weeks (around 06/23/2020) for ROB, in person.   Mariel Aloe, MD

## 2020-06-12 ENCOUNTER — Other Ambulatory Visit: Payer: Self-pay

## 2020-06-12 ENCOUNTER — Encounter (HOSPITAL_COMMUNITY): Payer: Self-pay | Admitting: Obstetrics & Gynecology

## 2020-06-12 ENCOUNTER — Inpatient Hospital Stay (HOSPITAL_COMMUNITY)
Admission: AD | Admit: 2020-06-12 | Discharge: 2020-06-12 | Disposition: A | Discharge status: Home / Self Care | Payer: Medicaid Other | Attending: Obstetrics & Gynecology | Admitting: Obstetrics & Gynecology

## 2020-06-12 DIAGNOSIS — O26893 Other specified pregnancy related conditions, third trimester: Secondary | ICD-10-CM | POA: Diagnosis not present

## 2020-06-12 DIAGNOSIS — O99343 Other mental disorders complicating pregnancy, third trimester: Secondary | ICD-10-CM | POA: Insufficient documentation

## 2020-06-12 DIAGNOSIS — N898 Other specified noninflammatory disorders of vagina: Secondary | ICD-10-CM | POA: Diagnosis not present

## 2020-06-12 DIAGNOSIS — R102 Pelvic and perineal pain: Secondary | ICD-10-CM | POA: Diagnosis not present

## 2020-06-12 DIAGNOSIS — Z3A30 30 weeks gestation of pregnancy: Secondary | ICD-10-CM | POA: Insufficient documentation

## 2020-06-12 DIAGNOSIS — O99891 Other specified diseases and conditions complicating pregnancy: Secondary | ICD-10-CM

## 2020-06-12 DIAGNOSIS — O4703 False labor before 37 completed weeks of gestation, third trimester: Secondary | ICD-10-CM | POA: Insufficient documentation

## 2020-06-12 DIAGNOSIS — Z3689 Encounter for other specified antenatal screening: Secondary | ICD-10-CM

## 2020-06-12 DIAGNOSIS — F329 Major depressive disorder, single episode, unspecified: Secondary | ICD-10-CM | POA: Insufficient documentation

## 2020-06-12 DIAGNOSIS — R109 Unspecified abdominal pain: Secondary | ICD-10-CM | POA: Diagnosis not present

## 2020-06-12 DIAGNOSIS — Z0371 Encounter for suspected problem with amniotic cavity and membrane ruled out: Secondary | ICD-10-CM

## 2020-06-12 DIAGNOSIS — N949 Unspecified condition associated with female genital organs and menstrual cycle: Secondary | ICD-10-CM

## 2020-06-12 DIAGNOSIS — Z79899 Other long term (current) drug therapy: Secondary | ICD-10-CM | POA: Diagnosis not present

## 2020-06-12 DIAGNOSIS — O479 False labor, unspecified: Secondary | ICD-10-CM

## 2020-06-12 DIAGNOSIS — Z3A12 12 weeks gestation of pregnancy: Secondary | ICD-10-CM

## 2020-06-12 LAB — URINALYSIS, ROUTINE W REFLEX MICROSCOPIC
Bilirubin Urine: NEGATIVE
Glucose, UA: NEGATIVE mg/dL
Hgb urine dipstick: NEGATIVE
Ketones, ur: 5 mg/dL — AB
Leukocytes,Ua: NEGATIVE
Nitrite: NEGATIVE
Protein, ur: 100 mg/dL — AB
Specific Gravity, Urine: 1.027 (ref 1.005–1.030)
pH: 5 (ref 5.0–8.0)

## 2020-06-12 LAB — WET PREP, GENITAL
Sperm: NONE SEEN
Trich, Wet Prep: NONE SEEN
Yeast Wet Prep HPF POC: NONE SEEN

## 2020-06-12 LAB — FETAL FIBRONECTIN: Fetal Fibronectin: NEGATIVE

## 2020-06-12 LAB — AMNISURE RUPTURE OF MEMBRANE (ROM) NOT AT ARMC: Amnisure ROM: NEGATIVE

## 2020-06-12 MED ORDER — LACTATED RINGERS IV BOLUS
1000.0000 mL | Freq: Once | INTRAVENOUS | Status: AC
  Administered 2020-06-12: 1000 mL via INTRAVENOUS

## 2020-06-12 NOTE — Discharge Instructions (Signed)
Round Ligament Pain  The round ligament is a cord of muscle and tissue that helps support the uterus. It can become a source of pain during pregnancy if it becomes stretched or twisted as the baby grows. The pain usually begins in the second trimester (13-28 weeks) of pregnancy, and it can come and go until the baby is delivered. It is not a serious problem, and it does not cause harm to the baby. Round ligament pain is usually a short, sharp, and pinching pain, but it can also be a dull, lingering, and aching pain. The pain is felt in the lower side of the abdomen or in the groin. It usually starts deep in the groin and moves up to the outside of the hip area. The pain may occur when you:  Suddenly change position, such as quickly going from a sitting to standing position.  Roll over in bed.  Cough or sneeze.  Do physical activity. Follow these instructions at home:   Watch your condition for any changes.  When the pain starts, relax. Then try any of these methods to help with the pain: ? Sitting down. ? Flexing your knees up to your abdomen. ? Lying on your side with one pillow under your abdomen and another pillow between your legs. ? Sitting in a warm bath for 15-20 minutes or until the pain goes away.  Take over-the-counter and prescription medicines only as told by your health care provider.  Move slowly when you sit down or stand up.  Avoid long walks if they cause pain.  Stop or reduce your physical activities if they cause pain.  Keep all follow-up visits as told by your health care provider. This is important. Contact a health care provider if:  Your pain does not go away with treatment.  You feel pain in your back that you did not have before.  Your medicine is not helping. Get help right away if:  You have a fever or chills.  You develop uterine contractions.  You have vaginal bleeding.  You have nausea or vomiting.  You have diarrhea.  You have pain  when you urinate. Summary  Round ligament pain is felt in the lower abdomen or groin. It is usually a short, sharp, and pinching pain. It can also be a dull, lingering, and aching pain.  This pain usually begins in the second trimester (13-28 weeks). It occurs because the uterus is stretching with the growing baby, and it is not harmful to the baby.  You may notice the pain when you suddenly change position, when you cough or sneeze, or during physical activity.  Relaxing, flexing your knees to your abdomen, lying on one side, or taking a warm bath may help to get rid of the pain.  Get help from your health care provider if the pain does not go away or if you have vaginal bleeding, nausea, vomiting, diarrhea, or painful urination. This information is not intended to replace advice given to you by your health care provider. Make sure you discuss any questions you have with your health care provider. Document Revised: 04/12/2018 Document Reviewed: 04/12/2018 Elsevier Patient Education  2020 Elsevier Inc.         Abdominal Pain During Pregnancy  Abdominal pain is common during pregnancy, and has many possible causes. Some causes are more serious than others, and sometimes the cause is not known. Abdominal pain can be a sign that labor is starting. It can also be caused by normal growth  and stretching of muscles and ligaments during pregnancy. Always tell your health care provider if you have any abdominal pain. Follow these instructions at home:  Do not have sex or put anything in your vagina until your pain goes away completely.  Get plenty of rest until your pain improves.  Drink enough fluid to keep your urine pale yellow.  Take over-the-counter and prescription medicines only as told by your health care provider.  Keep all follow-up visits as told by your health care provider. This is important. Contact a health care provider if:  Your pain continues or gets worse after  resting.  You have lower abdominal pain that: ? Comes and goes at regular intervals. ? Spreads to your back. ? Is similar to menstrual cramps.  You have pain or burning when you urinate. Get help right away if:  You have a fever or chills.  You have vaginal bleeding.  You are leaking fluid from your vagina.  You are passing tissue from your vagina.  You have vomiting or diarrhea that lasts for more than 24 hours.  Your baby is moving less than usual.  You feel very weak or faint.  You have shortness of breath.  You develop severe pain in your upper abdomen. Summary  Abdominal pain is common during pregnancy, and has many possible causes.  If you experience abdominal pain during pregnancy, tell your health care provider right away.  Follow your health care provider's home care instructions and keep all follow-up visits as directed. This information is not intended to replace advice given to you by your health care provider. Make sure you discuss any questions you have with your health care provider. Document Revised: 02/12/2019 Document Reviewed: 01/27/2017 Elsevier Patient Education  2020 [employer].        Preterm Labor and Birth Information  The normal length of a pregnancy is 39-41 weeks. Preterm labor is when labor starts before 37 completed weeks of pregnancy. What are the risk factors for preterm labor? Preterm labor is more likely to occur in women who:  Have certain infections during pregnancy such as a bladder infection, sexually transmitted infection, or infection inside the uterus (chorioamnionitis).  Have a shorter-than-normal cervix.  Have gone into preterm labor before.  Have had surgery on their cervix.  Are younger than age 74 or older than age 40.  Are African American.  Are pregnant with twins or multiple babies (multiple gestation).  Take street drugs or smoke while pregnant.  Do not gain enough weight while pregnant.  Became  pregnant shortly after having been pregnant. What are the symptoms of preterm labor? Symptoms of preterm labor include:  Cramps similar to those that can happen during a menstrual period. The cramps may happen with diarrhea.  Pain in the abdomen or lower back.  Regular uterine contractions that may feel like tightening of the abdomen.  A feeling of increased pressure in the pelvis.  Increased watery or bloody mucus discharge from the vagina.  Water breaking (ruptured amniotic sac). Why is it important to recognize signs of preterm labor? It is important to recognize signs of preterm labor because babies who are born prematurely may not be fully developed. This can put them at an increased risk for:  Long-term (chronic) heart and lung problems.  Difficulty immediately after birth with regulating body systems, including blood sugar, body temperature, heart rate, and breathing rate.  Bleeding in the brain.  Cerebral palsy.  Learning difficulties.  Death. These risks are highest  for babies who are born before 34 weeks of pregnancy. How is preterm labor treated? Treatment depends on the length of your pregnancy, your condition, and the health of your baby. It may involve:  Having a stitch (suture) placed in your cervix to prevent your cervix from opening too early (cerclage).  Taking or being given medicines, such as: ? Hormone medicines. These may be given early in pregnancy to help support the pregnancy. ? Medicine to stop contractions. ? Medicines to help mature the baby's lungs. These may be prescribed if the risk of delivery is high. ? Medicines to prevent your baby from developing cerebral palsy. If the labor happens before 34 weeks of pregnancy, you may need to stay in the hospital. What should I do if I think I am in preterm labor? If you think that you are going into preterm labor, call your health care provider right away. How can I prevent preterm labor in future  pregnancies? To increase your chance of having a full-term pregnancy:  Do not use any tobacco products, such as cigarettes, chewing tobacco, and e-cigarettes. If you need help quitting, ask your health care provider.  Do not use street drugs or medicines that have not been prescribed to you during your pregnancy.  Talk with your health care provider before taking any herbal supplements, even if you have been taking them regularly.  Make sure you gain a healthy amount of weight during your pregnancy.  Watch for infection. If you think that you might have an infection, get it checked right away.  Make sure to tell your health care provider if you have gone into preterm labor before. This information is not intended to replace advice given to you by your health care provider. Make sure you discuss any questions you have with your health care provider. Document Revised: 02/16/2019 Document Reviewed: 03/17/2016 Elsevier Patient Education  2020 Elsevier Inc.        Premature Rupture and Preterm Premature Rupture of Membranes  Rupture of membranes is when the membranes (amniotic sac) that hold your baby break open. This is commonly referred to as your "water breaking." If your water breaks before labor starts (prematurely), it is called premature rupture of membranes (PROM). If PROM occurs before 37 weeks of pregnancy, it is called preterm premature rupture of membranes (PPROM). Because the amniotic sac keeps infection out and performs other important functions, having the amniotic sac rupture before 37 weeks of pregnancy can lead to serious problems. It requires immediate attention from a health care provider. What are the causes? When PROM occurs at 37 weeks of pregnancy or later, it is usually caused by natural weakening of the membranes and friction caused by contractions. PPROM is usually caused by infection. In many cases, the cause is not known. What increases the risk of PPROM? The  following factors may make you more likely to have PPROM:  Infection.  Having had PPROM in a previous pregnancy.  Short cervical length.  Bleeding during the second or third trimester.  Low BMI, which is an estimate of body fat.  Smoking.  Using drugs.  Low socioeconomic status. What problems can be caused by PROM and PPROM? This condition creates health dangers for the mother and the baby. These include:  Delivering a premature baby.  Getting a serious infection of the placental tissues (chorioamnionitis).  Early detachment of the placenta from the uterus (placental abruption).  Compression of the umbilical cord.  Developing a serious infection after delivery. What are  the signs of PROM and PPROM? Signs of this condition include:  A sudden gush or slow leaking of fluid from the vagina.  Constant wet underwear. Sometimes, women mistake the leaking or wetness for urine, especially if the leak is slow and not a gush of fluid. If there is constant leaking or if your underwear continues to get wet, your membranes have likely ruptured. What should I do if I think my membranes have ruptured?  Call your health care provider right away.  You will need to go to the hospital immediately to be checked by a health care provider. What happens if I am diagnosed with PROM or PPROM? Once you arrive at the hospital, you will have tests done. A cervical exam will be done using a lubricated instrument (speculum) to check whether the cervix has softened or started to open (dilate).  If you are diagnosed with PROM, your labor may be started for you (you may be induced) within 24 hours if you are not having contractions.  If you are diagnosed with PPROM and you are not having contractions, you may be induced depending on your trimester. If you have PPROM:  You and your baby will be monitored closely for signs of infection or other complications.  You may be given: ? An antibiotic  medicine to lower the chances of developing an infection. ? A steroid medicine to help mature the baby's lungs more quickly. ? A medicine to help prevent cerebral palsy in your baby. ? A medicine to stop preterm labor.  You may be ordered to be on bed rest at home or in the hospital.  You may be induced if complications occur for you or the baby. Your treatment will depend on many factors, such as how many weeks you have been pregnant (how far along you are), the development of the baby, and other complications that may occur. This information is not intended to replace advice given to you by your health care provider. Make sure you discuss any questions you have with your health care provider. Document Revised: 02/16/2019 Document Reviewed: 05/31/2016 Elsevier Patient Education  2020 [employer]

## 2020-06-12 NOTE — MAU Note (Signed)
Keeps having a whole bunch of pain at bottom of stomach and vagina.  Pain is constant, currently has been going on for 40 min. Feeling pressure.  Keeps having to change underwear, doesn't know if peeing or leaking.

## 2020-06-12 NOTE — MAU Provider Note (Signed)
History     CSN: [id number]  Arrival date and time: 06/12/20 8832   First Provider Initiated Contact with Patient 06/12/20 1917      No chief complaint on file.  [name] is a 24 y.o. G3P1011 at [redacted]w[redacted]d who presents to MAU for lower abdominal pain and possible leaking of fluid. Patient denies intercourse in the past 24 hours. Patient reports she has already been given a prescription for a pregnancy support belt but has not yet picked it up.  Patient reports the leaking of fluid started this morning. Patient reports fluid looks like water when it is wet and white/yellow when it is dry. Patient reports the entire bottom part of her underwear were soaked this morning, causing her to need to change her underwear. Patient reports she changed her underwear about 3 times today. Patient also reports the back of her skirt was soaked at one point today and "it looked like I peed on myself." Patient denies any leaking since coming to MAU.  Abdominal Pain Onset: last night Location: lower abdomen/vagina Duration: <24hrs Character: "feels like stabbing in the vagina," intermittent, tightening of bottom part of stomach Aggravating/Associated: walking/per above Relieving: rest Treatment: Tylenol - helps briefly  Pt denies VB, decreased FM, vaginal discharge/odor/itching. Pt denies N/V, constipation, diarrhea, or urinary problems. Pt denies fever, chills, fatigue, sweating or changes in appetite. Pt denies SOB or chest pain. Pt denies dizziness, HA, light-headedness, weakness.  Problems this pregnancy include: anemia. Allergies? NKDA Current medications/supplements? Iron, Zoloft Prenatal care provider? [facility], next appt 06/23/2020   OB History    Gravida  3   Para  1   Term  1   Preterm      AB  1   Living  1     SAB  1   TAB  0   Ectopic      Multiple  0   Live Births  1           Past Medical History:  Diagnosis Date  . Chlamydia   . Depression   .  Gonorrhea     Past Surgical History:  Procedure Laterality Date  . NO PAST SURGERIES      Family History  Problem Relation Age of Onset  . Cancer Maternal Grandmother        pancreatic  . Diabetes Maternal Uncle   . Diabetes Mother   . Hypertension Mother   . Diabetes Father     Social History   Tobacco Use  . Smoking status: Never Smoker  . Smokeless tobacco: Never Used  Vaping Use  . Vaping Use: Never used  Substance Use Topics  . Alcohol use: No  . Drug use: No    Allergies: No Known Allergies  Medications Prior to Admission  Medication Sig Dispense Refill Last Dose  . ferrous sulfate 325 (65 FE) MG tablet Take 1 tablet (325 mg total) by mouth 2 (two) times daily with a meal. 60 tablet 5 06/12/2020 at Unknown time  . Prenatal Vit-Fe Fumarate-FA (PRENATAL COMPLETE) 14-0.4 MG TABS Take 1 tablet by mouth daily. 60 tablet 0 06/12/2020 at Unknown time  . sertraline (ZOLOFT) 25 MG tablet Take 1 tablet (25 mg total) by mouth at bedtime. 60 tablet 2 06/12/2020 at Unknown time  . Blood Pressure Monitoring (BLOOD PRESSURE CUFF) MISC 1 Device by Does not apply route once a week. 1 each 0   . Blood Pressure Monitoring (BLOOD PRESSURE KIT) DEVI 1 kit by Does not  apply route once a week. Check Blood Pressure regularly and record readings into the Babyscripts App.  Large Cuff.  DX O90.0 1 each 0   . Elastic Bandages & Supports MISC 1 Device by Does not apply route daily as needed. 1 Device 0   . Prenatal Vit-Fe Fumarate-FA (PRENATAL VITAMINS PO) Take by mouth. (Patient not taking: Reported on 05/19/2020)       Review of Systems  Constitutional: Negative for chills, diaphoresis, fatigue and fever.  Eyes: Negative for visual disturbance.  Respiratory: Negative for shortness of breath.   Cardiovascular: Negative for chest pain.  Gastrointestinal: Positive for abdominal pain. Negative for constipation, diarrhea, nausea and vomiting.  Genitourinary: Positive for vaginal discharge and vaginal  pain. Negative for dysuria, flank pain, frequency, pelvic pain, urgency and vaginal bleeding.  Neurological: Negative for dizziness, weakness, light-headedness and headaches.   Physical Exam   Blood pressure 119/66, pulse (!) 116, temperature 99.2 F (37.3 C), temperature source Oral, resp. rate 18, height '5\' 10"'$  (1.778 m), weight 83.4 kg, last menstrual period 11/09/2019, SpO2 99 %.  Patient Vitals for the past 24 hrs:  BP Temp Temp src Pulse Resp SpO2 Height Weight  06/12/20 1857 119/66 99.2 F (37.3 C) Oral (!) 116 18 99 % '5\' 10"'$  (1.778 m) 83.4 kg   Physical Exam Vitals and nursing note reviewed. Exam conducted with a chaperone present.  Constitutional:      General: She is not in acute distress.    Appearance: Normal appearance. She is not ill-appearing, toxic-appearing or diaphoretic.  HENT:     Head: Normocephalic and atraumatic.  Pulmonary:     Effort: Pulmonary effort is normal.  Abdominal:     Palpations: Abdomen is soft.  Genitourinary:    General: Normal vulva.     Labia:        Right: No rash, tenderness or lesion.        Left: No rash, tenderness or lesion.   Skin:    General: Skin is warm and dry.  Neurological:     Mental Status: She is alert and oriented to person, place, and time.  Psychiatric:        Mood and Affect: Mood normal.        Behavior: Behavior normal.        Thought Content: Thought content normal.        Judgment: Judgment normal.    Results for orders placed or performed during the hospital encounter of 06/12/20 (from the past 24 hour(s))  Urinalysis, Routine w reflex microscopic Urine, Clean Catch     Status: Abnormal   Collection Time: 06/12/20  7:00 PM  Result Value Ref Range   Color, Urine AMBER (A) YELLOW   APPearance HAZY (A) CLEAR   Specific Gravity, Urine 1.027 1.005 - 1.030   pH 5.0 5.0 - 8.0   Glucose, UA NEGATIVE NEGATIVE mg/dL   Hgb urine dipstick NEGATIVE NEGATIVE   Bilirubin Urine NEGATIVE NEGATIVE   Ketones, ur 5 (A)  NEGATIVE mg/dL   Protein, ur 100 (A) NEGATIVE mg/dL   Nitrite NEGATIVE NEGATIVE   Leukocytes,Ua NEGATIVE NEGATIVE   RBC / HPF 0-5 0 - 5 RBC/hpf   WBC, UA 0-5 0 - 5 WBC/hpf   Bacteria, UA FEW (A) NONE SEEN   Squamous Epithelial / LPF 0-5 0 - 5   Mucus PRESENT   Fetal fibronectin     Status: None   Collection Time: 06/12/20  7:45 PM  Result Value Ref Range  Fetal Fibronectin NEGATIVE NEGATIVE  Wet prep, genital     Status: Abnormal   Collection Time: 06/12/20  7:45 PM   Specimen: Vaginal  Result Value Ref Range   Yeast Wet Prep HPF POC NONE SEEN NONE SEEN   Trich, Wet Prep NONE SEEN NONE SEEN   Clue Cells Wet Prep HPF POC PRESENT (A) NONE SEEN   WBC, Wet Prep HPF POC MANY (A) NONE SEEN   Sperm NONE SEEN   Amnisure rupture of membrane (rom)not at [facility]     Status: None   Collection Time: 06/12/20  8:22 PM  Result Value Ref Range   Amnisure ROM NEGATIVE    No results found.  MAU Course  Procedures  MDM -r/o PTL -UA: amber/hazy/5 ketones, sending urine for culture based on symptoms -Dilation: 1 Effacement (%): Thick Cervical Position: Posterior Station: -3 Presentation: Vertex -fFN: negative -WetPrep: +ClueCells -GC/CT collected -EFM: reactive       -baseline: 130       -variability: moderate       -accels: present, 15x15       -decels: absent       -TOCO: few, irregular ctx, pt describes as cramping -reviewed tracing with Dr. Harolyn Rutherford, tracing satisfactory and patient does not need continuous monitoring while in MAU -1L LR given, after fluids cramping has resolved -cervix not rechecked d/t resolution of cramping  -r/o ROM -no leaking of fluid visible at introitus -no fluid on glove after exam -AmniSure: negative  -pt discharged to home in stable condition  Orders Placed This Encounter  Procedures  . Wet prep, genital    Standing Status:   Standing    Number of Occurrences:   1  . Culture, OB Urine    Standing Status:   Standing    Number of Occurrences:    1  . Urinalysis, Routine w reflex microscopic Urine, Clean Catch    Standing Status:   Standing    Number of Occurrences:   1  . Fetal fibronectin    Standing Status:   Standing    Number of Occurrences:   1  . Amnisure rupture of membrane (rom)not at [facility]    Standing Status:   Standing    Number of Occurrences:   1  . Insert peripheral IV    Standing Status:   Standing    Number of Occurrences:   1  . Discharge patient    Order Specific Question:   Discharge disposition    Answer:   01-Home or Self Care [1]    Order Specific Question:   Discharge patient date    Answer:   06/12/2020   Meds ordered this encounter  Medications  . lactated ringers bolus 1,000 mL    Assessment and Plan   1. Encounter for suspected premature rupture of amniotic membranes, with rupture of membranes not found   2. Round ligament pain   3. Labor, false (Braxton-Hicks)   4. [redacted] weeks gestation of pregnancy   5. NST (non-stress test) reactive   6. Vaginal discharge during pregnancy in third trimester     Allergies as of 06/12/2020   No Known Allergies     Medication List    TAKE these medications   Blood Pressure Cuff Misc 1 Device by Does not apply route once a week.   Blood Pressure Kit Devi 1 kit by Does not apply route once a week. Check Blood Pressure regularly and record readings into the Babyscripts App.  Large Cuff.  DX O90.0   Elastic Bandages & Supports Misc 1 Device by Does not apply route daily as needed.   ferrous sulfate 325 (65 FE) MG tablet Take 1 tablet (325 mg total) by mouth 2 (two) times daily with a meal.   PRENATAL VITAMINS PO Take by mouth.   Prenatal Complete 14-0.4 MG Tabs Take 1 tablet by mouth daily.   sertraline 25 MG tablet Commonly known as: Zoloft Take 1 tablet (25 mg total) by mouth at bedtime.       -will call with culture results, if positive -discussed appropriate hydration in pregnancy -discussed braxton-hicks vs. PTL -discussed s/sx of  ROM -encouraged to pick up pregnancy support belt -return MAU precautions given -pt discharged to home in stable condition  Elmyra Ricks E Basma Buchner 06/12/2020, 9:33 PM

## 2020-06-13 LAB — CULTURE, OB URINE: Culture: <10000 — AB

## 2020-06-13 LAB — GC/CHLAMYDIA PROBE AMP (~~LOC~~) NOT AT ARMC
Chlamydia: NEGATIVE
Comment: NEGATIVE
Comment: NORMAL
Neisseria Gonorrhea: NEGATIVE

## 2020-06-23 ENCOUNTER — Ambulatory Visit (INDEPENDENT_AMBULATORY_CARE_PROVIDER_SITE_OTHER): Payer: Medicaid Other | Admitting: Advanced Practice Midwife

## 2020-06-23 ENCOUNTER — Other Ambulatory Visit: Payer: Self-pay

## 2020-06-23 VITALS — BP 134/71 | HR 103 | Wt 189.4 lb

## 2020-06-23 DIAGNOSIS — F329 Major depressive disorder, single episode, unspecified: Secondary | ICD-10-CM

## 2020-06-23 DIAGNOSIS — O99340 Other mental disorders complicating pregnancy, unspecified trimester: Secondary | ICD-10-CM

## 2020-06-23 DIAGNOSIS — Z3480 Encounter for supervision of other normal pregnancy, unspecified trimester: Secondary | ICD-10-CM

## 2020-06-23 DIAGNOSIS — Z3A32 32 weeks gestation of pregnancy: Secondary | ICD-10-CM

## 2020-06-23 DIAGNOSIS — N898 Other specified noninflammatory disorders of vagina: Secondary | ICD-10-CM

## 2020-06-23 NOTE — Progress Notes (Signed)
Patient presents for ROB. Patient has no concerns today. 

## 2020-06-23 NOTE — Progress Notes (Signed)
   PRENATAL VISIT NOTE  Subjective:  Terry [name] is a 24 y.o. G3P1011 at [redacted]w[redacted]d being seen today for ongoing prenatal care.  She is currently monitored for the following issues for this low-risk pregnancy and has Supervision of other normal pregnancy, antepartum; Depression affecting pregnancy; History of prior pregnancy with short cervix, currently pregnant; and Round ligament pain on their problem list.  Patient reports was seen in MAU for contractions but these are improved, passed mucus, ? mucus plug yesterday.  Contractions: Irritability. Vag. Bleeding: None.  Movement: Present. Denies leaking of fluid.   The following portions of the patient's history were reviewed and updated as appropriate: allergies, current medications, past family history, past medical history, past social history, past surgical history and problem list.   Objective:   Vitals:   06/23/20 1352  BP: 134/71  Pulse: (!) 103  Weight: 189 lb 6.4 oz (85.9 kg)    Fetal Status: Fetal Heart Rate (bpm): 130 Fundal Height: 33 cm Movement: Present  Presentation: Vertex  General:  Alert, oriented and cooperative. Patient is in no acute distress.  Skin: Skin is warm and dry. No rash noted.   Cardiovascular: Normal heart rate noted  Respiratory: Normal respiratory effort, no problems with respiration noted  Abdomen: Soft, gravid, appropriate for gestational age.  Pain/Pressure: Present     Pelvic: Cervical exam performed in the presence of a chaperone Dilation: 1 Effacement (%): 0 Station: -3  Extremities: Normal range of motion.  Edema: None  Mental Status: Normal mood and affect. Normal behavior. Normal judgment and thought content.   Assessment and Plan:  Pregnancy: G3P1011 at [redacted]w[redacted]d 1. Supervision of other normal pregnancy, antepartum --Anticipatory guidance about next visits/weeks of pregnancy given. --Pt has waterbirth class tomorrow, she is to bring certificate or send in via MyChart --Next visit in 4 weeks in  office for GBS  2. Depression affecting pregnancy --Doing well, stable at this time.   3. Vaginal discharge --No itching/burning/odor.  Pt has picture of tan/brown tinged mucus discharge from yesterday.  Consistent with mucus/mucus plug, may be related to recent cervical exam in MAU.  --Cervix 1/thick/high, vertex today on exam, and no regular contractions reported so no evidence of preterm labor today. --PTL precautions/reasons to go to MAU reviewed  4. [redacted] weeks gestation of pregnancy   Preterm labor symptoms and general obstetric precautions including but not limited to vaginal bleeding, contractions, leaking of fluid and fetal movement were reviewed in detail with the patient. Please refer to After Visit Summary for other counseling recommendations.   Return in about 4 weeks (around 07/21/2020).  No future appointments.  Sharen Counter, CNM

## 2020-06-23 NOTE — Patient Instructions (Signed)
Reasons to return to MAU at [facility]:  Since you are preterm, return to MAU if:  1.  Contractions are 10 minutes apart or less and they becoming more uncomfortable or painful over time 2.  You have a large gush of fluid, or a trickle of fluid that will not stop and you have to wear a pad 3.  You have bleeding that is bright red, heavier than spotting--like menstrual bleeding (spotting can be normal in early labor or after a check of your cervix) 4.  You do not feel the baby moving like he/she normally does  

## 2020-07-15 ENCOUNTER — Inpatient Hospital Stay (HOSPITAL_COMMUNITY)
Admission: AD | Admit: 2020-07-15 | Discharge: 2020-07-25 | DRG: 831 | Disposition: A | Discharge status: Home / Self Care | Payer: Medicaid Other | Attending: Obstetrics & Gynecology | Admitting: Obstetrics & Gynecology

## 2020-07-15 ENCOUNTER — Encounter (HOSPITAL_COMMUNITY): Payer: Self-pay | Admitting: Family Medicine

## 2020-07-15 ENCOUNTER — Other Ambulatory Visit: Payer: Self-pay

## 2020-07-15 ENCOUNTER — Inpatient Hospital Stay (HOSPITAL_COMMUNITY): Payer: Medicaid Other

## 2020-07-15 DIAGNOSIS — O98513 Other viral diseases complicating pregnancy, third trimester: Principal | ICD-10-CM | POA: Diagnosis present

## 2020-07-15 DIAGNOSIS — U071 COVID-19: Secondary | ICD-10-CM | POA: Diagnosis present

## 2020-07-15 DIAGNOSIS — Z3A35 35 weeks gestation of pregnancy: Secondary | ICD-10-CM

## 2020-07-15 DIAGNOSIS — J1282 Pneumonia due to coronavirus disease 2019: Secondary | ICD-10-CM | POA: Diagnosis present

## 2020-07-15 DIAGNOSIS — O368330 Maternal care for abnormalities of the fetal heart rate or rhythm, third trimester, not applicable or unspecified: Secondary | ICD-10-CM | POA: Diagnosis present

## 2020-07-15 DIAGNOSIS — J9601 Acute respiratory failure with hypoxia: Secondary | ICD-10-CM | POA: Diagnosis present

## 2020-07-15 DIAGNOSIS — Z3A36 36 weeks gestation of pregnancy: Secondary | ICD-10-CM

## 2020-07-15 DIAGNOSIS — O99513 Diseases of the respiratory system complicating pregnancy, third trimester: Secondary | ICD-10-CM | POA: Diagnosis present

## 2020-07-15 MED ORDER — LACTATED RINGERS IV SOLN
INTRAVENOUS | Status: DC
  Administered 2020-07-15: 125 mL/h via INTRAVENOUS

## 2020-07-15 NOTE — MAU Note (Signed)
Pt here for COVID symptoms that started Thursday. Pt reports nausea/vomiting/diarrhea, SOB, congestion, running nose, chills, feeling achy. Pt denies any known COVID exposures. Pt has not checked temperature at home, but denies feeling feverish. Pt reports some mild contractions irregularly. No LOF or vaginal bleeding. Good fetal movement.

## 2020-07-15 NOTE — MAU Provider Note (Addendum)
Chief Complaint:  Shortness of Breath, Cough, Nasal Congestion, Emesis, and Diarrhea   First Provider Initiated Contact with Patient 07/15/20 2257     HPI: Terry [name] is a 24 y.o. G3P1011 at 35w4dwho presents to maternity admissions reporting fever, shortness of breath, N/V, Congestion, whole body aches, intermittent contractions since Thursday.  Has not been tested or seen any medical professional .  States it got worse today.  . She reports good fetal movement, denies LOF, vaginal bleeding, vaginal itching/burning, urinary symptoms, h/a, dizziness, n/v, diarrhea, constipation or fever/chills.  She denies headache, visual changes or RUQ abdominal pain.  RN Note: Pt here for COVID symptoms that started Thursday. Pt reports nausea/vomiting/diarrhea, SOB, congestion, running nose, chills, feeling achy. Pt denies any known COVID exposures. Pt has not checked temperature at home, but denies feeling feverish. Pt reports some mild contractions irregularly. No LOF or vaginal bleeding. Good fetal movement.   Shortness of Breath This is a new problem. The current episode started in the past 7 days. The problem occurs constantly. The problem has been unchanged. Associated symptoms include abdominal pain, a fever, rhinorrhea, sputum production and vomiting. Pertinent negatives include no chest pain, leg swelling, sore throat or wheezing. Nothing aggravates the symptoms. She has tried nothing for the symptoms.  Cough This is a new problem. The current episode started in the past 7 days. The problem has been unchanged. Associated symptoms include a fever, myalgias, nasal congestion, rhinorrhea and shortness of breath. Pertinent negatives include no chest pain, sore throat or wheezing. Nothing aggravates the symptoms.  Emesis  This is a new problem. The current episode started in the past 7 days. The problem has been waxing and waning. The maximum temperature recorded prior to her arrival was 102 - 102.9 F.  Associated symptoms include abdominal pain, coughing, diarrhea, a fever and myalgias. Pertinent negatives include no chest pain.  Diarrhea  This is a new problem. The current episode started in the past 7 days. The problem has been unchanged. Associated symptoms include abdominal pain, coughing, a fever, myalgias and vomiting. She has tried nothing for the symptoms.     Past Medical History: Past Medical History:  Diagnosis Date  . Chlamydia   . Depression   . Gonorrhea     Past obstetric history: OB History  Gravida Para Term Preterm AB Living  3 1 1   1 1  SAB TAB Ectopic Multiple Live Births  1 0   0 1    # Outcome Date GA Lbr Len/2nd Weight Sex Delivery Anes PTL Lv  3 Current           2 SAB 07/2019          1 Term 12/28/16 [redacted]w[redacted]d 07:57 / 02:31 3830 g F Vag-Spont EPI  LIV    Past Surgical History: Past Surgical History:  Procedure Laterality Date  . NO PAST SURGERIES      Family History: Family History  Problem Relation Age of Onset  . Cancer Maternal Grandmother        pancreatic  . Diabetes Maternal Uncle   . Diabetes Mother   . Hypertension Mother   . Diabetes Father     Social History: Social History   Tobacco Use  . Smoking status: Never Smoker  . Smokeless tobacco: Never Used  Vaping Use  . Vaping Use: Never used  Substance Use Topics  . Alcohol use: No  . Drug use: No    Allergies: No Known Allergies  Meds:    Medications Prior to Admission  Medication Sig Dispense Refill Last Dose  . Blood Pressure Monitoring (BLOOD PRESSURE CUFF) MISC 1 Device by Does not apply route once a week. 1 each 0   . Blood Pressure Monitoring (BLOOD PRESSURE KIT) DEVI 1 kit by Does not apply route once a week. Check Blood Pressure regularly and record readings into the [employer] App.  Large Cuff.  DX O90.0 1 each 0   . Elastic Bandages & Supports MISC 1 Device by Does not apply route daily as needed. 1 Device 0   . ferrous sulfate 325 (65 FE) MG tablet Take 1  tablet (325 mg total) by mouth 2 (two) times daily with a meal. 60 tablet 5   . Prenatal Vit-Fe Fumarate-FA (PRENATAL COMPLETE) 14-0.4 MG TABS Take 1 tablet by mouth daily. 60 tablet 0   . Prenatal Vit-Fe Fumarate-FA (PRENATAL VITAMINS PO) Take by mouth.      . sertraline (ZOLOFT) 25 MG tablet Take 1 tablet (25 mg total) by mouth at bedtime. 60 tablet 2     I have reviewed patient's Past Medical Hx, Surgical Hx, Family Hx, Social Hx, medications and allergies.   ROS:  Review of Systems  Constitutional: Positive for fever.  HENT: Positive for rhinorrhea. Negative for sore throat.   Respiratory: Positive for cough, sputum production and shortness of breath. Negative for wheezing.   Cardiovascular: Negative for chest pain and leg swelling.  Gastrointestinal: Positive for abdominal pain, diarrhea and vomiting.  Musculoskeletal: Positive for myalgias.   Other systems negative  Physical Exam   Patient Vitals for the past 24 hrs:  BP Temp Temp src Pulse Resp SpO2  07/15/20 2248 (!) 101/41 -- -- (!) 106 -- --  07/15/20 2241 (!) 84/55 (!) 101.8 F (38.8 C) Oral (!) 118 (!) 36 90 %   On 2 liters of oxygen: Vitals:   07/16/20 0030 07/16/20 0039 07/16/20 0056 07/16/20 0114  BP: (!) 95/47     Pulse: (!) 113     Resp:   (!) 34   Temp:   (!) 100.6 F (38.1 C)   TempSrc:   Oral   SpO2:  98% 95% 99%    Constitutional: Ill-appearing, female in no acute distress.  Cardiovascular: mildly tachycardic, normal rhythm Respiratory: normal effort, expiratory wheezes upper lobes, diminished lower lobes GI: Abd soft, non-tender, gravid appropriate for gestational age.   No rebound or guarding. MS: Extremities nontender, no edema, normal ROM Neurologic: Alert and oriented x 4.  GU: Neg CVAT.  PELVIC EXAM: Dilation: 1.5 Effacement (%): 50 Station: -2 Presentation: Vertex Exam by:: Crewe Heathman, CNM   FHT:  Baseline 150 , moderate variability, accelerations present, no  decelerations Contractions:  Irregular     Labs: Results for orders placed or performed during the hospital encounter of 07/15/20 (from the past 24 hour(s))  CBC with Differential/Platelet     Status: Abnormal   Collection Time: 07/15/20 11:20 PM  Result Value Ref Range   WBC 9.9 4.0 - 10.5 K/uL   RBC 3.21 (L) 3.87 - 5.11 MIL/uL   Hemoglobin 10.4 (L) 12.0 - 15.0 g/dL   HCT 30.9 (L) 36 - 46 %   MCV 96.3 80.0 - 100.0 fL   MCH 32.4 26.0 - 34.0 pg   MCHC 33.7 30.0 - 36.0 g/dL   RDW 15.9 (H) 11.5 - 15.5 %   Platelets 254 150 - 400 K/uL   nRBC 0.5 (H) 0.0 - 0.2 %   Neutrophils Relative % 87 %     Neutro Abs 8.6 (H) 1.7 - 7.7 K/uL   Lymphocytes Relative 7 %   Lymphs Abs 0.7 0.7 - 4.0 K/uL   Monocytes Relative 3 %   Monocytes Absolute 0.3 0 - 1 K/uL   Eosinophils Relative 0 %   Eosinophils Absolute 0.0 0 - 0 K/uL   Basophils Relative 0 %   Basophils Absolute 0.0 0 - 0 K/uL   Immature Granulocytes 3 %   Abs Immature Granulocytes 0.27 (H) 0.00 - 0.07 K/uL   Polychromasia PRESENT   Comprehensive metabolic panel     Status: Abnormal   Collection Time: 07/15/20 11:20 PM  Result Value Ref Range   Sodium 132 (L) 135 - 145 mmol/L   Potassium 3.2 (L) 3.5 - 5.1 mmol/L   Chloride 100 98 - 111 mmol/L   CO2 21 (L) 22 - 32 mmol/L   Glucose, Bld 99 70 - 99 mg/dL   BUN 5 (L) 6 - 20 mg/dL   Creatinine, Ser 0.84 0.44 - 1.00 mg/dL   Calcium 7.9 (L) 8.9 - 10.3 mg/dL   Total Protein 5.7 (L) 6.5 - 8.1 g/dL   Albumin 2.4 (L) 3.5 - 5.0 g/dL   AST 23 15 - 41 U/L   ALT 13 0 - 44 U/L   Alkaline Phosphatase 89 38 - 126 U/L   Total Bilirubin 0.9 0.3 - 1.2 mg/dL   GFR calc non Af Amer >60 >60 mL/min   GFR calc Af Amer >60 >60 mL/min   Anion gap 11 5 - 15  Brain natriuretic peptide     Status: None   Collection Time: 07/15/20 11:20 PM  Result Value Ref Range   B Natriuretic Peptide 65.8 0.0 - 100.0 pg/mL  Troponin I (High Sensitivity)     Status: None   Collection Time: 07/15/20 11:20 PM  Result  Value Ref Range   Troponin I (High Sensitivity) 6 <18 ng/L  C-reactive protein     Status: Abnormal   Collection Time: 07/15/20 11:20 PM  Result Value Ref Range   CRP 8.4 (H) <1.0 mg/dL  D-dimer, quantitative (not at [facility])     Status: Abnormal   Collection Time: 07/15/20 11:20 PM  Result Value Ref Range   D-Dimer, Quant 3.12 (H) 0.00 - 0.50 ug/mL-FEU  Fibrinogen     Status: None   Collection Time: 07/15/20 11:20 PM  Result Value Ref Range   Fibrinogen 412 210 - 475 mg/dL  Ferritin     Status: None   Collection Time: 07/15/20 11:20 PM  Result Value Ref Range   Ferritin 29 11 - 307 ng/mL  SARS Coronavirus 2 by RT PCR (hospital order, performed in [place] hospital lab) Nasopharyngeal Nasopharyngeal Swab     Status: Abnormal   Collection Time: 07/15/20 11:26 PM   Specimen: Nasopharyngeal Swab  Result Value Ref Range   SARS Coronavirus 2 POSITIVE (A) NEGATIVE    B/Positive/-- (03/23 0946)  Imaging:  DG Chest Portable 1 View  Result Date: 07/15/2020 CLINICAL DATA:  Short of breath, congestion, rhinorrhea, pregnant EXAM: PORTABLE CHEST 1 VIEW COMPARISON:  12/11/2019 FINDINGS: Single frontal view of the chest demonstrates multifocal bilateral airspace disease consistent with pneumonia. No effusion or pneumothorax. Cardiac silhouette is stable. IMPRESSION: 1. Multifocal bilateral airspace disease, favor pneumonia over edema. Pattern is consistent with COVID 19. Electronically Signed   By: Michael  Brown M.D.   On: 07/15/2020 23:42   MAU Course/MDM: I have ordered labs and reviewed results. D-Dimer and CRP are elevated.    K+ is mildly low. Covid test is positive.   NST reviewed and is reassuring with irregular mild contractions, GBS ordered.  Cervix slightly elevated.  Consult Dr Pratt and TRH with presentation, exam findings and test results.   Will admit to [facility]  Lovenox for prophylaxis Will await TRH consult re: Remdesivir vs MCA  Assessment: Single IUP at [redacted]w[redacted]d Bilateral  pneumonia due to Covid Uterine irritability  Plan: Admit Routine orders per order set TRH/OB to comanage. MD to follow   Craige Patel CNM, MSN Certified Nurse-Midwife 07/15/2020 10:58 PM  

## 2020-07-16 ENCOUNTER — Inpatient Hospital Stay (HOSPITAL_COMMUNITY): Payer: Medicaid Other

## 2020-07-16 DIAGNOSIS — O368330 Maternal care for abnormalities of the fetal heart rate or rhythm, third trimester, not applicable or unspecified: Secondary | ICD-10-CM | POA: Diagnosis present

## 2020-07-16 DIAGNOSIS — U071 COVID-19: Secondary | ICD-10-CM

## 2020-07-16 DIAGNOSIS — O99513 Diseases of the respiratory system complicating pregnancy, third trimester: Secondary | ICD-10-CM | POA: Diagnosis present

## 2020-07-16 DIAGNOSIS — J9601 Acute respiratory failure with hypoxia: Secondary | ICD-10-CM | POA: Diagnosis present

## 2020-07-16 DIAGNOSIS — J1282 Pneumonia due to coronavirus disease 2019: Secondary | ICD-10-CM | POA: Diagnosis present

## 2020-07-16 DIAGNOSIS — R0602 Shortness of breath: Secondary | ICD-10-CM | POA: Diagnosis present

## 2020-07-16 DIAGNOSIS — Z3A36 36 weeks gestation of pregnancy: Secondary | ICD-10-CM | POA: Diagnosis not present

## 2020-07-16 DIAGNOSIS — O98513 Other viral diseases complicating pregnancy, third trimester: Secondary | ICD-10-CM | POA: Diagnosis present

## 2020-07-16 DIAGNOSIS — Z3A35 35 weeks gestation of pregnancy: Secondary | ICD-10-CM | POA: Diagnosis not present

## 2020-07-16 LAB — COMPREHENSIVE METABOLIC PANEL
ALT: 13 U/L (ref 0–44)
AST: 23 U/L (ref 15–41)
Albumin: 2.4 g/dL — ABNORMAL LOW (ref 3.5–5.0)
Alkaline Phosphatase: 89 U/L (ref 38–126)
Anion gap: 11 (ref 5–15)
BUN: 5 mg/dL — ABNORMAL LOW (ref 6–20)
CO2: 21 mmol/L — ABNORMAL LOW (ref 22–32)
Calcium: 7.9 mg/dL — ABNORMAL LOW (ref 8.9–10.3)
Chloride: 100 mmol/L (ref 98–111)
Creatinine, Ser: 0.84 mg/dL (ref 0.44–1.00)
GFR calc Af Amer: >60 mL/min (ref >60)
GFR calc non Af Amer: >60 mL/min (ref >60)
Glucose, Bld: 99 mg/dL (ref 70–99)
Potassium: 3.2 mmol/L — ABNORMAL LOW (ref 3.5–5.1)
Sodium: 132 mmol/L — ABNORMAL LOW (ref 135–145)
Total Bilirubin: 0.9 mg/dL (ref 0.3–1.2)
Total Protein: 5.7 g/dL — ABNORMAL LOW (ref 6.5–8.1)

## 2020-07-16 LAB — CBC WITH DIFFERENTIAL/PLATELET
Abs Immature Granulocytes: 0.27 10*3/uL — ABNORMAL HIGH (ref 0.00–0.07)
Basophils Absolute: 0 10*3/uL (ref 0.0–0.1)
Basophils Relative: 0 %
Eosinophils Absolute: 0 10*3/uL (ref 0.0–0.5)
Eosinophils Relative: 0 %
HCT: 30.9 % — ABNORMAL LOW (ref 36.0–46.0)
Hemoglobin: 10.4 g/dL — ABNORMAL LOW (ref 12.0–15.0)
Immature Granulocytes: 3 %
Lymphocytes Relative: 7 %
Lymphs Abs: 0.7 10*3/uL (ref 0.7–4.0)
MCH: 32.4 pg (ref 26.0–34.0)
MCHC: 33.7 g/dL (ref 30.0–36.0)
MCV: 96.3 fL (ref 80.0–100.0)
Monocytes Absolute: 0.3 10*3/uL (ref 0.1–1.0)
Monocytes Relative: 3 %
Neutro Abs: 8.6 10*3/uL — ABNORMAL HIGH (ref 1.7–7.7)
Neutrophils Relative %: 87 %
Platelets: 254 10*3/uL (ref 150–400)
RBC: 3.21 MIL/uL — ABNORMAL LOW (ref 3.87–5.11)
RDW: 15.9 % — ABNORMAL HIGH (ref 11.5–15.5)
WBC: 9.9 10*3/uL (ref 4.0–10.5)
nRBC: 0.5 % — ABNORMAL HIGH (ref 0.0–0.2)

## 2020-07-16 LAB — FIBRINOGEN: Fibrinogen: 412 mg/dL (ref 210–475)

## 2020-07-16 LAB — FERRITIN: Ferritin: 29 ng/mL (ref 11–307)

## 2020-07-16 LAB — SARS CORONAVIRUS 2 BY RT PCR (HOSPITAL ORDER, PERFORMED IN ~~LOC~~ HOSPITAL LAB): SARS Coronavirus 2: POSITIVE — AB

## 2020-07-16 LAB — C-REACTIVE PROTEIN: CRP: 8.4 mg/dL — ABNORMAL HIGH (ref <1.0)

## 2020-07-16 LAB — TROPONIN I (HIGH SENSITIVITY): Troponin I (High Sensitivity): 6 ng/L (ref <18)

## 2020-07-16 LAB — BRAIN NATRIURETIC PEPTIDE: B Natriuretic Peptide: 65.8 pg/mL (ref 0.0–100.0)

## 2020-07-16 LAB — PROCALCITONIN: Procalcitonin: <0.1 ng/mL

## 2020-07-16 LAB — D-DIMER, QUANTITATIVE: D-Dimer, Quant: 3.12 ug/mL-FEU — ABNORMAL HIGH (ref 0.00–0.50)

## 2020-07-16 MED ORDER — SODIUM CHLORIDE 0.9 % IV SOLN
100.0000 mg | INTRAVENOUS | Status: AC
  Administered 2020-07-17 – 2020-07-20 (×4): 100 mg via INTRAVENOUS
  Filled 2020-07-16 (×4): qty 20

## 2020-07-16 MED ORDER — DOCUSATE SODIUM 100 MG PO CAPS
100.0000 mg | ORAL_CAPSULE | Freq: Two times a day (BID) | ORAL | Status: DC
  Administered 2020-07-16 – 2020-07-23 (×5): 100 mg via ORAL
  Filled 2020-07-16 (×15): qty 1

## 2020-07-16 MED ORDER — SODIUM CHLORIDE 0.9 % IV SOLN
200.0000 mg | Freq: Once | INTRAVENOUS | Status: AC
  Administered 2020-07-16: 200 mg via INTRAVENOUS
  Filled 2020-07-16: qty 200

## 2020-07-16 MED ORDER — ASCORBIC ACID 500 MG PO TABS
500.0000 mg | ORAL_TABLET | Freq: Every day | ORAL | Status: DC
  Administered 2020-07-16 – 2020-07-25 (×10): 500 mg via ORAL
  Filled 2020-07-16 (×10): qty 1

## 2020-07-16 MED ORDER — ACETAMINOPHEN 325 MG PO TABS
650.0000 mg | ORAL_TABLET | Freq: Four times a day (QID) | ORAL | Status: DC | PRN
  Administered 2020-07-16 – 2020-07-18 (×2): 650 mg via ORAL
  Filled 2020-07-16 (×2): qty 2

## 2020-07-16 MED ORDER — ASPIRIN EC 81 MG PO TBEC
81.0000 mg | DELAYED_RELEASE_TABLET | Freq: Every day | ORAL | Status: DC
  Administered 2020-07-16 – 2020-07-25 (×10): 81 mg via ORAL
  Filled 2020-07-16 (×10): qty 1

## 2020-07-16 MED ORDER — PROMETHAZINE HCL 25 MG PO TABS: 12.5000 mg | ORAL_TABLET | Freq: Four times a day (QID) | ORAL | Status: DC | PRN

## 2020-07-16 MED ORDER — ZINC SULFATE 220 (50 ZN) MG PO CAPS
220.0000 mg | ORAL_CAPSULE | Freq: Every day | ORAL | Status: DC
  Administered 2020-07-16 – 2020-07-25 (×10): 220 mg via ORAL
  Filled 2020-07-16 (×10): qty 1

## 2020-07-16 MED ORDER — IOHEXOL 350 MG/ML SOLN
75.0000 mL | Freq: Once | INTRAVENOUS | Status: AC | PRN
  Administered 2020-07-16: 75 mL via INTRAVENOUS

## 2020-07-16 MED ORDER — GUAIFENESIN ER 600 MG PO TB12
600.0000 mg | ORAL_TABLET | Freq: Two times a day (BID) | ORAL | Status: DC
  Administered 2020-07-16 – 2020-07-25 (×20): 600 mg via ORAL
  Filled 2020-07-16 (×21): qty 1

## 2020-07-16 MED ORDER — PREDNISONE 50 MG PO TABS
50.0000 mg | ORAL_TABLET | Freq: Every day | ORAL | Status: DC
  Administered 2020-07-19 – 2020-07-20 (×2): 50 mg via ORAL
  Filled 2020-07-16 (×2): qty 1

## 2020-07-16 MED ORDER — POTASSIUM CHLORIDE CRYS ER 10 MEQ PO TBCR
10.0000 meq | EXTENDED_RELEASE_TABLET | Freq: Two times a day (BID) | ORAL | Status: AC
  Administered 2020-07-16 – 2020-07-17 (×4): 10 meq via ORAL
  Filled 2020-07-16 (×5): qty 1

## 2020-07-16 MED ORDER — COMPLETENATE 29-1 MG PO CHEW: 1.0000 | CHEWABLE_TABLET | Freq: Every day | ORAL | Status: DC

## 2020-07-16 MED ORDER — BENZONATATE 100 MG PO CAPS
100.0000 mg | ORAL_CAPSULE | Freq: Three times a day (TID) | ORAL | Status: DC | PRN
  Administered 2020-07-16 – 2020-07-24 (×6): 100 mg via ORAL
  Filled 2020-07-16 (×8): qty 1

## 2020-07-16 MED ORDER — SERTRALINE HCL 50 MG PO TABS
25.0000 mg | ORAL_TABLET | Freq: Every day | ORAL | Status: DC
  Administered 2020-07-16 – 2020-07-24 (×9): 25 mg via ORAL
  Filled 2020-07-16 (×9): qty 1

## 2020-07-16 MED ORDER — METHYLPREDNISOLONE SODIUM SUCC 125 MG IJ SOLR
40.0000 mg | Freq: Two times a day (BID) | INTRAMUSCULAR | Status: AC
  Administered 2020-07-16 – 2020-07-18 (×6): 40 mg via INTRAVENOUS
  Filled 2020-07-16 (×6): qty 2

## 2020-07-16 MED ORDER — LACTATED RINGERS IV SOLN: INTRAVENOUS | Status: AC

## 2020-07-16 MED ORDER — ENOXAPARIN SODIUM 40 MG/0.4ML ~~LOC~~ SOLN
40.0000 mg | SUBCUTANEOUS | Status: DC
  Administered 2020-07-16 – 2020-07-25 (×9): 40 mg via SUBCUTANEOUS
  Filled 2020-07-16 (×11): qty 0.4

## 2020-07-16 MED ORDER — LACTATED RINGERS IV BOLUS
500.0000 mL | Freq: Once | INTRAVENOUS | Status: AC
  Administered 2020-07-16: 500 mL via INTRAVENOUS

## 2020-07-16 MED ORDER — PRENATAL MULTIVITAMIN CH
1.0000 | ORAL_TABLET | Freq: Every day | ORAL | Status: DC
  Administered 2020-07-16 – 2020-07-25 (×10): 1 via ORAL
  Filled 2020-07-16 (×10): qty 1

## 2020-07-16 NOTE — Progress Notes (Signed)
[facility] CONSULTPROGRESS NOTE    Progress Note  Terry [name]  ZOX:[id number] DOB: 07/04/1996 DOA: 07/15/2020 PCP: [facility]     Brief Narrative:   Terry ClinesKiaheshia [name] is an 24 y.o. female 35 weeks and 4 days pregnant who came in with shortness of breath nausea vomiting and body aches was found to be febrile in the ED satting 90% on room air tachypneic and tachycardic with a blood pressure of 84/55.  Assessment/Plan:   Active Problems:   Pneumonia due to COVID-19 virus [name] is currently on 3 L of oxygen nasal cannula to keep saturations at 100%, will try to wean her down to 2 L. Her respiration has improved, [name] is afebrile and her blood pressure is stabilized. [name] was started on IV remdesivir and steroids.  Continue IV remdesivir for at least 5 and steroids until [name] has been weaned off oxygen. Can keep her saturations greater than 94%. Continue to follow inflammatory markers daily. Her blood pressure seems to be stable [name] is not hypotensive pregnant females tend to run a lower blood pressure due to the added circuit. Continue treatment, incentive spirometry frequently has weeks 2 or 3 times at a lower. Try to keep the patient on her side, and try to get her out of bed to chair and use the incentive spirometry as much as possible.     DVT prophylaxis: lovenox Family Communication:none Status is: Inpatient  Remains inpatient appropriate because:Hemodynamically unstable   Dispo: The patient is from: Home              Anticipated d/c is to: Home              Anticipated d/c date is: > 3 days              Patient currently is not medically stable to d/c.        Code Status:     Code Status Orders  (From admission, onward)         Start     Ordered   07/16/20 0233  Full code  Continuous        07/16/20 0245        Code Status History    Date Active Date Inactive Code Status Order ID Comments User Context   12/28/2016 0647  12/30/2016 1751 Full Code [id number]  Donette LarryBhambri, Melanie, CNM Inpatient   12/27/2016 2013 12/28/2016 0647 Full Code [id number]  Marylene LandKooistra, Kathryn Lorraine, CNM Inpatient   Advance Care Planning Activity        IV Access:    Peripheral IV   Procedures and diagnostic studies:   CT ANGIO CHEST PE W OR WO CONTRAST  Result Date: 07/16/2020 CLINICAL DATA:  COVID symptoms. High probability for pulmonary embolism EXAM: CT ANGIOGRAPHY CHEST WITH CONTRAST TECHNIQUE: Multidetector CT imaging of the chest was performed using the standard protocol during bolus administration of intravenous contrast. Multiplanar CT image reconstructions and MIPs were obtained to evaluate the vascular anatomy. CONTRAST:  75mL OMNIPAQUE IOHEXOL 350 MG/ML SOLN COMPARISON:  None. FINDINGS: Cardiovascular: Normal heart size. No pericardial effusion. No pulmonary artery filling defects. Normal aorta. Mediastinum/Nodes: Negative for adenopathy or mass Lungs/Pleura: Ground-glass and consolidative opacities throughout the bilateral lungs. There is known COVID-19 infection. No edema or effusion. Upper Abdomen: Negative Musculoskeletal: Negative Review of the MIP images confirms the above findings. IMPRESSION: 1. COVID-19 with multifocal pneumonia. 2. Negative for pulmonary embolism. Electronically Signed   By: Marnee SpringJonathon  Watts M.D.   On:  07/16/2020 07:38   DG Chest Portable 1 View  Result Date: 07/15/2020 CLINICAL DATA:  Short of breath, congestion, rhinorrhea, pregnant EXAM: PORTABLE CHEST 1 VIEW COMPARISON:  12/11/2019 FINDINGS: Single frontal view of the chest demonstrates multifocal bilateral airspace disease consistent with pneumonia. No effusion or pneumothorax. Cardiac silhouette is stable. IMPRESSION: 1. Multifocal bilateral airspace disease, favor pneumonia over edema. Pattern is consistent with COVID 19. Electronically Signed   By: Sharlet Salina M.D.   On: 07/15/2020 23:42   VAS [place] LOWER EXTREMITY VENOUS (DVT)  Result Date:  07/16/2020  Lower Venous DVTStudy Other Indications: COVID, pregnant, d-dimer 3, bilateral leg tenderness and                    pleuritic pain. Comparison Study: no prior Performing Technologist: Blanch Media RVS  Examination Guidelines: A complete evaluation includes B-mode imaging, spectral Doppler, color Doppler, and power Doppler as needed of all accessible portions of each vessel. Bilateral testing is considered an integral part of a complete examination. Limited examinations for reoccurring indications may be performed as noted. The reflux portion of the exam is performed with the patient in reverse Trendelenburg.  +---------+---------------+---------+-----------+----------+--------------+ RIGHT    CompressibilityPhasicitySpontaneityPropertiesThrombus Aging +---------+---------------+---------+-----------+----------+--------------+ CFV      Full           Yes      Yes                                 +---------+---------------+---------+-----------+----------+--------------+ SFJ      Full                                                        +---------+---------------+---------+-----------+----------+--------------+ FV Prox  Full                                                        +---------+---------------+---------+-----------+----------+--------------+ FV Mid   Full                                                        +---------+---------------+---------+-----------+----------+--------------+ FV DistalFull                                                        +---------+---------------+---------+-----------+----------+--------------+ PFV      Full                                                        +---------+---------------+---------+-----------+----------+--------------+ POP      Full           Yes      Yes                                 +---------+---------------+---------+-----------+----------+--------------+  PTV      Full                                                         +---------+---------------+---------+-----------+----------+--------------+ PERO     Full                                                        +---------+---------------+---------+-----------+----------+--------------+   +---------+---------------+---------+-----------+----------+--------------+ LEFT     CompressibilityPhasicitySpontaneityPropertiesThrombus Aging +---------+---------------+---------+-----------+----------+--------------+ CFV      Full           Yes      Yes                                 +---------+---------------+---------+-----------+----------+--------------+ SFJ      Full                                                        +---------+---------------+---------+-----------+----------+--------------+ FV Prox  Full                                                        +---------+---------------+---------+-----------+----------+--------------+ FV Mid   Full                                                        +---------+---------------+---------+-----------+----------+--------------+ FV DistalFull                                                        +---------+---------------+---------+-----------+----------+--------------+ PFV      Full                                                        +---------+---------------+---------+-----------+----------+--------------+ POP      Full           Yes      Yes                                 +---------+---------------+---------+-----------+----------+--------------+ PTV      Full                                                        +---------+---------------+---------+-----------+----------+--------------+  PERO     Full                                                        +---------+---------------+---------+-----------+----------+--------------+     Summary: BILATERAL: - No evidence of deep vein thrombosis seen in the  lower extremities, bilaterally. - No evidence of superficial venous thrombosis in the lower extremities, bilaterally. -   *See table(s) above for measurements and observations.    Preliminary      Medical Consultants:    None.  Anti-Infectives:   remdesivir  Subjective:    Terry [name] relates her breathing is slightly better than yesterday.  Objective:    Vitals:   07/16/20 1155 07/16/20 1200 07/16/20 1217 07/16/20 1230  BP:   (!) 106/59   Pulse:   96   Resp:   (!) 25   Temp:   98 F (36.7 C)   TempSrc:   Oral   SpO2: 96% 94% 95% 100%  Weight:      Height:       SpO2: 100 % O2 Flow Rate (L/min): 3 L/min   Intake/Output Summary (Last 24 hours) at 07/16/2020 1310 Last data filed at 07/16/2020 1217 Gross per 24 hour  Intake --  Output 2 ml  Net -2 ml   Filed Weights   07/16/20 0545  Weight: 88.9 kg    Exam: General exam: In no acute distress. Respiratory system: Good air movement and crackles diffusely bilateral Cardiovascular system: S1 & S2 heard, RRR.  Gastrointestinal system: Abdomen is nondistended, soft and nontender.  Extremities: No pedal edema. Skin: No rashes, lesions or ulcers Psychiatry: Judgement and insight appear normal. Mood & affect appropriate.    Data Reviewed:    Labs: Basic Metabolic Panel: Recent Labs  Lab 07/15/20 2320  NA 132*  K 3.2*  CL 100  CO2 21*  GLUCOSE 99  BUN 5*  CREATININE 0.84  CALCIUM 7.9*   GFR Estimated Creatinine Clearance: 122.9 mL/min (by C-G formula based on SCr of 0.84 mg/dL). Liver Function Tests: Recent Labs  Lab 07/15/20 2320  AST 23  ALT 13  ALKPHOS 89  BILITOT 0.9  PROT 5.7*  ALBUMIN 2.4*   No results for input(s): LIPASE, AMYLASE in the last 168 hours. No results for input(s): AMMONIA in the last 168 hours. Coagulation profile No results for input(s): INR, PROTIME in the last 168 hours. COVID-19 Labs  Recent Labs    07/15/20 2320  DDIMER 3.12*  FERRITIN 29  CRP 8.4*     Lab Results  Component Value Date   SARSCOV2NAA POSITIVE (A) 07/15/2020    CBC: Recent Labs  Lab 07/15/20 2320  WBC 9.9  NEUTROABS 8.6*  HGB 10.4*  HCT 30.9*  MCV 96.3  PLT 254   Cardiac Enzymes: No results for input(s): CKTOTAL, CKMB, CKMBINDEX, TROPONINI in the last 168 hours. BNP (last 3 results) No results for input(s): PROBNP in the last 8760 hours. CBG: No results for input(s): GLUCAP in the last 168 hours. D-Dimer: Recent Labs    07/15/20 2320  DDIMER 3.12*   Hgb A1c: No results for input(s): HGBA1C in the last 72 hours. Lipid Profile: No results for input(s): CHOL, HDL, LDLCALC, TRIG, CHOLHDL, LDLDIRECT in the last 72 hours. Thyroid function studies: No results for input(s): TSH, T4TOTAL, T3FREE, THYROIDAB in  the last 72 hours.  Invalid input(s): FREET3 Anemia work up: Recent Labs    07/15/20 2320  FERRITIN 29   Sepsis Labs: Recent Labs  Lab 07/15/20 2320  PROCALCITON <0.10  WBC 9.9   Microbiology Recent Results (from the past 240 hour(s))  SARS Coronavirus 2 by RT PCR (hospital order, performed in [facility] hospital lab) Nasopharyngeal Nasopharyngeal Swab     Status: Abnormal   Collection Time: 07/15/20 11:26 PM   Specimen: Nasopharyngeal Swab  Result Value Ref Range Status   SARS Coronavirus 2 POSITIVE (A) NEGATIVE Final    Comment: RESULT CALLED TO, READ BACK BY AND VERIFIED WITH: A SEDANO RN 07/16/20 0043 JDW (NOTE) SARS-CoV-2 target nucleic acids are DETECTED  SARS-CoV-2 RNA is generally detectable in upper respiratory specimens  during the acute phase of infection.  Positive results are indicative  of the presence of the identified virus, but do not rule out bacterial infection or co-infection with other pathogens not detected by the test.  Clinical correlation with patient history and  other diagnostic information is necessary to determine patient infection status.  The expected result is negative.  Fact Sheet for Patients:    BoilerBrush.com.cy   Fact Sheet for Healthcare Providers:   https://pope.com/    This test is not yet approved or cleared by the [place] FDA and  has been authorized for detection and/or diagnosis of SARS-CoV-2 by FDA under an Emergency Use Authorization (EUA).  This EUA will remain in effect (meaning this test can  be used) for the duration of  the COVID-19 declaration under Section 564(b)(1) of the Act, 21 U.S.C. section 360-bbb-3(b)(1), unless the authorization is terminated or revoked sooner.  Performed at [facility], [address], [place], [place] [address]      Medications:   . vitamin C  500 mg Oral Daily  . aspirin EC  81 mg Oral Daily  . docusate sodium  100 mg Oral BID  . enoxaparin (LOVENOX) injection  40 mg Subcutaneous Q24H  . guaiFENesin  600 mg Oral BID  . methylPREDNISolone (SOLU-MEDROL) injection  40 mg Intravenous Q12H   Followed by  . [START ON 07/19/2020] predniSONE  50 mg Oral Daily  . potassium chloride  10 mEq Oral BID  . prenatal multivitamin  1 tablet Oral Q1200  . zinc sulfate  220 mg Oral Daily   Continuous Infusions: . lactated ringers 125 mL/hr at 07/16/20 1028  . [START ON 07/17/2020] remdesivir 100 mg in NS 100 mL        LOS: 0 days   Marinda Elk  [facility]  07/16/2020, 1:10 PM

## 2020-07-16 NOTE — H&P (Signed)
Chief Complaint:  Shortness of Breath, Cough, Nasal Congestion, Emesis, and Diarrhea   First Provider Initiated Contact with Patient 07/15/20 2257     HPI: Terry [name] is a 24 y.o. G3P1011 at 54w4dho presents to maternity admissions reporting fever, shortness of breath, N/V, Congestion, whole body aches, intermittent contractions since Thursday.  Has not been tested or seen any medical professional .  States it got worse today.  . She reports good fetal movement, denies LOF, vaginal bleeding, vaginal itching/burning, urinary symptoms, h/a, dizziness, n/v, diarrhea, constipation or fever/chills.  She denies headache, visual changes or RUQ abdominal pain.  RN Note: Pt here for COVID symptoms that started Thursday. Pt reports nausea/vomiting/diarrhea, SOB, congestion, running nose, chills, feeling achy. Pt denies any known COVID exposures. Pt has not checked temperature at home, but denies feeling feverish. Pt reports some mild contractions irregularly. No LOF or vaginal bleeding. Good fetal movement.   Shortness of Breath This is a new problem. The current episode started in the past 7 days. The problem occurs constantly. The problem has been unchanged. Associated symptoms include abdominal pain, a fever, rhinorrhea, sputum production and vomiting. Pertinent negatives include no chest pain, leg swelling, sore throat or wheezing. Nothing aggravates the symptoms. She has tried nothing for the symptoms.  Cough This is a new problem. The current episode started in the past 7 days. The problem has been unchanged. Associated symptoms include a fever, myalgias, nasal congestion, rhinorrhea and shortness of breath. Pertinent negatives include no chest pain, sore throat or wheezing. Nothing aggravates the symptoms.  Emesis  This is a new problem. The current episode started in the past 7 days. The problem has been waxing and waning. The maximum temperature recorded prior to her arrival was 102 - 102.9 F.  Associated symptoms include abdominal pain, coughing, diarrhea, a fever and myalgias. Pertinent negatives include no chest pain.  Diarrhea  This is a new problem. The current episode started in the past 7 days. The problem has been unchanged. Associated symptoms include abdominal pain, coughing, a fever, myalgias and vomiting. She has tried nothing for the symptoms.     Past Medical History: Past Medical History:  Diagnosis Date  . Chlamydia   . Depression   . Gonorrhea     Past obstetric history: OB History  Gravida Para Term Preterm AB Living  _0 SAB TAB Ectopic Multiple Live Births  1 0   0 1    # Outcome Date GA Lbr Len/2nd Weight Sex Delivery Anes PTL Lv  3 Current           2 SAB 07/2019          1 Term 12/28/16 [id number] / 02:31 3830 g F Vag-Spont EPI  LIV    Past Surgical History: Past Surgical History:  Procedure Laterality Date  . NO PAST SURGERIES      Family History: Family History  Problem Relation Age of Onset  . Cancer Maternal Grandmother        pancreatic  . Diabetes Maternal Uncle   . Diabetes Mother   . Hypertension Mother   . Diabetes Father     Social History: Social History   Tobacco Use  . Smoking status: Never Smoker  . Smokeless tobacco: Never Used  Vaping Use  . Vaping Use: Never used  Substance Use Topics  . Alcohol use: No  . Drug use: No    Allergies: No Known Allergies  Meds:  Medications Prior to Admission  Medication Sig Dispense Refill Last Dose  . Blood Pressure Monitoring (BLOOD PRESSURE CUFF) MISC 1 Device by Does not apply route once a week. 1 each 0   . Blood Pressure Monitoring (BLOOD PRESSURE KIT) DEVI 1 kit by Does not apply route once a week. Check Blood Pressure regularly and record readings into the [employer] App.  Large Cuff.  DX O90.0 1 each 0   . Elastic Bandages & Supports MISC 1 Device by Does not apply route daily as needed. 1 Device 0   . ferrous sulfate 325 (65 FE) MG tablet Take 1  tablet (325 mg total) by mouth 2 (two) times daily with a meal. 60 tablet 5   . Prenatal Vit-Fe Fumarate-FA (PRENATAL COMPLETE) 14-0.4 MG TABS Take 1 tablet by mouth daily. 60 tablet 0   . Prenatal Vit-Fe Fumarate-FA (PRENATAL VITAMINS PO) Take by mouth.      . sertraline (ZOLOFT) 25 MG tablet Take 1 tablet (25 mg total) by mouth at bedtime. 60 tablet 2     I have reviewed patient's Past Medical Hx, Surgical Hx, Family Hx, Social Hx, medications and allergies.   ROS:  Review of Systems  Constitutional: Positive for fever.  HENT: Positive for rhinorrhea. Negative for sore throat.   Respiratory: Positive for cough, sputum production and shortness of breath. Negative for wheezing.   Cardiovascular: Negative for chest pain and leg swelling.  Gastrointestinal: Positive for abdominal pain, diarrhea and vomiting.  Musculoskeletal: Positive for myalgias.   Other systems negative  Physical Exam   Patient Vitals for the past 24 hrs:  BP Temp Temp src Pulse Resp SpO2  07/15/20 2248 (!) 101/41 -- -- (!) 106 -- --  07/15/20 2241 (!) 84/55 (!) 101.8 F (38.8 C) Oral (!) 118 (!) 36 90 %   On 2 liters of oxygen: Vitals:   07/16/20 0030 07/16/20 0039 07/16/20 0056 07/16/20 0114  BP: (!) 95/47     Pulse: (!) 113     Resp:   (!) 34   Temp:   (!) 100.6 F (38.1 C)   TempSrc:   Oral   SpO2:  98% 95% 99%    Constitutional: Ill-appearing, female in no acute distress.  Cardiovascular: mildly tachycardic, normal rhythm Respiratory: normal effort, expiratory wheezes upper lobes, diminished lower lobes GI: Abd soft, non-tender, gravid appropriate for gestational age.   No rebound or guarding. MS: Extremities nontender, no edema, normal ROM Neurologic: Alert and oriented x 4.  GU: Neg CVAT.  PELVIC EXAM: Dilation: 1.5 Effacement (%): 50 Station: -2 Presentation: Vertex Exam by:: Marie Williams, CNM   FHT:  Baseline 150 , moderate variability, accelerations present, no  decelerations Contractions:  Irregular     Labs: Results for orders placed or performed during the hospital encounter of 07/15/20 (from the past 24 hour(s))  CBC with Differential/Platelet     Status: Abnormal   Collection Time: 07/15/20 11:20 PM  Result Value Ref Range   WBC 9.9 4.0 - 10.5 K/uL   RBC 3.21 (L) 3.87 - 5.11 MIL/uL   Hemoglobin 10.4 (L) 12.0 - 15.0 g/dL   HCT 30.9 (L) 36 - 46 %   MCV 96.3 80.0 - 100.0 fL   MCH 32.4 26.0 - 34.0 pg   MCHC 33.7 30.0 - 36.0 g/dL   RDW 15.9 (H) 11.5 - 15.5 %   Platelets 254 150 - 400 K/uL   nRBC 0.5 (H) 0.0 - 0.2 %   Neutrophils Relative % 87 %     Neutro Abs 8.6 (H) 1.7 - 7.7 K/uL   Lymphocytes Relative 7 %   Lymphs Abs 0.7 0.7 - 4.0 K/uL   Monocytes Relative 3 %   Monocytes Absolute 0.3 0 - 1 K/uL   Eosinophils Relative 0 %   Eosinophils Absolute 0.0 0 - 0 K/uL   Basophils Relative 0 %   Basophils Absolute 0.0 0 - 0 K/uL   Immature Granulocytes 3 %   Abs Immature Granulocytes 0.27 (H) 0.00 - 0.07 K/uL   Polychromasia PRESENT   Comprehensive metabolic panel     Status: Abnormal   Collection Time: 07/15/20 11:20 PM  Result Value Ref Range   Sodium 132 (L) 135 - 145 mmol/L   Potassium 3.2 (L) 3.5 - 5.1 mmol/L   Chloride 100 98 - 111 mmol/L   CO2 21 (L) 22 - 32 mmol/L   Glucose, Bld 99 70 - 99 mg/dL   BUN 5 (L) 6 - 20 mg/dL   Creatinine, Ser 0.84 0.44 - 1.00 mg/dL   Calcium 7.9 (L) 8.9 - 10.3 mg/dL   Total Protein 5.7 (L) 6.5 - 8.1 g/dL   Albumin 2.4 (L) 3.5 - 5.0 g/dL   AST 23 15 - 41 U/L   ALT 13 0 - 44 U/L   Alkaline Phosphatase 89 38 - 126 U/L   Total Bilirubin 0.9 0.3 - 1.2 mg/dL   GFR calc non Af Amer >60 >60 mL/min   GFR calc Af Amer >60 >60 mL/min   Anion gap 11 5 - 15  Brain natriuretic peptide     Status: None   Collection Time: 07/15/20 11:20 PM  Result Value Ref Range   B Natriuretic Peptide 65.8 0.0 - 100.0 pg/mL  Troponin I (High Sensitivity)     Status: None   Collection Time: 07/15/20 11:20 PM  Result  Value Ref Range   Troponin I (High Sensitivity) 6 <18 ng/L  C-reactive protein     Status: Abnormal   Collection Time: 07/15/20 11:20 PM  Result Value Ref Range   CRP 8.4 (H) <1.0 mg/dL  D-dimer, quantitative (not at [facility])     Status: Abnormal   Collection Time: 07/15/20 11:20 PM  Result Value Ref Range   D-Dimer, Quant 3.12 (H) 0.00 - 0.50 ug/mL-FEU  Fibrinogen     Status: None   Collection Time: 07/15/20 11:20 PM  Result Value Ref Range   Fibrinogen 412 210 - 475 mg/dL  Ferritin     Status: None   Collection Time: 07/15/20 11:20 PM  Result Value Ref Range   Ferritin 29 11 - 307 ng/mL  SARS Coronavirus 2 by RT PCR (hospital order, performed in [facility] hospital lab) Nasopharyngeal Nasopharyngeal Swab     Status: Abnormal   Collection Time: 07/15/20 11:26 PM   Specimen: Nasopharyngeal Swab  Result Value Ref Range   SARS Coronavirus 2 POSITIVE (A) NEGATIVE    B/Positive/-- (03/23 0946)  Imaging:  DG Chest Portable 1 View  Result Date: 07/15/2020 CLINICAL DATA:  Short of breath, congestion, rhinorrhea, pregnant EXAM: PORTABLE CHEST 1 VIEW COMPARISON:  12/11/2019 FINDINGS: Single frontal view of the chest demonstrates multifocal bilateral airspace disease consistent with pneumonia. No effusion or pneumothorax. Cardiac silhouette is stable. IMPRESSION: 1. Multifocal bilateral airspace disease, favor pneumonia over edema. Pattern is consistent with COVID 19. Electronically Signed   By: Michael  Brown M.D.   On: 07/15/2020 23:42   MAU Course/MDM: I have ordered labs and reviewed results. D-Dimer and CRP are elevated.    K+ is mildly low. Covid test is positive.   NST reviewed and is reassuring with irregular mild contractions, GBS ordered.  Cervix slightly elevated.  Consult Dr Pratt and TRH with presentation, exam findings and test results.   Will admit to [facility]  Lovenox for prophylaxis Will await TR[facility] consult re: Remdesivir vs MCA  Assessment: Single IUP at [redacted]w[redacted]d Bilateral  pneumonia due to Covid Uterine irritability  Plan: Admit Routine orders per order set TRH/OB to comanage. MD to follow   Marie Williams CNM, MSN Certified Nurse-Midwife 07/15/2020 10:58 PM  

## 2020-07-16 NOTE — Consult Note (Signed)
Medical Consultation   Terry [name]  TOI:[id number]  DOB: Nov 24, 1995  DOA: 07/15/2020  PCP: [facility]   Requesting physician: Tinnie Gens, MD; Wynelle Bourgeois, CNM   Reason for consultation: COVID-19    History of Present Illness: Terry [name] is a 24 y.o. G3P1011 at [redacted]w[redacted]d presenting with 1 week of fever, shortness of breath, nausea, vomiting, aches, and malaise.  She felt significantly worse today, prompting her presentation.  Cough has been nonproductive.  She reports chest pain with deep breath or cough.  She also reports bilateral leg swelling and tenderness.  She denies headache or dizziness.  She was found to be positive for COVID-19, febrile to 38.8 C, saturating 90% on room air while at rest, tachypneic in the 30s, tachycardic in 110s, and with blood pressure of 84/55.  EKG demonstrates sinus tachycardia and chest x-ray is concerning for multifocal airspace disease in a pattern consistent with Covid pneumonia.  Chemistry panel features a sodium of 132, potassium 3.2, and albumin 2.4.  CBC notable for hemoglobin of 10.4, similar to priors.  Troponin and BNP are normal, procalcitonin undetectable, CRP elevated to 8.4, and D-dimer elevated to 3.12.  She was started on supplemental oxygen, systemic steroids, remdesivir, IV fluids, and supplemental potassium.    Review of Systems:  ROS As per HPI otherwise 10 point review of systems negative.    Past Medical History: Past Medical History:  Diagnosis Date  . Chlamydia   . Depression   . Gonorrhea     Past Surgical History: Past Surgical History:  Procedure Laterality Date  . NO PAST SURGERIES       Allergies:  No Known Allergies   Social History:  reports that she has never smoked. She has never used smokeless tobacco. She reports that she does not drink alcohol and does not use drugs.   Family History: Family History  Problem Relation Age of Onset  . Cancer  Maternal Grandmother        pancreatic  . Diabetes Maternal Uncle   . Diabetes Mother   . Hypertension Mother   . Diabetes Father     Physical Exam: Vitals:   07/16/20 0114 07/16/20 0144 07/16/20 0149 07/16/20 0307  BP:    (!) 81/37  Pulse:    (!) 113  Resp:    (!) 32  Temp:    (!) 100.9 F (38.3 C)  TempSrc:    Oral  SpO2: 99% 95% 97% 99%    Constitutional: Alert and awake, appears uncomfortable  Eyes: PERLA, EOMI, irises appear normal, anicteric sclera,  ENMT: external ears and nose appear normal   Neck: neck appears normal, no masses, normal ROM, no JVD  CVS: Rate ~110 and regular, normal pedal pulses  Respiratory:  Tachypneic, frequent cough, no wheezing. No pallor or cyanosis.  Abdomen: gravid uterus, nontender, nondistended, normal bowel sounds   Musculoskeletal: : no gross deformity. Calf tenderness bilaterally.  Neuro: Cranial nerves II-XII grossly intact. Sensation to light touch intact. Moving all extremities.  Psych: Calm. Cooperative.  Skin: no rashes or lesions or ulcers, no induration or nodules    Data reviewed:  I have personally reviewed following labs and imaging studies Labs:  CBC: Recent Labs  Lab 07/15/20 2320  WBC 9.9  NEUTROABS 8.6*  HGB 10.4*  HCT 30.9*  MCV 96.3  PLT 254    Basic Metabolic Panel: Recent Labs  Lab 07/15/20  2320  NA 132*  K 3.2*  CL 100  CO2 21*  GLUCOSE 99  BUN 5*  CREATININE 0.84  CALCIUM 7.9*   GFR CrCl cannot be calculated (Unknown ideal weight.). Liver Function Tests: Recent Labs  Lab 07/15/20 2320  AST 23  ALT 13  ALKPHOS 89  BILITOT 0.9  PROT 5.7*  ALBUMIN 2.4*   No results for input(s): LIPASE, AMYLASE in the last 168 hours. No results for input(s): AMMONIA in the last 168 hours. Coagulation profile No results for input(s): INR, PROTIME in the last 168 hours.  Cardiac Enzymes: No results for input(s): CKTOTAL, CKMB, CKMBINDEX, TROPONINI in the last 168 hours. BNP: Invalid input(s):  POCBNP CBG: No results for input(s): GLUCAP in the last 168 hours. D-Dimer Recent Labs    07/15/20 2320  DDIMER 3.12*   Hgb A1c No results for input(s): HGBA1C in the last 72 hours. Lipid Profile No results for input(s): CHOL, HDL, LDLCALC, TRIG, CHOLHDL, LDLDIRECT in the last 72 hours. Thyroid function studies No results for input(s): TSH, T4TOTAL, T3FREE, THYROIDAB in the last 72 hours.  Invalid input(s): FREET3 Anemia work up Recent Labs    07/15/20 2320  FERRITIN 29   Urinalysis    Component Value Date/Time   COLORURINE AMBER (A) 06/12/2020 1900   APPEARANCEUR HAZY (A) 06/12/2020 1900   LABSPEC 1.027 06/12/2020 1900   PHURINE 5.0 06/12/2020 1900   GLUCOSEU NEGATIVE 06/12/2020 1900   HGBUR NEGATIVE 06/12/2020 1900   BILIRUBINUR NEGATIVE 06/12/2020 1900   BILIRUBINUR negative 01/10/2019 1616   BILIRUBINUR neg 07/14/2016 0948   KETONESUR 5 (A) 06/12/2020 1900   PROTEINUR 100 (A) 06/12/2020 1900   UROBILINOGEN 0.2 09/16/2019 1236   NITRITE NEGATIVE 06/12/2020 1900   LEUKOCYTESUR NEGATIVE 06/12/2020 1900     Microbiology Recent Results (from the past 240 hour(s))  SARS Coronavirus 2 by RT PCR (hospital order, performed in [facility] hospital lab) Nasopharyngeal Nasopharyngeal Swab     Status: Abnormal   Collection Time: 07/15/20 11:26 PM   Specimen: Nasopharyngeal Swab  Result Value Ref Range Status   SARS Coronavirus 2 POSITIVE (A) NEGATIVE Final    Comment: RESULT CALLED TO, READ BACK BY AND VERIFIED WITH: A SEDANO RN 07/16/20 0043 JDW (NOTE) SARS-CoV-2 target nucleic acids are DETECTED  SARS-CoV-2 RNA is generally detectable in upper respiratory specimens  during the acute phase of infection.  Positive results are indicative  of the presence of the identified virus, but do not rule out bacterial infection or co-infection with other pathogens not detected by the test.  Clinical correlation with patient history and  other diagnostic information is necessary  to determine patient infection status.  The expected result is negative.  Fact Sheet for Patients:   BoilerBrush.com.cy   Fact Sheet for Healthcare Providers:   https://pope.com/    This test is not yet approved or cleared by the [place] FDA and  has been authorized for detection and/or diagnosis of SARS-CoV-2 by FDA under an Emergency Use Authorization (EUA).  This EUA will remain in effect (meaning this test can  be used) for the duration of  the COVID-19 declaration under Section 564(b)(1) of the Act, 21 U.S.C. section 360-bbb-3(b)(1), unless the authorization is terminated or revoked sooner.  Performed at [facility], [address], [place], [place] [address]        Inpatient Medications:   Scheduled Meds: . vitamin C  500 mg Oral Daily  . aspirin EC  81 mg Oral Daily  .  docusate sodium  100 mg Oral BID  . enoxaparin (LOVENOX) injection  40 mg Subcutaneous Q24H  . guaiFENesin  600 mg Oral BID  . methylPREDNISolone (SOLU-MEDROL) injection  40 mg Intravenous Q12H   Followed by  . [START ON 07/19/2020] predniSONE  50 mg Oral Daily  . potassium chloride  10 mEq Oral BID  . prenatal multivitamin  1 tablet Oral Q1200  . zinc sulfate  220 mg Oral Daily   Continuous Infusions: . lactated ringers    . lactated ringers    . remdesivir 200 mg in sodium chloride 0.9% 250 mL IVPB 200 mg (07/16/20 0459)   Followed by  . [START ON 07/17/2020] remdesivir 100 mg in NS 100 mL       Radiological Exams on Admission: DG Chest Portable 1 View  Result Date: 07/15/2020 CLINICAL DATA:  Short of breath, congestion, rhinorrhea, pregnant EXAM: PORTABLE CHEST 1 VIEW COMPARISON:  12/11/2019 FINDINGS: Single frontal view of the chest demonstrates multifocal bilateral airspace disease consistent with pneumonia. No effusion or pneumothorax. Cardiac silhouette is stable. IMPRESSION: 1. Multifocal bilateral airspace disease, favor pneumonia  over edema. Pattern is consistent with COVID 19. Electronically Signed   By: Sharlet Salina M.D.   On: 07/15/2020 23:42    Impression/Recommendations  1. COVID-19 pneumonia  - Presents with a week of fevers, cough, SOB, malaise and is found to be febrile, tachypneic in 30s at rest, saturation 90% on rm air at rest, and with CXR findings consistent with COVID pneumonia  - She has been appropriately started on remdesivir and systemic steroid  - Continue remdesivir and steroids, continue supplemental O2 as needed, trend markers, continue isolation and supportive care    2. Hypotension  - SBP 80s, patient seems to be tolerating well  - Most likely secondary to hypovolemia given N/V/D  - Normal WBC and undetectable procalcitonin argue against sepsis  - Give LR bolus now, aim for euvolemia   3. Chest pain; leg tenderness  - Pleuritic chest pain could be secondary to the pneumonia but she is also complaining of calf tenderness and if her hypotension is from PE would want to diagnose that now and start treatment   - Non-specific ST-T abnormality on EKG, troponin normal  - Check CTA chest, LE venous US     Thank you for this consultation.  Our [facility] hospitalist team will follow the patient with you.   Time Spent: 62 minutes.   [name] Tanylah Schnoebelen M.D. [facility] 07/16/2020, 5:05 AM

## 2020-07-16 NOTE — Progress Notes (Signed)
Lower extremity venous has been completed.   Preliminary results in CV Proc.   Blanch Media 07/16/2020 8:35 AM

## 2020-07-17 ENCOUNTER — Encounter (HOSPITAL_COMMUNITY): Payer: Self-pay | Admitting: Anesthesiology

## 2020-07-17 DIAGNOSIS — O98513 Other viral diseases complicating pregnancy, third trimester: Principal | ICD-10-CM

## 2020-07-17 LAB — C-REACTIVE PROTEIN: CRP: 9.3 mg/dL — ABNORMAL HIGH (ref <1.0)

## 2020-07-17 LAB — COMPREHENSIVE METABOLIC PANEL
ALT: 10 U/L (ref 0–44)
AST: 23 U/L (ref 15–41)
Albumin: 2 g/dL — ABNORMAL LOW (ref 3.5–5.0)
Alkaline Phosphatase: 82 U/L (ref 38–126)
Anion gap: 9 (ref 5–15)
BUN: 5 mg/dL — ABNORMAL LOW (ref 6–20)
CO2: 20 mmol/L — ABNORMAL LOW (ref 22–32)
Calcium: 8.4 mg/dL — ABNORMAL LOW (ref 8.9–10.3)
Chloride: 112 mmol/L — ABNORMAL HIGH (ref 98–111)
Creatinine, Ser: 0.61 mg/dL (ref 0.44–1.00)
GFR calc Af Amer: >60 mL/min (ref >60)
GFR calc non Af Amer: >60 mL/min (ref >60)
Glucose, Bld: 125 mg/dL — ABNORMAL HIGH (ref 70–99)
Potassium: 4 mmol/L (ref 3.5–5.1)
Sodium: 141 mmol/L (ref 135–145)
Total Bilirubin: 0.6 mg/dL (ref 0.3–1.2)
Total Protein: 5.1 g/dL — ABNORMAL LOW (ref 6.5–8.1)

## 2020-07-17 LAB — CBC WITH DIFFERENTIAL/PLATELET
Abs Immature Granulocytes: 0.62 10*3/uL — ABNORMAL HIGH (ref 0.00–0.07)
Basophils Absolute: 0 10*3/uL (ref 0.0–0.1)
Basophils Relative: 0 %
Eosinophils Absolute: 0 10*3/uL (ref 0.0–0.5)
Eosinophils Relative: 0 %
HCT: 32.8 % — ABNORMAL LOW (ref 36.0–46.0)
Hemoglobin: 10.7 g/dL — ABNORMAL LOW (ref 12.0–15.0)
Immature Granulocytes: 6 %
Lymphocytes Relative: 8 %
Lymphs Abs: 0.9 10*3/uL (ref 0.7–4.0)
MCH: 31.8 pg (ref 26.0–34.0)
MCHC: 32.6 g/dL (ref 30.0–36.0)
MCV: 97.3 fL (ref 80.0–100.0)
Monocytes Absolute: 0.3 10*3/uL (ref 0.1–1.0)
Monocytes Relative: 3 %
Neutro Abs: 9.4 10*3/uL — ABNORMAL HIGH (ref 1.7–7.7)
Neutrophils Relative %: 83 %
Platelets: 217 10*3/uL (ref 150–400)
RBC: 3.37 MIL/uL — ABNORMAL LOW (ref 3.87–5.11)
RDW: 16 % — ABNORMAL HIGH (ref 11.5–15.5)
WBC: 11.2 10*3/uL — ABNORMAL HIGH (ref 4.0–10.5)
nRBC: 0.2 % (ref 0.0–0.2)

## 2020-07-17 LAB — FERRITIN: Ferritin: 42 ng/mL (ref 11–307)

## 2020-07-17 LAB — D-DIMER, QUANTITATIVE: D-Dimer, Quant: 1.67 ug/mL-FEU — ABNORMAL HIGH (ref 0.00–0.50)

## 2020-07-17 LAB — MAGNESIUM: Magnesium: 1.7 mg/dL (ref 1.7–2.4)

## 2020-07-17 MED ORDER — LACTATED RINGERS IV BOLUS
1000.0000 mL | Freq: Once | INTRAVENOUS | Status: AC
  Administered 2020-07-17: 1000 mL via INTRAVENOUS

## 2020-07-17 NOTE — Progress Notes (Signed)
Patient ID: Terry [name], female   DOB: Sep 15, 1996, 24 y.o.   MRN: [id number] FACULTY PRACTICE ANTEPARTUM(COMPREHENSIVE) NOTE  Katrena Stehlin is a 24 y.o. G3P1011 at [redacted]w[redacted]d by early ultrasound who is admitted for Covid 19 pneumonia.   Fetal presentation is unsure. Length of Stay:  1  Days  Subjective: Breathing comfortably for most part Patient reports the fetal movement as active. Patient reports uterine contraction  activity as none. Patient reports  vaginal bleeding as none. Patient describes fluid per vagina as None.  Vitals:  Blood pressure (!) 93/51, pulse 78, temperature (!) 93.9 F (34.4 C), temperature source Rectal, resp. rate (!) 24, height 5\' 10"  (1.778 m), weight 88.9 kg, last menstrual period 11/09/2019, SpO2 93 %. Physical Examination:  General appearance - in mild to moderate distress and in bed covered in blankets Heart - normal rate and regular rhythm Abdomen - soft, nontender, nondistended Fundal Height:  size equals dates Extremities: extremities normal, atraumatic, no cyanosis or edema and Homans sign is negative, no sign of DVT  Membranes:intact  Fetal Monitoring:   Fetal Heart Rate A  Mode External filed at 07/17/2020 0230  Baseline Rate (A) 120 bpm filed at 07/17/2020 0230  Variability 6-25 BPM filed at 07/17/2020 0230  Accelerations 10 x 10 filed at 07/17/2020 0230  Decelerations None filed at 07/17/2020 0230     Labs:  Results for orders placed or performed during the hospital encounter of 07/15/20 (from the past 24 hour(s))  CBC with Differential/Platelet   Collection Time: 07/17/20  5:56 AM  Result Value Ref Range   WBC 11.2 (H) 4.0 - 10.5 K/uL   RBC 3.37 (L) 3.87 - 5.11 MIL/uL   Hemoglobin 10.7 (L) 12.0 - 15.0 g/dL   HCT 09/16/20 (L) 36 - 46 %   MCV 97.3 80.0 - 100.0 fL   MCH 31.8 26.0 - 34.0 pg   MCHC 32.6 30.0 - 36.0 g/dL   RDW 45.6 (H) 25.6 - 38.9 %   Platelets 217 150 - 400 K/uL   nRBC 0.2 0.0 - 0.2 %   Neutrophils Relative % PENDING %    Neutro Abs PENDING 1.7 - 7.7 K/uL   Band Neutrophils PENDING %   Lymphocytes Relative PENDING %   Lymphs Abs PENDING 0.7 - 4.0 K/uL   Monocytes Relative PENDING %   Monocytes Absolute PENDING 0 - 1 K/uL   Eosinophils Relative PENDING %   Eosinophils Absolute PENDING 0 - 0 K/uL   Basophils Relative PENDING %   Basophils Absolute PENDING 0 - 0 K/uL   WBC Morphology PENDING    RBC Morphology PENDING    Smear Review PENDING    Other PENDING %   nRBC PENDING 0 /100 WBC   Metamyelocytes Relative PENDING %   Myelocytes PENDING %   Promyelocytes Relative PENDING %   Blasts PENDING %   Immature Granulocytes PENDING %   Abs Immature Granulocytes PENDING 0.00 - 0.07 K/uL  Comprehensive metabolic panel   Collection Time: 07/17/20  5:56 AM  Result Value Ref Range   Sodium 141 135 - 145 mmol/L   Potassium 4.0 3.5 - 5.1 mmol/L   Chloride 112 (H) 98 - 111 mmol/L   CO2 20 (L) 22 - 32 mmol/L   Glucose, Bld 125 (H) 70 - 99 mg/dL   BUN 5 (L) 6 - 20 mg/dL   Creatinine, Ser 09/16/20 0.44 - 1.00 mg/dL   Calcium 8.4 (L) 8.9 - 10.3 mg/dL   Total Protein 5.1 (L) 6.5 -  8.1 g/dL   Albumin 2.0 (L) 3.5 - 5.0 g/dL   AST 23 15 - 41 U/L   ALT 10 0 - 44 U/L   Alkaline Phosphatase 82 38 - 126 U/L   Total Bilirubin 0.6 0.3 - 1.2 mg/dL   GFR calc non Af Amer >60 >60 mL/min   GFR calc Af Amer >60 >60 mL/min   Anion gap 9 5 - 15  C-reactive protein   Collection Time: 07/17/20  5:56 AM  Result Value Ref Range   CRP 9.3 (H) <1.0 mg/dL  D-dimer, quantitative (not at [facility])   Collection Time: 07/17/20  5:56 AM  Result Value Ref Range   D-Dimer, Quant 1.67 (H) 0.00 - 0.50 ug/mL-FEU  Ferritin   Collection Time: 07/17/20  5:56 AM  Result Value Ref Range   Ferritin 42 11 - 307 ng/mL  Magnesium   Collection Time: 07/17/20  5:56 AM  Result Value Ref Range   Magnesium 1.7 1.7 - 2.4 mg/dL     Medications:  Scheduled . vitamin C  500 mg Oral Daily  . aspirin EC  81 mg Oral Daily  . docusate sodium  100 mg  Oral BID  . enoxaparin (LOVENOX) injection  40 mg Subcutaneous Q24H  . guaiFENesin  600 mg Oral BID  . methylPREDNISolone (SOLU-MEDROL) injection  40 mg Intravenous Q12H   Followed by  . [START ON 07/19/2020] predniSONE  50 mg Oral Daily  . potassium chloride  10 mEq Oral BID  . prenatal multivitamin  1 tablet Oral Q1200  . sertraline  25 mg Oral QHS  . zinc sulfate  220 mg Oral Daily   I have reviewed the patient's current medications.  ASSESSMENT: Patient Active Problem List   Diagnosis Date Noted  . Pneumonia due to COVID-19 virus 07/16/2020  . Round ligament pain 06/09/2020  . History of prior pregnancy with short cervix, currently pregnant 02/25/2020  . Depression affecting pregnancy 01/29/2020  . Supervision of other normal pregnancy, antepartum 01/18/2020    PLAN: Up in chair with incentive spirometry Maintain O2 sat above 94% Dr. Radonna Ricker notified and gave recommendation  Scheryl Darter 07/17/2020,7:33 AM

## 2020-07-17 NOTE — Significant Event (Addendum)
Rapid Response Event Note   Reason for Call :  Second set of eyes: Covid+ antepartum pt now requiring 7LNC from [facility]. Hypothermic, 93.43F rectally.   Initial Focused Assessment:  Pt lying in bed. Awake, oriented x4. She follows commands and moves extremities appropriately. Lung sounds are diminished throughout. Skin is cool to touch, although pt states she feels warm. Pt is breathing comfortably with no accessory muscle use or increased work of breathing. Pt denies pain or discomfort.   VS: T 93.33F oral, BP 105/45, HR 76, RR 24, SpO2 94% on 7LNC Glucose: 125   Addendum 07/17/2020 1705: Called back by primary RN for pt having acute oxygen desaturation while up to bathroom. Per RN, pt had removed supplemental oxygen while up to BR. Initially pt called out stating she couldn't breathe. She endorsed dizziness and lightheadedness. Pt assisted back to bed and SpO2 was increased to 10L HFNC and able to be weaned back to 6L HFNC. Asked RN to notify primary attending of event. RN stated rapid response RN was not needed at this time. Please provide portable oxygen when pt is up to bathroom.   Interventions:  -Switched to [place]ter to provide adequate flow -Educated pt on pulmonary hygiene techniques listed below  Plan of Care:  -Encourage incentive spirometry use -Cough and deep breathing techniques -HOB >30 degrees -Wean oxygen as tolerated, SpO2 goal >94% per MD  -Monitor pt for increased work of breathing: accessory muscle use, labored breathing -Monitor pt for changes in level of consciousness or for change in mentation  -Follow-up with MD on order for warming blanket  Call rapid response for additional needs.  Event Summary:  MD Notified: Dr. Debroah Loop- RN informed me she spoke with provider this morning during rounds Call Time: 0800 Arrival Time: 0823 - delayed in arrival due to being called to another emergency End Time: 0850  Jennye Moccasin, RN

## 2020-07-17 NOTE — Progress Notes (Signed)
At 8:01am requested consult with Rapid Response RN regarding trending vital signs of patient (low temperature, low BP, requring 7L).   8:20am Rapid response RN came to room to evaluate patient and gave recommendations for high flo nasal cannula, continue increased use Incentive spirometry. Concern for temperature, continue to monitor.  08:54am patient switched to high flo [place] at 7L.  9:10am spoke with Dr. Macon Large regarding vital signs, high flo, provider reviewed strip. Continue to monitor

## 2020-07-17 NOTE — Progress Notes (Signed)
   07/17/20 0710  Vitals  Temp (!) 93.9 F (34.4 C)  Temp Source Rectal  BP (!) 93/51  BP Location Left Arm  BP Method Automatic  Pulse Rate 78  Pulse Rate Source Monitor  Resp (!) 24  Level of Consciousness  Level of Consciousness Alert  MEWS COLOR  MEWS Score Color Red  Oxygen Therapy  SpO2 93 %  O2 Device Nasal Cannula  O2 Flow Rate (L/min) 2 L/min  MEWS Score  MEWS Temp 2  MEWS Systolic 1  MEWS Pulse 0  MEWS RR 1  MEWS LOC 0  MEWS Score 4    RN unable to obtain an oral or axillary temp on pt w/ two separate attempts. Pt cool and clammy to touch, complaining of being "hot and sweaty." Rectal temp w/ VS obtained and provider made aware. Pt given warm blankets and provider to bedside.

## 2020-07-17 NOTE — Progress Notes (Signed)
At 1620, patient called out to nurses station stating "I can't breathe." RN entered room and found patient on side of bed. Patient reported going to the bathroom and being there around 5 minutes without oxygen. Patient reported feeling "lightheaded" and "dizzy." RN obtained vital signs, Saturation 89% on 6L High Flo, moved up 10L. Patient recovered and moved back down to [employer]. Rapid response notified and spoke with Hospitalist Dr. Helane Rima. MD stated to encourage incentive spirometer, up to chair, wear oxygen at all times. RN educated patient on wearing oxygen at all times, incentive spirometer, and patient was up to chair when RN left room. Continue to monitor.

## 2020-07-17 NOTE — Progress Notes (Addendum)
[facility] CONSULTPROGRESS NOTE    Progress Note  Terry ClinesKiaheshia Keeble  ZOX:[id number] DOB: 10/24/1996 DOA: 07/15/2020 PCP: [facility]     Brief Narrative:   Terry [name] is an 24 y.o. female 35 weeks and 4 days pregnant who came in with shortness of breath nausea vomiting and body aches was found to be febrile in the ED satting 90% on room air tachypneic and tachycardic with a blood pressure of 84/55.  Assessment/Plan:   Active Problems:   Pneumonia due to COVID-19 virus This morning she had to be placed on 7 L is now being weaned to 6, will try to keep her oxygen saturation greater than 94%.  Her respiratory status has worsened as her oxygen requirements have increased. She relates her breathing is unchanged, and physical exam is unremarkable.  She has remained afebrile blood pressure continues to be stable. She had a mild episode of hypothermia this morning but her temperature now is trending up. Continue IV remdesivir and steroids. Patient is unable to prone try to move her from left to right decubitus, out of bed to chair continue to use incentive spirometry at least 10 times an hour.  DVT prophylaxis: lovenox Family Communication:none Status is: Inpatient  Remains inpatient appropriate because:Hemodynamically unstable   Dispo: The patient is from: Home              Anticipated d/c is to: Home              Anticipated d/c date is: > 3 days              Patient currently is not medically stable to d/c.  Unable to determine.       Code Status:     Code Status Orders  (From admission, onward)         Start     Ordered   07/16/20 0233  Full code  Continuous        07/16/20 0245        Code Status History    Date Active Date Inactive Code Status Order ID Comments User Context   12/28/2016 0647 12/30/2016 1751 Full Code [id number]  Donette LarryBhambri, Melanie, CNM Inpatient   12/27/2016 2013 12/28/2016 0647 Full Code [id number]  Marylene LandKooistra,  Kathryn Lorraine, CNM Inpatient   Advance Care Planning Activity        IV Access:    Peripheral IV   Procedures and diagnostic studies:   CT ANGIO CHEST PE W OR WO CONTRAST  Result Date: 07/16/2020 CLINICAL DATA:  COVID symptoms. High probability for pulmonary embolism EXAM: CT ANGIOGRAPHY CHEST WITH CONTRAST TECHNIQUE: Multidetector CT imaging of the chest was performed using the standard protocol during bolus administration of intravenous contrast. Multiplanar CT image reconstructions and MIPs were obtained to evaluate the vascular anatomy. CONTRAST:  75mL OMNIPAQUE IOHEXOL 350 MG/ML SOLN COMPARISON:  None. FINDINGS: Cardiovascular: Normal heart size. No pericardial effusion. No pulmonary artery filling defects. Normal aorta. Mediastinum/Nodes: Negative for adenopathy or mass Lungs/Pleura: Ground-glass and consolidative opacities throughout the bilateral lungs. There is known COVID-19 infection. No edema or effusion. Upper Abdomen: Negative Musculoskeletal: Negative Review of the MIP images confirms the above findings. IMPRESSION: 1. COVID-19 with multifocal pneumonia. 2. Negative for pulmonary embolism. Electronically Signed   By: Marnee SpringJonathon  Watts M.D.   On: 07/16/2020 07:38   DG Chest Portable 1 View  Result Date: 07/15/2020 CLINICAL DATA:  Short of breath, congestion, rhinorrhea, pregnant EXAM: PORTABLE CHEST 1 VIEW COMPARISON:  12/11/2019  FINDINGS: Single frontal view of the chest demonstrates multifocal bilateral airspace disease consistent with pneumonia. No effusion or pneumothorax. Cardiac silhouette is stable. IMPRESSION: 1. Multifocal bilateral airspace disease, favor pneumonia over edema. Pattern is consistent with COVID 19. Electronically Signed   By: Sharlet Salina M.D.   On: 07/15/2020 23:42   VAS [place] LOWER EXTREMITY VENOUS (DVT)  Result Date: 07/16/2020  Lower Venous DVTStudy Other Indications: COVID, pregnant, d-dimer 3, bilateral leg tenderness and                    pleuritic  pain. Comparison Study: no prior Performing Technologist: Blanch Media RVS  Examination Guidelines: A complete evaluation includes B-mode imaging, spectral Doppler, color Doppler, and power Doppler as needed of all accessible portions of each vessel. Bilateral testing is considered an integral part of a complete examination. Limited examinations for reoccurring indications may be performed as noted. The reflux portion of the exam is performed with the patient in reverse Trendelenburg.  +---------+---------------+---------+-----------+----------+--------------+ RIGHT    CompressibilityPhasicitySpontaneityPropertiesThrombus Aging +---------+---------------+---------+-----------+----------+--------------+ CFV      Full           Yes      Yes                                 +---------+---------------+---------+-----------+----------+--------------+ SFJ      Full                                                        +---------+---------------+---------+-----------+----------+--------------+ FV Prox  Full                                                        +---------+---------------+---------+-----------+----------+--------------+ FV Mid   Full                                                        +---------+---------------+---------+-----------+----------+--------------+ FV DistalFull                                                        +---------+---------------+---------+-----------+----------+--------------+ PFV      Full                                                        +---------+---------------+---------+-----------+----------+--------------+ POP      Full           Yes      Yes                                 +---------+---------------+---------+-----------+----------+--------------+ PTV  Full                                                        +---------+---------------+---------+-----------+----------+--------------+ PERO      Full                                                        +---------+---------------+---------+-----------+----------+--------------+   +---------+---------------+---------+-----------+----------+--------------+ LEFT     CompressibilityPhasicitySpontaneityPropertiesThrombus Aging +---------+---------------+---------+-----------+----------+--------------+ CFV      Full           Yes      Yes                                 +---------+---------------+---------+-----------+----------+--------------+ SFJ      Full                                                        +---------+---------------+---------+-----------+----------+--------------+ FV Prox  Full                                                        +---------+---------------+---------+-----------+----------+--------------+ FV Mid   Full                                                        +---------+---------------+---------+-----------+----------+--------------+ FV DistalFull                                                        +---------+---------------+---------+-----------+----------+--------------+ PFV      Full                                                        +---------+---------------+---------+-----------+----------+--------------+ POP      Full           Yes      Yes                                 +---------+---------------+---------+-----------+----------+--------------+ PTV      Full                                                        +---------+---------------+---------+-----------+----------+--------------+  PERO     Full                                                        +---------+---------------+---------+-----------+----------+--------------+     Summary: BILATERAL: - No evidence of deep vein thrombosis seen in the lower extremities, bilaterally. - No evidence of superficial venous thrombosis in the lower extremities, bilaterally. -   *See table(s)  above for measurements and observations. Electronically signed by Sherald Hess MD on 07/16/2020 at 1:50:14 PM.    Final      Medical Consultants:    None.  Anti-Infectives:   remdesivir  Subjective:    Della Scrivener relates her breathing is unchanged compared to yesterday.  Objective:    Vitals:   07/17/20 0854 07/17/20 0900 07/17/20 0916 07/17/20 0954  BP:      Pulse:      Resp:      Temp:      TempSrc:      SpO2: 95% 90% 97% 98%  Weight:      Height:       SpO2: 98 % O2 Flow Rate (L/min): 7 L/min   Intake/Output Summary (Last 24 hours) at 07/17/2020 1050 Last data filed at 07/17/2020 0500 Gross per 24 hour  Intake 987.24 ml  Output 2 ml  Net 985.24 ml   Filed Weights   07/16/20 0545  Weight: 88.9 kg    Exam: General exam: In no acute distress. Respiratory system: Good air movement her lungs are clear this morning bilaterally. Cardiovascular system: S1 & S2 heard, RRR. No JVD. Gastrointestinal system: Abdomen is nondistended, soft and nontender.  Extremities: No pedal edema. Skin: No rashes, lesions or ulcers  Data Reviewed:    Labs: Basic Metabolic Panel: Recent Labs  Lab 07/15/20 2320 07/17/20 0556  NA 132* 141  K 3.2* 4.0  CL 100 112*  CO2 21* 20*  GLUCOSE 99 125*  BUN 5* 5*  CREATININE 0.84 0.61  CALCIUM 7.9* 8.4*  MG  --  1.7   GFR Estimated Creatinine Clearance: 129 mL/min (by C-G formula based on SCr of 0.61 mg/dL). Liver Function Tests: Recent Labs  Lab 07/15/20 2320 07/17/20 0556  AST 23 23  ALT 13 10  ALKPHOS 89 82  BILITOT 0.9 0.6  PROT 5.7* 5.1*  ALBUMIN 2.4* 2.0*   No results for input(s): LIPASE, AMYLASE in the last 168 hours. No results for input(s): AMMONIA in the last 168 hours. Coagulation profile No results for input(s): INR, PROTIME in the last 168 hours. COVID-19 Labs  Recent Labs    07/15/20 2320 07/17/20 0556  DDIMER 3.12* 1.67*  FERRITIN 29 42  CRP 8.4* 9.3*    Lab Results  Component  Value Date   SARSCOV2NAA POSITIVE (A) 07/15/2020    CBC: Recent Labs  Lab 07/15/20 2320 07/17/20 0556  WBC 9.9 11.2*  NEUTROABS 8.6* 9.4*  HGB 10.4* 10.7*  HCT 30.9* 32.8*  MCV 96.3 97.3  PLT 254 217   Cardiac Enzymes: No results for input(s): CKTOTAL, CKMB, CKMBINDEX, TROPONINI in the last 168 hours. BNP (last 3 results) No results for input(s): PROBNP in the last 8760 hours. CBG: No results for input(s): GLUCAP in the last 168 hours. D-Dimer: Recent Labs    07/15/20 2320 07/17/20 0556  DDIMER 3.12* 1.67*   Hgb A1c: No  results for input(s): HGBA1C in the last 72 hours. Lipid Profile: No results for input(s): CHOL, HDL, LDLCALC, TRIG, CHOLHDL, LDLDIRECT in the last 72 hours. Thyroid function studies: No results for input(s): TSH, T4TOTAL, T3FREE, THYROIDAB in the last 72 hours.  Invalid input(s): FREET3 Anemia work up: Recent Labs    07/15/20 2320 07/17/20 0556  FERRITIN 29 42   Sepsis Labs: Recent Labs  Lab 07/15/20 2320 07/17/20 0556  PROCALCITON <0.10  --   WBC 9.9 11.2*   Microbiology Recent Results (from the past 240 hour(s))  SARS Coronavirus 2 by RT PCR (hospital order, performed in [facility] hospital lab) Nasopharyngeal Nasopharyngeal Swab     Status: Abnormal   Collection Time: 07/15/20 11:26 PM   Specimen: Nasopharyngeal Swab  Result Value Ref Range Status   SARS Coronavirus 2 POSITIVE (A) NEGATIVE Final    Comment: RESULT CALLED TO, READ BACK BY AND VERIFIED WITH: A SEDANO RN 07/16/20 0043 JDW (NOTE) SARS-CoV-2 target nucleic acids are DETECTED  SARS-CoV-2 RNA is generally detectable in upper respiratory specimens  during the acute phase of infection.  Positive results are indicative  of the presence of the identified virus, but do not rule out bacterial infection or co-infection with other pathogens not detected by the test.  Clinical correlation with patient history and  other diagnostic information is necessary to determine  patient infection status.  The expected result is negative.  Fact Sheet for Patients:   BoilerBrush.com.cy   Fact Sheet for Healthcare Providers:   https://pope.com/    This test is not yet approved or cleared by the [place] FDA and  has been authorized for detection and/or diagnosis of SARS-CoV-2 by FDA under an Emergency Use Authorization (EUA).  This EUA will remain in effect (meaning this test can  be used) for the duration of  the COVID-19 declaration under Section 564(b)(1) of the Act, 21 U.S.C. section 360-bbb-3(b)(1), unless the authorization is terminated or revoked sooner.  Performed at [facility], [address], [place], [place] [address]   Culture, beta strep (group b only)     Status: None (Preliminary result)   Collection Time: 07/15/20 11:26 PM   Specimen: Vaginal/Rectal; Genital  Result Value Ref Range Status   Specimen Description VAGINAL/RECTAL  Final   Special Requests NONE  Final   Culture   Final    CULTURE REINCUBATED FOR BETTER GROWTH Performed at [facility], [address] [place], [place] [address]    Report Status PENDING  Incomplete     Medications:   . vitamin C  500 mg Oral Daily  . aspirin EC  81 mg Oral Daily  . docusate sodium  100 mg Oral BID  . enoxaparin (LOVENOX) injection  40 mg Subcutaneous Q24H  . guaiFENesin  600 mg Oral BID  . methylPREDNISolone (SOLU-MEDROL) injection  40 mg Intravenous Q12H   Followed by  . [START ON 07/19/2020] predniSONE  50 mg Oral Daily  . prenatal multivitamin  1 tablet Oral Q1200  . sertraline  25 mg Oral QHS  . zinc sulfate  220 mg Oral Daily   Continuous Infusions: . remdesivir 100 mg in NS 100 mL 100 mg (07/17/20 0250)      LOS: 1 day   Marinda Elk  [facility]  07/17/2020, 10:50 AM

## 2020-07-18 DIAGNOSIS — U071 COVID-19: Secondary | ICD-10-CM | POA: Diagnosis present

## 2020-07-18 DIAGNOSIS — Z3A36 36 weeks gestation of pregnancy: Secondary | ICD-10-CM

## 2020-07-18 LAB — COMPREHENSIVE METABOLIC PANEL
ALT: 13 U/L (ref 0–44)
AST: 29 U/L (ref 15–41)
Albumin: 2 g/dL — ABNORMAL LOW (ref 3.5–5.0)
Alkaline Phosphatase: 82 U/L (ref 38–126)
Anion gap: 8 (ref 5–15)
BUN: <5 mg/dL — ABNORMAL LOW (ref 6–20)
CO2: 21 mmol/L — ABNORMAL LOW (ref 22–32)
Calcium: 7.9 mg/dL — ABNORMAL LOW (ref 8.9–10.3)
Chloride: 108 mmol/L (ref 98–111)
Creatinine, Ser: 0.61 mg/dL (ref 0.44–1.00)
GFR calc Af Amer: >60 mL/min (ref >60)
GFR calc non Af Amer: >60 mL/min (ref >60)
Glucose, Bld: 110 mg/dL — ABNORMAL HIGH (ref 70–99)
Potassium: 3.5 mmol/L (ref 3.5–5.1)
Sodium: 137 mmol/L (ref 135–145)
Total Bilirubin: 0.6 mg/dL (ref 0.3–1.2)
Total Protein: 5.1 g/dL — ABNORMAL LOW (ref 6.5–8.1)

## 2020-07-18 LAB — CBC WITH DIFFERENTIAL/PLATELET
Abs Immature Granulocytes: 0.65 10*3/uL — ABNORMAL HIGH (ref 0.00–0.07)
Basophils Absolute: 0 10*3/uL (ref 0.0–0.1)
Basophils Relative: 0 %
Eosinophils Absolute: 0.1 10*3/uL (ref 0.0–0.5)
Eosinophils Relative: 0 %
HCT: 31 % — ABNORMAL LOW (ref 36.0–46.0)
Hemoglobin: 9.9 g/dL — ABNORMAL LOW (ref 12.0–15.0)
Immature Granulocytes: 5 %
Lymphocytes Relative: 5 %
Lymphs Abs: 0.6 10*3/uL — ABNORMAL LOW (ref 0.7–4.0)
MCH: 31.2 pg (ref 26.0–34.0)
MCHC: 31.9 g/dL (ref 30.0–36.0)
MCV: 97.8 fL (ref 80.0–100.0)
Monocytes Absolute: 0.4 10*3/uL (ref 0.1–1.0)
Monocytes Relative: 3 %
Neutro Abs: 10.4 10*3/uL — ABNORMAL HIGH (ref 1.7–7.7)
Neutrophils Relative %: 87 %
Platelets: 219 10*3/uL (ref 150–400)
RBC: 3.17 MIL/uL — ABNORMAL LOW (ref 3.87–5.11)
RDW: 16.2 % — ABNORMAL HIGH (ref 11.5–15.5)
WBC: 12.1 10*3/uL — ABNORMAL HIGH (ref 4.0–10.5)
nRBC: 0 % (ref 0.0–0.2)

## 2020-07-18 LAB — TYPE AND SCREEN
ABO/RH(D): B POS
Antibody Screen: NEGATIVE

## 2020-07-18 LAB — FERRITIN: Ferritin: 47 ng/mL (ref 11–307)

## 2020-07-18 LAB — D-DIMER, QUANTITATIVE: D-Dimer, Quant: 2.49 ug/mL-FEU — ABNORMAL HIGH (ref 0.00–0.50)

## 2020-07-18 LAB — MAGNESIUM: Magnesium: 1.5 mg/dL — ABNORMAL LOW (ref 1.7–2.4)

## 2020-07-18 LAB — CULTURE, BETA STREP (GROUP B ONLY)

## 2020-07-18 LAB — C-REACTIVE PROTEIN: CRP: 4.6 mg/dL — ABNORMAL HIGH (ref <1.0)

## 2020-07-18 MED ORDER — SALINE SPRAY 0.65 % NA SOLN
1.0000 | NASAL | Status: DC | PRN
  Administered 2020-07-18: 1 via NASAL
  Filled 2020-07-18: qty 44

## 2020-07-18 NOTE — Progress Notes (Signed)
Patient ID: Terry [name], female   DOB: 30-Mar-1996, 24 y.o.   MRN: [id number] FACULTY PRACTICE ANTEPARTUM(COMPREHENSIVE) NOTE  Terry [name] is a 24 y.o. G3P1011 at [redacted]w[redacted]d by early ultrasound who is admitted for Covid 19 pneumonia.   Fetal presentation is unsure. Length of Stay:  2  Days  Subjective: Patient is not compliant with her oxygen therapy, keeps taking it off. When she desaturates, her fetus has concerning decelerations.  Declined Lovenox yesterday evening.  Says we "are not doing anything for her". Wants to go home. On oxygen high flow by [place] 9L! Patient reports the fetal movement as active. Patient reports uterine contraction  activity as none. Patient reports  vaginal bleeding as none. Patient describes fluid per vagina as none.  Vitals:  Blood pressure (!) 98/42, pulse 95, temperature 97.8 F (36.6 C), temperature source Oral, resp. rate (!) 24, height 5\' 10"  (1.778 m), weight 88.9 kg, last menstrual period 11/09/2019, SpO2 96 %.    Physical Examination: General appearance - in mild to moderate distress and in bed covered in blankets Lungs - working to breathe Heart - normal rate and regular rhythm Abdomen - soft, nontender, nondistended Fundal Height:  size equals dates Extremities: extremities normal, atraumatic, no cyanosis or edema and Homans sign is negative, no sign of DVT  Membranes:intact  Fetal Monitoring:   Fetal Heart Rate A  Mode External   Baseline Rate (A) 145 bpm  Variability 6-25 BPM  Accelerations Present  Decelerations Periodic variable decelerations, some lasting 1-2 minutes but returning to reassuring baseline  Tocometer: Rare uterine contractions   Labs:  Results for orders placed or performed during the hospital encounter of 07/15/20 (from the past 24 hour(s))  CBC with Differential/Platelet   Collection Time: 07/18/20  6:21 AM  Result Value Ref Range   WBC 12.1 (H) 4.0 - 10.5 K/uL   RBC 3.17 (L) 3.87 - 5.11 MIL/uL   Hemoglobin 9.9 (L)  12.0 - 15.0 g/dL   HCT 09/17/20 (L) 36 - 46 %   MCV 97.8 80.0 - 100.0 fL   MCH 31.2 26.0 - 34.0 pg   MCHC 31.9 30.0 - 36.0 g/dL   RDW 17.5 (H) 10.2 - 58.5 %   Platelets 219 150 - 400 K/uL   nRBC 0.0 0.0 - 0.2 %   Neutrophils Relative % 87 %   Neutro Abs 10.4 (H) 1.7 - 7.7 K/uL   Lymphocytes Relative 5 %   Lymphs Abs 0.6 (L) 0.7 - 4.0 K/uL   Monocytes Relative 3 %   Monocytes Absolute 0.4 0 - 1 K/uL   Eosinophils Relative 0 %   Eosinophils Absolute 0.1 0 - 0 K/uL   Basophils Relative 0 %   Basophils Absolute 0.0 0 - 0 K/uL   Immature Granulocytes 5 %   Abs Immature Granulocytes 0.65 (H) 0.00 - 0.07 K/uL  Comprehensive metabolic panel   Collection Time: 07/18/20  6:21 AM  Result Value Ref Range   Sodium 137 135 - 145 mmol/L   Potassium 3.5 3.5 - 5.1 mmol/L   Chloride 108 98 - 111 mmol/L   CO2 21 (L) 22 - 32 mmol/L   Glucose, Bld 110 (H) 70 - 99 mg/dL   BUN <5 (L) 6 - 20 mg/dL   Creatinine, Ser 09/17/20 0.44 - 1.00 mg/dL   Calcium 7.9 (L) 8.9 - 10.3 mg/dL   Total Protein 5.1 (L) 6.5 - 8.1 g/dL   Albumin 2.0 (L) 3.5 - 5.0 g/dL   AST 29 15 -  41 U/L   ALT 13 0 - 44 U/L   Alkaline Phosphatase 82 38 - 126 U/L   Total Bilirubin 0.6 0.3 - 1.2 mg/dL   GFR calc non Af Amer >60 >60 mL/min   GFR calc Af Amer >60 >60 mL/min   Anion gap 8 5 - 15  C-reactive protein   Collection Time: 07/18/20  6:21 AM  Result Value Ref Range   CRP 4.6 (H) <1.0 mg/dL  D-dimer, quantitative (not at [facility])   Collection Time: 07/18/20  6:21 AM  Result Value Ref Range   D-Dimer, Quant 2.49 (H) 0.00 - 0.50 ug/mL-FEU  Ferritin   Collection Time: 07/18/20  6:21 AM  Result Value Ref Range   Ferritin 47 11 - 307 ng/mL  Magnesium   Collection Time: 07/18/20  6:21 AM  Result Value Ref Range   Magnesium 1.5 (L) 1.7 - 2.4 mg/dL  Type and screen [facility]   Collection Time: 07/18/20  6:21 AM  Result Value Ref Range   ABO/RH(D) B POS    Antibody Screen NEG    Sample Expiration       07/21/2020,2359 Performed at [facility], [address] [place], [place] [address]      Medications:  Scheduled . vitamin C  500 mg Oral Daily  . aspirin EC  81 mg Oral Daily  . docusate sodium  100 mg Oral BID  . enoxaparin (LOVENOX) injection  40 mg Subcutaneous Q24H  . guaiFENesin  600 mg Oral BID  . methylPREDNISolone (SOLU-MEDROL) injection  40 mg Intravenous Q12H   Followed by  . [START ON 07/19/2020] predniSONE  50 mg Oral Daily  . prenatal multivitamin  1 tablet Oral Q1200  . sertraline  25 mg Oral QHS  . zinc sulfate  220 mg Oral Daily   . remdesivir 100 mg in NS 100 mL 100 mg (07/18/20 0321)    I have reviewed the patient's current medications.  ASSESSMENT: Principal Problem:   Pneumonia due to COVID-19 virus Active Problems:   [redacted] weeks gestation of pregnancy   COVID-19 affecting pregnancy in third trimester  PLAN: Had a conversation with patient about how she has a Retail [employer] of physicians, nurses and staff members working very hard to keep her and her baby alive, and giving her all the therapies we can. Emphasized importance of keeping her oxygen on, for her and the baby. She reports she wanted to go home, I told her that given her high level of oxygen needs, this is akin to a death sentence.  I will talk with Dr. David Stall about possibly adding Actemra to her regimen given her escalating oxygen requirements, this is rarely used in pregnancy. Emphasized to patient that the next option for her could be ICU admission, possible ventilator, and emergent cesarean delivery of her infant.  She was very unhappy with me and got on the phone with a family member during the encounter.  Reassuring FHT for now, will continue to monitor Will continue Remdesivir, steroids, Lovenox as ordered, oxygen as needed Continue close observation.   Jaynie Collins, MD, FACOG Obstetrician & Gynecologist, [facility] for [employer], [facility]

## 2020-07-18 NOTE — Progress Notes (Addendum)
[facility] CONSULTPROGRESS NOTE    Progress Note  Terry [name]  BTD:[id number] DOB: Feb 03, 1996 DOA: 07/15/2020 PCP: [facility], [facility]     Brief Narrative:   Terry [name] is an 24 y.o. female 35 weeks and 4 days pregnant who came in with shortness of breath nausea vomiting and body aches was found to be febrile in the ED satting 90% on room air tachypneic and tachycardic with a blood pressure of 84/55.  Assessment/Plan:   Acute resp failure with Hypoxia Pneumonia due to COVID-19 virus She is taking her oxygen frequently and refusing her lovenox. This morning she had to be placed on 7 L is now being weaned to 5, she is removing her oxygen as she relates she has a stuff  Nose. Give nasal spray. We will use a humidifier on nasal cannula. I have encourage to keep her oxygen on, as this will benefit her and her baby. She seems to understand and relates she will keep it on. She has remained afebrile, no leukocytosis. Continue IV remdesivir and steroids. Out of bed to chair continue to use incentive spirometry at least 10 times an hour. I spoke to her about Actemra, the treatment plan and use of medications and known side effects were discussed with patient/family, they were clearly explained that there is no proven definitive treatment for COVID-19 infection, any medications used here are based on published clinical articles/anecdotal data which are not peer-reviewed or randomized control trials.  Complete risks and long-term side effects are unknown, however in the best clinical judgment they seem to be of some clinical benefit rather than medical risks.  She related would like to think about it.   DVT prophylaxis: lovenox Family Communication:none Status is: Inpatient  Remains inpatient appropriate because:Hemodynamically unstable   Dispo: The patient is from: Home              Anticipated d/c is to: Home              Anticipated d/c date is: > 3  days              Patient currently is not medically stable to d/c.  Unable to determine.       Code Status:     Code Status Orders  (From admission, onward)           Start     Ordered   07/16/20 0233  Full code  Continuous        07/16/20 0245           Code Status History     Date Active Date Inactive Code Status Order ID Comments User Context   12/28/2016 0647 12/30/2016 1751 Full Code [id number]  Donette Larry, CNM Inpatient   12/27/2016 2013 12/28/2016 0647 Full Code [id number]  Marylene Land, CNM Inpatient   Advance Care Planning Activity         IV Access:   Peripheral IV   Procedures and diagnostic studies:   No results found.   Medical Consultants:   None.  Anti-Infectives:   remdesivir  Subjective:    Terry [name] relates her breathing is unchanged.  Objective:    Vitals:   07/18/20 1030 07/18/20 1100 07/18/20 1130 07/18/20 1200  BP:      Pulse:      Resp:      Temp:      TempSrc:      SpO2: 99% 98% 97% 99%  Weight:  Height:       SpO2: 99 % O2 Flow Rate (L/min): 9 L/min   Intake/Output Summary (Last 24 hours) at 07/18/2020 1204 Last data filed at 07/18/2020 0547 Gross per 24 hour  Intake 600 ml  Output 1450 ml  Net -850 ml   Filed Weights   07/16/20 0545  Weight: 88.9 kg    Exam: General exam: In no acute distress. Respiratory system: Good air movement and clear to auscultation. Cardiovascular system: S1 & S2 heard, RRR. No JVD. Gastrointestinal system: distended abdomen due to pregnanacy Extremities: No pedal edema. Skin: No rashes, lesions or ulcers Data Reviewed:    Labs: Basic Metabolic Panel: Recent Labs  Lab 07/15/20 2320 07/15/20 2320 07/17/20 0556 07/18/20 0621  NA 132*  --  141 137  K 3.2*   < > 4.0 3.5  CL 100  --  112* 108  CO2 21*  --  20* 21*  GLUCOSE 99  --  125* 110*  BUN 5*  --  5* <5*  CREATININE 0.84  --  0.61 0.61  CALCIUM 7.9*  --  8.4* 7.9*  MG  --    --  1.7 1.5*   < > = values in this interval not displayed.   GFR Estimated Creatinine Clearance: 129 mL/min (by C-G formula based on SCr of 0.61 mg/dL). Liver Function Tests: Recent Labs  Lab 07/15/20 2320 07/17/20 0556 07/18/20 0621  AST 23 23 29   ALT 13 10 13   ALKPHOS 89 82 82  BILITOT 0.9 0.6 0.6  PROT 5.7* 5.1* 5.1*  ALBUMIN 2.4* 2.0* 2.0*   No results for input(s): LIPASE, AMYLASE in the last 168 hours. No results for input(s): AMMONIA in the last 168 hours. Coagulation profile No results for input(s): INR, PROTIME in the last 168 hours. COVID-19 Labs  Recent Labs    07/15/20 2320 07/17/20 0556 07/18/20 0621  DDIMER 3.12* 1.67* 2.49*  FERRITIN 29 42 47  CRP 8.4* 9.3* 4.6*    Lab Results  Component Value Date   SARSCOV2NAA POSITIVE (A) 07/15/2020    CBC: Recent Labs  Lab 07/15/20 2320 07/17/20 0556 07/18/20 0621  WBC 9.9 11.2* 12.1*  NEUTROABS 8.6* 9.4* 10.4*  HGB 10.4* 10.7* 9.9*  HCT 30.9* 32.8* 31.0*  MCV 96.3 97.3 97.8  PLT 254 217 219   Cardiac Enzymes: No results for input(s): CKTOTAL, CKMB, CKMBINDEX, TROPONINI in the last 168 hours. BNP (last 3 results) No results for input(s): PROBNP in the last 8760 hours. CBG: No results for input(s): GLUCAP in the last 168 hours. D-Dimer: Recent Labs    07/17/20 0556 07/18/20 0621  DDIMER 1.67* 2.49*   Hgb A1c: No results for input(s): HGBA1C in the last 72 hours. Lipid Profile: No results for input(s): CHOL, HDL, LDLCALC, TRIG, CHOLHDL, LDLDIRECT in the last 72 hours. Thyroid function studies: No results for input(s): TSH, T4TOTAL, T3FREE, THYROIDAB in the last 72 hours.  Invalid input(s): FREET3 Anemia work up: Recent Labs    07/17/20 0556 07/18/20 0621  FERRITIN 42 47   Sepsis Labs: Recent Labs  Lab 07/15/20 2320 07/17/20 0556 07/18/20 0621  PROCALCITON <0.10  --   --   WBC 9.9 11.2* 12.1*   Microbiology Recent Results (from the past 240 hour(s))  SARS Coronavirus 2 by RT  PCR (hospital order, performed in [facility] hospital lab) Nasopharyngeal Nasopharyngeal Swab     Status: Abnormal   Collection Time: 07/15/20 11:26 PM   Specimen: Nasopharyngeal Swab  Result Value Ref Range  Status   SARS Coronavirus 2 POSITIVE (A) NEGATIVE Final    Comment: RESULT CALLED TO, READ BACK BY AND VERIFIED WITH: A SEDANO RN 07/16/20 0043 JDW (NOTE) SARS-CoV-2 target nucleic acids are DETECTED  SARS-CoV-2 RNA is generally detectable in upper respiratory specimens  during the acute phase of infection.  Positive results are indicative  of the presence of the identified virus, but do not rule out bacterial infection or co-infection with other pathogens not detected by the test.  Clinical correlation with patient history and  other diagnostic information is necessary to determine patient infection status.  The expected result is negative.  Fact Sheet for Patients:   BoilerBrush.com.cy   Fact Sheet for Healthcare Providers:   https://pope.com/    This test is not yet approved or cleared by the [place] FDA and  has been authorized for detection and/or diagnosis of SARS-CoV-2 by FDA under an Emergency Use Authorization (EUA).  This EUA will remain in effect (meaning this test can  be used) for the duration of  the COVID-19 declaration under Section 564(b)(1) of the Act, 21 U.S.C. section 360-bbb-3(b)(1), unless the authorization is terminated or revoked sooner.  Performed at [facility], [address], [place], [place] [address]   Culture, beta strep (group b only)     Status: None   Collection Time: 07/15/20 11:26 PM   Specimen: Vaginal/Rectal; Genital  Result Value Ref Range Status   Specimen Description VAGINAL/RECTAL  Final   Special Requests NONE  Final   Culture   Final    NO GROUP B STREP (S.AGALACTIAE) ISOLATED Performed at [facility], [address] [place], [place] [address]    Report  Status 07/18/2020 FINAL  Final     Medications:    vitamin C  500 mg Oral Daily   aspirin EC  81 mg Oral Daily   docusate sodium  100 mg Oral BID   enoxaparin (LOVENOX) injection  40 mg Subcutaneous Q24H   guaiFENesin  600 mg Oral BID   methylPREDNISolone (SOLU-MEDROL) injection  40 mg Intravenous Q12H   Followed by   Melene Muller ON 07/19/2020] predniSONE  50 mg Oral Daily   prenatal multivitamin  1 tablet Oral Q1200   sertraline  25 mg Oral QHS   zinc sulfate  220 mg Oral Daily   Continuous Infusions:  remdesivir 100 mg in NS 100 mL 100 mg (07/18/20 0321)      LOS: 2 days   Marinda Elk  [facility]  07/18/2020, 12:04 PM

## 2020-07-18 NOTE — Progress Notes (Signed)
Pt sitting up from 0720 until 0732. Difficult to trace baby. EFM tracing maternal HR during this time.

## 2020-07-18 NOTE — Progress Notes (Signed)
This RN was called to pt's room at 0735, stating that she had trouble breathing. Pt had an O2 sat of 90%. When RN entered room, pt was not wearing [place]. Pt stated that she was tired of wearing the [place] because she was still having trouble breathing and she felt as though it was not helping her. RN educated pt about the importance of wearing the [place], so that her and her baby could have enough oxygen. Pt verbalized understanding. Oxygen was placed back on pt at 9L on the high flo .

## 2020-07-19 LAB — COMPREHENSIVE METABOLIC PANEL
ALT: 16 U/L (ref 0–44)
AST: 36 U/L (ref 15–41)
Albumin: 2 g/dL — ABNORMAL LOW (ref 3.5–5.0)
Alkaline Phosphatase: 97 U/L (ref 38–126)
Anion gap: 10 (ref 5–15)
BUN: <5 mg/dL — ABNORMAL LOW (ref 6–20)
CO2: 20 mmol/L — ABNORMAL LOW (ref 22–32)
Calcium: 7.9 mg/dL — ABNORMAL LOW (ref 8.9–10.3)
Chloride: 107 mmol/L (ref 98–111)
Creatinine, Ser: 0.68 mg/dL (ref 0.44–1.00)
GFR calc Af Amer: >60 mL/min (ref >60)
GFR calc non Af Amer: >60 mL/min (ref >60)
Glucose, Bld: 107 mg/dL — ABNORMAL HIGH (ref 70–99)
Potassium: 3.2 mmol/L — ABNORMAL LOW (ref 3.5–5.1)
Sodium: 137 mmol/L (ref 135–145)
Total Bilirubin: 0.4 mg/dL (ref 0.3–1.2)
Total Protein: 5 g/dL — ABNORMAL LOW (ref 6.5–8.1)

## 2020-07-19 LAB — FERRITIN: Ferritin: 37 ng/mL (ref 11–307)

## 2020-07-19 LAB — CBC WITH DIFFERENTIAL/PLATELET
Abs Immature Granulocytes: 0 10*3/uL (ref 0.00–0.07)
Band Neutrophils: 2 %
Basophils Absolute: 0 10*3/uL (ref 0.0–0.1)
Basophils Relative: 0 %
Eosinophils Absolute: 0 10*3/uL (ref 0.0–0.5)
Eosinophils Relative: 0 %
HCT: 32.7 % — ABNORMAL LOW (ref 36.0–46.0)
Hemoglobin: 10.5 g/dL — ABNORMAL LOW (ref 12.0–15.0)
Lymphocytes Relative: 10 %
Lymphs Abs: 0.9 10*3/uL (ref 0.7–4.0)
MCH: 31.7 pg (ref 26.0–34.0)
MCHC: 32.1 g/dL (ref 30.0–36.0)
MCV: 98.8 fL (ref 80.0–100.0)
Monocytes Absolute: 0.3 10*3/uL (ref 0.1–1.0)
Monocytes Relative: 3 %
Neutro Abs: 7.4 10*3/uL (ref 1.7–7.7)
Neutrophils Relative %: 85 %
Platelets: 239 10*3/uL (ref 150–400)
RBC: 3.31 MIL/uL — ABNORMAL LOW (ref 3.87–5.11)
RDW: 16 % — ABNORMAL HIGH (ref 11.5–15.5)
WBC: 8.5 10*3/uL (ref 4.0–10.5)
nRBC: 0.5 % — ABNORMAL HIGH (ref 0.0–0.2)

## 2020-07-19 LAB — C-REACTIVE PROTEIN: CRP: 5 mg/dL — ABNORMAL HIGH (ref <1.0)

## 2020-07-19 LAB — D-DIMER, QUANTITATIVE: D-Dimer, Quant: 2.51 ug/mL-FEU — ABNORMAL HIGH (ref 0.00–0.50)

## 2020-07-19 LAB — MAGNESIUM: Magnesium: 1.7 mg/dL (ref 1.7–2.4)

## 2020-07-19 NOTE — Progress Notes (Addendum)
Patient ID: Terry [name], female   DOB: 02-14-1996, 24 y.o.   MRN: [id number] FACULTY PRACTICE ANTEPARTUM(COMPREHENSIVE) NOTE  Terry [name] is a 24 y.o. G3P1011 at [redacted]w[redacted]d by early ultrasound who is admitted for COVID 19 pneumonia.   Fetal presentation is unsure. Length of Stay:  3  Days  Subjective: Patient is compliant with her oxygen therapy, on high flow 5L. Still has periodic concerning FHR decelerations. Received Lovenox yesterday and today. Patient reports the fetal movement as active. Patient reports uterine contraction  activity as none. Patient reports  vaginal bleeding as none. Patient describes fluid per vagina as none.  Vitals:  Blood pressure (!) 96/52, pulse 99, temperature 98.4 F (36.9 C), temperature source Oral, resp. rate 20, height 5\' 10"  (1.778 m), weight 88.9 kg, last menstrual period 11/09/2019, SpO2 94 %.   Physical Examination: General  - in mild distress and in bed Lungs - working to breathe and coughing Heart - normal rate and regular rhythm Abdomen - soft, nontender, nondistended Fundal Height:  size equals dates Extremities: extremities normal, atraumatic, no cyanosis or edema and Homans sign is negative, no sign of DVT  Membranes:intact  Fetal Monitoring:   Fetal Heart Rate A  Mode External   Baseline Rate (A) 145 bpm  Variability 6-25 BPM  Accelerations Present  Decelerations Periodic variable decelerations, some lasting 1-2 minutes but returning to reassuring baseline  Tocometer: Rare uterine contractions (1-2 per hour)   Labs:  Results for orders placed or performed during the hospital encounter of 07/15/20 (from the past 24 hour(s))  CBC with Differential/Platelet   Collection Time: 07/19/20  5:06 AM  Result Value Ref Range   WBC 8.5 4.0 - 10.5 K/uL   RBC 3.31 (L) 3.87 - 5.11 MIL/uL   Hemoglobin 10.5 (L) 12.0 - 15.0 g/dL   HCT 09/18/20 (L) 36 - 46 %   MCV 98.8 80.0 - 100.0 fL   MCH 31.7 26.0 - 34.0 pg   MCHC 32.1 30.0 - 36.0 g/dL   RDW  34.7 (H) 42.5 - 15.5 %   Platelets 239 150 - 400 K/uL   nRBC 0.5 (H) 0.0 - 0.2 %   Neutrophils Relative % 85 %   Neutro Abs 7.4 1.7 - 7.7 K/uL   Band Neutrophils 2 %   Lymphocytes Relative 10 %   Lymphs Abs 0.9 0.7 - 4.0 K/uL   Monocytes Relative 3 %   Monocytes Absolute 0.3 0 - 1 K/uL   Eosinophils Relative 0 %   Eosinophils Absolute 0.0 0 - 0 K/uL   Basophils Relative 0 %   Basophils Absolute 0.0 0 - 0 K/uL   Abs Immature Granulocytes 0.00 0.00 - 0.07 K/uL  Comprehensive metabolic panel   Collection Time: 07/19/20  5:06 AM  Result Value Ref Range   Sodium 137 135 - 145 mmol/L   Potassium 3.2 (L) 3.5 - 5.1 mmol/L   Chloride 107 98 - 111 mmol/L   CO2 20 (L) 22 - 32 mmol/L   Glucose, Bld 107 (H) 70 - 99 mg/dL   BUN <5 (L) 6 - 20 mg/dL   Creatinine, Ser 09/18/20 0.44 - 1.00 mg/dL   Calcium 7.9 (L) 8.9 - 10.3 mg/dL   Total Protein 5.0 (L) 6.5 - 8.1 g/dL   Albumin 2.0 (L) 3.5 - 5.0 g/dL   AST 36 15 - 41 U/L   ALT 16 0 - 44 U/L   Alkaline Phosphatase 97 38 - 126 U/L   Total Bilirubin 0.4 0.3 -  1.2 mg/dL   GFR calc non Af Amer >60 >60 mL/min   GFR calc Af Amer >60 >60 mL/min   Anion gap 10 5 - 15  C-reactive protein   Collection Time: 07/19/20  5:06 AM  Result Value Ref Range   CRP 5.0 (H) <1.0 mg/dL  Ferritin   Collection Time: 07/19/20  5:06 AM  Result Value Ref Range   Ferritin 37 11 - 307 ng/mL  Magnesium   Collection Time: 07/19/20  5:06 AM  Result Value Ref Range   Magnesium 1.7 1.7 - 2.4 mg/dL  D-dimer, quantitative (not at [facility])   Collection Time: 07/19/20  7:36 AM  Result Value Ref Range   D-Dimer, Quant 2.51 (H) 0.00 - 0.50 ug/mL-FEU     Medications:  Scheduled . vitamin C  500 mg Oral Daily  . aspirin EC  81 mg Oral Daily  . docusate sodium  100 mg Oral BID  . enoxaparin (LOVENOX) injection  40 mg Subcutaneous Q24H  . guaiFENesin  600 mg Oral BID  . predniSONE  50 mg Oral Daily  . prenatal multivitamin  1 tablet Oral Q1200  . sertraline  25 mg Oral QHS   . zinc sulfate  220 mg Oral Daily   . remdesivir 100 mg in NS 100 mL 100 mg (07/19/20 0341)    I have reviewed the patient's current medications.  ASSESSMENT: Principal Problem:   Pneumonia due to COVID-19 virus Active Problems:   [redacted] weeks gestation of pregnancy   COVID-19 affecting pregnancy in third trimester  PLAN: Continue Oxygen therapy to maintain saturations.  Will continue Remdesivir for 5 day course, Steroids x 10 day course, Lovenox as ordered Appreciate TRH recommendations Will continue to monitor FHT, overall reassuring Continue close observation.   Jaynie Collins, MD, FACOG Obstetrician & Gynecologist, [facility] [employer], [facility]

## 2020-07-19 NOTE — Progress Notes (Signed)
[facility] CONSULTPROGRESS NOTE    Progress Note  Terry [name]  HBZ:[id number] DOB: July 02, 1996 DOA: 07/15/2020 PCP: [facility]     Brief Narrative:   Terry [name] is an 24 y.o. female 35 weeks and 4 days pregnant who came in with shortness of breath nausea vomiting and body aches was found to be febrile in the ED satting 90% on room air tachypneic and tachycardic with a blood pressure of 84/55.  Assessment/Plan:   Acute resp failure with Hypoxia Pneumonia due to COVID-19 virus: He has now been weaned down to 4 L sometimes 5, try ot keep saturations greater than 94%. We will try to encourage the patient to continue to use incentive spirometry and flutter valve. Give her a friend nasal spray as needed she has remained afebrile. We will continue IV remdesivir for 5 days, continue steroids for a total of 10 days. Try to keep her out of bed to chair as much as possible. Inflammatory markers are pending this morning. She has remained afebrile.  DVT prophylaxis: lovenox Family Communication:none Status is: Inpatient  Remains inpatient appropriate because:Hemodynamically unstable   Dispo: The patient is from: Home              Anticipated d/c is to: Home              Anticipated d/c date is: > 3 days              Patient currently is not medically stable to d/c.  Unable to determine.       Code Status:     Code Status Orders  (From admission, onward)           Start     Ordered   07/16/20 0233  Full code  Continuous        07/16/20 0245           Code Status History     Date Active Date Inactive Code Status Order ID Comments User Context   12/28/2016 0647 12/30/2016 1751 Full Code [id number]  Donette Larry, CNM Inpatient   12/27/2016 2013 12/28/2016 0647 Full Code [id number]  Marylene Land, CNM Inpatient   Advance Care Planning Activity         IV Access:    Peripheral IV   Procedures and  diagnostic studies:   No results found.   Medical Consultants:    None.  Anti-Infectives:   remdesivir  Subjective:    Terry [name] relates her breathing is unchanged this morning.  Objective:    Vitals:   07/19/20 0906 07/19/20 0945 07/19/20 1010 07/19/20 1032  BP:      Pulse:      Resp:      Temp:      TempSrc:      SpO2: 97% 95% 96% 94%  Weight:      Height:       SpO2: 94 % O2 Flow Rate (L/min): 5 L/min   Intake/Output Summary (Last 24 hours) at 07/19/2020 1043 Last data filed at 07/19/2020 0917 Gross per 24 hour  Intake 120 ml  Output 1750 ml  Net -1630 ml   Filed Weights   07/16/20 0545  Weight: 88.9 kg    Exam: General exam: In no acute distress. Respiratory system: Good air movement and clear to auscultation. Cardiovascular system: S1 & S2 heard, RRR. No JVD. Gastrointestinal system: Abdomen is nondistended, soft and nontender.  Extremities: No pedal edema. Skin: No rashes, lesions or  ulcers  Data Reviewed:    Labs: Basic Metabolic Panel: Recent Labs  Lab 07/15/20 2320 07/15/20 2320 07/17/20 0556 07/17/20 0556 07/18/20 0621 07/19/20 0506  NA 132*  --  141  --  137 137  K 3.2*   < > 4.0   < > 3.5 3.2*  CL 100  --  112*  --  108 107  CO2 21*  --  20*  --  21* 20*  GLUCOSE 99  --  125*  --  110* 107*  BUN 5*  --  5*  --  <5* <5*  CREATININE 0.84  --  0.61  --  0.61 0.68  CALCIUM 7.9*  --  8.4*  --  7.9* 7.9*  MG  --   --  1.7  --  1.5* 1.7   < > = values in this interval not displayed.   GFR Estimated Creatinine Clearance: 129 mL/min (by C-G formula based on SCr of 0.68 mg/dL). Liver Function Tests: Recent Labs  Lab 07/15/20 2320 07/17/20 0556 07/18/20 0621 07/19/20 0506  AST 23 23 29  36  ALT 13 10 13 16   ALKPHOS 89 82 82 97  BILITOT 0.9 0.6 0.6 0.4  PROT 5.7* 5.1* 5.1* 5.0*  ALBUMIN 2.4* 2.0* 2.0* 2.0*   No results for input(s): LIPASE, AMYLASE in the last 168 hours. No results for input(s): AMMONIA in the last  168 hours. Coagulation profile No results for input(s): INR, PROTIME in the last 168 hours. COVID-19 Labs  Recent Labs    07/17/20 0556 07/18/20 0621 07/19/20 0506 07/19/20 0736  DDIMER 1.67* 2.49*  --  2.51*  FERRITIN 42 47 37  --   CRP 9.3* 4.6* 5.0*  --     Lab Results  Component Value Date   SARSCOV2NAA POSITIVE (A) 07/15/2020    CBC: Recent Labs  Lab 07/15/20 2320 07/17/20 0556 07/18/20 0621 07/19/20 0506  WBC 9.9 11.2* 12.1* 8.5  NEUTROABS 8.6* 9.4* 10.4* 7.4  HGB 10.4* 10.7* 9.9* 10.5*  HCT 30.9* 32.8* 31.0* 32.7*  MCV 96.3 97.3 97.8 98.8  PLT 254 217 219 239   Cardiac Enzymes: No results for input(s): CKTOTAL, CKMB, CKMBINDEX, TROPONINI in the last 168 hours. BNP (last 3 results) No results for input(s): PROBNP in the last 8760 hours. CBG: No results for input(s): GLUCAP in the last 168 hours. D-Dimer: Recent Labs    07/18/20 0621 07/19/20 0736  DDIMER 2.49* 2.51*   Hgb A1c: No results for input(s): HGBA1C in the last 72 hours. Lipid Profile: No results for input(s): CHOL, HDL, LDLCALC, TRIG, CHOLHDL, LDLDIRECT in the last 72 hours. Thyroid function studies: No results for input(s): TSH, T4TOTAL, T3FREE, THYROIDAB in the last 72 hours.  Invalid input(s): FREET3 Anemia work up: Recent Labs    07/18/20 0621 07/19/20 0506  FERRITIN 47 37   Sepsis Labs: Recent Labs  Lab 07/15/20 2320 07/17/20 0556 07/18/20 0621 07/19/20 0506  PROCALCITON <0.10  --   --   --   WBC 9.9 11.2* 12.1* 8.5   Microbiology Recent Results (from the past 240 hour(s))  SARS Coronavirus 2 by RT PCR (hospital order, performed in [facility] hospital lab) Nasopharyngeal Nasopharyngeal Swab     Status: Abnormal   Collection Time: 07/15/20 11:26 PM   Specimen: Nasopharyngeal Swab  Result Value Ref Range Status   SARS Coronavirus 2 POSITIVE (A) NEGATIVE Final    Comment: RESULT CALLED TO, READ BACK BY AND VERIFIED WITH: A SEDANO RN 07/16/20 0043 JDW (NOTE)  SARS-CoV-2  target nucleic acids are DETECTED  SARS-CoV-2 RNA is generally detectable in upper respiratory specimens  during the acute phase of infection.  Positive results are indicative  of the presence of the identified virus, but do not rule out bacterial infection or co-infection with other pathogens not detected by the test.  Clinical correlation with patient history and  other diagnostic information is necessary to determine patient infection status.  The expected result is negative.  Fact Sheet for Patients:   BoilerBrush.com.cy   Fact Sheet for Healthcare Providers:   https://pope.com/    This test is not yet approved or cleared by the [place] FDA and  has been authorized for detection and/or diagnosis of SARS-CoV-2 by FDA under an Emergency Use Authorization (EUA).  This EUA will remain in effect (meaning this test can  be used) for the duration of  the COVID-19 declaration under Section 564(b)(1) of the Act, 21 U.S.C. section 360-bbb-3(b)(1), unless the authorization is terminated or revoked sooner.  Performed at [facility], [address], [place], [place] [address]   Culture, beta strep (group b only)     Status: None   Collection Time: 07/15/20 11:26 PM   Specimen: Vaginal/Rectal; Genital  Result Value Ref Range Status   Specimen Description VAGINAL/RECTAL  Final   Special Requests NONE  Final   Culture   Final    NO GROUP B STREP (S.AGALACTIAE) ISOLATED Performed at [facility], [address] [place], [place] [address]    Report Status 07/18/2020 FINAL  Final     Medications:   . vitamin C  500 mg Oral Daily  . aspirin EC  81 mg Oral Daily  . docusate sodium  100 mg Oral BID  . enoxaparin (LOVENOX) injection  40 mg Subcutaneous Q24H  . guaiFENesin  600 mg Oral BID  . predniSONE  50 mg Oral Daily  . prenatal multivitamin  1 tablet Oral Q1200  . sertraline  25 mg Oral QHS  . zinc sulfate  220 mg  Oral Daily   Continuous Infusions: . remdesivir 100 mg in NS 100 mL 100 mg (07/19/20 0341)      LOS: 3 days   Marinda Elk  [facility]  07/19/2020, 10:43 AM

## 2020-07-20 LAB — COMPREHENSIVE METABOLIC PANEL
ALT: 18 U/L (ref 0–44)
AST: 36 U/L (ref 15–41)
Albumin: 1.9 g/dL — ABNORMAL LOW (ref 3.5–5.0)
Alkaline Phosphatase: 105 U/L (ref 38–126)
Anion gap: 9 (ref 5–15)
BUN: <5 mg/dL — ABNORMAL LOW (ref 6–20)
CO2: 21 mmol/L — ABNORMAL LOW (ref 22–32)
Calcium: 8 mg/dL — ABNORMAL LOW (ref 8.9–10.3)
Chloride: 107 mmol/L (ref 98–111)
Creatinine, Ser: 0.73 mg/dL (ref 0.44–1.00)
GFR calc Af Amer: >60 mL/min (ref >60)
GFR calc non Af Amer: >60 mL/min (ref >60)
Glucose, Bld: 102 mg/dL — ABNORMAL HIGH (ref 70–99)
Potassium: 3.1 mmol/L — ABNORMAL LOW (ref 3.5–5.1)
Sodium: 137 mmol/L (ref 135–145)
Total Bilirubin: 0.5 mg/dL (ref 0.3–1.2)
Total Protein: 5 g/dL — ABNORMAL LOW (ref 6.5–8.1)

## 2020-07-20 LAB — CBC WITH DIFFERENTIAL/PLATELET
Abs Immature Granulocytes: 0.45 10*3/uL — ABNORMAL HIGH (ref 0.00–0.07)
Basophils Absolute: 0.1 10*3/uL (ref 0.0–0.1)
Basophils Relative: 1 %
Eosinophils Absolute: 0 10*3/uL (ref 0.0–0.5)
Eosinophils Relative: 0 %
HCT: 32.8 % — ABNORMAL LOW (ref 36.0–46.0)
Hemoglobin: 10.6 g/dL — ABNORMAL LOW (ref 12.0–15.0)
Immature Granulocytes: 5 %
Lymphocytes Relative: 12 %
Lymphs Abs: 1.1 10*3/uL (ref 0.7–4.0)
MCH: 31.5 pg (ref 26.0–34.0)
MCHC: 32.3 g/dL (ref 30.0–36.0)
MCV: 97.6 fL (ref 80.0–100.0)
Monocytes Absolute: 0.4 10*3/uL (ref 0.1–1.0)
Monocytes Relative: 4 %
Neutro Abs: 7.3 10*3/uL (ref 1.7–7.7)
Neutrophils Relative %: 78 %
Platelets: 250 10*3/uL (ref 150–400)
RBC: 3.36 MIL/uL — ABNORMAL LOW (ref 3.87–5.11)
RDW: 16 % — ABNORMAL HIGH (ref 11.5–15.5)
WBC: 9.3 10*3/uL (ref 4.0–10.5)
nRBC: 0.6 % — ABNORMAL HIGH (ref 0.0–0.2)

## 2020-07-20 LAB — MAGNESIUM: Magnesium: 1.7 mg/dL (ref 1.7–2.4)

## 2020-07-20 LAB — C-REACTIVE PROTEIN: CRP: 5.2 mg/dL — ABNORMAL HIGH (ref <1.0)

## 2020-07-20 LAB — D-DIMER, QUANTITATIVE: D-Dimer, Quant: 3.08 ug/mL-FEU — ABNORMAL HIGH (ref 0.00–0.50)

## 2020-07-20 LAB — FERRITIN: Ferritin: 32 ng/mL (ref 11–307)

## 2020-07-20 MED ORDER — POTASSIUM CHLORIDE CRYS ER 20 MEQ PO TBCR
20.0000 meq | EXTENDED_RELEASE_TABLET | Freq: Two times a day (BID) | ORAL | Status: AC
  Administered 2020-07-20 – 2020-07-22 (×4): 20 meq via ORAL
  Filled 2020-07-20 (×4): qty 1

## 2020-07-20 MED ORDER — PREDNISONE 20 MG PO TABS
70.0000 mg | ORAL_TABLET | Freq: Every day | ORAL | Status: DC
  Administered 2020-07-21 – 2020-07-25 (×5): 70 mg via ORAL
  Filled 2020-07-20 (×5): qty 4

## 2020-07-20 NOTE — Progress Notes (Signed)
Called first attending Dr. Donavan Foil regarding update on patient. Notified regarding labs (K, D Dimer, CRP), increase in contractions, patient able to sleep through contractions, and updated that patient is currently at 7L HFNC. RN would try to wean patient down as tolerated this afternoon per Hospitalist request. No new orders. Continue to monitor.

## 2020-07-20 NOTE — Progress Notes (Signed)
Patient called out stating "unable to breath" @0010 . RN to bedside and observed O2 saturation 84-86%. HFNC O2 increase from 7L to 15L with no saturation improvement. Incentive spirometer use, turn-cough, and deep breathing attempted with no saturation improvement. Patient able to expel mucous w/ cough but denied improvement in ability to breath.   Rapid Response nurse 01-09-1995 call for assistance @0024 . NRB mask applied with 15L of O2. See Rapid Response note for further response to desaturation event. Patient O2 saturation returned to 99% @0040 .    Dr. Norva Riffle notified of desaturation event, care team interventions, and patient response @0125 . Maternal vital signs and fetal monitoring reviewed by Dr. . Fetal monitoring during and immediately after event reassuring w/ good variability, 10x10 accelerations, and patients normal deceleration pattern noted per Dr. . Orders received to follow Rapid Response recommendations. No new OB interventions at this time due to positive fetal and maternal response post desaturation event.

## 2020-07-20 NOTE — Significant Event (Signed)
Rapid Response Event Note   Reason for Call : Acute Hypoxic episode- Sats 85-86%  Initial Focused Assessment:  Nursing staff notified me of an acute desaturation episode with sats 85-86%. I instructed them to place pt on a NRB mask over the telephone. Upon my arrival, Terry [name] was sitting upright on the couch, tachypneic, but not in distress. She endorsed productive cough and SOB, but did not have any accessory muscle use. BBS fine crackles and diminished bases. On NRB mask at 15L, her sats increased up to 99-100% (after clearing some mucous). Pt was transitioned back to [place] HFNC at 12L with sats 97% , RR 30-32.  Pt was also using fetal monitoring during the episode. See their note for that documentation.   Interventions:  -NRB mask 15L -Weaned back to [place] HFNC at 12L  Plan of Care:  -Wean O2 to maintain sats per ordered sat goal -Use NRB mask with [place] HFNC as recovery from acute desaturations occur. Then wean off NRB mask -Notify primary svc of events -Notify primary svc and/or RRRN for further assistance   MD Notified: Dr. Shauna Hugh at 0209 Call Time: 0024 Arrival Time: 0030 End Time: 0130  Rose Fillers, RN

## 2020-07-20 NOTE — Progress Notes (Signed)
[facility] CONSULTPROGRESS NOTE    Progress Note  Carolie Mcilrath  GUY:[id number] DOB: 01-19-1996 DOA: 07/15/2020 PCP: [facility]     Brief Narrative:   Wannetta Langland is an 24 y.o. female 35 weeks and 4 days pregnant who came in with shortness of breath nausea vomiting and body aches was found to be febrile in the ED satting 90% on room air tachypneic and tachycardic with a blood pressure of 84/55.  Assessment/Plan:   Acute resp failure with Hypoxia Pneumonia due to COVID-19 virus: She is now back up to 9 L of high flow nasal cannula to keep saturations greater than 94%. Try to wean he down to 5-6 L, she will desat. with ambulation. We will try to encourage the patient to continue to use incentive spirometry and flutter valve. She has completed her course of IV remdesivir, she is currently on IV steroids. Her inflammatory markers are slowly trending down. Try to get out of bed to chair. She has remained afebrile.  DVT prophylaxis: lovenox Family Communication:none Status is: Inpatient  Remains inpatient appropriate because:Hemodynamically unstable   Dispo: The patient is from: Home              Anticipated d/c is to: Home              Anticipated d/c date is: > 3 days              Patient currently is not medically stable to d/c.  Unable to determine.  Code Status:     Code Status Orders  (From admission, onward)           Start     Ordered   07/16/20 0233  Full code  Continuous        07/16/20 0245           Code Status History     Date Active Date Inactive Code Status Order ID Comments User Context   12/28/2016 0647 12/30/2016 1751 Full Code [id number]  Donette Larry, CNM Inpatient   12/27/2016 2013 12/28/2016 0647 Full Code [id number]  Marylene Land, CNM Inpatient   Advance Care Planning Activity         IV Access:    Peripheral IV   Procedures and diagnostic studies:   No results  found.   Medical Consultants:    None.  Anti-Infectives:   remdesivir  Subjective:    Daneli Butkiewicz relates her breathing is unchanged  Objective:    Vitals:   07/20/20 0739 07/20/20 0816 07/20/20 0840 07/20/20 1022  BP: (!) 93/47     Pulse: 93     Resp: 20     Temp: 98.3 F (36.8 C)     TempSrc: Oral     SpO2:  96% 99% 99%  Weight:      Height:       SpO2: 99 % O2 Flow Rate (L/min): 9 L/min   Intake/Output Summary (Last 24 hours) at 07/20/2020 1111 Last data filed at 07/20/2020 1000 Gross per 24 hour  Intake 960 ml  Output 1550 ml  Net -590 ml   Filed Weights   07/16/20 0545  Weight: 88.9 kg    Exam: General exam: In no acute distress. Respiratory system: Good air movement and clear to auscultation. Cardiovascular system: S1 & S2 heard, RRR. No JVD. Gastrointestinal system: Abdomen is nondistended, soft and nontender.  Extremities: No pedal edema. Skin: No rashes, lesions or ulcers  Data Reviewed:  Labs: Basic Metabolic Panel: Recent Labs  Lab 07/15/20 2320 07/15/20 2320 07/17/20 0556 07/17/20 0556 07/18/20 0621 07/18/20 0621 07/19/20 0506 07/20/20 0741  NA 132*  --  141  --  137  --  137 137  K 3.2*   < > 4.0   < > 3.5   < > 3.2* 3.1*  CL 100  --  112*  --  108  --  107 107  CO2 21*  --  20*  --  21*  --  20* 21*  GLUCOSE 99  --  125*  --  110*  --  107* 102*  BUN 5*  --  5*  --  <5*  --  <5* <5*  CREATININE 0.84  --  0.61  --  0.61  --  0.68 0.73  CALCIUM 7.9*  --  8.4*  --  7.9*  --  7.9* 8.0*  MG  --   --  1.7  --  1.5*  --  1.7 1.7   < > = values in this interval not displayed.   GFR Estimated Creatinine Clearance: 129 mL/min (by C-G formula based on SCr of 0.73 mg/dL). Liver Function Tests: Recent Labs  Lab 07/15/20 2320 07/17/20 0556 07/18/20 0621 07/19/20 0506 07/20/20 0741  AST 23 23 29  36 36  ALT 13 10 13 16 18   ALKPHOS 89 82 82 97 105  BILITOT 0.9 0.6 0.6 0.4 0.5  PROT 5.7* 5.1* 5.1* 5.0* 5.0*  ALBUMIN  2.4* 2.0* 2.0* 2.0* 1.9*   No results for input(s): LIPASE, AMYLASE in the last 168 hours. No results for input(s): AMMONIA in the last 168 hours. Coagulation profile No results for input(s): INR, PROTIME in the last 168 hours. COVID-19 Labs  Recent Labs    07/18/20 0621 07/19/20 0506 07/19/20 0736 07/20/20 0741  DDIMER 2.49*  --  2.51* 3.08*  FERRITIN 47 37  --  32  CRP 4.6* 5.0*  --  5.2*    Lab Results  Component Value Date   SARSCOV2NAA POSITIVE (A) 07/15/2020    CBC: Recent Labs  Lab 07/15/20 2320 07/17/20 0556 07/18/20 0621 07/19/20 0506 07/20/20 0741  WBC 9.9 11.2* 12.1* 8.5 9.3  NEUTROABS 8.6* 9.4* 10.4* 7.4 7.3  HGB 10.4* 10.7* 9.9* 10.5* 10.6*  HCT 30.9* 32.8* 31.0* 32.7* 32.8*  MCV 96.3 97.3 97.8 98.8 97.6  PLT 254 217 219 239 250   Cardiac Enzymes: No results for input(s): CKTOTAL, CKMB, CKMBINDEX, TROPONINI in the last 168 hours. BNP (last 3 results) No results for input(s): PROBNP in the last 8760 hours. CBG: No results for input(s): GLUCAP in the last 168 hours. D-Dimer: Recent Labs    07/19/20 0736 07/20/20 0741  DDIMER 2.51* 3.08*   Hgb A1c: No results for input(s): HGBA1C in the last 72 hours. Lipid Profile: No results for input(s): CHOL, HDL, LDLCALC, TRIG, CHOLHDL, LDLDIRECT in the last 72 hours. Thyroid function studies: No results for input(s): TSH, T4TOTAL, T3FREE, THYROIDAB in the last 72 hours.  Invalid input(s): FREET3 Anemia work up: Recent Labs    07/19/20 0506 07/20/20 0741  FERRITIN 37 32   Sepsis Labs: Recent Labs  Lab 07/15/20 2320 07/15/20 2320 07/17/20 0556 07/18/20 0621 07/19/20 0506 07/20/20 0741  PROCALCITON <0.10  --   --   --   --   --   WBC 9.9   < > 11.2* 12.1* 8.5 9.3   < > = values in this interval not displayed.   Microbiology Recent Results (  from the past 240 hour(s))  SARS Coronavirus 2 by RT PCR (hospital order, performed in [facility] hospital lab) Nasopharyngeal Nasopharyngeal Swab      Status: Abnormal   Collection Time: 07/15/20 11:26 PM   Specimen: Nasopharyngeal Swab  Result Value Ref Range Status   SARS Coronavirus 2 POSITIVE (A) NEGATIVE Final    Comment: RESULT CALLED TO, READ BACK BY AND VERIFIED WITH: A SEDANO RN 07/16/20 0043 JDW (NOTE) SARS-CoV-2 target nucleic acids are DETECTED  SARS-CoV-2 RNA is generally detectable in upper respiratory specimens  during the acute phase of infection.  Positive results are indicative  of the presence of the identified virus, but do not rule out bacterial infection or co-infection with other pathogens not detected by the test.  Clinical correlation with patient history and  other diagnostic information is necessary to determine patient infection status.  The expected result is negative.  Fact Sheet for Patients:   BoilerBrush.com.cy   Fact Sheet for Healthcare Providers:   https://pope.com/    This test is not yet approved or cleared by the [place] FDA and  has been authorized for detection and/or diagnosis of SARS-CoV-2 by FDA under an Emergency Use Authorization (EUA).  This EUA will remain in effect (meaning this test can  be used) for the duration of  the COVID-19 declaration under Section 564(b)(1) of the Act, 21 U.S.C. section 360-bbb-3(b)(1), unless the authorization is terminated or revoked sooner.  Performed at [facility], [address] [place], [place] [address]   Culture, beta strep (group b only)     Status: None   Collection Time: 07/15/20 11:26 PM   Specimen: Vaginal/Rectal; Genital  Result Value Ref Range Status   Specimen Description VAGINAL/RECTAL  Final   Special Requests NONE  Final   Culture   Final    NO GROUP B STREP (S.AGALACTIAE) ISOLATED Performed at [facility], [address], [place], [place] [address]    Report Status 07/18/2020 FINAL  Final     Medications:   . vitamin C  500 mg Oral Daily  . aspirin EC   81 mg Oral Daily  . docusate sodium  100 mg Oral BID  . enoxaparin (LOVENOX) injection  40 mg Subcutaneous Q24H  . guaiFENesin  600 mg Oral BID  . predniSONE  50 mg Oral Daily  . prenatal multivitamin  1 tablet Oral Q1200  . sertraline  25 mg Oral QHS  . zinc sulfate  220 mg Oral Daily   Continuous Infusions:     LOS: 4 days   Marinda Elk  [facility]  07/20/2020, 11:11 AM

## 2020-07-20 NOTE — Progress Notes (Signed)
Patient ID: Terry [name], female   DOB: 06-09-1996, 24 y.o.   MRN: [id number] FACULTY PRACTICE ANTEPARTUM(COMPREHENSIVE) NOTE  Schwanda Zima is a 24 y.o. G3P1011 at [redacted]w[redacted]d by early ultrasound who is admitted for COVID 19 pneumonia.   Fetal presentation is unsure. Length of Stay:  4  Days  Subjective: Patient has significant desaturation episode earlier this morning and needed 15L of oxygen by NRB for about 15 minutes. Rapid Response involved.  After her saturation was maintained, she was able to be weaned back to HFNC at 12L, has continued to be weaned and currently at 9L. Desaturates easily with any movement or slightest exertion. FHR remained reassuring throughout episode Patient reports the fetal movement as active. Patient reports uterine contraction  activity as none. Patient reports  vaginal bleeding as none. Patient describes fluid per vagina as none.  Vitals:  Blood pressure 107/62, pulse 85, temperature 97.7 F (36.5 C), temperature source Oral, resp. rate (!) 26, height 5\' 10"  (1.778 m), weight 88.9 kg, last menstrual period 11/09/2019, SpO2 100 %.   Physical Examination: General  - in mild distress and in bed Lungs - working to breathe  Heart - normal rate and regular rhythm Abdomen - soft, nontender, nondistended Fundal Height:  size equals dates Extremities: extremities normal, atraumatic, no cyanosis or edema and Homans sign is negative, no sign of DVT  Membranes:intact  Fetal Monitoring:   Fetal Heart Rate A  Mode External   Baseline Rate (A) 145 bpm  Variability 6-25 BPM  Accelerations Present  Decelerations None currently but has periodic variable decelerations, some lasting 1-2 minutes but returning to reassuring baseline  Tocometer: Rare uterine contractions (1-2 per hour)   Labs:  Results for orders placed or performed during the hospital encounter of 07/15/20 (from the past 24 hour(s))  D-dimer, quantitative (not at [facility])   Collection Time: 07/19/20   7:36 AM  Result Value Ref Range   D-Dimer, Quant 2.51 (H) 0.00 - 0.50 ug/mL-FEU     Medications:  Scheduled . vitamin C  500 mg Oral Daily  . aspirin EC  81 mg Oral Daily  . docusate sodium  100 mg Oral BID  . enoxaparin (LOVENOX) injection  40 mg Subcutaneous Q24H  . guaiFENesin  600 mg Oral BID  . predniSONE  50 mg Oral Daily  . prenatal multivitamin  1 tablet Oral Q1200  . sertraline  25 mg Oral QHS  . zinc sulfate  220 mg Oral Daily     I have reviewed the patient's current medications.  ASSESSMENT: Principal Problem:   Pneumonia due to COVID-19 virus Active Problems:   [redacted] weeks gestation of pregnancy   COVID-19 affecting pregnancy in third trimester  PLAN: Continue Oxygen therapy to maintain saturations.  Completed Remdesivir for 5 day course; continue steroids x 10 day course, Lovenox as ordered Discussed Actemra again with patient, she is willing to try this but wants to discuss further with [facility] physician.  Appreciate TRH recommendations Will continue to monitor FHT, overall reassuring for now. No signs/symptoms of labor, no current indications for delivery. Continue close observation.   [facility], MD, FACOG Obstetrician & Gynecologist, [facility] for [facility]

## 2020-07-21 ENCOUNTER — Encounter: Payer: Medicaid Other | Admitting: Nurse Practitioner

## 2020-07-21 LAB — CBC WITH DIFFERENTIAL/PLATELET
Abs Immature Granulocytes: 0.35 10*3/uL — ABNORMAL HIGH (ref 0.00–0.07)
Basophils Absolute: 0 10*3/uL (ref 0.0–0.1)
Basophils Relative: 0 %
Eosinophils Absolute: 0 10*3/uL (ref 0.0–0.5)
Eosinophils Relative: 0 %
HCT: 32.8 % — ABNORMAL LOW (ref 36.0–46.0)
Hemoglobin: 10.4 g/dL — ABNORMAL LOW (ref 12.0–15.0)
Immature Granulocytes: 3 %
Lymphocytes Relative: 11 %
Lymphs Abs: 1.2 10*3/uL (ref 0.7–4.0)
MCH: 30.6 pg (ref 26.0–34.0)
MCHC: 31.7 g/dL (ref 30.0–36.0)
MCV: 96.5 fL (ref 80.0–100.0)
Monocytes Absolute: 0.5 10*3/uL (ref 0.1–1.0)
Monocytes Relative: 5 %
Neutro Abs: 8.4 10*3/uL — ABNORMAL HIGH (ref 1.7–7.7)
Neutrophils Relative %: 81 %
Platelets: 250 10*3/uL (ref 150–400)
RBC: 3.4 MIL/uL — ABNORMAL LOW (ref 3.87–5.11)
RDW: 15.6 % — ABNORMAL HIGH (ref 11.5–15.5)
WBC: 10.4 10*3/uL (ref 4.0–10.5)
nRBC: 0.6 % — ABNORMAL HIGH (ref 0.0–0.2)

## 2020-07-21 LAB — COMPREHENSIVE METABOLIC PANEL
ALT: 17 U/L (ref 0–44)
AST: 28 U/L (ref 15–41)
Albumin: 1.9 g/dL — ABNORMAL LOW (ref 3.5–5.0)
Alkaline Phosphatase: 103 U/L (ref 38–126)
Anion gap: 9 (ref 5–15)
BUN: <5 mg/dL — ABNORMAL LOW (ref 6–20)
CO2: 22 mmol/L (ref 22–32)
Calcium: 8.2 mg/dL — ABNORMAL LOW (ref 8.9–10.3)
Chloride: 107 mmol/L (ref 98–111)
Creatinine, Ser: 0.68 mg/dL (ref 0.44–1.00)
GFR calc Af Amer: >60 mL/min (ref >60)
GFR calc non Af Amer: >60 mL/min (ref >60)
Glucose, Bld: 87 mg/dL (ref 70–99)
Potassium: 3.3 mmol/L — ABNORMAL LOW (ref 3.5–5.1)
Sodium: 138 mmol/L (ref 135–145)
Total Bilirubin: 0.6 mg/dL (ref 0.3–1.2)
Total Protein: 5.1 g/dL — ABNORMAL LOW (ref 6.5–8.1)

## 2020-07-21 LAB — FERRITIN: Ferritin: 25 ng/mL (ref 11–307)

## 2020-07-21 LAB — D-DIMER, QUANTITATIVE: D-Dimer, Quant: 2.95 ug/mL-FEU — ABNORMAL HIGH (ref 0.00–0.50)

## 2020-07-21 LAB — C-REACTIVE PROTEIN: CRP: 6.5 mg/dL — ABNORMAL HIGH (ref <1.0)

## 2020-07-21 LAB — MAGNESIUM: Magnesium: 1.7 mg/dL (ref 1.7–2.4)

## 2020-07-21 NOTE — Progress Notes (Signed)
FACULTY PRACTICE ANTEPARTUM PROGRESS NOTE  Terry [name] is a 24 y.o. G3P1011 at [redacted]w[redacted]d who is admitted for covid-19 pneumonia.  Estimated Date of Delivery: 08/15/20 Fetal presentation is unsure.  Length of Stay:  5 Days. Admitted 07/15/2020  Subjective: Pt seen this AM.  She currently is on 4 L of oxygen and feels relatively comfortable.  She still notes cough and congestion.  She becomes more short of breath when she has to get up and move.  Throughout the evening nursing has reported intermittent contractions q 6-7  Minutes.The pt does not feel all contractions.  Fetal tracing has been reassuring throughout the evening. Patient reports normal fetal movement.   She notes irregular uterine contractions, Pt denies leaking of fluid Pt denies vaginal bleeding  Vitals:  Blood pressure 115/60, pulse 76, temperature 98.1 F (36.7 C), temperature source Oral, resp. rate 16, height 5\' 10"  (1.778 m), weight 88.9 kg, last menstrual period 11/09/2019, SpO2 98 %. Physical Examination: CONSTITUTIONAL: Well-developed, well-nourished female in no acute distress.  HENT:  Normocephalic, atraumatic,  EYES: Conjunctivae and EOM are normal. Pupils are equal, round, and reactive to light.   NECK: Normal range of motion, supple, no masses. SKIN: Skin is warm and dry. No rash noted. Not diaphoretic. No erythema. No pallor. NEUROLGIC: Alert and oriented to person, place, and time. Normal reflexes, muscle tone coordination. No cranial nerve deficit noted. PSYCHIATRIC: Normal mood and affect. Normal behavior. Normal judgment and thought content. CARDIOVASCULAR: Normal heart rate noted, regular rhythm RESPIRATORY: slightly labored breathing, pt appears short of breath. occasional expiratory rales noted  MUSCULOSKELETAL: Normal range of motion. No edema and no tenderness. ABDOMEN: Soft, nontender, nondistended, gravid. CERVIX: deferred  Fetal monitoring: FHR: 140 bpm, Variability: moderate, Accelerations: Present,  Decelerations: Absent , Cat 1 tracing Uterine activity: 7-9 contractions per hour Membranes intact Results for orders placed or performed during the hospital encounter of 07/15/20 (from the past 48 hour(s))  D-dimer, quantitative (not at [facility])     Status: Abnormal   Collection Time: 07/19/20  7:36 AM  Result Value Ref Range   D-Dimer, Quant 2.51 (H) 0.00 - 0.50 ug/mL-FEU    Comment: (NOTE) At the manufacturer cut-off of 0.50 ug/mL FEU, this assay has been documented to exclude PE with a sensitivity and negative predictive value of 97 to 99%.  At this time, this assay has not been approved by the FDA to exclude DVT/VTE. Results should be correlated with clinical presentation. Performed at [facility], [address], [place], [place]   CBC with Differential/Platelet     Status: Abnormal   Collection Time: 07/20/20  7:41 AM  Result Value Ref Range   WBC 9.3 4.0 - 10.5 K/uL   RBC 3.36 (L) 3.87 - 5.11 MIL/uL   Hemoglobin 10.6 (L) 12.0 - 15.0 g/dL   HCT 09/19/20 (L) 36 - 46 %   MCV 97.6 80.0 - 100.0 fL   MCH 31.5 26.0 - 34.0 pg   MCHC 32.3 30.0 - 36.0 g/dL   RDW 50.0 (H) 93.8 - 18.2 %   Platelets 250 150 - 400 K/uL   nRBC 0.6 (H) 0.0 - 0.2 %   Neutrophils Relative % 78 %   Neutro Abs 7.3 1.7 - 7.7 K/uL   Lymphocytes Relative 12 %   Lymphs Abs 1.1 0.7 - 4.0 K/uL   Monocytes Relative 4 %   Monocytes Absolute 0.4 0 - 1 K/uL   Eosinophils Relative 0 %   Eosinophils Absolute 0.0 0 -  0 K/uL   Basophils Relative 1 %   Basophils Absolute 0.1 0 - 0 K/uL   Immature Granulocytes 5 %   Abs Immature Granulocytes 0.45 (H) 0.00 - 0.07 K/uL   Polychromasia PRESENT     Comment: Performed at [facility], [address], [place], [place] [address]  Comprehensive metabolic panel     Status: Abnormal   Collection Time: 07/20/20  7:41 AM  Result Value Ref Range   Sodium 137 135 - 145 mmol/L   Potassium 3.1 (L) 3.5 - 5.1 mmol/L   Chloride 107 98 - 111 mmol/L   CO2 21 (L) 22  - 32 mmol/L   Glucose, Bld 102 (H) 70 - 99 mg/dL    Comment: Glucose reference range applies only to samples taken after fasting for at least 8 hours.   BUN <5 (L) 6 - 20 mg/dL   Creatinine, Ser 5.69 0.44 - 1.00 mg/dL   Calcium 8.0 (L) 8.9 - 10.3 mg/dL   Total Protein 5.0 (L) 6.5 - 8.1 g/dL   Albumin 1.9 (L) 3.5 - 5.0 g/dL   AST 36 15 - 41 U/L   ALT 18 0 - 44 U/L   Alkaline Phosphatase 105 38 - 126 U/L   Total Bilirubin 0.5 0.3 - 1.2 mg/dL   GFR calc non Af Amer >60 >60 mL/min   GFR calc Af Amer >60 >60 mL/min   Anion gap 9 5 - 15    Comment: Performed at [facility], [address] [place], [place] [address]  C-reactive protein     Status: Abnormal   Collection Time: 07/20/20  7:41 AM  Result Value Ref Range   CRP 5.2 (H) <1.0 mg/dL    Comment: Performed at [facility], [address] [place], [place] [address]  D-dimer, quantitative (not at [facility])     Status: Abnormal   Collection Time: 07/20/20  7:41 AM  Result Value Ref Range   D-Dimer, Quant 3.08 (H) 0.00 - 0.50 ug/mL-FEU    Comment: (NOTE) At the manufacturer cut-off of 0.50 ug/mL FEU, this assay has been documented to exclude PE with a sensitivity and negative predictive value of 97 to 99%.  At this time, this assay has not been approved by the FDA to exclude DVT/VTE. Results should be correlated with clinical presentation. Performed at [facility], [address], [place], [place] [address]   Ferritin     Status: None   Collection Time: 07/20/20  7:41 AM  Result Value Ref Range   Ferritin 32 11 - 307 ng/mL    Comment: Performed at [facility], [address], [place], [place] [address]  Magnesium     Status: None   Collection Time: 07/20/20  7:41 AM  Result Value Ref Range   Magnesium 1.7 1.7 - 2.4 mg/dL    Comment: Performed at [facility], [address] [place], [place] [address]    I have reviewed the patient's current medications.  ASSESSMENT: Principal Problem:    Pneumonia due to COVID-19 virus Active Problems:   [redacted] weeks gestation of pregnancy   COVID-19 affecting pregnancy in third trimester   PLAN: Continue oxygen, will defer to medicine for continued weaning. Pt continues with prednisone Pt advised to discuss Actemra with medicine team today. Currently category strip with irregular contractions noted.  If contractions become more organized, advise holding lovenox in case of true labor. Appreciate medicine assistance   Continue  routine antenatal care.   Mariel Aloe, MD [facility] Attending, Center for [facility] 07/21/2020 6:38 AM

## 2020-07-21 NOTE — Progress Notes (Signed)
Nursing called with concerns of fetal tracing and contractions.  The contractions are irregular.  Category 1 strip noted upon review.  Baseline 130s.  Multiple accels noted.  Moderate to marked variability.  No recurrent decels.  Continue expectant management at this time.  Agree with continued supplemental oxygen.

## 2020-07-22 MED ORDER — MENTHOL 3 MG MT LOZG
1.0000 | LOZENGE | OROMUCOSAL | Status: DC | PRN
  Administered 2020-07-22: 3 mg via ORAL
  Filled 2020-07-22: qty 9

## 2020-07-22 NOTE — Progress Notes (Signed)
Encouraged pt to ambulate to chair or sit up on side of bed to improve breathing and oxygen saturation per discussion with MD.  Pt refused to get up to chair and stated that she "just wanted to take a shower and go home".  Offered bedside basin and washcloths, pt refused that as well. Discussed with patient that getting up to chair and moving around will help with recovery.

## 2020-07-22 NOTE — Progress Notes (Signed)
Patient ID: Terry [name], female   DOB: 02/25/1996, 24 y.o.   MRN: [id number] FACULTY PRACTICE ANTEPARTUM(COMPREHENSIVE) NOTE  Terry [name] is a 24 y.o. G3P1011 at [redacted]w[redacted]d by best clinical estimate who is admitted for Covid pneumonia.   Fetal presentation is cephalic. Length of Stay:  6  Days  Subjective: Still with cough and intermittent SOB Patient reports the fetal movement as active. Patient reports uterine contraction  activity as none. Patient reports  vaginal bleeding as none. Patient describes fluid per vagina as None.  Vitals:  Blood pressure (!) 108/59, pulse 70, temperature 98.2 F (36.8 C), temperature source Oral, resp. rate 18, height 5\' 10"  (1.778 m), weight 88.9 kg, last menstrual period 11/09/2019, SpO2 93 %. Physical Examination:  General appearance - in mild to moderate distress Heart - normal rate and regular rhythm Abdomen - soft, nontender, nondistended Fundal Height:  size equals dates Cervical Exam: Not evaluated.  Extremities: extremities normal, atraumatic, no cyanosis or edema and Homans sign is negative, no sign of DVT with DTRs 2+ bilaterally Membranes:intact  Fetal Monitoring:  Fetal Heart Rate A  Mode External filed at 07/22/2020 0700  Baseline Rate (A) 150 bpm filed at 07/22/2020 0700  Variability 6-25 BPM filed at 07/22/2020 0700  Accelerations 15 x 15 filed at 07/22/2020 0700  Decelerations Variable filed at 07/22/2020 0700     Labs:  No results found for this or any previous visit (from the past 24 hour(s)).   Medications:  Scheduled  vitamin C  500 mg Oral Daily   aspirin EC  81 mg Oral Daily   docusate sodium  100 mg Oral BID   enoxaparin (LOVENOX) injection  40 mg Subcutaneous Q24H   guaiFENesin  600 mg Oral BID   predniSONE  70 mg Oral Daily   prenatal multivitamin  1 tablet Oral Q1200   sertraline  25 mg Oral QHS   zinc sulfate  220 mg Oral Daily   I have reviewed the patient's current  medications.  ASSESSMENT: Patient Active Problem List   Diagnosis Date Noted   [redacted] weeks gestation of pregnancy 07/18/2020   COVID-19 affecting pregnancy in third trimester 07/18/2020   Pneumonia due to COVID-19 virus 07/16/2020   Round ligament pain 06/09/2020   History of prior pregnancy with short cervix, currently pregnant 02/25/2020   Depression affecting pregnancy 01/29/2020   Supervision of other normal pregnancy, antepartum 01/18/2020    PLAN: Very slow progress and unable to further wean O2. Will continue to follow with hospitalist service  03/19/2020 07/22/2020,10:22 AM

## 2020-07-22 NOTE — Progress Notes (Addendum)
Encouraged pt to ambulate in room and sit in the chair. RN reinforced to pt that this is one of the interventions used to help her get better. Pt refused and stated that she "just wanted to sleep."

## 2020-07-22 NOTE — Plan of Care (Signed)
  Problem: Education: Goal: Knowledge of risk factors and measures for prevention of condition will improve Outcome: Progressing   Problem: Coping: Goal: Psychosocial and spiritual needs will be supported Outcome: Progressing   Problem: Respiratory: Goal: Will maintain a patent airway Outcome: Progressing Goal: Complications related to the disease process, condition or treatment will be avoided or minimized Outcome: Progressing   Problem: Education: Goal: Knowledge of disease or condition will improve Outcome: Progressing Goal: Knowledge of the prescribed therapeutic regimen will improve Outcome: Progressing Goal: Individualized Educational Video(s) Outcome: Progressing   Problem: Clinical Measurements: Goal: Complications related to the disease process, condition or treatment will be avoided or minimized Outcome: Progressing   Problem: Education: Goal: Knowledge of General Education information will improve Description: Including pain rating scale, medication(s)/side effects and non-pharmacologic comfort measures Outcome: Progressing   Problem: Health Behavior/Discharge Planning: Goal: Ability to manage health-related needs will improve Outcome: Progressing   Problem: Clinical Measurements: Goal: Ability to maintain clinical measurements within normal limits will improve Outcome: Progressing Goal: Will remain free from infection Outcome: Progressing Goal: Diagnostic test results will improve Outcome: Progressing Goal: Respiratory complications will improve Outcome: Progressing Goal: Cardiovascular complication will be avoided Outcome: Progressing   Problem: Activity: Goal: Risk for activity intolerance will decrease Outcome: Progressing   Problem: Nutrition: Goal: Adequate nutrition will be maintained Outcome: Progressing   Problem: Coping: Goal: Level of anxiety will decrease Outcome: Progressing   Problem: Elimination: Goal: Will not experience  complications related to bowel motility Outcome: Progressing Goal: Will not experience complications related to urinary retention Outcome: Progressing   Problem: Pain Managment: Goal: General experience of comfort will improve Outcome: Progressing   Problem: Safety: Goal: Ability to remain free from injury will improve Outcome: Progressing   Problem: Skin Integrity: Goal: Risk for impaired skin integrity will decrease Outcome: Progressing

## 2020-07-22 NOTE — Progress Notes (Signed)
[facility] CONSULTPROGRESS NOTE    Progress Note  Terry [name]  CZY:[id number] DOB: 09/02/1996 DOA: 07/15/2020 PCP: Inc, [facility]     Brief Narrative:   Terry [name] is an 24 y.o. female 35 weeks and 4 days pregnant who came in with shortness of breath nausea vomiting and body aches was found to be febrile in the ED satting 90% on room air tachypneic and tachycardic with a blood pressure of 84/55.  Assessment/Plan:   Acute resp failure with Hypoxia Pneumonia due to COVID-19 virus: Today she is down to 4 L of oxygen and his saturations remained greater than 90%. She continues to desat with ambulation her same amount. Continue to encourage incentive spirometry and flutter valve we will continue supportive care.  She has been a hard case to wean, there is not uncommon. She has completed her course of treatment. It is very important that she tries to get up to the chair and use incentive spirometry as much as possible try to stay off her back in the bed is also most. Her inflammatory markers are trending down we will stop checking them. She has remained afebrile with not appear to have any other signs of infection either bacterial pneumonia. You have her DVT prophylaxis: lovenox Family Communication:none Status is: Inpatient  Remains inpatient appropriate because:Hemodynamically unstable   Dispo: The patient is from: Home              Anticipated d/c is to: Home              Anticipated d/c date is: > 3 days              Patient currently is not medically stable to d/c.  Unable to determine.  Code Status:     Code Status Orders  (From admission, onward)           Start     Ordered   07/16/20 0233  Full code  Continuous        07/16/20 0245           Code Status History     Date Active Date Inactive Code Status Order ID Comments User Context   12/28/2016 0647 12/30/2016 1751 Full Code [id number]  Donette Larry, CNM Inpatient     12/27/2016 2013 12/28/2016 0647 Full Code [id number]  Marylene Land, CNM Inpatient   Advance Care Planning Activity         IV Access:    Peripheral IV   Procedures and diagnostic studies:   No results found.   Medical Consultants:    None.  Anti-Infectives:   remdesivir  Subjective:    Terry [name] no changes, no complains  Objective:    Vitals:   07/22/20 0700 07/22/20 0753 07/22/20 0800 07/22/20 0900  BP:  (!) 108/59    Pulse:  70    Resp:  18    Temp:  98.2 F (36.8 C)    TempSrc:  Oral    SpO2: 95%  99% 93%  Weight:      Height:       SpO2: 93 % O2 Flow Rate (L/min): 4 L/min (per Dr. Debroah Loop request)   Intake/Output Summary (Last 24 hours) at 07/22/2020 1031 Last data filed at 07/22/2020 0523 Gross per 24 hour  Intake --  Output 1150 ml  Net -1150 ml   Filed Weights   07/16/20 0545  Weight: 88.9 kg    Exam: General exam: In no acute distress.  Respiratory system: Good air movement and clear to auscultation. Cardiovascular system: S1 & S2 heard, RRR. No JVD. Gastrointestinal system: Abdomen is nondistended, soft and nontender.  Extremities: No pedal edema. Skin: No rashes, lesions or ulcers  Data Reviewed:    Labs: Basic Metabolic Panel: Recent Labs  Lab 07/17/20 0556 07/17/20 0556 07/18/20 0621 07/18/20 0621 07/19/20 0506 07/19/20 0506 07/20/20 0741 07/21/20 0609  NA 141  --  137  --  137  --  137 138  K 4.0   < > 3.5   < > 3.2*   < > 3.1* 3.3*  CL 112*  --  108  --  107  --  107 107  CO2 20*  --  21*  --  20*  --  21* 22  GLUCOSE 125*  --  110*  --  107*  --  102* 87  BUN 5*  --  <5*  --  <5*  --  <5* <5*  CREATININE 0.61  --  0.61  --  0.68  --  0.73 0.68  CALCIUM 8.4*  --  7.9*  --  7.9*  --  8.0* 8.2*  MG 1.7  --  1.5*  --  1.7  --  1.7 1.7   < > = values in this interval not displayed.   GFR Estimated Creatinine Clearance: 129 mL/min (by C-G formula based on SCr of 0.68 mg/dL). Liver Function  Tests: Recent Labs  Lab 07/17/20 0556 07/18/20 0621 07/19/20 0506 07/20/20 0741 07/21/20 0609  AST 23 29 36 36 28  ALT 10 13 16 18 17   ALKPHOS 82 82 97 105 103  BILITOT 0.6 0.6 0.4 0.5 0.6  PROT 5.1* 5.1* 5.0* 5.0* 5.1*  ALBUMIN 2.0* 2.0* 2.0* 1.9* 1.9*   No results for input(s): LIPASE, AMYLASE in the last 168 hours. No results for input(s): AMMONIA in the last 168 hours. Coagulation profile No results for input(s): INR, PROTIME in the last 168 hours. COVID-19 Labs  Recent Labs    07/20/20 0741 07/21/20 0609  DDIMER 3.08* 2.95*  FERRITIN 32 25  CRP 5.2* 6.5*    Lab Results  Component Value Date   SARSCOV2NAA POSITIVE (A) 07/15/2020    CBC: Recent Labs  Lab 07/17/20 0556 07/18/20 0621 07/19/20 0506 07/20/20 0741 07/21/20 0609  WBC 11.2* 12.1* 8.5 9.3 10.4  NEUTROABS 9.4* 10.4* 7.4 7.3 8.4*  HGB 10.7* 9.9* 10.5* 10.6* 10.4*  HCT 32.8* 31.0* 32.7* 32.8* 32.8*  MCV 97.3 97.8 98.8 97.6 96.5  PLT 217 219 239 250 250   Cardiac Enzymes: No results for input(s): CKTOTAL, CKMB, CKMBINDEX, TROPONINI in the last 168 hours. BNP (last 3 results) No results for input(s): PROBNP in the last 8760 hours. CBG: No results for input(s): GLUCAP in the last 168 hours. D-Dimer: Recent Labs    07/20/20 0741 07/21/20 0609  DDIMER 3.08* 2.95*   Hgb A1c: No results for input(s): HGBA1C in the last 72 hours. Lipid Profile: No results for input(s): CHOL, HDL, LDLCALC, TRIG, CHOLHDL, LDLDIRECT in the last 72 hours. Thyroid function studies: No results for input(s): TSH, T4TOTAL, T3FREE, THYROIDAB in the last 72 hours.  Invalid input(s): FREET3 Anemia work up: Recent Labs    07/20/20 0741 07/21/20 0609  FERRITIN 32 25   Sepsis Labs: Recent Labs  Lab 07/15/20 2320 07/17/20 0556 07/18/20 0621 07/19/20 0506 07/20/20 0741 07/21/20 0609  PROCALCITON <0.10  --   --   --   --   --   WBC  9.9   < > 12.1* 8.5 9.3 10.4   < > = values in this interval not displayed.    Microbiology Recent Results (from the past 240 hour(s))  SARS Coronavirus 2 by RT PCR (hospital order, performed in [facility] hospital lab) Nasopharyngeal Nasopharyngeal Swab     Status: Abnormal   Collection Time: 07/15/20 11:26 PM   Specimen: Nasopharyngeal Swab  Result Value Ref Range Status   SARS Coronavirus 2 POSITIVE (A) NEGATIVE Final    Comment: RESULT CALLED TO, READ BACK BY AND VERIFIED WITH: A SEDANO RN 07/16/20 0043 JDW (NOTE) SARS-CoV-2 target nucleic acids are DETECTED  SARS-CoV-2 RNA is generally detectable in upper respiratory specimens  during the acute phase of infection.  Positive results are indicative  of the presence of the identified virus, but do not rule out bacterial infection or co-infection with other pathogens not detected by the test.  Clinical correlation with patient history and  other diagnostic information is necessary to determine patient infection status.  The expected result is negative.  Fact Sheet for Patients:   BoilerBrush.com.cy   Fact Sheet for Healthcare Providers:   https://pope.com/    This test is not yet approved or cleared by the [place] FDA and  has been authorized for detection and/or diagnosis of SARS-CoV-2 by FDA under an Emergency Use Authorization (EUA).  This EUA will remain in effect (meaning this test can  be used) for the duration of  the COVID-19 declaration under Section 564(b)(1) of the Act, 21 U.S.C. section 360-bbb-3(b)(1), unless the authorization is terminated or revoked sooner.  Performed at [facility], [address] [place], [place] [address]   Culture, beta strep (group b only)     Status: None   Collection Time: 07/15/20 11:26 PM   Specimen: Vaginal/Rectal; Genital  Result Value Ref Range Status   Specimen Description VAGINAL/RECTAL  Final   Special Requests NONE  Final   Culture   Final    NO GROUP B STREP (S.AGALACTIAE)  ISOLATED Performed at [facility], [address] [place], [place] [address]    Report Status 07/18/2020 FINAL  Final     Medications:   . vitamin C  500 mg Oral Daily  . aspirin EC  81 mg Oral Daily  . docusate sodium  100 mg Oral BID  . enoxaparin (LOVENOX) injection  40 mg Subcutaneous Q24H  . guaiFENesin  600 mg Oral BID  . predniSONE  70 mg Oral Daily  . prenatal multivitamin  1 tablet Oral Q1200  . sertraline  25 mg Oral QHS  . zinc sulfate  220 mg Oral Daily   Continuous Infusions:     LOS: 6 days   Marinda Elk  [facility]  07/22/2020, 10:31 AM

## 2020-07-23 LAB — OB RESULTS CONSOLE GBS: GBS: NEGATIVE

## 2020-07-23 NOTE — Progress Notes (Signed)
[facility] CONSULTPROGRESS NOTE    Progress Note  Terry [name]  MLJ:[id number] DOB: 05-29-1996 DOA: 07/15/2020 PCP: Inc, [facility]     Brief Narrative:   Terry [name] is an 24 y.o. female 35 weeks and 4 days pregnant who came in with shortness of breath nausea vomiting and body aches was found to be febrile in the ED satting 90% on room air tachypneic and tachycardic with a blood pressure of 84/55.  Assessment/Plan:   Acute resp failure with Hypoxia Pneumonia due to COVID-19 virus: This morning she is only requiring 1 L of oxygen to keep saturations greater than 92%. I will go ahead and check saturations with ambulation when she has been weaned to room air. I will still encourage her to continue to use incentive spirometry and flutter valve. It is still very important and if she tries to get up to the chair and out of the bed and ambulate as much as possible. She has remained afebrile does not appear in any distress, she does not have a cough or signs of infection.  DVT prophylaxis: lovenox Family Communication:none Status is: Inpatient  Remains inpatient appropriate because:Hemodynamically unstable   Dispo: The patient is from: Home              Anticipated d/c is to: Home              Anticipated d/c date is: > 3 days              Patient currently is not medically stable to d/c.  Unable to determine.  Code Status:     Code Status Orders  (From admission, onward)           Start     Ordered   07/16/20 0233  Full code  Continuous        07/16/20 0245           Code Status History     Date Active Date Inactive Code Status Order ID Comments User Context   12/28/2016 0647 12/30/2016 1751 Full Code [id number]  Donette Larry, CNM Inpatient   12/27/2016 2013 12/28/2016 0647 Full Code [id number]  Marylene Land, CNM Inpatient   Advance Care Planning Activity         IV Access:    Peripheral IV   Procedures  and diagnostic studies:   No results found.   Medical Consultants:    None.  Anti-Infectives:   remdesivir  Subjective:    Terry [name] she relates her breathing is better compared to yesterday.  Objective:    Vitals:   07/23/20 0938 07/23/20 0941 07/23/20 1040 07/23/20 1047  BP:      Pulse:      Resp:      Temp:      TempSrc:      SpO2: 97% 95% 97% 97%  Weight:      Height:       SpO2: 97 % O2 Flow Rate (L/min): 1 L/min   Intake/Output Summary (Last 24 hours) at 07/23/2020 1053 Last data filed at 07/22/2020 1700 Gross per 24 hour  Intake --  Output 500 ml  Net -500 ml   Filed Weights   07/16/20 0545  Weight: 88.9 kg    Exam: General exam: In no acute distress. Respiratory system: Good air movement and clear to auscultation. Cardiovascular system: S1 & S2 heard, RRR. No JVD. Gastrointestinal system: Abdomen is nondistended, soft and nontender.  Extremities: No pedal edema. Skin:  No rashes, lesions or ulcers  Data Reviewed:    Labs: Basic Metabolic Panel: Recent Labs  Lab 07/17/20 0556 07/17/20 0556 07/18/20 0621 07/18/20 0621 07/19/20 0506 07/19/20 0506 07/20/20 0741 07/21/20 0609  NA 141  --  137  --  137  --  137 138  K 4.0   < > 3.5   < > 3.2*   < > 3.1* 3.3*  CL 112*  --  108  --  107  --  107 107  CO2 20*  --  21*  --  20*  --  21* 22  GLUCOSE 125*  --  110*  --  107*  --  102* 87  BUN 5*  --  <5*  --  <5*  --  <5* <5*  CREATININE 0.61  --  0.61  --  0.68  --  0.73 0.68  CALCIUM 8.4*  --  7.9*  --  7.9*  --  8.0* 8.2*  MG 1.7  --  1.5*  --  1.7  --  1.7 1.7   < > = values in this interval not displayed.   GFR Estimated Creatinine Clearance: 129 mL/min (by C-G formula based on SCr of 0.68 mg/dL). Liver Function Tests: Recent Labs  Lab 07/17/20 0556 07/18/20 0621 07/19/20 0506 07/20/20 0741 07/21/20 0609  AST 23 29 36 36 28  ALT 10 13 16 18 17   ALKPHOS 82 82 97 105 103  BILITOT 0.6 0.6 0.4 0.5 0.6  PROT 5.1* 5.1*  5.0* 5.0* 5.1*  ALBUMIN 2.0* 2.0* 2.0* 1.9* 1.9*   No results for input(s): LIPASE, AMYLASE in the last 168 hours. No results for input(s): AMMONIA in the last 168 hours. Coagulation profile No results for input(s): INR, PROTIME in the last 168 hours. COVID-19 Labs  Recent Labs    07/21/20 0609  DDIMER 2.95*  FERRITIN 25  CRP 6.5*    Lab Results  Component Value Date   SARSCOV2NAA POSITIVE (A) 07/15/2020    CBC: Recent Labs  Lab 07/17/20 0556 07/18/20 0621 07/19/20 0506 07/20/20 0741 07/21/20 0609  WBC 11.2* 12.1* 8.5 9.3 10.4  NEUTROABS 9.4* 10.4* 7.4 7.3 8.4*  HGB 10.7* 9.9* 10.5* 10.6* 10.4*  HCT 32.8* 31.0* 32.7* 32.8* 32.8*  MCV 97.3 97.8 98.8 97.6 96.5  PLT 217 219 239 250 250   Cardiac Enzymes: No results for input(s): CKTOTAL, CKMB, CKMBINDEX, TROPONINI in the last 168 hours. BNP (last 3 results) No results for input(s): PROBNP in the last 8760 hours. CBG: No results for input(s): GLUCAP in the last 168 hours. D-Dimer: Recent Labs    07/21/20 0609  DDIMER 2.95*   Hgb A1c: No results for input(s): HGBA1C in the last 72 hours. Lipid Profile: No results for input(s): CHOL, HDL, LDLCALC, TRIG, CHOLHDL, LDLDIRECT in the last 72 hours. Thyroid function studies: No results for input(s): TSH, T4TOTAL, T3FREE, THYROIDAB in the last 72 hours.  Invalid input(s): FREET3 Anemia work up: Recent Labs    07/21/20 0609  FERRITIN 25   Sepsis Labs: Recent Labs  Lab 07/18/20 0621 07/19/20 0506 07/20/20 0741 07/21/20 0609  WBC 12.1* 8.5 9.3 10.4   Microbiology Recent Results (from the past 240 hour(s))  SARS Coronavirus 2 by RT PCR (hospital order, performed in [facility] hospital lab) Nasopharyngeal Nasopharyngeal Swab     Status: Abnormal   Collection Time: 07/15/20 11:26 PM   Specimen: Nasopharyngeal Swab  Result Value Ref Range Status   SARS Coronavirus 2 POSITIVE (A) NEGATIVE Final  Comment: RESULT CALLED TO, READ BACK BY AND VERIFIED WITH: A  SEDANO RN 07/16/20 0043 JDW (NOTE) SARS-CoV-2 target nucleic acids are DETECTED  SARS-CoV-2 RNA is generally detectable in upper respiratory specimens  during the acute phase of infection.  Positive results are indicative  of the presence of the identified virus, but do not rule out bacterial infection or co-infection with other pathogens not detected by the test.  Clinical correlation with patient history and  other diagnostic information is necessary to determine patient infection status.  The expected result is negative.  Fact Sheet for Patients:   BoilerBrush.com.cy   Fact Sheet for Healthcare Providers:   https://pope.com/    This test is not yet approved or cleared by the [place] FDA and  has been authorized for detection and/or diagnosis of SARS-CoV-2 by FDA under an Emergency Use Authorization (EUA).  This EUA will remain in effect (meaning this test can  be used) for the duration of  the COVID-19 declaration under Section 564(b)(1) of the Act, 21 U.S.C. section 360-bbb-3(b)(1), unless the authorization is terminated or revoked sooner.  Performed at [facility], [address], [place], [place] [address]   Culture, beta strep (group b only)     Status: None   Collection Time: 07/15/20 11:26 PM   Specimen: Vaginal/Rectal; Genital  Result Value Ref Range Status   Specimen Description VAGINAL/RECTAL  Final   Special Requests NONE  Final   Culture   Final    NO GROUP B STREP (S.AGALACTIAE) ISOLATED Performed at [facility], [address], [place], [place] [address]    Report Status 07/18/2020 FINAL  Final     Medications:   . vitamin C  500 mg Oral Daily  . aspirin EC  81 mg Oral Daily  . docusate sodium  100 mg Oral BID  . enoxaparin (LOVENOX) injection  40 mg Subcutaneous Q24H  . guaiFENesin  600 mg Oral BID  . predniSONE  70 mg Oral Daily  . prenatal multivitamin  1 tablet Oral Q1200  .  sertraline  25 mg Oral QHS  . zinc sulfate  220 mg Oral Daily   Continuous Infusions:     LOS: 7 days   Marinda Elk  [facility]  07/23/2020, 10:53 AM

## 2020-07-23 NOTE — Progress Notes (Signed)
Initial Nutrition Assessment  DOCUMENTATION CODES:   Not applicable  INTERVENTION:  Regular Diet Pt may order double protein portions and snacks TID if she makes request when ordering meals   NUTRITION DIAGNOSIS:  Increased nutrient needs related to  (pregnancy and fetal growth requirements) as evidenced by  (36 weeks IUP).  GOAL:  Patient will meet greater than or equal to 90% of their needs  MONITOR:  Weight trends  REASON FOR ASSESSMENT:  Antenatal, LOS   ASSESSMENT:  36 5/7 weeks, adm with COVID pnuemonia  wt at initial prenatal visit ( 11 weeks ) 77.7 kg, BMI 24.5. 25 lbs net weight gain to date  PNV, zinc, Vitamin C supps   Diet Order:   Diet Order            Diet regular Room service appropriate? Yes; Fluid consistency: Thin  Diet effective now                EDUCATION NEEDS:   No education needs have been identified at this time  Skin:  Skin Assessment: Reviewed RN Assessment  Height:   Ht Readings from Last 1 Encounters:  07/16/20 5\' 10"  (1.778 m)    Weight:   Wt Readings from Last 1 Encounters:  07/16/20 88.9 kg    Ideal Body Weight:   150 lbs  BMI:  Body mass index is 28.12 kg/m.  Estimated Nutritional Needs:   Kcal:  2100-2300  Protein:  95-105 g  Fluid:  2.4 L    09/15/20 M.M LDN Neonatal Nutrition Support Specialist/RD III

## 2020-07-23 NOTE — Progress Notes (Signed)
Patient ID: Terry [name], female   DOB: 1996-07-26, 24 y.o.   MRN: [id number] FACULTY PRACTICE ANTEPARTUM(COMPREHENSIVE) NOTE  Terry [name] is a 24 y.o. G3P1011 at [redacted]w[redacted]d by early ultrasound who is admitted for COVID 19 pneumonia.   Fetal presentation is unsure. Length of Stay:  7  Days  Subjective: Patient reports congestion and cough but overall feels better. On 2L of oxygen HFNC. Patient reports the fetal movement as active. Patient reports uterine contraction  activity as none. Patient reports  vaginal bleeding as none. Patient describes fluid per vagina as none.  Vitals:  Blood pressure 108/68, pulse 69, temperature 98.7 F (37.1 C), temperature source Oral, resp. rate 20, height 5\' 10"  (1.778 m), weight 88.9 kg, last menstrual period 11/09/2019, SpO2 99 %.   Physical Examination: General  - in no distress and in bed Lungs - normal breathing noted Heart - normal rate and regular rhythm Abdomen - soft, nontender, nondistended Fundal Height:  size equals dates Extremities: extremities normal, atraumatic, no cyanosis or edema and Homans sign is negative, no sign of DVT  Membranes:intact  Fetal Monitoring:   Fetal Heart Rate A  Mode External   Baseline Rate (A) 135 bpm  Variability 6-25 BPM  Accelerations Present  Decelerations None currently but has periodic quick variable decelerations, some lasting 1-2 minutes but returning to reassuring baseline  Tocometer: Rare uterine contractions (1-2 per hour)   Labs:  No results found for this or any previous visit (from the past 24 hour(s)).   Medications:  Scheduled . vitamin C  500 mg Oral Daily  . aspirin EC  81 mg Oral Daily  . docusate sodium  100 mg Oral BID  . enoxaparin (LOVENOX) injection  40 mg Subcutaneous Q24H  . guaiFENesin  600 mg Oral BID  . predniSONE  70 mg Oral Daily  . prenatal multivitamin  1 tablet Oral Q1200  . sertraline  25 mg Oral QHS  . zinc sulfate  220 mg Oral Daily     I have reviewed the  patient's current medications.  ASSESSMENT: Principal Problem:   Pneumonia due to COVID-19 virus Active Problems:   [redacted] weeks gestation of pregnancy   COVID-19 affecting pregnancy in third trimester  PLAN: Continue Oxygen therapy to maintain saturations, wean as tolerated.   Completed Remdesivir for 5 day course; continue steroids x 10 day course, Lovenox as ordered for VTE prophylaxis. Appreciate TRH recommendations and co-management. Will continue to monitor FHT, overall reassuring for now. No signs/symptoms of labor, no current indications for delivery. Pelvic cultures ordered (GBS, GC/Chlam), will be done later today. Continue close observation.   01/07/2020, MD, FACOG Obstetrician & Gynecologist, [facility] for [facility]

## 2020-07-24 LAB — GC/CHLAMYDIA PROBE AMP (~~LOC~~) NOT AT ARMC
Chlamydia: NEGATIVE
Comment: NEGATIVE
Comment: NORMAL
Neisseria Gonorrhea: NEGATIVE

## 2020-07-24 MED ORDER — INFLUENZA VAC SPLIT QUAD 0.5 ML IM SUSY: 0.5000 mL | PREFILLED_SYRINGE | INTRAMUSCULAR | Status: DC

## 2020-07-24 NOTE — Progress Notes (Signed)
While RN ambulating, pt O2 sat stayed between 96 and 100 on RA. Pt states still feeling ShoB and lightheaded after a few minutes.

## 2020-07-24 NOTE — Progress Notes (Addendum)
[facility] CONSULTPROGRESS NOTE    Progress Note  Terry [name]  JEH:[id number] DOB: 27-Jul-1996 DOA: 07/15/2020 PCP: [facility]     Brief Narrative:   Terry [name] is an 24 y.o. female 35 weeks and 4 days pregnant who came in with shortness of breath nausea vomiting and body aches was found to be febrile in the ED satting 90% on room air tachypneic and tachycardic with a blood pressure of 84/55.  Assessment/Plan:   Acute resp failure with Hypoxia Pneumonia due to COVID-19 virus: She has been wean to room air this am. She relates her breathing feels better. She need to cont Incentive spirometry and OOB to chair. She is stable for d/c in 12-24 hrs. We will go ahead and stress her respiratory system, ambulate and check saturations. She needs to stay on isolation for 21 days start date is 07/15/2020. We will sign off.  DVT prophylaxis: lovenox Family Communication:none Status is: Inpatient  Remains inpatient appropriate because:Hemodynamically unstable   Dispo: The patient is from: Home              Anticipated d/c is to: Home              Anticipated d/c date is: > 3 days              Patient currently is not medically stable to d/c.  Unable to determine.  Code Status:     Code Status Orders  (From admission, onward)           Start     Ordered   07/16/20 0233  Full code  Continuous        07/16/20 0245           Code Status History     Date Active Date Inactive Code Status Order ID Comments User Context   12/28/2016 0647 12/30/2016 1751 Full Code [id number]  Donette Larry, CNM Inpatient   12/27/2016 2013 12/28/2016 0647 Full Code [id number]  Marylene Land, CNM Inpatient   Advance Care Planning Activity         IV Access:    Peripheral IV   Procedures and diagnostic studies:   No results found.   Medical Consultants:    None.  Anti-Infectives:   remdesivir  Subjective:    Terry [name] no complains  Objective:    Vitals:   07/24/20 0000 07/24/20 0100 07/24/20 0200 07/24/20 0607  BP:   110/60 (!) 105/50  Pulse:   77 76  Resp:   20 20  Temp:   98 F (36.7 C) 98.1 F (36.7 C)  TempSrc:      SpO2: 96% 90% 95% 94%  Weight:      Height:       SpO2: 94 % O2 Flow Rate (L/min): 2 L/min  No intake or output data in the 24 hours ending 07/24/20 1020 Filed Weights   07/16/20 0545  Weight: 88.9 kg    Exam: General exam: In no acute distress. Respiratory system: Good air movement and clear to auscultation. Cardiovascular system: S1 & S2 heard, RRR. No JVD. Gastrointestinal system: Abdomen is nondistended, soft and nontender.  Extremities: No pedal edema. Skin: No rashes, lesions or ulcers  Data Reviewed:    Labs: Basic Metabolic Panel: Recent Labs  Lab 07/18/20 0621 07/18/20 0621 07/19/20 0506 07/19/20 0506 07/20/20 0741 07/21/20 0609  NA 137  --  137  --  137 138  K 3.5   < >  3.2*   < > 3.1* 3.3*  CL 108  --  107  --  107 107  CO2 21*  --  20*  --  21* 22  GLUCOSE 110*  --  107*  --  102* 87  BUN <5*  --  <5*  --  <5* <5*  CREATININE 0.61  --  0.68  --  0.73 0.68  CALCIUM 7.9*  --  7.9*  --  8.0* 8.2*  MG 1.5*  --  1.7  --  1.7 1.7   < > = values in this interval not displayed.   GFR Estimated Creatinine Clearance: 129 mL/min (by C-G formula based on SCr of 0.68 mg/dL). Liver Function Tests: Recent Labs  Lab 07/18/20 0621 07/19/20 0506 07/20/20 0741 07/21/20 0609  AST 29 36 36 28  ALT 13 16 18 17   ALKPHOS 82 97 105 103  BILITOT 0.6 0.4 0.5 0.6  PROT 5.1* 5.0* 5.0* 5.1*  ALBUMIN 2.0* 2.0* 1.9* 1.9*   No results for input(s): LIPASE, AMYLASE in the last 168 hours. No results for input(s): AMMONIA in the last 168 hours. Coagulation profile No results for input(s): INR, PROTIME in the last 168 hours. COVID-19 Labs  No results for input(s): DDIMER, FERRITIN, LDH, CRP in the last 72 hours.  Lab Results  Component Value Date    SARSCOV2NAA POSITIVE (A) 07/15/2020    CBC: Recent Labs  Lab 07/18/20 0621 07/19/20 0506 07/20/20 0741 07/21/20 0609  WBC 12.1* 8.5 9.3 10.4  NEUTROABS 10.4* 7.4 7.3 8.4*  HGB 9.9* 10.5* 10.6* 10.4*  HCT 31.0* 32.7* 32.8* 32.8*  MCV 97.8 98.8 97.6 96.5  PLT 219 239 250 250   Cardiac Enzymes: No results for input(s): CKTOTAL, CKMB, CKMBINDEX, TROPONINI in the last 168 hours. BNP (last 3 results) No results for input(s): PROBNP in the last 8760 hours. CBG: No results for input(s): GLUCAP in the last 168 hours. D-Dimer: No results for input(s): DDIMER in the last 72 hours. Hgb A1c: No results for input(s): HGBA1C in the last 72 hours. Lipid Profile: No results for input(s): CHOL, HDL, LDLCALC, TRIG, CHOLHDL, LDLDIRECT in the last 72 hours. Thyroid function studies: No results for input(s): TSH, T4TOTAL, T3FREE, THYROIDAB in the last 72 hours.  Invalid input(s): FREET3 Anemia work up: No results for input(s): VITAMINB12, FOLATE, FERRITIN, TIBC, IRON, RETICCTPCT in the last 72 hours. Sepsis Labs: Recent Labs  Lab 07/18/20 0621 07/19/20 0506 07/20/20 0741 07/21/20 0609  WBC 12.1* 8.5 9.3 10.4   Microbiology Recent Results (from the past 240 hour(s))  SARS Coronavirus 2 by RT PCR (hospital order, performed in [facility] hospital lab) Nasopharyngeal Nasopharyngeal Swab     Status: Abnormal   Collection Time: 07/15/20 11:26 PM   Specimen: Nasopharyngeal Swab  Result Value Ref Range Status   SARS Coronavirus 2 POSITIVE (A) NEGATIVE Final    Comment: RESULT CALLED TO, READ BACK BY AND VERIFIED WITH: A SEDANO RN 07/16/20 0043 JDW (NOTE) SARS-CoV-2 target nucleic acids are DETECTED  SARS-CoV-2 RNA is generally detectable in upper respiratory specimens  during the acute phase of infection.  Positive results are indicative  of the presence of the identified virus, but do not rule out bacterial infection or co-infection with other pathogens not detected by the test.   Clinical correlation with patient history and  other diagnostic information is necessary to determine patient infection status.  The expected result is negative.  Fact Sheet for Patients:   09/15/20   Fact Sheet for  Healthcare Providers:   https://pope.com/    This test is not yet approved or cleared by the [place] and  has been authorized for detection and/or diagnosis of SARS-CoV-2 by FDA under an Emergency Use Authorization (EUA).  This EUA will remain in effect (meaning this test can  be used) for the duration of  the COVID-19 declaration under Section 564(b)(1) of the Act, 21 U.S.C. section 360-bbb-3(b)(1), unless the authorization is terminated or revoked sooner.  Performed at [facility], [address], [place], [place] [address]   Culture, beta strep (group b only)     Status: None   Collection Time: 07/15/20 11:26 PM   Specimen: Vaginal/Rectal; Genital  Result Value Ref Range Status   Specimen Description VAGINAL/RECTAL  Final   Special Requests NONE  Final   Culture   Final    NO GROUP B STREP (S.AGALACTIAE) ISOLATED Performed at [facility], [address] [place], [place] [address]    Report Status 07/18/2020 FINAL  Final  Culture, beta strep (group b only)     Status: None (Preliminary result)   Collection Time: 07/23/20  4:24 PM   Specimen: Vaginal/Rectal; Genital  Result Value Ref Range Status   Specimen Description VAGINAL/RECTAL  Final   Special Requests NONE  Final   Culture   Final    CULTURE REINCUBATED FOR BETTER GROWTH Performed at [facility], [address], [place], [place] [address]    Report Status PENDING  Incomplete     Medications:   . vitamin C  500 mg Oral Daily  . aspirin EC  81 mg Oral Daily  . docusate sodium  100 mg Oral BID  . enoxaparin (LOVENOX) injection  40 mg Subcutaneous Q24H  . guaiFENesin  600 mg Oral BID  . predniSONE  70 mg Oral  Daily  . prenatal multivitamin  1 tablet Oral Q1200  . sertraline  25 mg Oral QHS  . zinc sulfate  220 mg Oral Daily   Continuous Infusions:     LOS: 8 days   Marinda Elk  [facility]  07/24/2020, 10:20 AM

## 2020-07-24 NOTE — Progress Notes (Signed)
Patient ID: Terry [name], female   DOB: 1996-04-25, 24 y.o.   MRN: [id number] FACULTY PRACTICE ANTEPARTUM(COMPREHENSIVE) NOTE  Terry [name] is a 24 y.o. G3P1011 at [redacted]w[redacted]d who is admitted for Covid pneumonia.   Fetal presentation is cephalic. Length of Stay:  8  Days  Subjective: Feels better , has congestion Patient reports the fetal movement as active. Patient reports uterine contraction  activity as none. Patient reports  vaginal bleeding as none. Patient describes fluid per vagina as None.  Vitals:  Blood pressure 115/63, pulse 79, temperature 97.6 F (36.4 C), temperature source Oral, resp. rate 20, height 5\' 10"  (1.778 m), weight 88.9 kg, last menstrual period 11/09/2019, SpO2 96 %. Physical Examination:  General appearance - alert, well appearing, and in no distress Heart - normal rate and regular rhythm Abdomen - soft, nontender, nondistended Fundal Height:  size equals dates Extremities: extremities normal, atraumatic, no cyanosis or edema and Homans sign is negative, no sign of DVT  Membranes:intact  Fetal Monitoring:   Fetal Heart Rate A  Mode External  [removed] filed at 07/24/2020 1157  Baseline Rate (A) 135 bpm  [beginning 140] filed at 07/24/2020 1157  Variability <5 BPM, 6-25 BPM filed at 07/24/2020 1157  Accelerations 15 x 15 filed at 07/24/2020 1157  Decelerations None filed at 07/24/2020 1157     Labs:  Results for orders placed or performed during the hospital encounter of 07/15/20 (from the past 24 hour(s))  Culture, beta strep (group b only)   Collection Time: 07/23/20  4:24 PM   Specimen: Vaginal/Rectal; Genital  Result Value Ref Range   Specimen Description VAGINAL/RECTAL    Special Requests NONE    Culture      CULTURE REINCUBATED FOR BETTER GROWTH Performed at [facility], [address], [place], [place]    Report Status PENDING      Medications:  Scheduled . vitamin C  500 mg Oral Daily  . aspirin EC  81 mg Oral Daily   . docusate sodium  100 mg Oral BID  . enoxaparin (LOVENOX) injection  40 mg Subcutaneous Q24H  . guaiFENesin  600 mg Oral BID  . predniSONE  70 mg Oral Daily  . prenatal multivitamin  1 tablet Oral Q1200  . sertraline  25 mg Oral QHS  . zinc sulfate  220 mg Oral Daily   I have reviewed the patient's current medications.  ASSESSMENT: Patient Active Problem List   Diagnosis Date Noted  . [redacted] weeks gestation of pregnancy 07/18/2020  . COVID-19 affecting pregnancy in third trimester 07/18/2020  . Pneumonia due to COVID-19 virus 07/16/2020  . Round ligament pain 06/09/2020  . History of prior pregnancy with short cervix, currently pregnant 02/25/2020  . Depression affecting pregnancy 01/29/2020  . Supervision of other normal pregnancy, antepartum 01/18/2020    PLAN: Dr. 03/19/2020 has signed off and she may be discharged tomorrow  Radonna Ricker 07/24/2020,2:17 PM

## 2020-07-24 NOTE — Care Management Note (Signed)
Case Management Note  Patient Details  Name: Terry [name] MRN: [id number] Date of Birth: 1996/01/08  Subjective/Objective:                  Terry [name] is a 24 y.o. G3P1011 at [redacted]w[redacted]d by early ultrasound who is admitted for COVID 19 pneumonia.     In-House Referral:   (Post Covid Care follow up)    Additional Comments: CM spoke to Dr. Debroah Loop and follow up appointment was made for patient at the [facility] on September 29th at 1:15 pm. (see AVS).  Patient has insurance, no barriers noted for medication needs.     Gretchen Short RNC-MNN, BSN Transitions of [facility] Pediatric[facility]  Emilio Math [place], [place] 07/24/2020, 11:13 AM

## 2020-07-25 LAB — CULTURE, BETA STREP (GROUP B ONLY)

## 2020-07-25 MED ORDER — ASPIRIN 81 MG PO TBEC: 81.0000 mg | DELAYED_RELEASE_TABLET | Freq: Every day | ORAL | 11 refills | Status: DC

## 2020-07-25 MED ORDER — GUAIFENESIN ER 600 MG PO TB12: 600.0000 mg | ORAL_TABLET | Freq: Two times a day (BID) | ORAL | 1 refills | Status: DC

## 2020-07-25 NOTE — Discharge Summary (Addendum)
Patient ID: Terry [name] MRN: [id number] DOB/AGE: 1996-10-16 24 y.o.  Admit date: 07/15/2020 Discharge date: 07/25/2020  Admission Diagnoses:  Discharge Diagnoses:   Prenatal Procedures: ultrasound  Consults: Neonatology, Maternal Fetal Medicine  Hospital Course:  This is a 24 y.o. G3P1011 with IUP at [redacted]w[redacted]d admitted for shortness of breath nausea vomiting and body aches was found to be febrile in the ED satting 90% on room air tachypneic and tachycardic with a blood pressure of 84/55.  Marland KitchenShe was covid 19 positive with right side pneumonia and admitted for management. She was co-manage with [facility]. She required O2 up to 12L/min and took several days to wean.She was afebrile and in fact her temperature was below 94 early in her stay.  She received a course of remdesivir and 10 days of methylPREDNISolone (SOLU-MEDROL) injection and prednisone. Fetal monitoring was reassuring.  -weaned off to room air, appears stable for dc  Discharge Exam: Temp:  [97.9 F (36.6 C)-98.2 F (36.8 C)] 98 F (36.7 C) (09/17 1127) Pulse Rate:  [68-97] 70 (09/17 1127) Resp:  [18-20] 18 (09/17 1127) BP: (111-144)/(53-84) 114/53 (09/17 1127) SpO2:  [92 %-99 %] 97 % (09/17 1127) Physical Examination: CONSTITUTIONAL: Well-developed, well-nourished female in no acute distress.  HENT:  Normocephalic, atraumatic, External right and left ear normal. Oropharynx is clear and moist EYES: Conjunctivae and EOM are normal. Pupils are equal, round, and reactive to light. No scleral icterus.  NECK: Normal range of motion, supple, no masses SKIN: Skin is warm and dry. No rash noted. Not diaphoretic. No erythema. No pallor. [place]: Alert and oriented to person, place, and time. Normal reflexes, muscle tone coordination. No cranial nerve deficit noted. PSYCHIATRIC: Normal mood and affect. Normal behavior. Normal judgment and thought content. CARDIOVASCULAR: Normal heart rate noted, regular  rhythm RESPIRATORY: Effort and breath sounds normal, no problems with respiration noted MUSCULOSKELETAL: Normal range of motion. No edema and no tenderness. 2+ distal pulses. ABDOMEN: Soft, nontender, nondistended, gravid. CERVIX: Dilation: 1.5 Effacement (%): 50 Station: -2 Presentation: Vertex Exam by:: Hansel Feinstein, CNM    Significant Diagnostic Studies:  Results for orders placed or performed during the hospital encounter of 07/15/20 (from the past 168 hour(s))  CBC with Differential/Platelet   Collection Time: 07/19/20  5:06 AM  Result Value Ref Range   WBC 8.5 4.0 - 10.5 K/uL   RBC 3.31 (L) 3.87 - 5.11 MIL/uL   Hemoglobin 10.5 (L) 12.0 - 15.0 g/dL   HCT 32.7 (L) 36 - 46 %   MCV 98.8 80.0 - 100.0 fL   MCH 31.7 26.0 - 34.0 pg   MCHC 32.1 30.0 - 36.0 g/dL   RDW 16.0 (H) 11.5 - 15.5 %   Platelets 239 150 - 400 K/uL   nRBC 0.5 (H) 0.0 - 0.2 %   Neutrophils Relative % 85 %   Neutro Abs 7.4 1.7 - 7.7 K/uL   Band Neutrophils 2 %   Lymphocytes Relative 10 %   Lymphs Abs 0.9 0.7 - 4.0 K/uL   Monocytes Relative 3 %   Monocytes Absolute 0.3 0 - 1 K/uL   Eosinophils Relative 0 %   Eosinophils Absolute 0.0 0 - 0 K/uL   Basophils Relative 0 %   Basophils Absolute 0.0 0 - 0 K/uL   Abs Immature Granulocytes 0.00 0.00 - 0.07 K/uL  Comprehensive metabolic panel   Collection Time: 07/19/20  5:06 AM  Result Value Ref Range   Sodium 137 135 - 145 mmol/L   Potassium 3.2 (L)  3.5 - 5.1 mmol/L   Chloride 107 98 - 111 mmol/L   CO2 20 (L) 22 - 32 mmol/L   Glucose, Bld 107 (H) 70 - 99 mg/dL   BUN <5 (L) 6 - 20 mg/dL   Creatinine, Ser 0.68 0.44 - 1.00 mg/dL   Calcium 7.9 (L) 8.9 - 10.3 mg/dL   Total Protein 5.0 (L) 6.5 - 8.1 g/dL   Albumin 2.0 (L) 3.5 - 5.0 g/dL   AST 36 15 - 41 U/L   ALT 16 0 - 44 U/L   Alkaline Phosphatase 97 38 - 126 U/L   Total Bilirubin 0.4 0.3 - 1.2 mg/dL   GFR calc non Af Amer >60 >60 mL/min   GFR calc Af Amer >60 >60 mL/min   Anion gap 10 5 - 15   C-reactive protein   Collection Time: 07/19/20  5:06 AM  Result Value Ref Range   CRP 5.0 (H) <1.0 mg/dL  Ferritin   Collection Time: 07/19/20  5:06 AM  Result Value Ref Range   Ferritin 37 11 - 307 ng/mL  Magnesium   Collection Time: 07/19/20  5:06 AM  Result Value Ref Range   Magnesium 1.7 1.7 - 2.4 mg/dL  D-dimer, quantitative (not at [facility])   Collection Time: 07/19/20  7:36 AM  Result Value Ref Range   D-Dimer, Quant 2.51 (H) 0.00 - 0.50 ug/mL-FEU  CBC with Differential/Platelet   Collection Time: 07/20/20  7:41 AM  Result Value Ref Range   WBC 9.3 4.0 - 10.5 K/uL   RBC 3.36 (L) 3.87 - 5.11 MIL/uL   Hemoglobin 10.6 (L) 12.0 - 15.0 g/dL   HCT 32.8 (L) 36 - 46 %   MCV 97.6 80.0 - 100.0 fL   MCH 31.5 26.0 - 34.0 pg   MCHC 32.3 30.0 - 36.0 g/dL   RDW 16.0 (H) 11.5 - 15.5 %   Platelets 250 150 - 400 K/uL   nRBC 0.6 (H) 0.0 - 0.2 %   Neutrophils Relative % 78 %   Neutro Abs 7.3 1.7 - 7.7 K/uL   Lymphocytes Relative 12 %   Lymphs Abs 1.1 0.7 - 4.0 K/uL   Monocytes Relative 4 %   Monocytes Absolute 0.4 0 - 1 K/uL   Eosinophils Relative 0 %   Eosinophils Absolute 0.0 0 - 0 K/uL   Basophils Relative 1 %   Basophils Absolute 0.1 0 - 0 K/uL   Immature Granulocytes 5 %   Abs Immature Granulocytes 0.45 (H) 0.00 - 0.07 K/uL   Polychromasia PRESENT   Comprehensive metabolic panel   Collection Time: 07/20/20  7:41 AM  Result Value Ref Range   Sodium 137 135 - 145 mmol/L   Potassium 3.1 (L) 3.5 - 5.1 mmol/L   Chloride 107 98 - 111 mmol/L   CO2 21 (L) 22 - 32 mmol/L   Glucose, Bld 102 (H) 70 - 99 mg/dL   BUN <5 (L) 6 - 20 mg/dL   Creatinine, Ser 0.73 0.44 - 1.00 mg/dL   Calcium 8.0 (L) 8.9 - 10.3 mg/dL   Total Protein 5.0 (L) 6.5 - 8.1 g/dL   Albumin 1.9 (L) 3.5 - 5.0 g/dL   AST 36 15 - 41 U/L   ALT 18 0 - 44 U/L   Alkaline Phosphatase 105 38 - 126 U/L   Total Bilirubin 0.5 0.3 - 1.2 mg/dL   GFR calc non Af Amer >60 >60 mL/min   GFR calc Af Amer >60 >60 mL/min   Anion  gap 9 5 - 15  C-reactive protein   Collection Time: 07/20/20  7:41 AM  Result Value Ref Range   CRP 5.2 (H) <1.0 mg/dL  D-dimer, quantitative (not at [facility])   Collection Time: 07/20/20  7:41 AM  Result Value Ref Range   D-Dimer, Quant 3.08 (H) 0.00 - 0.50 ug/mL-FEU  Ferritin   Collection Time: 07/20/20  7:41 AM  Result Value Ref Range   Ferritin 32 11 - 307 ng/mL  Magnesium   Collection Time: 07/20/20  7:41 AM  Result Value Ref Range   Magnesium 1.7 1.7 - 2.4 mg/dL  CBC with Differential/Platelet   Collection Time: 07/21/20  6:09 AM  Result Value Ref Range   WBC 10.4 4.0 - 10.5 K/uL   RBC 3.40 (L) 3.87 - 5.11 MIL/uL   Hemoglobin 10.4 (L) 12.0 - 15.0 g/dL   HCT 32.8 (L) 36 - 46 %   MCV 96.5 80.0 - 100.0 fL   MCH 30.6 26.0 - 34.0 pg   MCHC 31.7 30.0 - 36.0 g/dL   RDW 15.6 (H) 11.5 - 15.5 %   Platelets 250 150 - 400 K/uL   nRBC 0.6 (H) 0.0 - 0.2 %   Neutrophils Relative % 81 %   Neutro Abs 8.4 (H) 1.7 - 7.7 K/uL   Lymphocytes Relative 11 %   Lymphs Abs 1.2 0.7 - 4.0 K/uL   Monocytes Relative 5 %   Monocytes Absolute 0.5 0 - 1 K/uL   Eosinophils Relative 0 %   Eosinophils Absolute 0.0 0 - 0 K/uL   Basophils Relative 0 %   Basophils Absolute 0.0 0 - 0 K/uL   Immature Granulocytes 3 %   Abs Immature Granulocytes 0.35 (H) 0.00 - 0.07 K/uL  Comprehensive metabolic panel   Collection Time: 07/21/20  6:09 AM  Result Value Ref Range   Sodium 138 135 - 145 mmol/L   Potassium 3.3 (L) 3.5 - 5.1 mmol/L   Chloride 107 98 - 111 mmol/L   CO2 22 22 - 32 mmol/L   Glucose, Bld 87 70 - 99 mg/dL   BUN <5 (L) 6 - 20 mg/dL   Creatinine, Ser 0.68 0.44 - 1.00 mg/dL   Calcium 8.2 (L) 8.9 - 10.3 mg/dL   Total Protein 5.1 (L) 6.5 - 8.1 g/dL   Albumin 1.9 (L) 3.5 - 5.0 g/dL   AST 28 15 - 41 U/L   ALT 17 0 - 44 U/L   Alkaline Phosphatase 103 38 - 126 U/L   Total Bilirubin 0.6 0.3 - 1.2 mg/dL   GFR calc non Af Amer >60 >60 mL/min   GFR calc Af Amer >60 >60 mL/min   Anion gap 9 5 - 15   C-reactive protein   Collection Time: 07/21/20  6:09 AM  Result Value Ref Range   CRP 6.5 (H) <1.0 mg/dL  D-dimer, quantitative (not at [facility])   Collection Time: 07/21/20  6:09 AM  Result Value Ref Range   D-Dimer, Quant 2.95 (H) 0.00 - 0.50 ug/mL-FEU  Ferritin   Collection Time: 07/21/20  6:09 AM  Result Value Ref Range   Ferritin 25 11 - 307 ng/mL  Magnesium   Collection Time: 07/21/20  6:09 AM  Result Value Ref Range   Magnesium 1.7 1.7 - 2.4 mg/dL  GC/Chlamydia probe amp ([place])not at [facility]   Collection Time: 07/22/20 10:31 PM  Result Value Ref Range   Neisseria Gonorrhea Negative    Chlamydia Negative    Comment Normal Reference  [place] Chlamydia - Negative    Comment      Normal Reference Range Neisseria Gonorrhea - Negative  Culture, beta strep (group b only)   Collection Time: 07/23/20  4:24 PM   Specimen: Vaginal/Rectal; Genital  Result Value Ref Range   Specimen Description VAGINAL/RECTAL    Special Requests NONE    Culture      NO GROUP B STREP (S.AGALACTIAE) ISOLATED Performed at [facility], [place] [address] [place], [place] [address]    Report Status 07/25/2020 FINAL     Discharge Condition: Stable  Disposition: Discharge disposition: 01-Home or Self Care      Acute Hypoxic Respiratory Failure due to Covid-19 Viral Illness -weaned off to room air, appears stable for dc. -isolation for 21 days with start date 9/7 d/w patient -s/p remdesivir and 10 days of steroids  Discharge Instructions    Discharge patient   Complete by: As directed    Discharge disposition: 01-Home or Self Care   Discharge patient date: 07/25/2020     Allergies as of 07/25/2020   No Known Allergies     Medication List    TAKE these medications   aspirin 81 MG EC tablet Take 1 tablet (81 mg total) by mouth daily. Swallow whole. Start taking on: July 26, 2020   Blood Pressure Cuff Misc 1 Device by Does not apply route once a week.   Blood Pressure Kit  Devi 1 kit by Does not apply route once a week. Check Blood Pressure regularly and record readings into the [employer] App.  Large Cuff.  DX O90.0   Elastic Bandages & Supports Misc 1 Device by Does not apply route daily as needed.   ferrous sulfate 325 (65 FE) MG tablet Take 1 tablet (325 mg total) by mouth 2 (two) times daily with a meal.   guaiFENesin 600 MG 12 hr tablet Commonly known as: MUCINEX Take 1 tablet (600 mg total) by mouth 2 (two) times daily.   PRENATAL VITAMINS PO Take by mouth.   Prenatal Complete 14-0.4 MG Tabs Take 1 tablet by mouth daily.   sertraline 25 MG tablet Commonly known as: Zoloft Take 1 tablet (25 mg total) by mouth at bedtime.       Follow-up Information    [facility] Follow up.   Why: Appointment is September 29th at 1:15 pm (Wednesday).   Contact information: [place] [id number] [place] Follow up in 1 week(s).   Specialty: Obstetrics and Gynecology Why: virtual covid pos Contact information: [address] [place] [address] [place] [phone]              Signed: Emeterio Reeve M.D. 07/25/2020, 12:17 PM

## 2020-07-25 NOTE — Progress Notes (Signed)
Discharge instructions and prescriptions given to pt. Discussed signs and symptoms to report to the MD, upcoming appointments, and meds. Pt verbalizes understanding and has no questions or concerns at this time. Pt discharged from hospital in stable condition. 

## 2020-07-25 NOTE — Discharge Instructions (Signed)
10 Things You Can Do to Manage Your COVID-19 Symptoms at Home If you have possible or confirmed COVID-19: 1. Stay home from work and school. And stay away from other public places. If you must go out, avoid using any kind of public transportation, ridesharing, or taxis. 2. Monitor your symptoms carefully. If your symptoms get worse, call your healthcare provider immediately. 3. Get rest and stay hydrated. 4. If you have a medical appointment, call the healthcare provider ahead of time and tell them that you have or may have COVID-19. 5. For medical emergencies, call 911 and notify the dispatch personnel that you have or may have COVID-19. 6. Cover your cough and sneezes with a tissue or use the inside of your elbow. 7. Wash your hands often with soap and water for at least 20 seconds or clean your hands with an alcohol-based hand sanitizer that contains at least 60% alcohol. 8. As much as possible, stay in a specific room and away from other people in your home. Also, you should use a separate bathroom, if available. If you need to be around other people in or outside of the home, wear a mask. 9. Avoid sharing personal items with other people in your household, like dishes, towels, and bedding. 10. Clean all surfaces that are touched often, like counters, tabletops, and doorknobs. Use household cleaning sprays or wipes according to the label instructions. cdc.gov/coronavirus 05/09/2019 This information is not intended to replace advice given to you by your health care provider. Make sure you discuss any questions you have with your health care provider. Document Revised: 10/11/2019 Document Reviewed: 10/11/2019 Elsevier Patient Education  2020 Elsevier Inc.  

## 2020-07-25 NOTE — Progress Notes (Signed)
PROGRESS NOTE  Terry [name] VPX:[id number] DOB: 1995/12/12 DOA: 07/15/2020 PCP: Inc, [facility]   LOS: 9 days   Brief Narrative / Interim history: Terry [name] is an 24 y.o. female 35 weeks and 4 days pregnant who came in with shortness of breath nausea vomiting and body aches was found to be febrile in the ED satting 90% on room air tachypneic and tachycardic with a blood pressure of 84/55.  Subjective / 24h Interval events: Doing better this morning, on room air. Breathing ok ambulating on room air.  Assessment & Plan:  Principal Problem Acute Hypoxic Respiratory Failure due to Covid-19 Viral Illness -weaned off to room air, appears stable for dc. -isolation for 21 days with start date 9/7 d/w patient -s/p remdesivir and 10 days of steroids.  No results for input(s): DDIMER, FERRITIN, LDH, CRP in the last 72 hours.  Lab Results  Component Value Date   SARSCOV2NAA POSITIVE (A) 07/15/2020    Scheduled Meds: . vitamin C  500 mg Oral Daily  . aspirin EC  81 mg Oral Daily  . docusate sodium  100 mg Oral BID  . enoxaparin (LOVENOX) injection  40 mg Subcutaneous Q24H  . guaiFENesin  600 mg Oral BID  . influenza vac split quadrivalent PF  0.5 mL Intramuscular Tomorrow-1000  . predniSONE  70 mg Oral Daily  . prenatal multivitamin  1 tablet Oral Q1200  . sertraline  25 mg Oral QHS  . zinc sulfate  220 mg Oral Daily   Continuous Infusions: PRN Meds:.acetaminophen, benzonatate, menthol-cetylpyridinium, promethazine, sodium chloride  DVT prophylaxis: Lovenox Code Status: Full code Family Communication: no family at bedside   Procedures:  None   Microbiology: None   Antibacterials: None    Objective: Vitals:   07/24/20 1725 07/24/20 2050 07/24/20 2245 07/24/20 2246  BP:  121/69 (!) 144/84 135/79  Pulse:  77 68 69  Resp:  20 20   Temp:  98.1 F (36.7 C) 98.2 F (36.8 C)   TempSrc:  Oral Oral   SpO2: 99% 97% 96%   Weight:      Height:        No intake or output data in the 24 hours ending 07/25/20 0722 Filed Weights   07/16/20 0545  Weight: 88.9 kg    Examination:  Constitutional: NAD Respiratory: clear to auscultation bilaterally, no wheezing, no crackles. Cardiovascular: Regular rate and rhythm   Data Reviewed: I have independently reviewed following labs and imaging studies   CBC: Recent Labs  Lab 07/19/20 0506 07/20/20 0741 07/21/20 0609  WBC 8.5 9.3 10.4  NEUTROABS 7.4 7.3 8.4*  HGB 10.5* 10.6* 10.4*  HCT 32.7* 32.8* 32.8*  MCV 98.8 97.6 96.5  PLT 239 250 250   Basic Metabolic Panel: Recent Labs  Lab 07/19/20 0506 07/20/20 0741 07/21/20 0609  NA 137 137 138  K 3.2* 3.1* 3.3*  CL 107 107 107  CO2 20* 21* 22  GLUCOSE 107* 102* 87  BUN <5* <5* <5*  CREATININE 0.68 0.73 0.68  CALCIUM 7.9* 8.0* 8.2*  MG 1.7 1.7 1.7   GFR: Estimated Creatinine Clearance: 129 mL/min (by C-G formula based on SCr of 0.68 mg/dL). Liver Function Tests: Recent Labs  Lab 07/19/20 0506 07/20/20 0741 07/21/20 0609  AST 36 36 28  ALT 16 18 17   ALKPHOS 97 105 103  BILITOT 0.4 0.5 0.6  PROT 5.0* 5.0* 5.1*  ALBUMIN 2.0* 1.9* 1.9*   No results for input(s): LIPASE, AMYLASE in the last  168 hours. No results for input(s): AMMONIA in the last 168 hours. Coagulation Profile: No results for input(s): INR, PROTIME in the last 168 hours. Cardiac Enzymes: No results for input(s): CKTOTAL, CKMB, CKMBINDEX, TROPONINI in the last 168 hours. BNP (last 3 results) No results for input(s): PROBNP in the last 8760 hours. HbA1C: No results for input(s): HGBA1C in the last 72 hours. CBG: No results for input(s): GLUCAP in the last 168 hours. Lipid Profile: No results for input(s): CHOL, HDL, LDLCALC, TRIG, CHOLHDL, LDLDIRECT in the last 72 hours. Thyroid Function Tests: No results for input(s): TSH, T4TOTAL, FREET4, T3FREE, THYROIDAB in the last 72 hours. Anemia Panel: No results for input(s): VITAMINB12, FOLATE,  FERRITIN, TIBC, IRON, RETICCTPCT in the last 72 hours. Urine analysis:    Component Value Date/Time   COLORURINE AMBER (A) 06/12/2020 1900   APPEARANCEUR HAZY (A) 06/12/2020 1900   LABSPEC 1.027 06/12/2020 1900   PHURINE 5.0 06/12/2020 1900   GLUCOSEU NEGATIVE 06/12/2020 1900   HGBUR NEGATIVE 06/12/2020 1900   BILIRUBINUR NEGATIVE 06/12/2020 1900   BILIRUBINUR negative 01/10/2019 1616   BILIRUBINUR neg 07/14/2016 0948   KETONESUR 5 (A) 06/12/2020 1900   PROTEINUR 100 (A) 06/12/2020 1900   UROBILINOGEN 0.2 09/16/2019 1236   NITRITE NEGATIVE 06/12/2020 1900   LEUKOCYTESUR NEGATIVE 06/12/2020 1900   Sepsis Labs: Invalid input(s): PROCALCITONIN, LACTICIDVEN  Recent Results (from the past 240 hour(s))  SARS Coronavirus 2 by RT PCR (hospital order, performed in [facility] hospital lab) Nasopharyngeal Nasopharyngeal Swab     Status: Abnormal   Collection Time: 07/15/20 11:26 PM   Specimen: Nasopharyngeal Swab  Result Value Ref Range Status   SARS Coronavirus 2 POSITIVE (A) NEGATIVE Final    Comment: RESULT CALLED TO, READ BACK BY AND VERIFIED WITH: A SEDANO RN 07/16/20 0043 JDW (NOTE) SARS-CoV-2 target nucleic acids are DETECTED  SARS-CoV-2 RNA is generally detectable in upper respiratory specimens  during the acute phase of infection.  Positive results are indicative  of the presence of the identified virus, but do not rule out bacterial infection or co-infection with other pathogens not detected by the test.  Clinical correlation with patient history and  other diagnostic information is necessary to determine patient infection status.  The expected result is negative.  Fact Sheet for Patients:   BoilerBrush.com.cy   Fact Sheet for Healthcare Providers:   https://pope.com/    This test is not yet approved or cleared by the [place] FDA and  has been authorized for detection and/or diagnosis of SARS-CoV-2 by FDA under an  Emergency Use Authorization (EUA).  This EUA will remain in effect (meaning this test can  be used) for the duration of  the COVID-19 declaration under Section 564(b)(1) of the Act, 21 U.S.C. section 360-bbb-3(b)(1), unless the authorization is terminated or revoked sooner.  Performed at [facility], [address] [place], [place] [address]   Culture, beta strep (group b only)     Status: None   Collection Time: 07/15/20 11:26 PM   Specimen: Vaginal/Rectal; Genital  Result Value Ref Range Status   Specimen Description VAGINAL/RECTAL  Final   Special Requests NONE  Final   Culture   Final    NO GROUP B STREP (S.AGALACTIAE) ISOLATED Performed at [facility], [name] [place], [place] [address]    Report Status 07/18/2020 FINAL  Final  Culture, beta strep (group b only)     Status: None (Preliminary result)   Collection Time: 07/23/20  4:24 PM   Specimen: Vaginal/Rectal; Genital  Result Value Ref Range Status   Specimen Description VAGINAL/RECTAL  Final   Special Requests NONE  Final   Culture   Final    CULTURE REINCUBATED FOR BETTER GROWTH Performed at [facility], [address] [place], [place] [address]    Report Status PENDING  Incomplete      Radiology Studies: No results found.   Pamella Pert, MD, PhD [facility]  Between 7 am - 7 pm I am available, please contact me via Amion or Securechat  Between 7 pm - 7 am I am not available, please contact night coverage MD/APP via Amion

## 2020-07-27 ENCOUNTER — Other Ambulatory Visit: Payer: Self-pay | Admitting: Advanced Practice Midwife

## 2020-07-27 ENCOUNTER — Encounter (HOSPITAL_COMMUNITY): Payer: Self-pay | Admitting: Obstetrics and Gynecology

## 2020-07-27 ENCOUNTER — Other Ambulatory Visit: Payer: Self-pay

## 2020-07-27 ENCOUNTER — Inpatient Hospital Stay (HOSPITAL_COMMUNITY)
Admission: AD | Admit: 2020-07-27 | Discharge: 2020-07-29 | DRG: 805 | Disposition: A | Discharge status: Home / Self Care | Payer: Medicaid Other | Attending: Obstetrics and Gynecology | Admitting: Obstetrics and Gynecology

## 2020-07-27 DIAGNOSIS — U071 COVID-19: Secondary | ICD-10-CM | POA: Diagnosis present

## 2020-07-27 DIAGNOSIS — O09299 Supervision of pregnancy with other poor reproductive or obstetric history, unspecified trimester: Secondary | ICD-10-CM

## 2020-07-27 DIAGNOSIS — O26893 Other specified pregnancy related conditions, third trimester: Secondary | ICD-10-CM | POA: Diagnosis present

## 2020-07-27 DIAGNOSIS — O134 Gestational [pregnancy-induced] hypertension without significant proteinuria, complicating childbirth: Principal | ICD-10-CM | POA: Diagnosis present

## 2020-07-27 DIAGNOSIS — Z3689 Encounter for other specified antenatal screening: Secondary | ICD-10-CM

## 2020-07-27 DIAGNOSIS — Z3A37 37 weeks gestation of pregnancy: Secondary | ICD-10-CM | POA: Diagnosis not present

## 2020-07-27 DIAGNOSIS — O9852 Other viral diseases complicating childbirth: Secondary | ICD-10-CM | POA: Diagnosis present

## 2020-07-27 DIAGNOSIS — O99340 Other mental disorders complicating pregnancy, unspecified trimester: Secondary | ICD-10-CM

## 2020-07-27 LAB — COMPREHENSIVE METABOLIC PANEL
ALT: 17 U/L (ref 0–44)
AST: 26 U/L (ref 15–41)
Albumin: 1.9 g/dL — ABNORMAL LOW (ref 3.5–5.0)
Alkaline Phosphatase: 85 U/L (ref 38–126)
Anion gap: 9 (ref 5–15)
BUN: 6 mg/dL (ref 6–20)
CO2: 23 mmol/L (ref 22–32)
Calcium: 8.2 mg/dL — ABNORMAL LOW (ref 8.9–10.3)
Chloride: 102 mmol/L (ref 98–111)
Creatinine, Ser: 0.66 mg/dL (ref 0.44–1.00)
GFR calc Af Amer: >60 mL/min (ref >60)
GFR calc non Af Amer: >60 mL/min (ref >60)
Glucose, Bld: 100 mg/dL — ABNORMAL HIGH (ref 70–99)
Potassium: 3.7 mmol/L (ref 3.5–5.1)
Sodium: 134 mmol/L — ABNORMAL LOW (ref 135–145)
Total Bilirubin: 0.5 mg/dL (ref 0.3–1.2)
Total Protein: 5.1 g/dL — ABNORMAL LOW (ref 6.5–8.1)

## 2020-07-27 LAB — CBC
HCT: 40.9 % (ref 36.0–46.0)
Hemoglobin: 13 g/dL (ref 12.0–15.0)
MCH: 31.4 pg (ref 26.0–34.0)
MCHC: 31.8 g/dL (ref 30.0–36.0)
MCV: 98.8 fL (ref 80.0–100.0)
Platelets: 301 10*3/uL (ref 150–400)
RBC: 4.14 MIL/uL (ref 3.87–5.11)
RDW: 15.3 % (ref 11.5–15.5)
WBC: 17.5 10*3/uL — ABNORMAL HIGH (ref 4.0–10.5)
nRBC: 0.1 % (ref 0.0–0.2)

## 2020-07-27 LAB — TYPE AND SCREEN
ABO/RH(D): B POS
Antibody Screen: NEGATIVE

## 2020-07-27 MED ORDER — ONDANSETRON HCL 4 MG/2ML IJ SOLN: 4.0000 mg | Freq: Four times a day (QID) | INTRAMUSCULAR | Status: DC | PRN

## 2020-07-27 MED ORDER — MEASLES, MUMPS & RUBELLA VAC IJ SOLR: 0.5000 mL | Freq: Once | INTRAMUSCULAR | Status: DC

## 2020-07-27 MED ORDER — IBUPROFEN 200 MG PO TABS
400.0000 mg | ORAL_TABLET | Freq: Four times a day (QID) | ORAL | Status: DC | PRN
  Administered 2020-07-28: 400 mg via ORAL
  Filled 2020-07-27: qty 2

## 2020-07-27 MED ORDER — TETANUS-DIPHTH-ACELL PERTUSSIS 5-2.5-18.5 LF-MCG/0.5 IM SUSP: 0.5000 mL | Freq: Once | INTRAMUSCULAR | Status: DC

## 2020-07-27 MED ORDER — COCONUT OIL OIL: 1.0000 "application " | TOPICAL_OIL | Status: DC | PRN

## 2020-07-27 MED ORDER — WITCH HAZEL-GLYCERIN EX PADS: 1.0000 "application " | MEDICATED_PAD | CUTANEOUS | Status: DC | PRN

## 2020-07-27 MED ORDER — LIDOCAINE HCL (PF) 1 % IJ SOLN
30.0000 mL | INTRAMUSCULAR | Status: DC | PRN
  Filled 2020-07-27: qty 30

## 2020-07-27 MED ORDER — LACTATED RINGERS IV SOLN: INTRAVENOUS | Status: DC

## 2020-07-27 MED ORDER — DIBUCAINE (PERIANAL) 1 % EX OINT: 1.0000 "application " | TOPICAL_OINTMENT | CUTANEOUS | Status: DC | PRN

## 2020-07-27 MED ORDER — BENZOCAINE-MENTHOL 20-0.5 % EX AERO: 1.0000 "application " | INHALATION_SPRAY | CUTANEOUS | Status: DC | PRN

## 2020-07-27 MED ORDER — IBUPROFEN 600 MG PO TABS
600.0000 mg | ORAL_TABLET | Freq: Four times a day (QID) | ORAL | Status: DC
  Administered 2020-07-27: 600 mg via ORAL
  Filled 2020-07-27: qty 1

## 2020-07-27 MED ORDER — SODIUM CHLORIDE 0.9 % IV SOLN: 250.0000 mL | INTRAVENOUS | Status: DC | PRN

## 2020-07-27 MED ORDER — HYDROCHLOROTHIAZIDE 12.5 MG PO CAPS: 12.5000 mg | ORAL_CAPSULE | Freq: Every day | ORAL | Status: DC

## 2020-07-27 MED ORDER — HYDROCHLOROTHIAZIDE 25 MG PO TABS
25.0000 mg | ORAL_TABLET | Freq: Every day | ORAL | Status: DC
  Administered 2020-07-27 – 2020-07-29 (×3): 25 mg via ORAL
  Filled 2020-07-27 (×3): qty 1

## 2020-07-27 MED ORDER — PRENATAL MULTIVITAMIN CH
1.0000 | ORAL_TABLET | Freq: Every day | ORAL | Status: DC
  Administered 2020-07-28 – 2020-07-29 (×2): 1 via ORAL
  Filled 2020-07-27 (×2): qty 1

## 2020-07-27 MED ORDER — DIPHENHYDRAMINE HCL 25 MG PO CAPS: 25.0000 mg | ORAL_CAPSULE | Freq: Four times a day (QID) | ORAL | Status: DC | PRN

## 2020-07-27 MED ORDER — METHYLERGONOVINE MALEATE 0.2 MG/ML IJ SOLN: 0.2000 mg | Freq: Once | INTRAMUSCULAR | Status: AC

## 2020-07-27 MED ORDER — LACTATED RINGERS IV SOLN: 500.0000 mL | INTRAVENOUS | Status: DC | PRN

## 2020-07-27 MED ORDER — ACETAMINOPHEN 325 MG PO TABS: 650.0000 mg | ORAL_TABLET | ORAL | Status: DC | PRN

## 2020-07-27 MED ORDER — ACETAMINOPHEN 325 MG PO TABS
650.0000 mg | ORAL_TABLET | ORAL | Status: DC | PRN
  Administered 2020-07-27: 650 mg via ORAL
  Filled 2020-07-27: qty 2

## 2020-07-27 MED ORDER — SODIUM CHLORIDE 0.9% FLUSH: 3.0000 mL | INTRAVENOUS | Status: DC | PRN

## 2020-07-27 MED ORDER — ONDANSETRON HCL 4 MG/2ML IJ SOLN: 4.0000 mg | INTRAMUSCULAR | Status: DC | PRN

## 2020-07-27 MED ORDER — SENNOSIDES-DOCUSATE SODIUM 8.6-50 MG PO TABS
2.0000 | ORAL_TABLET | ORAL | Status: DC
  Administered 2020-07-28 – 2020-07-29 (×2): 2 via ORAL
  Filled 2020-07-27 (×2): qty 2

## 2020-07-27 MED ORDER — OXYCODONE-ACETAMINOPHEN 5-325 MG PO TABS: 2.0000 | ORAL_TABLET | ORAL | Status: DC | PRN

## 2020-07-27 MED ORDER — SOD CITRATE-CITRIC ACID 500-334 MG/5ML PO SOLN: 30.0000 mL | ORAL | Status: DC | PRN

## 2020-07-27 MED ORDER — METHYLERGONOVINE MALEATE 0.2 MG/ML IJ SOLN
INTRAMUSCULAR | Status: AC
  Administered 2020-07-27: 0.2 mg via INTRAMUSCULAR
  Filled 2020-07-27: qty 1

## 2020-07-27 MED ORDER — OXYTOCIN BOLUS FROM INFUSION
333.0000 mL | Freq: Once | INTRAVENOUS | Status: AC
  Administered 2020-07-27: 333 mL via INTRAVENOUS

## 2020-07-27 MED ORDER — SIMETHICONE 80 MG PO CHEW: 80.0000 mg | CHEWABLE_TABLET | ORAL | Status: DC | PRN

## 2020-07-27 MED ORDER — OXYCODONE-ACETAMINOPHEN 5-325 MG PO TABS: 1.0000 | ORAL_TABLET | ORAL | Status: DC | PRN

## 2020-07-27 MED ORDER — ONDANSETRON HCL 4 MG PO TABS: 4.0000 mg | ORAL_TABLET | ORAL | Status: DC | PRN

## 2020-07-27 MED ORDER — OXYTOCIN-SODIUM CHLORIDE 30-0.9 UT/500ML-% IV SOLN
2.5000 [IU]/h | INTRAVENOUS | Status: DC
  Filled 2020-07-27: qty 500

## 2020-07-27 MED ORDER — SERTRALINE HCL 50 MG PO TABS
50.0000 mg | ORAL_TABLET | Freq: Every day | ORAL | Status: DC
  Administered 2020-07-27 – 2020-07-28 (×2): 50 mg via ORAL
  Filled 2020-07-27 (×2): qty 1

## 2020-07-27 MED ORDER — ACETAMINOPHEN 500 MG PO TABS
1000.0000 mg | ORAL_TABLET | Freq: Four times a day (QID) | ORAL | Status: DC
  Administered 2020-07-27 – 2020-07-29 (×8): 1000 mg via ORAL
  Filled 2020-07-27 (×8): qty 2

## 2020-07-27 MED ORDER — SODIUM CHLORIDE 0.9% FLUSH
3.0000 mL | Freq: Two times a day (BID) | INTRAVENOUS | Status: DC
  Administered 2020-07-27: 3 mL via INTRAVENOUS

## 2020-07-27 NOTE — MAU Note (Signed)
Vertex presentation confirmed via BS [place] by NP lawrence.

## 2020-07-27 NOTE — Progress Notes (Signed)
Pt informed that the ultrasound is considered a limited OB ultrasound and is not intended to be a complete ultrasound exam.  Patient also informed that the ultrasound is not being completed with the intent of assessing for fetal or placental anomalies or any pelvic abnormalities.  Explained that the purpose of today's ultrasound is to assess for  presentation.  Patient acknowledges the purpose of the exam and the limitations of the study.    Cephalic  Tamieka Rancourt, NP   

## 2020-07-27 NOTE — MAU Note (Signed)
Pt arrived in mau via ems with c/o ctx q 1-2 min since 0100 this morning.  Pt denies LOF or vag bleeding, and reports good fetal movement.  Pt arrived with nasal can. On 2L of O2.SPO2 90 upon arrival.  Rating pain 10/10.

## 2020-07-27 NOTE — Discharge Summary (Signed)
Postpartum Discharge Summary      Patient Name: Terry [name] DOB: 05-Jun-1996 MRN: [id number]  Date of admission: 07/27/2020 Delivery date:07/27/2020  Delivering provider: Manya Silvas  Date of discharge: 07/29/2020  Admitting diagnosis: Indication for care in labor and delivery, antepartum [O75.9] Intrauterine pregnancy: [redacted]w[redacted]d     Secondary diagnosis:  Active Problems:   COVID-19 affecting pregnancy in third trimester   Indication for care in labor and delivery, antepartum   Vaginal delivery  Additional problems: Symptomatic COVID +    Discharge diagnosis: Term Pregnancy Delivered and Gestational Hypertension                                              Post partum procedures: None Augmentation: None Complications: None  Hospital course: Onset of Labor With Vaginal Delivery      24 y.o. yo [id number] at [redacted]w[redacted]d was admitted in Active Labor on 07/27/2020. Patient had an uncomplicated labor course as follows:  Membrane Rupture Time/Date: 9:25 AM ,07/27/2020   Delivery Method:Vaginal, Spontaneous  Episiotomy: None  Lacerations:  None  Patient had an uncomplicated postpartum course.  She is ambulating, tolerating a regular diet, passing flatus, and urinating well. Patient is discharged home in stable condition on 07/29/20.  Newborn Data: Birth date:07/27/2020  Birth time:9:35 AM  Gender:Female  Living status:Living  Apgars:8 ,9  Weight:3580 g   Magnesium Sulfate received: No BMZ received: No Rhophylac:N/A MMR:N/A T-DaP:Given prenatally Flu: No Transfusion:No  Physical exam  Vitals:   07/28/20 2010 07/28/20 2100 07/29/20 0615 07/29/20 0928  BP: 111/62  126/80 109/74  Pulse: 100  60 92  Resp: 20  18   Temp: 98.5 F (36.9 C)  98 F (36.7 C)   TempSrc: Oral  Oral   SpO2: 95% 97% 98%    General: alert, cooperative and no distress Lochia: appropriate Uterine Fundus: firm Incision: N/A DVT Evaluation: No evidence of DVT seen on physical exam. Labs: Lab Results   Component Value Date   WBC 17.5 (H) 07/27/2020   HGB 13.0 07/27/2020   HCT 40.9 07/27/2020   MCV 98.8 07/27/2020   PLT 301 07/27/2020   CMP Latest Ref Rng & Units 07/27/2020  Glucose 70 - 99 mg/dL 100(H)  BUN 6 - 20 mg/dL 6  Creatinine 0.44 - 1.00 mg/dL 0.66  Sodium 135 - 145 mmol/L 134(L)  Potassium 3.5 - 5.1 mmol/L 3.7  Chloride 98 - 111 mmol/L 102  CO2 22 - 32 mmol/L 23  Calcium 8.9 - 10.3 mg/dL 8.2(L)  Total Protein 6.5 - 8.1 g/dL 5.1(L)  Total Bilirubin 0.3 - 1.2 mg/dL 0.5  Alkaline Phos 38 - 126 U/L 85  AST 15 - 41 U/L 26  ALT 0 - 44 U/L 17   Edinburgh Score: Edinburgh Postnatal Depression Scale Screening Tool 07/29/2020  I have been able to laugh and see the funny side of things. (No Data)     After visit meds:  Allergies as of 07/29/2020   No Known Allergies     Medication List    STOP taking these medications   aspirin 81 MG EC tablet     TAKE these medications   acetaminophen 500 MG tablet Commonly known as: TYLENOL Take 2 tablets (1,000 mg total) by mouth every 6 (six) hours.   Blood Pressure Cuff Misc 1 Device by Does not apply route once a week.  Blood Pressure Kit Devi 1 kit by Does not apply route once a week. Check Blood Pressure regularly and record readings into the Babyscripts App.  Large Cuff.  DX O90.0   Elastic Bandages & Supports Misc 1 Device by Does not apply route daily as needed.   ferrous sulfate 325 (65 FE) MG tablet Take 1 tablet (325 mg total) by mouth 2 (two) times daily with a meal.   guaiFENesin 600 MG 12 hr tablet Commonly known as: MUCINEX Take 1 tablet (600 mg total) by mouth 2 (two) times daily.   hydrochlorothiazide 25 MG tablet Commonly known as: HYDRODIURIL Take 1 tablet (25 mg total) by mouth daily.   ibuprofen 400 MG tablet Commonly known as: ADVIL Take 1 tablet (400 mg total) by mouth every 6 (six) hours as needed for moderate pain or cramping (If not controlled with tylenol).   PRENATAL VITAMINS PO Take  by mouth.   Prenatal Complete 14-0.4 MG Tabs Take 1 tablet by mouth daily.   sertraline 50 MG tablet Commonly known as: ZOLOFT Take 1 tablet (50 mg total) by mouth daily. What changed:   medication strength  how much to take  when to take this        Discharge home in stable condition Infant Feeding: Bottle Infant Disposition:home with mother Discharge instruction: per After Visit Summary and Postpartum booklet. Activity: Advance as tolerated. Pelvic rest for 6 weeks.  Diet: routine diet Future Appointments: Future Appointments  Date Time Provider [place]  08/04/2020 10:20 AM [place] None  08/06/2020  1:15 PM TOC-PROVIDER [facility] None  08/25/2020  3:00 PM [place], Kathie Dike, CNM [facility] None   Follow up Visit:   Please schedule this patient for a Virtual postpartum visit in 4 weeks with the following provider: Any provider. Additional Postpartum F/U:BP check 2-3 days  High risk pregnancy complicated by: HTN and COVID Delivery mode:  Vaginal, Spontaneous  Anticipated Birth Control:  Depo   07/29/2020 Merilyn Baba, DO

## 2020-07-27 NOTE — Progress Notes (Signed)
RN spoke with On Call Resident, plan for the evening is to start pt on HCTZ daily to help with fluid and BP's. Instructed to leave pt on 1L O2 throughout the night will consider weaning tomorrow. RN will continue to monitor pt closely.   Herbert Moors, RN

## 2020-07-27 NOTE — H&P (Signed)
HPI: Terry [name] is a 24 y.o. year old G74P2012 female at [redacted]w[redacted]d weeks gestation who presents to MAU reporting Labor. % cm in MAU. Progressed rapidly to 10 cm and delivered soon after arrival to L&D.   Hospitalized for Symptomatic COVID PNA 07/15/20-07/25/20. S/P 10 days Remdesivir and steroids.   Pt's O2 sats 90% in RA in EMS-->started Ox 2L. Still 90% on 2L-->increased to 3L in MAU. O2 Sats ~95 upon arrival to L&D.     Nursing Staff Provider  Office Location  [facility] Dating  8 wk [place]  Language   ENGLISH Anatomy US  Nml  Flu Vaccine   Genetic Screen  NIPS: Low risk female  AFP:     TDaP vaccine   Given 05/19/20 Hgb A1C or  GTT Early  Third trimester   Rhogam  NA   LAB RESULTS   Feeding Plan Breast/Bottle Blood Type B/Positive/-- (03/23 0946)   Contraception Unsure Antibody Negative (03/23 0946)  Circumcision Yes Rubella 16.90 (03/23 0946)  [facility]ediatrician  [facility] RPR Non Reactive (07/12 1005)   Support Person unsure HBsAg Negative (03/23 0946)   Prenatal Classes  HIV Non Reactive (07/12 1005)  BTL Consent Consent 05/19/20, but undecided GBS Neg   VBAC Consent  Pap     Hgb Electro  neg  BP Cuff  CF neg    SMA neg    Waterbirth  [ ]  Class [ ]  Consent [ ]  CNM visit     OB History    Gravida  3   Para  2   Term  2   Preterm      AB  1   Living  2     SAB  1   TAB  0   Ectopic      Multiple  0   Live Births  2          Past Medical History:  Diagnosis Date  . Chlamydia   . Depression   . Gonorrhea    Past Surgical History:  Procedure Laterality Date  . NO PAST SURGERIES     Family History: family history includes Cancer in her maternal grandmother; Diabetes in her father, maternal uncle, and mother; Hypertension in her mother. Social History:  reports that she has never smoked. She has never used smokeless tobacco. She reports that she does not drink alcohol and does not use drugs.     Maternal Diabetes: No Genetic Screening: Normal Maternal  Ultrasounds/Referrals: Normal Fetal Ultrasounds or other Referrals:  None Maternal Substance Abuse:  No Significant Maternal Medications:  None Significant Maternal Lab Results:  Group B Strep negative and Other:  Other Comments:  Hospitalized for symptomatic COVID 07/15/20-07/25/20.   Review of Systems Maternal Medical History:  Reason for admission: Contractions.   Contractions: Perceived severity is strong.    Fetal activity: Perceived fetal activity is normal.    Prenatal complications: Infection (COVID 07/15/20).   No PIH.   Prenatal Complications - Diabetes: none.    Dilation: 10 Effacement (%): 80 Station: Crowning Exam by:: v Ora Mcnatt cnm Blood pressure 130/89, pulse 67, temperature 98.8 F (37.1 C), temperature source Oral, resp. rate 20, last menstrual period 11/09/2019, SpO2 100 %, unknown if currently breastfeeding. Maternal Exam:  Uterine Assessment: Contraction strength is firm.  Contraction frequency is regular.   Abdomen: Patient reports no abdominal tenderness. Estimated fetal weight is 7lb 8oz.   Fetal presentation: vertex  Introitus: Normal vulva. Normal vagina.  Pelvis: adequate for delivery.   Cervix:  Cervix evaluated by digital exam.     Fetal Exam Fetal Monitor Review: Mode: ultrasound.   Baseline rate: 140.  Variability: moderate (6-25 bpm).   Pattern: no decelerations and accelerations present.    Fetal State Assessment: Category I - tracings are normal.     Physical Exam Vitals and nursing note reviewed. Exam conducted with a chaperone present.  Constitutional:      General: She is in acute distress (Screaming).     Appearance: Normal appearance.  Eyes:     Conjunctiva/sclera: Conjunctivae normal.  Cardiovascular:     Rate and Rhythm: Normal rate.  Pulmonary:     Effort: Pulmonary effort is normal. No tachypnea, bradypnea, accessory muscle usage or respiratory distress.     Breath sounds: Normal breath sounds.  Abdominal:     Tenderness:  There is no abdominal tenderness.  Genitourinary:    General: Normal vulva.  Neurological:     Mental Status: She is alert.     Prenatal labs: ABO, Rh: --/--/B POS (09/19 0955) Antibody: NEG (09/19 0955) Rubella: 16.90 (03/23 0946) RPR: Non Reactive (07/12 1005)  HBsAg: Negative (03/23 0946)  HIV: Non Reactive (07/12 1005)  GBS: Negative/-- (09/15 0000)   Assessment: 1. Labor: Second stage 2. Fetal Wellbeing: Category I  3. Pain Control: Comfort measures 4. GBS: Neg 5. 37.2 week IUP 6. COVID + requiring O2  Plan:  1. Admit to BS per consult with MD 2. Routine L&D orders 3. Analgesia/anesthesia PRN  4. Will assess O2 needs PP after pt is calm and attempt to wean her off. Discussed w/ Dr. Vergie Living. Nothing else needs to be done for her COVID Dx at this time. Will consult w/ hospitalist PRN.   Dorathy Kinsman 07/27/2020, 2:44 PM

## 2020-07-28 LAB — RPR: RPR Ser Ql: NONREACTIVE

## 2020-07-28 NOTE — Social Work (Signed)
MOB was referred for history of depression/anxiety.   * Referral screened out by Clinical Social Worker because none of the following criteria appear to apply: ~ History of anxiety/depression during this pregnancy, or of post-partum depression following prior delivery. ~ Diagnosis of anxiety and/or depression within last 3 years OR * MOB's symptoms currently being treated with medication and/or therapy.  Please contact the Clinical Social Worker if needs arise, by MOB request, or if MOB scores greater than 9/yes to question 10 on Edinburgh Postpartum Depression Screen.  Keymari Sato, LCSWA Clinical Social Worker [facility]  

## 2020-07-28 NOTE — Progress Notes (Signed)
Post Partum Day 1 Subjective:  Patient is doing well without complaints. Ambulating without difficulty. Voiding and passing flatus. Tolerating PO. Abdominal pain improved. Vaginal bleeding decreased. Only endorses SOB with ambulation.   Objective: Blood pressure 104/68, pulse 73, temperature 98.9 F (37.2 C), temperature source Oral, resp. rate 18, last menstrual period 11/09/2019, SpO2 97 %, unknown if currently breastfeeding.  Physical Exam:  General: alert, cooperative and no distress Lochia: appropriate Uterine Fundus: firm Incision: n/a DVT Evaluation: No evidence of DVT seen on physical exam.  Recent Labs    07/27/20 0955  HGB 13.0  HCT 40.9    Assessment/Plan: PPD#1 Doing well without concerns.  -meeting post partum milestones  -bottle feeding  -undecided contraception, counseled on methods this am  #gHTN  -diagnosed since admit, no history of htn this pregnancy  -started on hctz 25 mg 9/19 for diureses in the setting of covid and BP control  -BP well controlled overnight, asymptomatic  #COVID PNA  -hospitalized on ante 9/7-9/17  -s/p remdesivir  -previously on O2, 1L O2 overnight, saturating >95%, discontinued this  Morning  -RN to notify for O2 saturation <90%  Consider discharge later today or tomorrow.    LOS: 1 day   Alric Seton 07/28/2020, 4:29 AM

## 2020-07-29 MED ORDER — ACETAMINOPHEN 500 MG PO TABS
1000.0000 mg | ORAL_TABLET | Freq: Four times a day (QID) | ORAL | 0 refills | Status: DC
Start: 2020-07-29 — End: 2021-09-30

## 2020-07-29 MED ORDER — IBUPROFEN 400 MG PO TABS: 400.0000 mg | ORAL_TABLET | Freq: Four times a day (QID) | ORAL | 0 refills | Status: DC | PRN

## 2020-07-29 MED ORDER — MEDROXYPROGESTERONE ACETATE 150 MG/ML IM SUSP
150.0000 mg | Freq: Once | INTRAMUSCULAR | Status: AC
  Administered 2020-07-29: 150 mg via INTRAMUSCULAR
  Filled 2020-07-29: qty 1

## 2020-07-29 MED ORDER — HYDROCHLOROTHIAZIDE 25 MG PO TABS: 25.0000 mg | ORAL_TABLET | Freq: Every day | ORAL | 1 refills | Status: DC

## 2020-07-29 MED ORDER — SERTRALINE HCL 50 MG PO TABS: 50.0000 mg | ORAL_TABLET | Freq: Every day | ORAL | 1 refills | Status: DC

## 2020-07-29 MED FILL — SERTRALINE HCL 50 MG TABLET: 50 | 30 days supply | Qty: 30 | Fill #0

## 2020-07-29 MED FILL — HYDROCHLOROTHIAZIDE 25 MG T: 25 | 30 days supply | Qty: 30 | Fill #0

## 2020-07-29 MED FILL — IBUPROFEN 400 MG TABS: 400 | 8 days supply | Qty: 30 | Fill #0

## 2020-07-29 MED FILL — ACETAMINOPHEN 500MG XT STRE: 500 | 4 days supply | Qty: 30 | Fill #0

## 2020-07-29 NOTE — Social Work (Signed)
CSW received consult due to score of 11 on Edinburgh Depression Screen.    CSW contacted MOB by phone due to Covid positive status. CSW introduced self and role. CSW discussed Edinburgh with MOB and asked how she is currently feeling. MOB stated she is currently feeling exhausted and that she has been experiencing some depression since the beginning of her pregnancy. CSW asked MOB how the depression looks for her, she stated isolating. CSW asked MOB if she has been on medication for the depression. MOB stated she started Zoloft and feels it is helpful some days. CSW asked MOB if she has been to therapy. MOB stated she talked to someone at her OBGYN and currently has an appointment scheduled coming up. CSW asked MOB if she is experiencing any SI or HI, she stated no. CSW asked MOB how she is feeling overall, she again stated just exhausted.   CSW provided education regarding Baby Blues vs PMADs and provided MOB with resources for mental health follow up.  CSW encouraged MOB to evaluate her mental health throughout the postpartum period with the use of the New Mom Checklist developed by Postpartum Progress as well as the Edinburgh Postnatal Depression Scale and notify a medical professional if symptoms arise. MOB stated she experienced some PPD after the birth of her last child. CSW asked how she coped with the PPD, MOB stated she just managed it until she was prescribed medication. CSW asked MOB who her supports are, she identified her mother as a support.   CSW provided review of Sudden Infant Death Syndrome (SIDS) precautions. MOB stated baby will sleep in a basinet once discharged home. MOB stated she has all the essential items needed for baby. MOB expressed she could use some more diapers. CSW asked MOB if she would like referrals to local resources such as [facility] and [facility], MOB stated yes. MOB stated baby will go to [facility] for follow-up care. MOB denies any transportation  barriers. MOB has a brand new carseat for baby.   MOB denied any additional needs or questions at this time. CSW identifies no further need for intervention and no barriers to discharge at this time.  Debrina Kizer, LCSWA Clinical Social Worker [facility]  

## 2020-07-29 NOTE — Discharge Instructions (Signed)
COVID-19 Frequently Asked Questions COVID-19 (coronavirus disease) is an infection that is caused by a large family of viruses. Some viruses cause illness in people and others cause illness in animals like camels, cats, and bats. In some cases, the viruses that cause illness in animals can spread to humans. Where did the coronavirus come from? In December 2019, [place] told the World Health Organization (WHO) of several cases of lung disease (human respiratory illness). These cases were linked to an open seafood and livestock market in the city of [place]. The link to the seafood and livestock market suggests that the virus may have spread from animals to humans. However, since that first outbreak in December, the virus has also been shown to spread from person to person. What is the name of the disease and the virus? Disease name Early on, this disease was called novel coronavirus. This is because scientists determined that the disease was caused by a new (novel) respiratory virus. The World Health Organization (WHO) has now named the disease COVID-19, or coronavirus disease. Virus name The virus that causes the disease is called severe acute respiratory syndrome coronavirus 2 (SARS-CoV-2). More information on disease and virus naming World Health Organization (WHO): www.who.int/emergencies/diseases/novel-coronavirus-2019/technical-guidance/naming-the-coronavirus-disease-(covid-2019)-and-the-virus-that-causes-it Who is at risk for complications from coronavirus disease? Some people may be at higher risk for complications from coronavirus disease. This includes older adults and people who have chronic diseases, such as heart disease, diabetes, and lung disease. If you are at higher risk for complications, take these extra precautions:  Stay home as much as possible.  Avoid social gatherings and travel.  Avoid close contact with others. Stay at least 6 ft (2 m) away from others, if possible.  Wash  your hands often with soap and water for at least 20 seconds.  Avoid touching your face, mouth, nose, or eyes.  Keep supplies on hand at home, such as food, medicine, and cleaning supplies.  If you must go out in public, wear a cloth face covering or face mask. Make sure your mask covers your nose and mouth. How does coronavirus disease spread? The virus that causes coronavirus disease spreads easily from person to person (is contagious). You may catch the virus by:  Breathing in droplets from an infected person. Droplets can be spread by a person breathing, speaking, singing, coughing, or sneezing.  Touching something, like a table or a doorknob, that was exposed to the virus (contaminated) and then touching your mouth, nose, or eyes. Can I get the virus from touching surfaces or objects? There is still a lot that we do not know about the virus that causes coronavirus disease. Scientists are basing a lot of information on what they know about similar viruses, such as:  Viruses cannot generally survive on surfaces for long. They need a human body (host) to survive.  It is more likely that the virus is spread by close contact with people who are sick (direct contact), such as through: ? Shaking hands or hugging. ? Breathing in respiratory droplets that travel through the air. Droplets can be spread by a person breathing, speaking, singing, coughing, or sneezing.  It is less likely that the virus is spread when a person touches a surface or object that has the virus on it (indirect contact). The virus may be able to enter the body if the person touches a surface or object and then touches his or her face, eyes, nose, or mouth. Can a person spread the virus without having symptoms of the disease?   It may be possible for the virus to spread before a person has symptoms of the disease, but this is most likely not the main way the virus is spreading. It is more likely for the virus to spread by  being in close contact with people who are sick and breathing in the respiratory droplets spread by a person breathing, speaking, singing, coughing, or sneezing. What are the symptoms of coronavirus disease? Symptoms vary from person to person and can range from mild to severe. Symptoms may include:  Fever or chills.  Cough.  Difficulty breathing or feeling short of breath.  Headaches, body aches, or muscle aches.  Runny or stuffy (congested) nose.  Sore throat.  New loss of taste or smell.  Nausea, vomiting, or diarrhea. These symptoms can appear anywhere from 2 to 14 days after you have been exposed to the virus. Some people may not have any symptoms. If you develop symptoms, call your health care provider. People with severe symptoms may need hospital care. Should I be tested for this virus? Your health care provider will decide whether to test you based on your symptoms, history of exposure, and your risk factors. How does a health care provider test for this virus? Health care providers will collect samples to send for testing. Samples may include:  Taking a swab of fluid from the back of your nose and throat, your nose, or your throat.  Taking fluid from the lungs by having you cough up mucus (sputum) into a sterile cup.  Taking a blood sample. Is there a treatment or vaccine for this virus? Currently, there is no vaccine to prevent coronavirus disease. Also, there are no medicines like antibiotics or antivirals to treat the virus. A person who becomes sick is given supportive care, which means rest and fluids. A person may also relieve his or her symptoms by using over-the-counter medicines that treat sneezing, coughing, and runny nose. These are the same medicines that a person takes for the common cold. If you develop symptoms, call your health care provider. People with severe symptoms may need hospital care. What can I do to protect myself and my family from this  virus?     You can protect yourself and your family by taking the same actions that you would take to prevent the spread of other viruses. Take the following actions:  Wash your hands often with soap and water for at least 20 seconds. If soap and water are not available, use alcohol-based hand sanitizer.  Avoid touching your face, mouth, nose, or eyes.  Cough or sneeze into a tissue, sleeve, or elbow. Do not cough or sneeze into your hand or the air. ? If you cough or sneeze into a tissue, throw it away immediately and wash your hands.  Disinfect objects and surfaces that you frequently touch every day.  Stay away from people who are sick.  Avoid going out in public, follow guidance from your state and local health authorities.  Avoid crowded indoor spaces. Stay at least 6 ft (2 m) away from others.  If you must go out in public, wear a cloth face covering or face mask. Make sure your mask covers your nose and mouth.  Stay home if you are sick, except to get medical care. Call your health care provider before you get medical care. Your health care provider will tell you how long to stay home.  Make sure your vaccines are up to date. Ask your health care provider what   vaccines you need. What should I do if I need to travel? Follow travel recommendations from your local health authority, the CDC, and WHO. Travel information and advice  Centers for Disease Control and Prevention (CDC): www.cdc.gov/coronavirus/2019-ncov/travelers/index.html  World Health Organization (WHO): www.who.int/emergencies/diseases/novel-coronavirus-2019/travel-advice Know the risks and take action to protect your health  You are at higher risk of getting coronavirus disease if you are traveling to areas with an outbreak or if you are exposed to travelers from areas with an outbreak.  Wash your hands often and practice good hygiene to lower the risk of catching or spreading the virus. What should I do if I  am sick? General instructions to stop the spread of infection  Wash your hands often with soap and water for at least 20 seconds. If soap and water are not available, use alcohol-based hand sanitizer.  Cough or sneeze into a tissue, sleeve, or elbow. Do not cough or sneeze into your hand or the air.  If you cough or sneeze into a tissue, throw it away immediately and wash your hands.  Stay home unless you must get medical care. Call your health care provider or local health authority before you get medical care.  Avoid public areas. Do not take public transportation, if possible.  If you can, wear a mask if you must go out of the house or if you are in close contact with someone who is not sick. Make sure your mask covers your nose and mouth. Keep your home clean  Disinfect objects and surfaces that are frequently touched every day. This may include: ? Counters and tables. ? Doorknobs and light switches. ? Sinks and faucets. ? Electronics such as phones, remote controls, keyboards, computers, and tablets.  Wash dishes in hot, soapy water or use a dishwasher. Air-dry your dishes.  Wash laundry in hot water. Prevent infecting other household members  Let healthy household members care for children and pets, if possible. If you have to care for children or pets, wash your hands often and wear a mask.  Sleep in a different bedroom or bed, if possible.  Do not share personal items, such as razors, toothbrushes, deodorant, combs, brushes, towels, and washcloths. Where to find more information Centers for Disease Control and Prevention (CDC)  Information and news updates: www.cdc.gov/coronavirus/2019-ncov World Health Organization (WHO)  Information and news updates: www.who.int/emergencies/diseases/novel-coronavirus-2019  Coronavirus health topic: www.who.int/health-topics/coronavirus  Questions and answers on COVID-19: www.who.int/news-room/q-a-detail/q-a-coronaviruses  Global  tracker: who.sprinklr.com American Academy of Pediatrics (AAP)  Information for families: www.healthychildren.org/English/health-issues/conditions/chest-lungs/Pages/2019-Novel-Coronavirus.aspx The coronavirus situation is changing rapidly. Check your local health authority website or the CDC and WHO websites for updates and news. When should I contact a health care provider?  Contact your health care provider if you have symptoms of an infection, such as fever or cough, and you: ? Have been near anyone who is known to have coronavirus disease. ? Have come into contact with a person who is suspected to have coronavirus disease. ? Have traveled to an area where there is an outbreak of COVID-19. When should I get emergency medical care?  Get help right away by calling your local emergency services (911 in the [place] if you have: ? Trouble breathing. ? Pain or pressure in your chest. ? Confusion. ? Blue-tinged lips and fingernails. ? Difficulty waking from sleep. ? Symptoms that get worse. Let the emergency medical personnel know if you think you have coronavirus disease. Summary  A new respiratory virus is spreading from person to person and causing   COVID-19 (coronavirus disease).  The virus that causes COVID-19 appears to spread easily. It spreads from one person to another through droplets from breathing, speaking, singing, coughing, or sneezing.  Older adults and those with chronic diseases are at higher risk of disease. If you are at higher risk for complications, take extra precautions.  There is currently no vaccine to prevent coronavirus disease. There are no medicines, such as antibiotics or antivirals, to treat the virus.  You can protect yourself and your family by washing your hands often, avoiding touching your face, and covering your coughs and sneezes. This information is not intended to replace advice given to you by your health care provider. Make sure you discuss any  questions you have with your health care provider. Document Revised: 08/24/2019 Document Reviewed: 02/20/2019 Elsevier Patient Education  2020 [employer]    10 Things You Can Do to Manage Your COVID-19 Symptoms at Home If you have possible or confirmed COVID-19: 1. Stay home from work and school. And stay away from other public places. If you must go out, avoid using any kind of public transportation, ridesharing, or taxis. 2. Monitor your symptoms carefully. If your symptoms get worse, call your healthcare provider immediately. 3. Get rest and stay hydrated. 4. If you have a medical appointment, call the healthcare provider ahead of time and tell them that you have or may have COVID-19. 5. For medical emergencies, call 911 and notify the dispatch personnel that you have or may have COVID-19. 6. Cover your cough and sneezes with a tissue or use the inside of your elbow. 7. Wash your hands often with soap and water for at least 20 seconds or clean your hands with an alcohol-based hand sanitizer that contains at least 60% alcohol. 8. As much as possible, stay in a specific room and away from other people in your home. Also, you should use a separate bathroom, if available. If you need to be around other people in or outside of the home, wear a mask. 9. Avoid sharing personal items with other people in your household, like dishes, towels, and bedding. 10. Clean all surfaces that are touched often, like counters, tabletops, and doorknobs. Use household cleaning sprays or wipes according to the label instructions. [employer] 05/09/2019 This information is not intended to replace advice given to you by your health care provider. Make sure you discuss any questions you have with your health care provider. Document Revised: 10/11/2019 Document Reviewed: 10/11/2019 Elsevier Patient Education  2020 [employer].   Please keep your appointment with the COVID clinic. If your breathing gets  worse (shortness of breath with rest or sitting up straight without exertion, please call the office or go to the Emergency Department).

## 2020-07-30 LAB — SURGICAL PATHOLOGY

## 2020-08-04 ENCOUNTER — Telehealth: Payer: Medicaid Other

## 2020-08-04 NOTE — Progress Notes (Signed)
Transition Care Management Unsuccessful Follow-up Telephone Call  Date of discharge and from where:  07/29/2020 from [facility]  Attempts:  3rd Attempt  Reason for unsuccessful TCM follow-up call:  No answer/busy unable to leave VMM, phone rang out to busy signal. Mychart message sent to patient to call Office with BP reading.

## 2020-08-06 ENCOUNTER — Inpatient Hospital Stay: Payer: Medicaid Other

## 2020-08-20 ENCOUNTER — Ambulatory Visit: Payer: Medicaid Other

## 2020-08-25 ENCOUNTER — Ambulatory Visit: Payer: Medicaid Other | Admitting: Advanced Practice Midwife

## 2021-01-01 IMAGING — DX DG CHEST 1V PORT
1 series · 1 of 1 positions shown · non-contrast
Comparison: 12/11/2019

CLINICAL DATA: Short of breath, congestion, rhinorrhea, pregnant

EXAM:
PORTABLE CHEST 1 VIEW

[chest ap]
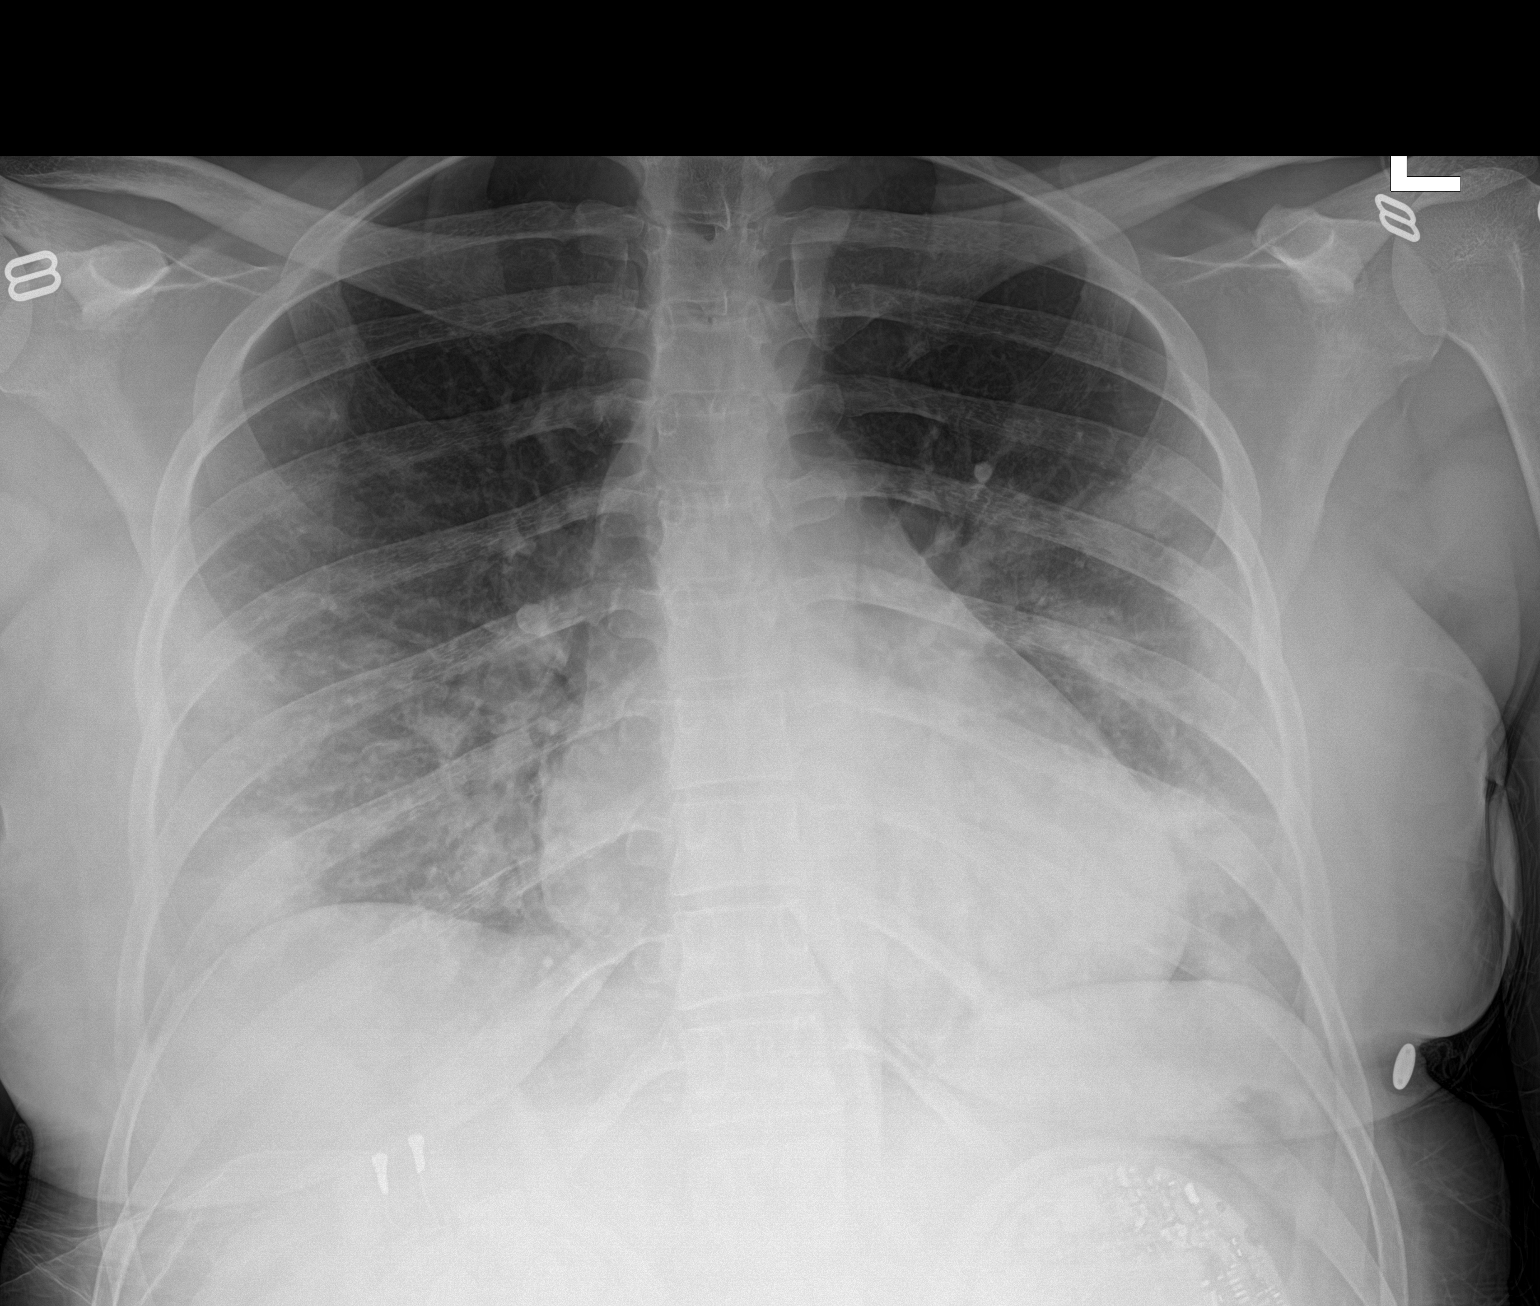

[1 of 1 positions shown; findings below may reference images not displayed]

FINDINGS: Single frontal view of the chest demonstrates multifocal bilateral
airspace disease consistent with pneumonia. No effusion or
pneumothorax. Cardiac silhouette is stable.
IMPRESSION: 1. Multifocal bilateral airspace disease, favor pneumonia over
edema. Pattern is consistent with COVID 19.

## 2021-01-02 IMAGING — CT CT ANGIO CHEST
2 of 6 series · 19 of 46 positions shown · IV contrast (omnipaque)
Comparison: None.

CLINICAL DATA: COVID symptoms. High probability for pulmonary
embolism

EXAM:
CT ANGIOGRAPHY CHEST WITH CONTRAST
TECHNIQUE: Multidetector CT imaging of the chest was performed using the
standard protocol during bolus administration of intravenous
contrast. Multiplanar CT image reconstructions and MIPs were
obtained to evaluate the vascular anatomy.
CONTRAST:  75mL OMNIPAQUE IOHEXOL 350 MG/ML SOLN

[Series 6: thins · axial · 0.65mm/px · z∈[+1206,+1466]mm · 16 of 287 slices shown]
[im 13/287  lung]
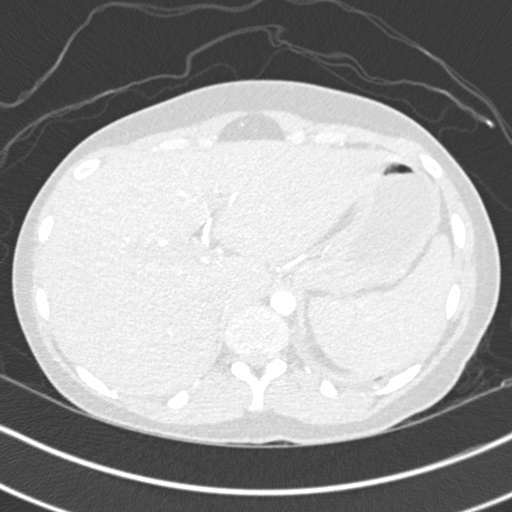
[im 38/287  soft-tissue]
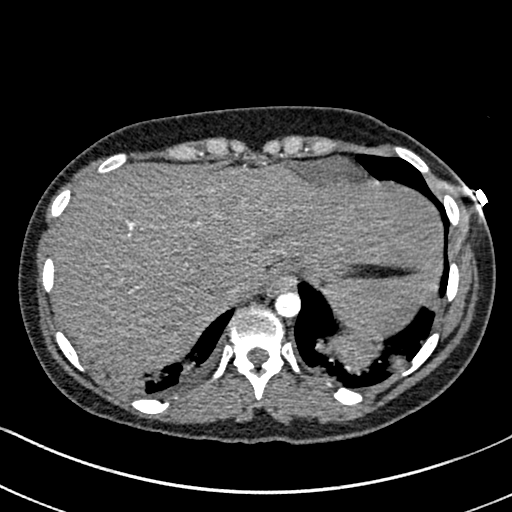
[im 50/287  lung]
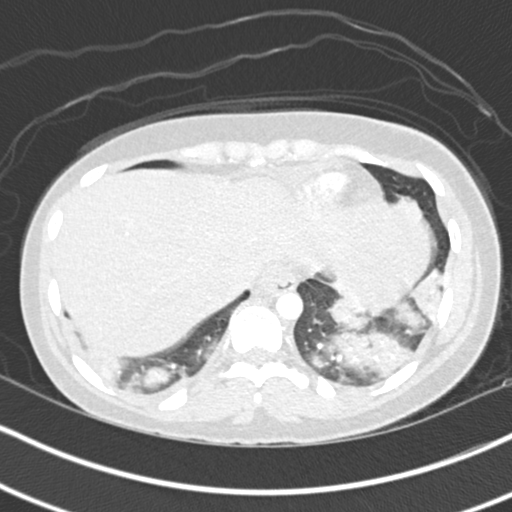
[im 63/287  soft-tissue]
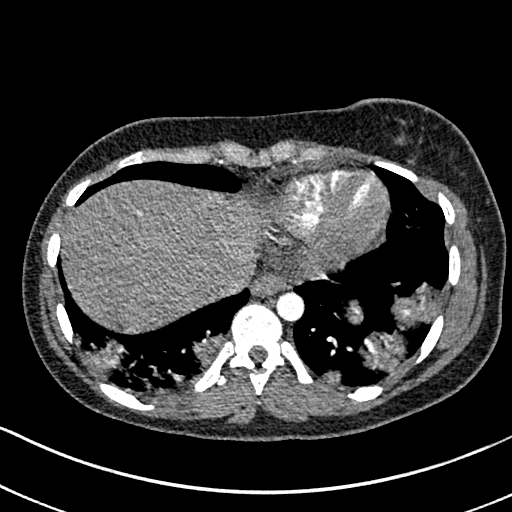
[im 88/287  lung]
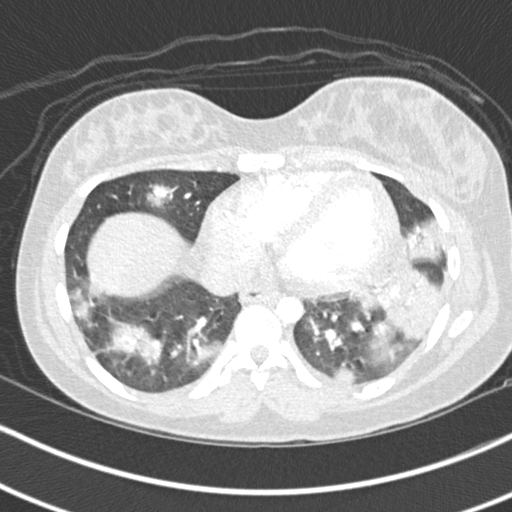
[im 100/287  soft-tissue]
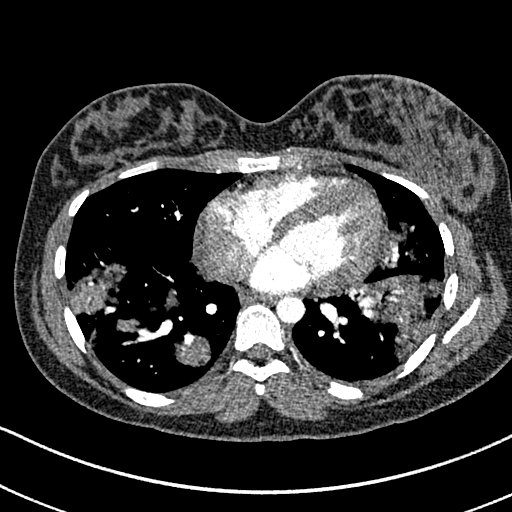
[im 112/287  lung]
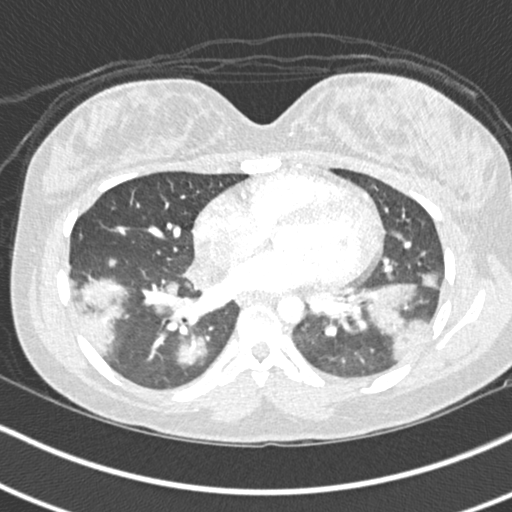
[im 137/287  soft-tissue]
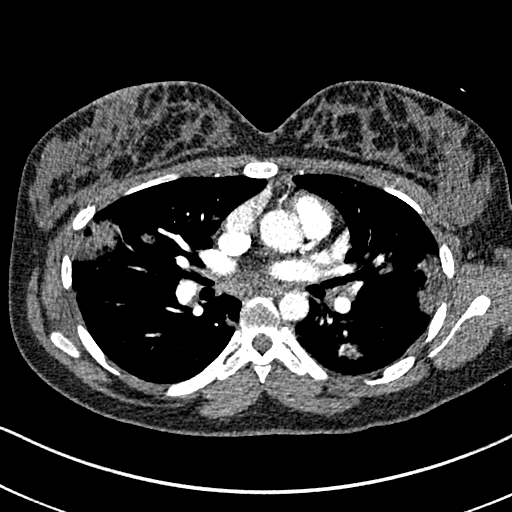
[im 150/287  lung]
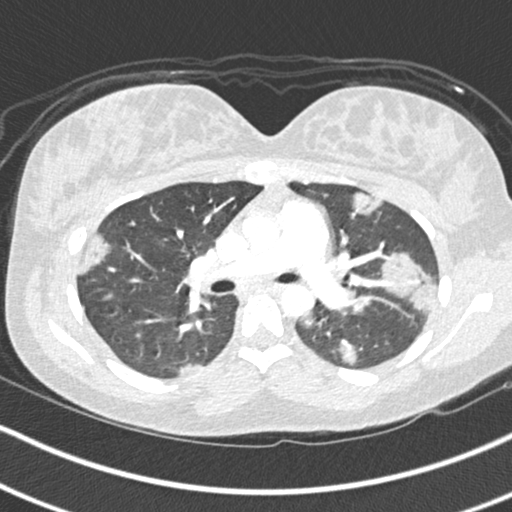
[im 175/287  soft-tissue]
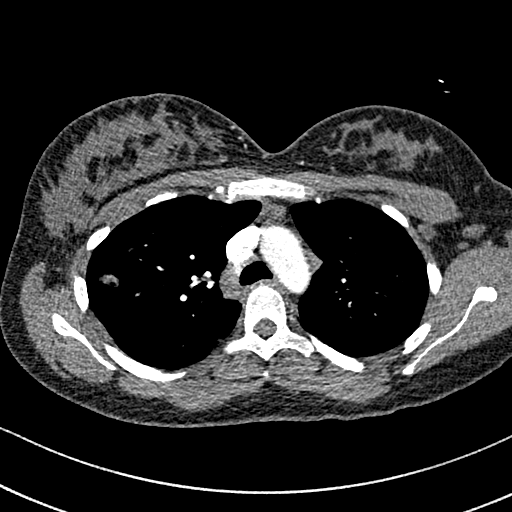
[im 187/287  lung]
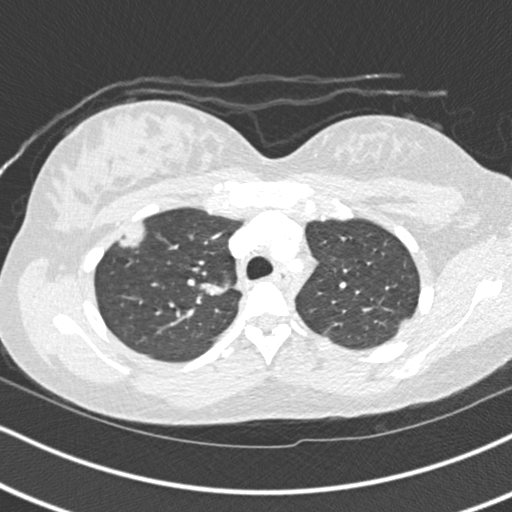
[im 199/287  soft-tissue]
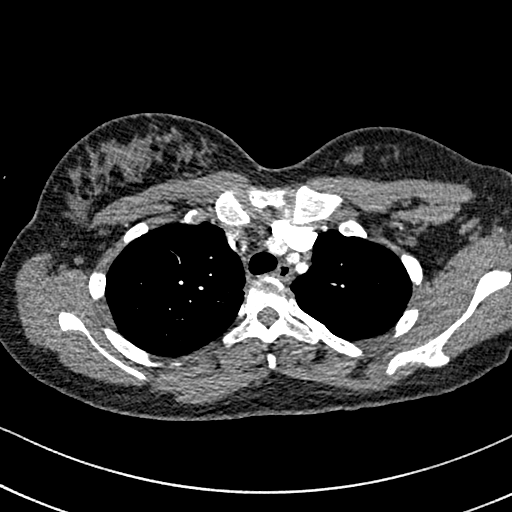
[im 224/287  lung]
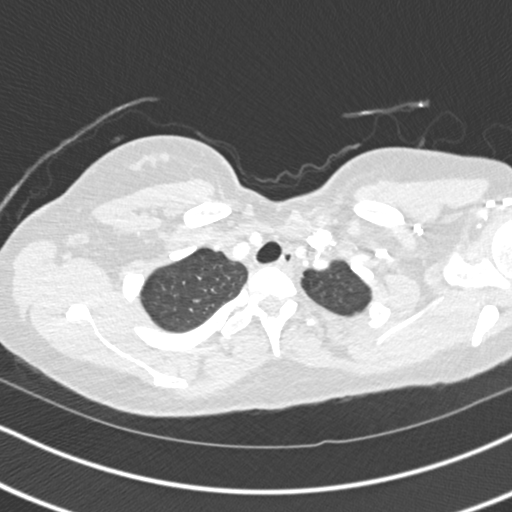
[im 237/287  soft-tissue]
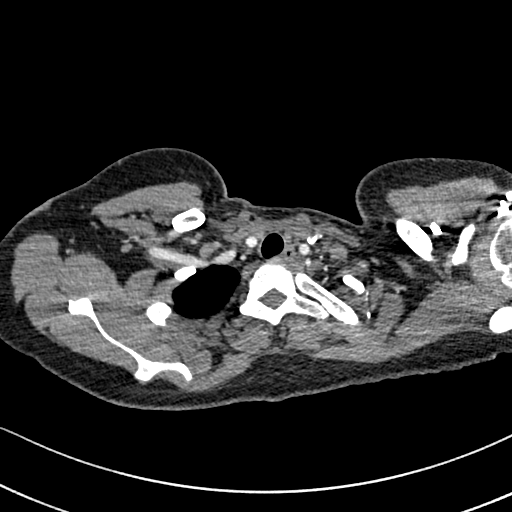
[im 249/287  lung]
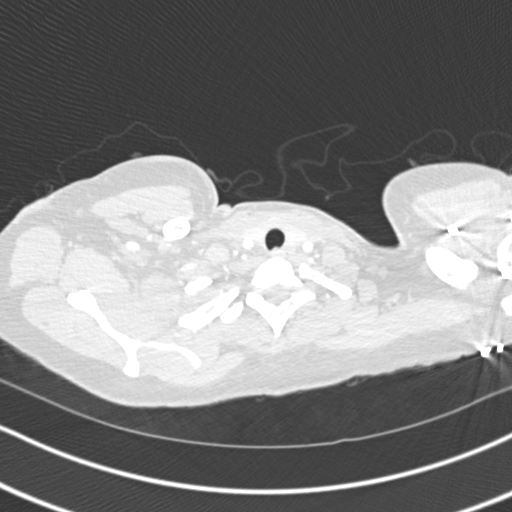
[im 274/287  soft-tissue]
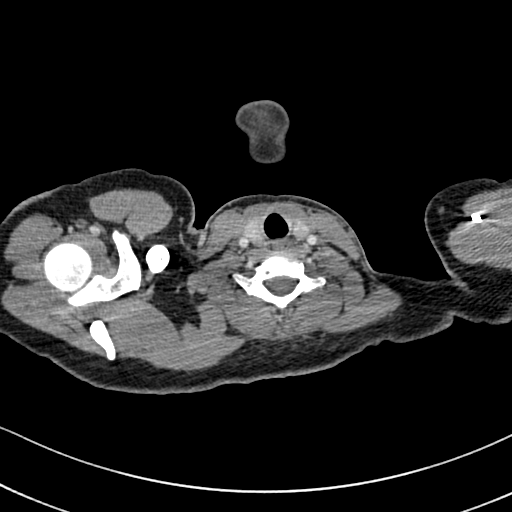

[Series 8: coronal mpr · coronal · 0.59mm/px · 3 of 115 slices shown]
[im 29/115  soft-tissue]
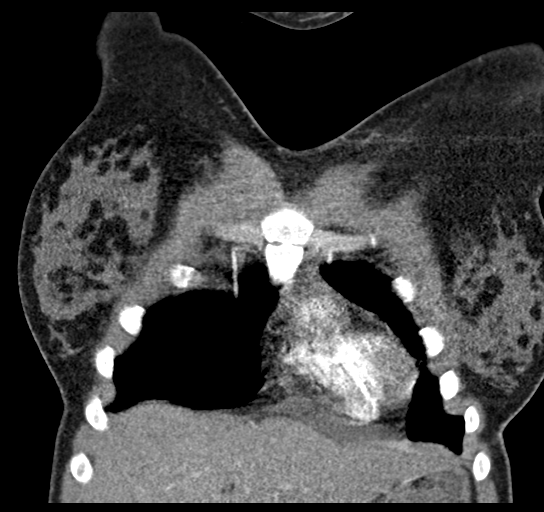
[im 58/115  soft-tissue]
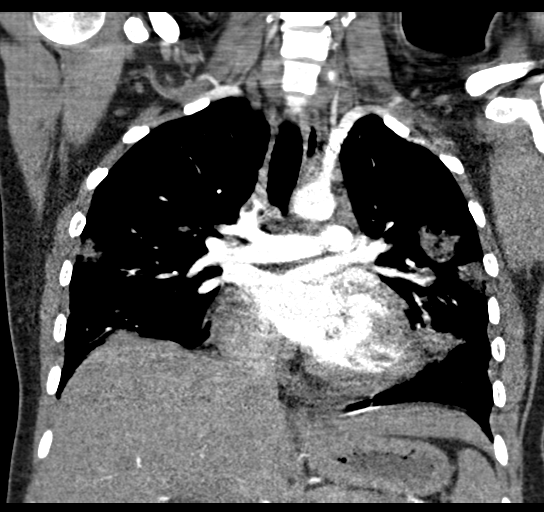
[im 86/115  soft-tissue]
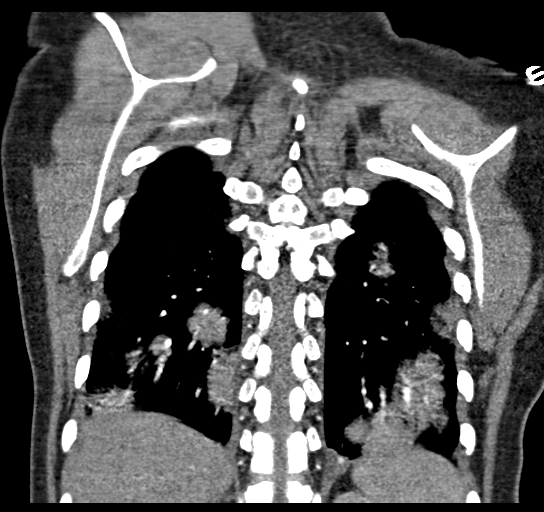

[19 of 46 positions shown; findings below may reference images not displayed]

FINDINGS: Cardiovascular: Normal heart size. No pericardial effusion. No
pulmonary artery filling defects. Normal aorta.

Mediastinum/Nodes: Negative for adenopathy or mass

Lungs/Pleura: Ground-glass and consolidative opacities throughout
the bilateral lungs. There is known ULFAU-SK infection. No edema or
effusion.

Upper Abdomen: Negative

Musculoskeletal: Negative

Review of the MIP images confirms the above findings.
IMPRESSION: 1. ULFAU-SK with multifocal pneumonia.
2. Negative for pulmonary embolism.

## 2021-04-10 ENCOUNTER — Inpatient Hospital Stay (HOSPITAL_COMMUNITY)
Admission: AD | Admit: 2021-04-10 | Discharge: 2021-04-10 | Disposition: A | Discharge status: Home / Self Care | Payer: Managed Care, Other (non HMO) | Attending: Obstetrics and Gynecology | Admitting: Obstetrics and Gynecology

## 2021-04-10 ENCOUNTER — Inpatient Hospital Stay (HOSPITAL_COMMUNITY): Payer: Managed Care, Other (non HMO)

## 2021-04-10 ENCOUNTER — Other Ambulatory Visit: Payer: Self-pay

## 2021-04-10 ENCOUNTER — Encounter (HOSPITAL_COMMUNITY): Payer: Self-pay | Admitting: Obstetrics and Gynecology

## 2021-04-10 DIAGNOSIS — O209 Hemorrhage in early pregnancy, unspecified: Secondary | ICD-10-CM | POA: Diagnosis not present

## 2021-04-10 DIAGNOSIS — Z3A01 Less than 8 weeks gestation of pregnancy: Secondary | ICD-10-CM | POA: Diagnosis not present

## 2021-04-10 DIAGNOSIS — Z3201 Encounter for pregnancy test, result positive: Secondary | ICD-10-CM | POA: Insufficient documentation

## 2021-04-10 DIAGNOSIS — R1030 Lower abdominal pain, unspecified: Secondary | ICD-10-CM | POA: Insufficient documentation

## 2021-04-10 DIAGNOSIS — O26891 Other specified pregnancy related conditions, first trimester: Secondary | ICD-10-CM | POA: Diagnosis not present

## 2021-04-10 DIAGNOSIS — Z79899 Other long term (current) drug therapy: Secondary | ICD-10-CM | POA: Diagnosis not present

## 2021-04-10 DIAGNOSIS — Z3490 Encounter for supervision of normal pregnancy, unspecified, unspecified trimester: Secondary | ICD-10-CM

## 2021-04-10 DIAGNOSIS — O4691 Antepartum hemorrhage, unspecified, first trimester: Secondary | ICD-10-CM

## 2021-04-10 LAB — WET PREP, GENITAL
Clue Cells Wet Prep HPF POC: NONE SEEN
Sperm: NONE SEEN
Trich, Wet Prep: NONE SEEN
Yeast Wet Prep HPF POC: NONE SEEN

## 2021-04-10 LAB — URINALYSIS, ROUTINE W REFLEX MICROSCOPIC
Bilirubin Urine: NEGATIVE
Glucose, UA: NEGATIVE mg/dL
Hgb urine dipstick: NEGATIVE
Ketones, ur: NEGATIVE mg/dL
Leukocytes,Ua: NEGATIVE
Nitrite: NEGATIVE
Protein, ur: NEGATIVE mg/dL
Specific Gravity, Urine: 1.028 (ref 1.005–1.030)
pH: 6 (ref 5.0–8.0)

## 2021-04-10 LAB — CBC
HCT: 39.9 % (ref 36.0–46.0)
Hemoglobin: 13.4 g/dL (ref 12.0–15.0)
MCH: 32.4 pg (ref 26.0–34.0)
MCHC: 33.6 g/dL (ref 30.0–36.0)
MCV: 96.6 fL (ref 80.0–100.0)
Platelets: 299 10*3/uL (ref 150–400)
RBC: 4.13 MIL/uL (ref 3.87–5.11)
RDW: 12.1 % (ref 11.5–15.5)
WBC: 8.8 10*3/uL (ref 4.0–10.5)
nRBC: 0 % (ref 0.0–0.2)

## 2021-04-10 LAB — HCG, QUANTITATIVE, PREGNANCY: hCG, Beta Chain, Quant, S: 105170 m[IU]/mL — ABNORMAL HIGH (ref <5)

## 2021-04-10 LAB — POCT PREGNANCY, URINE: Preg Test, Ur: POSITIVE — AB

## 2021-04-10 NOTE — Discharge Instructions (Signed)
Safe Medications in Pregnancy   Acne: Benzoyl Peroxide Salicylic Acid  Backache/Headache: Tylenol: 2 regular strength every 4 hours OR              2 Extra strength every 6 hours  Colds/Coughs/Allergies: Benadryl (alcohol free) 25 mg every 6 hours as needed Breath right strips Claritin Cepacol throat lozenges Chloraseptic throat spray Cold-Eeze- up to three times per day Cough drops, alcohol free Flonase (by prescription only) Guaifenesin Mucinex Robitussin DM (plain only, alcohol free) Saline nasal spray/drops Sudafed (pseudoephedrine) & Actifed ** use only after [redacted] weeks gestation and if you do not have high blood pressure Tylenol Vicks Vaporub Zinc lozenges Zyrtec   Constipation: Colace Ducolax suppositories Fleet enema Glycerin suppositories Metamucil Milk of magnesia Miralax Senokot Smooth move tea  Diarrhea: Kaopectate Imodium A-D  *NO pepto Bismol  Hemorrhoids: Anusol Anusol HC Preparation H Tucks  Indigestion: Tums Maalox Mylanta Zantac  Pepcid  Insomnia: Benadryl (alcohol free) 25mg every 6 hours as needed Tylenol PM Unisom, no Gelcaps  Leg Cramps: Tums MagGel  Nausea/Vomiting:  Bonine Dramamine Emetrol Ginger extract Sea bands Meclizine  Nausea medication to take during pregnancy:  Unisom (doxylamine succinate 25 mg tablets) Take one tablet daily at bedtime. If symptoms are not adequately controlled, the dose can be increased to a maximum recommended dose of two tablets daily (1/2 tablet in the morning, 1/2 tablet mid-afternoon and one at bedtime). Vitamin B6 100mg tablets. Take one tablet twice a day (up to 200 mg per day).  Skin Rashes: Aveeno products Benadryl cream or 25mg every 6 hours as needed Calamine Lotion 1% cortisone cream  Yeast infection: Gyne-lotrimin 7 Monistat 7   **If taking multiple medications, please check labels to avoid duplicating the same active ingredients **take medication as directed on  the label ** Do not exceed 4000 mg of tylenol in 24 hours **Do not take medications that contain aspirin or ibuprofen    Obstetrics: Normal and Problem Pregnancies (7th ed., pp. 102-121). [place], [place]: Elsevier."> Textbook of Family Medicine (9th ed., pp. 365-410). [place], [place]: [employer].">  First Trimester of Pregnancy  The first trimester of pregnancy starts on the first day of your last menstrual period until the end of week 12. This is months 1 through 3 of pregnancy. A week after a sperm fertilizes an egg, the egg will implant into the wall of the uterus and begin to develop into a baby. By the end of 12 weeks, all the baby's organs will be formed and the baby will be 2-3 inches in size. Body changes during your first trimester Your body goes through many changes during pregnancy. The changes vary and generally return to normal after your baby is born. Physical changes  You may gain or lose weight.  Your breasts may begin to grow larger and become tender. The tissue that surrounds your nipples (areola) may become darker.  Dark spots or blotches (chloasma or mask of pregnancy) may develop on your face.  You may have changes in your hair. These can include thickening or thinning of your hair or changes in texture. Health changes  You may feel nauseous, and you may vomit.  You may have heartburn.  You may develop headaches.  You may develop constipation.  Your gums may bleed and may be sensitive to brushing and flossing. Other changes  You may tire easily.  You may urinate more often.  Your menstrual periods will stop.  You may have a loss of appetite.  You may develop   cravings for certain kinds of food.  You may have changes in your emotions from day to day.  You may have more vivid and strange dreams. Follow these instructions at home: Medicines  Follow your health care provider's instructions regarding medicine use. Specific medicines may be  either safe or unsafe to take during pregnancy. Do not take any medicines unless told to by your health care provider.  Take a prenatal vitamin that contains at least 600 micrograms (mcg) of folic acid. Eating and drinking  Eat a healthy diet that includes fresh fruits and vegetables, whole grains, good sources of protein such as meat, eggs, or tofu, and low-fat dairy products.  Avoid raw meat and unpasteurized juice, milk, and cheese. These carry germs that can harm you and your baby.  If you feel nauseous or you vomit: ? Eat 4 or 5 small meals a day instead of 3 large meals. ? Try eating a few soda crackers. ? Drink liquids between meals instead of during meals.  You may need to take these actions to prevent or treat constipation: ? Drink enough fluid to keep your urine pale yellow. ? Eat foods that are high in fiber, such as beans, whole grains, and fresh fruits and vegetables. ? Limit foods that are high in fat and processed sugars, such as fried or sweet foods. Activity  Exercise only as directed by your health care provider. Most people can continue their usual exercise routine during pregnancy. Try to exercise for 30 minutes at least 5 days a week.  Stop exercising if you develop pain or cramping in the lower abdomen or lower back.  Avoid exercising if it is very hot or humid or if you are at high altitude.  Avoid heavy lifting.  If you choose to, you may have sex unless your health care provider tells you not to. Relieving pain and discomfort  Wear a good support bra to relieve breast tenderness.  Rest with your legs elevated if you have leg cramps or low back pain.  If you develop bulging veins (varicose veins) in your legs: ? Wear support hose as told by your health care provider. ? Elevate your feet for 15 minutes, 3-4 times a day. ? Limit salt in your diet. Safety  Wear your seat belt at all times when driving or riding in a car.  Talk with your health care  provider if someone is verbally or physically abusive to you.  Talk with your health care provider if you are feeling sad or have thoughts of hurting yourself. Lifestyle  Do not use hot tubs, steam rooms, or saunas.  Do not douche. Do not use tampons or scented sanitary pads.  Do not use herbal remedies, alcohol, illegal drugs, or medicines that are not approved by your health care provider. Chemicals in these products can harm your baby.  Do not use any products that contain nicotine or tobacco, such as cigarettes, e-cigarettes, and chewing tobacco. If you need help quitting, ask your health care provider.  Avoid cat litter boxes and soil used by cats. These carry germs that can cause birth defects in the baby and possibly loss of the unborn baby (fetus) by miscarriage or stillbirth. General instructions  During routine prenatal visits in the first trimester, your health care provider will do a physical exam, perform necessary tests, and ask you how things are going. Keep all follow-up visits. This is important.  Ask for help if you have counseling or nutritional needs during pregnancy. Your   health care provider can offer advice or refer you to specialists for help with various needs.  Schedule a dentist appointment. At home, brush your teeth with a soft toothbrush. Floss gently.  Write down your questions. Take them to your prenatal visits. Where to find more information  American Pregnancy Association: americanpregnancy.org  American College of Obstetricians and Gynecologists: acog.org/en/Womens%20Health/Pregnancy  Office on Women's Health: womenshealth.gov/pregnancy Contact a health care provider if you have:  Dizziness.  A fever.  Mild pelvic cramps, pelvic pressure, or nagging pain in the abdominal area.  Nausea, vomiting, or diarrhea that lasts for 24 hours or longer.  A bad-smelling vaginal discharge.  Pain when you urinate.  Known exposure to a contagious illness,  such as chickenpox, measles, Zika virus, HIV, or hepatitis. Get help right away if you have:  Spotting or bleeding from your vagina.  Severe abdominal cramping or pain.  Shortness of breath or chest pain.  Any kind of trauma, such as from a fall or a car crash.  New or increased pain, swelling, or redness in an arm or leg. Summary  The first trimester of pregnancy starts on the first day of your last menstrual period until the end of week 12 (months 1 through 3).  Eating 4 or 5 small meals a day rather than 3 large meals may help to relieve nausea and vomiting.  Do not use any products that contain nicotine or tobacco, such as cigarettes, e-cigarettes, and chewing tobacco. If you need help quitting, ask your health care provider.  Keep all follow-up visits. This is important. This information is not intended to replace advice given to you by your health care provider. Make sure you discuss any questions you have with your health care provider. Document Revised: 04/02/2020 Document Reviewed: 02/07/2020 Elsevier Patient Education  2021 [employer]vier Inc.  

## 2021-04-10 NOTE — MAU Note (Signed)
Thinks she has a urinary tract infection. For the past wk, she has had had spotting everyday, not wearing a pad, but sees when she wipes or in her panties.  Has been having some cramping in lower abd.  Didn't think pregnancy.  Neg HPT 2 days before Mother's Day, has not had a period since then or retested.

## 2021-04-10 NOTE — MAU Provider Note (Signed)
Chief Complaint:  Vaginal Bleeding and Possible Pregnancy   HPI: Terry [name] is a 25 y.o. [id number] at unknown gestation who presents to maternity admissions reporting vaginal spotting and lower abdominal cramping. Patient reports 2 days ago she started having spotting only when wiping as well as some lower abdominal cramping. She initially thought she was developing a UTI. She denies burning/pain, frequency or urgency with urination. Unsure of LMP as she had a Depo shot back in September and has not had a period since prior to pregnancy last year. She has not taken a UPT, but reports she had a negative UPT 2 days prior to Mother's Day.   Pregnancy Course:   Past Medical History:  Diagnosis Date  . Chlamydia   . Depression   . Gonorrhea    OB History  Gravida Para Term Preterm AB Living  _0 SAB IAB Ectopic Multiple Live Births  1 0   0 2    # Outcome Date GA Lbr Len/2nd Weight Sex Delivery Anes PTL Lv  4 Current           3 Term 07/27/20 [id number] / 00:16 3580 g F Vag-Spont None  LIV  2 SAB 07/2019          1 Term 12/28/16 [id number]:57 / 02:31 3830 g F Vag-Spont EPI  LIV   Past Surgical History:  Procedure Laterality Date  . NO PAST SURGERIES     Family History  Problem Relation Age of Onset  . Cancer Maternal Grandmother        pancreatic  . Diabetes Maternal Uncle   . Diabetes Mother   . Hypertension Mother   . Diabetes Father    Social History   Tobacco Use  . Smoking status: Never Smoker  . Smokeless tobacco: Never Used  Vaping Use  . Vaping Use: Never used  Substance Use Topics  . Alcohol use: No  . Drug use: No   No Known Allergies Medications Prior to Admission  Medication Sig Dispense Refill Last Dose  . acetaminophen (TYLENOL) 500 MG tablet Take 2 tablets (1,000 mg total) by mouth every 6 (six) hours. 30 tablet 0   . Blood Pressure Monitoring (BLOOD PRESSURE CUFF) MISC 1 Device by Does not apply route once a week. 1 each 0   . Blood Pressure  Monitoring (BLOOD PRESSURE KIT) DEVI 1 kit by Does not apply route once a week. Check Blood Pressure regularly and record readings into the Babyscripts App.  Large Cuff.  DX O90.0 1 each 0   . Elastic Bandages & Supports MISC 1 Device by Does not apply route daily as needed. 1 Device 0   . ferrous sulfate 325 (65 FE) MG tablet Take 1 tablet (325 mg total) by mouth 2 (two) times daily with a meal. 60 tablet 5   . guaiFENesin (MUCINEX) 600 MG 12 hr tablet Take 1 tablet (600 mg total) by mouth 2 (two) times daily. 30 tablet 1   . hydrochlorothiazide (HYDRODIURIL) 25 MG tablet Take 1 tablet (25 mg total) by mouth daily. 30 tablet 1   . ibuprofen (ADVIL) 400 MG tablet Take 1 tablet (400 mg total) by mouth every 6 (six) hours as needed for moderate pain or cramping (If not controlled with tylenol). 30 tablet 0   . Prenatal Vit-Fe Fumarate-FA (PRENATAL COMPLETE) 14-0.4 MG TABS Take 1 tablet by mouth daily. 60 tablet 0   . Prenatal Vit-Fe Fumarate-FA (PRENATAL VITAMINS PO)  Take by mouth.      . sertraline (ZOLOFT) 50 MG tablet Take 1 tablet (50 mg total) by mouth daily. 30 tablet 1     I have reviewed patient's Past Medical Hx, Surgical Hx, Family Hx, Social Hx, medications and allergies.   ROS:  Review of Systems  Constitutional: Negative.   Respiratory: Negative.   Cardiovascular: Negative.   Gastrointestinal: Negative.   Genitourinary: Positive for pelvic pain and vaginal bleeding.       Vaginal spotting  Musculoskeletal: Negative.   Neurological: Negative.   Psychiatric/Behavioral: Negative.     Physical Exam   Patient Vitals for the past 24 hrs:  BP Temp Temp src Pulse Resp SpO2 Height Weight  04/10/21 1352 122/71 -- -- 80 19 99 % -- --  04/10/21 1328 116/65 98.9 F (37.2 C) Oral 82 18 99 % _0  (1.778 m) 88.1 kg   Constitutional: well-developed, well-nourished female in no acute distress.  Cardiovascular: normal rate Respiratory: normal effort GI: abd soft, non-tender MS:  extremities nontender, no edema, normal ROM Neurologic: alert and oriented x 4.  GU: Neg CVAT. Pelvic: NEFG, small folliculitis on right external labia non-tender to touch, physiologic discharge, no blood, cervix without lesions/masses, appears visually closed     Labs: Results for orders placed or performed during the hospital encounter of 04/10/21 (from the past 24 hour(s))  Urinalysis, Routine w reflex microscopic Urine, Clean Catch     Status: None   Collection Time: 04/10/21  1:33 PM  Result Value Ref Range   Color, Urine YELLOW YELLOW   APPearance CLEAR CLEAR   Specific Gravity, Urine 1.028 1.005 - 1.030   pH 6.0 5.0 - 8.0   Glucose, UA NEGATIVE NEGATIVE mg/dL   Hgb urine dipstick NEGATIVE NEGATIVE   Bilirubin Urine NEGATIVE NEGATIVE   Ketones, ur NEGATIVE NEGATIVE mg/dL   Protein, ur NEGATIVE NEGATIVE mg/dL   Nitrite NEGATIVE NEGATIVE   Leukocytes,Ua NEGATIVE NEGATIVE  Pregnancy, urine POC     Status: Abnormal   Collection Time: 04/10/21  1:35 PM  Result Value Ref Range   Preg Test, Ur POSITIVE (A) NEGATIVE  CBC     Status: None   Collection Time: 04/10/21  2:08 PM  Result Value Ref Range   WBC 8.8 4.0 - 10.5 K/uL   RBC 4.13 3.87 - 5.11 MIL/uL   Hemoglobin 13.4 12.0 - 15.0 g/dL   HCT 39.9 36.0 - 46.0 %   MCV 96.6 80.0 - 100.0 fL   MCH 32.4 26.0 - 34.0 pg   MCHC 33.6 30.0 - 36.0 g/dL   RDW 12.1 11.5 - 15.5 %   Platelets 299 150 - 400 K/uL   nRBC 0.0 0.0 - 0.2 %  Wet prep, genital     Status: Abnormal   Collection Time: 04/10/21  2:17 PM   Specimen: PATH Cytology Cervicovaginal Ancillary Only  Result Value Ref Range   Yeast Wet Prep HPF POC NONE SEEN NONE SEEN   Trich, Wet Prep NONE SEEN NONE SEEN   Clue Cells Wet Prep HPF POC NONE SEEN NONE SEEN   WBC, Wet Prep HPF POC MANY (A) NONE SEEN   Sperm NONE SEEN     Imaging:  US OB Comp Less 14 Wks  Result Date: 04/10/2021 CLINICAL DATA:  Vaginal bleeding, first trimester of pregnancy. EXAM: OBSTETRIC <14 WK  ULTRASOUND TECHNIQUE: Transabdominal ultrasound was performed for evaluation of the gestation as well as the maternal uterus and adnexal regions. COMPARISON:  None. FINDINGS: Intrauterine  gestational sac: Single Yolk sac:  Visualized. Embryo:  Visualized. Cardiac Activity: Visualized. Heart Rate: 153 bpm CRL:   14 mm   7 w 5 d                  [place] EDC: November 22, 2021. Subchorionic hemorrhage:  None visualized. Maternal uterus/adnexae: Ovaries are unremarkable. No free fluid is noted. IMPRESSION: Single live intrauterine gestation of 7 weeks 5 days. Electronically Signed   By: Marijo Conception M.D.   On: 04/10/2021 14:53    MAU Course: Orders Placed This Encounter  Procedures  . Wet prep, genital  . US OB Comp Less 14 Wks  . Urinalysis, Routine w reflex microscopic Urine, Clean Catch  . CBC  . hCG, quantitative, pregnancy  . Pregnancy, urine POC  . Discharge patient   No orders of the defined types were placed in this encounter.   MDM: UA unremarkable CBC HCG Blood type B pos, Rhogam not indicated Wet prep, GC/CT [place] with results as above. IUP at 7.5wks with FHR 153bpm  Assessment: 1. [redacted] weeks gestation of pregnancy   2. Vaginal bleeding affecting early pregnancy   3. Intrauterine pregnancy     Plan: Discharge home in stable condition  Schedule appointment at [facility] to establish prenatal care Return to MAU as needed for emergencies    Allergies as of 04/10/2021   No Known Allergies     Medication List    STOP taking these medications   guaiFENesin 600 MG 12 hr tablet Commonly known as: MUCINEX   hydrochlorothiazide 25 MG tablet Commonly known as: HYDRODIURIL   ibuprofen 400 MG tablet Commonly known as: ADVIL     TAKE these medications   acetaminophen 500 MG tablet Commonly known as: TYLENOL Take 2 tablets (1,000 mg total) by mouth every 6 (six) hours.   Blood Pressure Cuff Misc 1 Device by Does not apply route once a week.   Blood Pressure Kit Devi 1 kit by  Does not apply route once a week. Check Blood Pressure regularly and record readings into the Babyscripts App.  Large Cuff.  DX O90.0   Elastic Bandages & Supports Misc 1 Device by Does not apply route daily as needed.   ferrous sulfate 325 (65 FE) MG tablet Take 1 tablet (325 mg total) by mouth 2 (two) times daily with a meal.   PRENATAL VITAMINS PO Take by mouth.   Prenatal Complete 14-0.4 MG Tabs Take 1 tablet by mouth daily.   sertraline 50 MG tablet Commonly known as: ZOLOFT Take 1 tablet (50 mg total) by mouth daily.       Maryagnes Amos, MSN, CNM 04/10/2021 3:19 PM

## 2021-04-13 LAB — GC/CHLAMYDIA PROBE AMP (~~LOC~~) NOT AT ARMC
Chlamydia: NEGATIVE
Comment: NEGATIVE
Comment: NORMAL
Neisseria Gonorrhea: NEGATIVE

## 2021-06-03 ENCOUNTER — Other Ambulatory Visit: Payer: Self-pay

## 2021-06-03 ENCOUNTER — Encounter (HOSPITAL_COMMUNITY): Payer: Self-pay | Admitting: Emergency Medicine

## 2021-06-03 ENCOUNTER — Ambulatory Visit (HOSPITAL_COMMUNITY)
Admission: EM | Admit: 2021-06-03 | Discharge: 2021-06-03 | Disposition: A | Discharge status: Home / Self Care | Payer: Medicaid Other | Attending: Emergency Medicine | Admitting: Emergency Medicine

## 2021-06-03 DIAGNOSIS — M25531 Pain in right wrist: Secondary | ICD-10-CM | POA: Diagnosis not present

## 2021-06-03 DIAGNOSIS — M779 Enthesopathy, unspecified: Secondary | ICD-10-CM

## 2021-06-03 MED ORDER — PREDNISONE 10 MG (21) PO TBPK: ORAL_TABLET | Freq: Every day | ORAL | 0 refills | Status: DC

## 2021-06-03 NOTE — ED Notes (Signed)
Patient is on the phone with insurance and has many questions.  Asked christian, patient access to speak to patient in treatment room.  Patient was asking this nurse many questions about insurance not familiar with.

## 2021-06-03 NOTE — ED Triage Notes (Signed)
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  This has been for 2 weeks.  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  Patient is right handed  Patient has form from work related to capabilities as well

## 2021-06-03 NOTE — ED Provider Notes (Signed)
[place]    CSN: [id number] Arrival date & time: 06/03/21  9892      History   Chief Complaint Chief Complaint  Patient presents with   Wrist Pain    HPI Terry [name] is a 25 y.o. female.   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 Was seen by work Marine scientist and was taking tylenol and biofreeze with no relief. She also began to wear a ace to wrist with some relief. Denies any injury. Pain is more so with the grasp of the knife to cut the chicken intermit near the palm of hand and thumb area. . Denies any hx of neck problems. No elbow pain.    Past Medical History:  Diagnosis Date   Chlamydia    Depression    Gonorrhea     Patient Active Problem List   Diagnosis Date Noted   Indication for care in labor and delivery, antepartum 07/27/2020   Vaginal delivery 07/27/2020   COVID-19 affecting pregnancy in third trimester 07/18/2020   Pneumonia due to COVID-19 virus 07/16/2020   History of prior pregnancy with short cervix, currently pregnant 02/25/2020   Depression affecting pregnancy 01/29/2020   Supervision of other normal pregnancy, antepartum 01/18/2020    Past Surgical History:  Procedure Laterality Date   NO PAST SURGERIES      OB History     Gravida  4   Para  2   Term  2   Preterm      AB  1   Living  2      SAB  1   IAB  0   Ectopic      Multiple  0   Live Births  2            Home Medications    Prior to Admission medications   Medication Sig Start Date End Date Taking? Authorizing Provider  predniSONE (STERAPRED UNI-PAK 21 TAB) 10 MG (21) TBPK tablet Take by mouth daily. Take 6 tabs by mouth daily  for 2 days, then 5 tabs for 2 days, then 4 tabs for 2 days, then 3 tabs for 2 days, 2 tabs for 2 days, then 1 tab by mouth daily for 2 days 06/03/21  Yes Marney Setting, NP  acetaminophen (TYLENOL) 500 MG tablet Take 2 tablets (1,000 mg total) by mouth every 6  (six) hours. 07/29/20   Sparacino, Hailey L, DO  Blood Pressure Monitoring (BLOOD PRESSURE CUFF) MISC 1 Device by Does not apply route once a week. 01/29/20   FairMarin Shutter, MD  Blood Pressure Monitoring (BLOOD PRESSURE KIT) DEVI 1 kit by Does not apply route once a week. Check Blood Pressure regularly and record readings into the Babyscripts App.  Large Cuff.  DX O90.0 02/25/20   Leftwich-Kirby, Kathie Dike, CNM  Elastic Bandages & Supports MISC 1 Device by Does not apply route daily as needed. 06/09/20   [name] Basil, MD  ferrous sulfate 325 (65 FE) MG tablet Take 1 tablet (325 mg total) by mouth 2 (two) times daily with a meal. 05/20/20   Shelly Bombard, MD  Prenatal Vit-Fe Fumarate-FA (PRENATAL COMPLETE) 14-0.4 MG TABS Take 1 tablet by mouth daily. 07/11/19   Khatri, Nicanor Alcon, PA-C  Prenatal Vit-Fe Fumarate-FA (PRENATAL VITAMINS PO) Take by mouth.     [provider]  sertraline (ZOLOFT) 50 MG tablet Take 1 tablet (50 mg total) by mouth  daily. Patient not taking: Reported on 04/10/2021 07/29/20 08/28/20  Merilyn Baba, DO    Family History Family History  Problem Relation Age of Onset   Cancer Maternal Grandmother        pancreatic   Diabetes Maternal Uncle    Diabetes Mother    Hypertension Mother    Diabetes Father     Social History Social History   Tobacco Use   Smoking status: Never   Smokeless tobacco: Never  Vaping Use   Vaping Use: Never used  Substance Use Topics   Alcohol use: No   Drug use: No     Allergies   Patient has no known allergies.   Review of Systems Review of Systems  Constitutional: Negative.   Respiratory: Negative.    Cardiovascular: Negative.   Gastrointestinal: Negative.   Musculoskeletal:  Positive for joint swelling.       Rt wrist pain with grasp , decreased ROM due to pain. Is able to make a fist. Warm, strong pulses. Denies any elbow or neck pain.   Skin: Negative.   Neurological:  Positive for numbness.    Physical  Exam Triage Vital Signs ED Triage Vitals  Enc Vitals Group     BP 06/03/21 0914 109/77     Pulse Rate 06/03/21 0914 78     Resp 06/03/21 0914 18     Temp 06/03/21 0914 98.3 F (36.8 C)     Temp Source 06/03/21 0914 Oral     SpO2 06/03/21 0914 98 %     Weight --      Height --      Head Circumference --      Peak Flow --      Pain Score 06/03/21 0911 5     Pain Loc --      Pain Edu? --      Excl. in [place]? --    No data found.  Updated Vital Signs BP 109/77 (BP Location: Left Arm)   Pulse 78   Temp 98.3 F (36.8 C) (Oral)   Resp 18   LMP 06/01/2021   SpO2 98%   Breastfeeding Unknown   Visual Acuity Right Eye Distance:   Left Eye Distance:   Bilateral Distance:    Right Eye Near:   Left Eye Near:    Bilateral Near:     Physical Exam Constitutional:      Appearance: Normal appearance.  Cardiovascular:     Rate and Rhythm: Normal rate.  Pulmonary:     Effort: Pulmonary effort is normal.  Abdominal:     General: Abdomen is flat.  Musculoskeletal:        General: Tenderness present. No signs of injury.     Cervical back: Normal range of motion.     Comments: Able to make fist, decreased rom due to pain, warm, strong pulses, no elbow pain, full rom to neck without pain.   Skin:    General: Skin is warm.     Capillary Refill: Capillary refill takes less than 2 seconds.     Findings: No erythema.  Neurological:     General: No focal deficit present.     Mental Status: She is alert.     UC Treatments / Results  Labs (all labs ordered are listed, but only abnormal results are displayed) Labs Reviewed - No data to display  EKG   Radiology No results found.  Procedures Procedures (including critical care time)  Medications Ordered in UC Medications -  No data to display  Initial Impression / Assessment and Plan / UC Course  I have reviewed the triage vital signs and the nursing notes.  Pertinent labs & imaging results that were available during my  care of the patient were reviewed by me and considered in my medical decision making (see chart for details).     Unable to r/o carpal tunnel will need to have a nerve cond study for this  Most likey a tendonitis of the area  Rest  Wear wrist splint  Take NSAids  Will need to f/u with hand specialist  Final Clinical Impressions(s) / UC Diagnoses   Final diagnoses:  Right wrist pain  Tendonitis     Discharge Instructions      Unable to r/o carpal tunnel will need to have a nerve cond study for this  Most likey a tendonitis of the area  Rest  Wear wrist splint  Take NSAids  Will need to f/u with hand specialist       ED Prescriptions     Medication Sig Dispense Auth. Provider   predniSONE (STERAPRED UNI-PAK 21 TAB) 10 MG (21) TBPK tablet Take by mouth daily. Take 6 tabs by mouth daily  for 2 days, then 5 tabs for 2 days, then 4 tabs for 2 days, then 3 tabs for 2 days, 2 tabs for 2 days, then 1 tab by mouth daily for 2 days 42 tablet Marney Setting, NP      PDMP not reviewed this encounter.   Marney Setting, NP 06/03/21 [phone]

## 2021-06-03 NOTE — Discharge Instructions (Addendum)
Unable to r/o carpal tunnel will need to have a nerve cond study for this  Most likey a tendonitis of the area  Rest  Wear wrist splint  Take NSAids  Will need to f/u with hand specialist

## 2021-06-16 ENCOUNTER — Other Ambulatory Visit (HOSPITAL_COMMUNITY): Payer: Self-pay

## 2021-09-14 ENCOUNTER — Ambulatory Visit (INDEPENDENT_AMBULATORY_CARE_PROVIDER_SITE_OTHER): Payer: Medicaid Other

## 2021-09-14 ENCOUNTER — Other Ambulatory Visit: Payer: Self-pay

## 2021-09-14 ENCOUNTER — Encounter: Payer: Self-pay | Admitting: Obstetrics

## 2021-09-14 VITALS — BP 114/71 | HR 80 | Ht 70.0 in | Wt 184.0 lb

## 2021-09-14 DIAGNOSIS — N912 Amenorrhea, unspecified: Secondary | ICD-10-CM

## 2021-09-14 DIAGNOSIS — R110 Nausea: Secondary | ICD-10-CM

## 2021-09-14 LAB — POCT URINE PREGNANCY: Preg Test, Ur: POSITIVE — AB

## 2021-09-14 MED ORDER — PROMETHAZINE HCL 25 MG PO TABS: 25.0000 mg | ORAL_TABLET | Freq: Four times a day (QID) | ORAL | 2 refills | Status: DC | PRN

## 2021-09-14 NOTE — Progress Notes (Signed)
Patient was assessed and managed by nursing staff during this encounter. I have reviewed the chart and agree with the documentation and plan. I have also made any necessary editorial changes.  Catalina Antigua, MD 09/14/2021 10:20 AM

## 2021-09-14 NOTE — Progress Notes (Signed)
Ms. Voit presents today for UPT. She complains of nausea without vomiting for 3 weeks.  LMP: 07/23/21    OBJECTIVE: Appears well, in no apparent distress.  OB History     Gravida  5   Para  2   Term  2   Preterm      AB  2   Living  2      SAB  1   IAB  0   Ectopic      Multiple  0   Live Births  2          Home UPT Result: Positive In-Office UPT result: Positive I have reviewed the patient's medical, obstetrical, social, and family histories, and medications.   ASSESSMENT: Positive pregnancy test  PLAN Prenatal care to be completed at: [facility]. Appointment scheduled for NOB intake and confirmation ultrasound.

## 2021-09-30 ENCOUNTER — Inpatient Hospital Stay (HOSPITAL_COMMUNITY)
Admission: AD | Admit: 2021-09-30 | Discharge: 2021-09-30 | Disposition: A | Discharge status: Home / Self Care | Payer: Medicaid Other | Attending: Obstetrics & Gynecology | Admitting: Obstetrics & Gynecology

## 2021-09-30 ENCOUNTER — Encounter (HOSPITAL_COMMUNITY): Payer: Self-pay | Admitting: Obstetrics & Gynecology

## 2021-09-30 ENCOUNTER — Other Ambulatory Visit: Payer: Self-pay

## 2021-09-30 DIAGNOSIS — O23591 Infection of other part of genital tract in pregnancy, first trimester: Secondary | ICD-10-CM | POA: Diagnosis not present

## 2021-09-30 DIAGNOSIS — B3731 Acute candidiasis of vulva and vagina: Secondary | ICD-10-CM | POA: Insufficient documentation

## 2021-09-30 DIAGNOSIS — Z3A09 9 weeks gestation of pregnancy: Secondary | ICD-10-CM | POA: Diagnosis not present

## 2021-09-30 DIAGNOSIS — O98811 Other maternal infectious and parasitic diseases complicating pregnancy, first trimester: Secondary | ICD-10-CM | POA: Diagnosis present

## 2021-09-30 HISTORY — DX: Gestational (pregnancy-induced) hypertension without significant proteinuria, unspecified trimester: O13.9

## 2021-09-30 LAB — WET PREP, GENITAL
Sperm: NONE SEEN
Trich, Wet Prep: NONE SEEN
WBC, Wet Prep HPF POC: >=10 — AB (ref <10)

## 2021-09-30 MED ORDER — TERCONAZOLE 0.4 % VA CREA: 1.0000 | TOPICAL_CREAM | Freq: Every day | VAGINAL | 0 refills | Status: DC

## 2021-09-30 NOTE — MAU Note (Signed)
Having vag d/c for 4 days. Started out brown/red. Out of nowhere pelvic area hurting and having green vag d/c. Vaginal area is very irritated and urine makes it sting when I urinate. No bleeding now.

## 2021-09-30 NOTE — Progress Notes (Signed)
Written and verbal d/c instructions given and understanding voiced. 

## 2021-09-30 NOTE — MAU Provider Note (Signed)
History     CSN: [id number]  Arrival date and time: 09/30/21 2041   Event Date/Time   First Provider Initiated Contact with Patient 09/30/21 2133      Chief Complaint  Patient presents with   Vaginal Discharge   HPI Terry [name] is a 25 y.o. [id number] at [redacted]w[redacted]d who presents with vaginal discharge. Symptoms started 4 days ago. Reports thick green vaginal discharge. Has associated vaginal itching & burning. Symptoms are worse when urine touches her labia. Denies abdominal pain or vaginal bleeding. No intercourse since finding out she was pregnant. Hasn't treated symptoms.   OB History     Gravida  5   Para  2   Term  2   Preterm      AB  2   Living  2      SAB  1   IAB  0   Ectopic      Multiple  0   Live Births  2           Past Medical History:  Diagnosis Date   Chlamydia    Depression    Gonorrhea    Pregnancy induced hypertension     Past Surgical History:  Procedure Laterality Date   NO PAST SURGERIES      Family History  Problem Relation Age of Onset   Cancer Maternal Grandmother        pancreatic   Diabetes Maternal Uncle    Diabetes Mother    Hypertension Mother    Diabetes Father     Social History   Tobacco Use   Smoking status: Never   Smokeless tobacco: Never  Vaping Use   Vaping Use: Never used  Substance Use Topics   Alcohol use: No   Drug use: No    Allergies: No Known Allergies  Medications Prior to Admission  Medication Sig Dispense Refill Last Dose   promethazine (PHENERGAN) 25 MG tablet Take 1 tablet (25 mg total) by mouth every 6 (six) hours as needed for nausea or vomiting. 30 tablet 2 Past Week   acetaminophen (TYLENOL) 500 MG tablet Take 2 tablets (1,000 mg total) by mouth every 6 (six) hours. (Patient not taking: Reported on 09/14/2021) 30 tablet 0    Blood Pressure Monitoring (BLOOD PRESSURE CUFF) MISC 1 Device by Does not apply route once a week. (Patient not taking: Reported on 09/14/2021) 1 each 0     Blood Pressure Monitoring (BLOOD PRESSURE KIT) DEVI 1 kit by Does not apply route once a week. Check Blood Pressure regularly and record readings into the Babyscripts App.  Large Cuff.  DX O90.0 (Patient not taking: Reported on 09/14/2021) 1 each 0    Elastic Bandages & Supports MISC 1 Device by Does not apply route daily as needed. (Patient not taking: Reported on 09/14/2021) 1 Device 0    ferrous sulfate 325 (65 FE) MG tablet Take 1 tablet (325 mg total) by mouth 2 (two) times daily with a meal. (Patient not taking: Reported on 09/14/2021) 60 tablet 5    predniSONE (STERAPRED UNI-PAK 21 TAB) 10 MG (21) TBPK tablet Take by mouth daily. Take 6 tabs by mouth daily  for 2 days, then 5 tabs for 2 days, then 4 tabs for 2 days, then 3 tabs for 2 days, 2 tabs for 2 days, then 1 tab by mouth daily for 2 days (Patient not taking: Reported on 09/14/2021) 42 tablet 0    Prenatal Vit-Fe Fumarate-FA (PRENATAL COMPLETE) 14-0.4 MG TABS Take  1 tablet by mouth daily. (Patient not taking: Reported on 09/14/2021) 60 tablet 0    Prenatal Vit-Fe Fumarate-FA (PRENATAL VITAMINS PO) Take by mouth.  (Patient not taking: Reported on 09/14/2021)      sertraline (ZOLOFT) 50 MG tablet Take 1 tablet (50 mg total) by mouth daily. (Patient not taking: Reported on 04/10/2021) 30 tablet 1     Review of Systems  Constitutional: Negative.   Gastrointestinal: Negative.   Genitourinary:  Positive for vaginal discharge and vaginal pain. Negative for dysuria and vaginal bleeding.  Physical Exam   Blood pressure 109/75, pulse 84, temperature 98.5 F (36.9 C), resp. rate 17, height $RemoveBe'5\' 10"'sKKNsuQVd$  (1.778 m), weight 84.4 kg, last menstrual period 07/23/2021, SpO2 100 %, unknown if currently breastfeeding.  Physical Exam Vitals and nursing note reviewed. Exam conducted with a chaperone present.  Constitutional:      Appearance: Normal appearance. She is not ill-appearing.  HENT:     Head: Normocephalic and atraumatic.  Eyes:     General: No scleral  icterus. Pulmonary:     Effort: Pulmonary effort is normal. No respiratory distress.  Abdominal:     Palpations: Abdomen is soft.     Tenderness: There is no abdominal tenderness.  Genitourinary:    General: Normal vulva.     Vagina: Vaginal discharge (moderate amount of green tinged clumpy dishcarge adherent to vaginal walls.) and erythema present. No bleeding.     Cervix: No discharge, friability, lesion or cervical bleeding.  Neurological:     General: No focal deficit present.     Mental Status: She is alert.  Psychiatric:        Mood and Affect: Mood normal.        Behavior: Behavior normal.   Ultrasound Pt informed that the ultrasound is considered a limited OB ultrasound and is not intended to be a complete ultrasound exam.  Patient also informed that the ultrasound is not being completed with the intent of assessing for fetal or placental anomalies or any pelvic abnormalities.  Explained that the purpose of today's ultrasound is to assess for  viability.  Patient acknowledges the purpose of the exam and the limitations of the study.    Impression: live IUP. FHR 169 bpm  MAU Course  Procedures Results for orders placed or performed during the hospital encounter of 09/30/21 (from the past 24 hour(s))  Wet prep, genital     Status: Abnormal   Collection Time: 09/30/21  9:42 PM   Specimen: PATH Cytology Cervicovaginal Ancillary Only  Result Value Ref Range   Yeast Wet Prep HPF POC PRESENT (A) NONE SEEN   Trich, Wet Prep NONE SEEN NONE SEEN   Clue Cells Wet Prep HPF POC PRESENT (A) NONE SEEN   WBC, Wet Prep HPF POC >=10 (A) <10   Sperm NONE SEEN     MDM Patient presents with vaginal discharge & irritation. Exam consistent with yeast. Wet prep is positive for yeast. Will tx with rx terazol.  Denies other symptoms.    Assessment and Plan   1. Vaginal yeast infection   2. [redacted] weeks gestation of pregnancy    -rx terazol -GC/CT pending Reviewed reasons to return to  [place] 09/30/2021, 9:33 PM

## 2021-10-02 LAB — GC/CHLAMYDIA PROBE AMP (~~LOC~~) NOT AT ARMC
Chlamydia: NEGATIVE
Comment: NEGATIVE
Comment: NORMAL
Neisseria Gonorrhea: NEGATIVE

## 2021-10-15 ENCOUNTER — Encounter: Payer: Self-pay | Admitting: Obstetrics and Gynecology

## 2021-10-15 ENCOUNTER — Ambulatory Visit (INDEPENDENT_AMBULATORY_CARE_PROVIDER_SITE_OTHER): Payer: Medicaid Other

## 2021-10-15 ENCOUNTER — Ambulatory Visit (INDEPENDENT_AMBULATORY_CARE_PROVIDER_SITE_OTHER): Payer: BC Managed Care – PPO | Admitting: Obstetrics and Gynecology

## 2021-10-15 ENCOUNTER — Other Ambulatory Visit: Payer: Self-pay

## 2021-10-15 VITALS — BP 125/78 | HR 106 | Wt 183.0 lb

## 2021-10-15 DIAGNOSIS — O99340 Other mental disorders complicating pregnancy, unspecified trimester: Secondary | ICD-10-CM

## 2021-10-15 DIAGNOSIS — Z3481 Encounter for supervision of other normal pregnancy, first trimester: Secondary | ICD-10-CM | POA: Diagnosis not present

## 2021-10-15 DIAGNOSIS — Z3A12 12 weeks gestation of pregnancy: Secondary | ICD-10-CM | POA: Diagnosis not present

## 2021-10-15 DIAGNOSIS — O3680X0 Pregnancy with inconclusive fetal viability, not applicable or unspecified: Secondary | ICD-10-CM

## 2021-10-15 DIAGNOSIS — F32A Depression, unspecified: Secondary | ICD-10-CM

## 2021-10-15 DIAGNOSIS — Z3480 Encounter for supervision of other normal pregnancy, unspecified trimester: Secondary | ICD-10-CM

## 2021-10-15 DIAGNOSIS — Z23 Encounter for immunization: Secondary | ICD-10-CM | POA: Diagnosis not present

## 2021-10-15 DIAGNOSIS — O09299 Supervision of pregnancy with other poor reproductive or obstetric history, unspecified trimester: Secondary | ICD-10-CM | POA: Diagnosis not present

## 2021-10-15 DIAGNOSIS — Z3009 Encounter for other general counseling and advice on contraception: Secondary | ICD-10-CM

## 2021-10-15 MED ORDER — BLOOD PRESSURE KIT DEVI: 1.0000 | 0 refills | Status: DC

## 2021-10-15 MED ORDER — SERTRALINE HCL 50 MG PO TABS: 50.0000 mg | ORAL_TABLET | Freq: Every day | ORAL | 11 refills | Status: AC

## 2021-10-15 MED ORDER — VITAFOL GUMMIES 3.33-0.333-34.8 MG PO CHEW: 1.0000 | CHEWABLE_TABLET | Freq: Every day | ORAL | 4 refills | Status: DC

## 2021-10-15 NOTE — Progress Notes (Signed)
NOB, c/o of [place] the current medication (Phenergan) makes her drowsy.  PHQ-9=13

## 2021-10-15 NOTE — Patient Instructions (Signed)
First Trimester of Pregnancy °The first trimester of pregnancy starts on the first day of your last menstrual period until the end of week 12. This is months 1 through 3 of pregnancy. A week after a sperm fertilizes an egg, the egg will implant into the wall of the uterus and begin to develop into a baby. By the end of 12 weeks, all the baby's organs will be formed and the baby will be 2-3 inches in size. °Body changes during your first trimester °Your body goes through many changes during pregnancy. The changes vary and generally return to normal after your baby is born. °Physical changes °You may gain or lose weight. °Your breasts may begin to grow larger and become tender. The tissue that surrounds your nipples (areola) may become darker. °Dark spots or blotches (chloasma or mask of pregnancy) may develop on your face. °You may have changes in your hair. These can include thickening or thinning of your hair or changes in texture. °Health changes °You may feel nauseous, and you may vomit. °You may have heartburn. °You may develop headaches. °You may develop constipation. °Your gums may bleed and may be sensitive to brushing and flossing. °Other changes °You may tire easily. °You may urinate more often. °Your menstrual periods will stop. °You may have a loss of appetite. °You may develop cravings for certain kinds of food. °You may have changes in your emotions from day to day. °You may have more vivid and strange dreams. °Follow these instructions at home: °Medicines °Follow your health care provider's instructions regarding medicine use. Specific medicines may be either safe or unsafe to take during pregnancy. Do not take any medicines unless told to by your health care provider. °Take a prenatal vitamin that contains at least 600 micrograms (mcg) of folic acid. °Eating and drinking °Eat a healthy diet that includes fresh fruits and vegetables, whole grains, good sources of protein such as meat, eggs, or tofu,  and low-fat dairy products. °Avoid raw meat and unpasteurized juice, milk, and cheese. These carry germs that can harm you and your baby. °If you feel nauseous or you vomit: °Eat 4 or 5 small meals a day instead of 3 large meals. °Try eating a few soda crackers. °Drink liquids between meals instead of during meals. °You may need to take these actions to prevent or treat constipation: °Drink enough fluid to keep your urine pale yellow. °Eat foods that are high in fiber, such as beans, whole grains, and fresh fruits and vegetables. °Limit foods that are high in fat and processed sugars, such as fried or sweet foods. °Activity °Exercise only as directed by your health care provider. Most people can continue their usual exercise routine during pregnancy. Try to exercise for 30 minutes at least 5 days a week. °Stop exercising if you develop pain or cramping in the lower abdomen or lower back. °Avoid exercising if it is very hot or humid or if you are at high altitude. °Avoid heavy lifting. °If you choose to, you may have sex unless your health care provider tells you not to. °Relieving pain and discomfort °Wear a good support bra to relieve breast tenderness. °Rest with your legs elevated if you have leg cramps or low back pain. °If you develop bulging veins (varicose veins) in your legs: °Wear support hose as told by your health care provider. °Elevate your feet for 15 minutes, 3-4 times a day. °Limit salt in your diet. °Safety °Wear your seat belt at all times when driving   or riding in a car. °Talk with your health care provider if someone is verbally or physically abusive to you. °Talk with your health care provider if you are feeling sad or have thoughts of hurting yourself. °Lifestyle °Do not use hot tubs, steam rooms, or saunas. °Do not douche. Do not use tampons or scented sanitary pads. °Do not use herbal remedies, alcohol, illegal drugs, or medicines that are not approved by your health care provider. Chemicals  in these products can harm your baby. °Do not use any products that contain nicotine or tobacco, such as cigarettes, e-cigarettes, and chewing tobacco. If you need help quitting, ask your health care provider. °Avoid cat litter boxes and soil used by cats. These carry germs that can cause birth defects in the baby and possibly loss of the unborn baby (fetus) by miscarriage or stillbirth. °General instructions °During routine prenatal visits in the first trimester, your health care provider will do a physical exam, perform necessary tests, and ask you how things are going. Keep all follow-up visits. This is important. °Ask for help if you have counseling or nutritional needs during pregnancy. Your health care provider can offer advice or refer you to specialists for help with various needs. °Schedule a dentist appointment. At home, brush your teeth with a soft toothbrush. Floss gently. °Write down your questions. Take them to your prenatal visits. °Where to find more information °American Pregnancy Association: americanpregnancy.org °American College of Obstetricians and Gynecologists: acog.org/en/Womens%20Health/Pregnancy °Office on Women's Health: womenshealth.gov/pregnancy °Contact a health care provider if you have: °Dizziness. °A fever. °Mild pelvic cramps, pelvic pressure, or nagging pain in the abdominal area. °Nausea, vomiting, or diarrhea that lasts for 24 hours or longer. °A bad-smelling vaginal discharge. °Pain when you urinate. °Known exposure to a contagious illness, such as chickenpox, measles, Zika virus, HIV, or hepatitis. °Get help right away if you have: °Spotting or bleeding from your vagina. °Severe abdominal cramping or pain. °Shortness of breath or chest pain. °Any kind of trauma, such as from a fall or a car crash. °New or increased pain, swelling, or redness in an arm or leg. °Summary °The first trimester of pregnancy starts on the first day of your last menstrual period until the end of week  12 (months 1 through 3). °Eating 4 or 5 small meals a day rather than 3 large meals may help to relieve nausea and vomiting. °Do not use any products that contain nicotine or tobacco, such as cigarettes, e-cigarettes, and chewing tobacco. If you need help quitting, ask your health care provider. °Keep all follow-up visits. This is important. °This information is not intended to replace advice given to you by your health care provider. Make sure you discuss any questions you have with your health care provider. °Document Revised: 04/02/2020 Document Reviewed: 02/07/2020 °Elsevier Patient Education © 2022 [employer] ° °

## 2021-10-15 NOTE — Progress Notes (Signed)
Subjective:  Terry [name] is a 25 y.o. [id number] at [redacted]w[redacted]d being seen today for her first OB appt. EDD by LMP and confirmed by first trimester U/S. H/O depression, desires medication and counseling. TSVD x 2 without probelms.  She is currently monitored for the following issues for this high-risk pregnancy and has Supervision of other normal pregnancy, antepartum; Depression affecting pregnancy; History of prior pregnancy with short cervix, currently pregnant; and Unwanted fertility on their problem list.  Patient reports general first trimester pregnancy Sx.   .  .   . Denies leaking of fluid.   The following portions of the patient's history were reviewed and updated as appropriate: allergies, current medications, past family history, past medical history, past social history, past surgical history and problem list. Problem list updated.  Objective:   Vitals:   10/15/21 1422  BP: 125/78  Pulse: (!) 106  Weight: 183 lb (83 kg)    Fetal Status: Fetal Heart Rate (bpm): 149         General:  Alert, oriented and cooperative. Patient is in no acute distress.  Skin: Skin is warm and dry. No rash noted.   Cardiovascular: Normal heart rate noted  Respiratory: Normal respiratory effort, no problems with respiration noted  Abdomen: Soft, gravid, appropriate for gestational age.       Pelvic:  Cervical exam deferred        Extremities: Normal range of motion.     Mental Status: Normal mood and affect. Normal behavior. Normal judgment and thought content.   Urinalysis:      Assessment and Plan:  Pregnancy: [id number] at [redacted]w[redacted]d  1. Supervision of other normal pregnancy, antepartum Prenatal labs and care reviewed with pt Genetic testing discussed - US OB Limited; Future - Genetic Screening - Urine Culture - Flu Vaccine QUAD 36+ mos IM (Fluarix, Quad PF) - CBC/D/Plt+RPR+Rh+ABO+RubIgG... - Prenatal Vit-Fe Phos-FA-Omega (VITAFOL GUMMIES) 3.33-0.333-34.8 MG CHEW; Chew 1 tablet by mouth daily.   Dispense: 90 tablet; Refill: 4 - Ambulatory referral to [facility] - Enroll Patient in PreNatal Babyscripts - Babyscripts Schedule Optimization - Hemoglobin A1c  2. Encounter to determine fetal viability of pregnancy, single or unspecified fetus  - US OB Limited; Future  3. History of prior pregnancy with short cervix, currently pregnant CL with anatomy scan Has had 2 TSVD's  4. Depression affecting pregnancy  - sertraline (ZOLOFT) 50 MG tablet; Take 1 tablet (50 mg total) by mouth daily.  Dispense: 30 tablet; Refill: 11  5. Unwanted fertility BTL papers @ 28 visit  Preterm labor symptoms and general obstetric precautions including but not limited to vaginal bleeding, contractions, leaking of fluid and fetal movement were reviewed in detail with the patient. Please refer to After Visit Summary for other counseling recommendations.  Return in about 4 weeks (around 11/12/2021) for OB visit.   Hermina Staggers, MD

## 2021-10-16 LAB — CBC/D/PLT+RPR+RH+ABO+RUBIGG...
Antibody Screen: NEGATIVE
Basophils Absolute: 0 10*3/uL (ref 0.0–0.2)
Basos: 0 %
EOS (ABSOLUTE): 0.1 10*3/uL (ref 0.0–0.4)
Eos: 1 %
HCV Ab: 0.1 s/co ratio (ref 0.0–0.9)
HIV Screen 4th Generation wRfx: NONREACTIVE
Hematocrit: 40.7 % (ref 34.0–46.6)
Hemoglobin: 13.7 g/dL (ref 11.1–15.9)
Hepatitis B Surface Ag: NEGATIVE
Immature Grans (Abs): 0 10*3/uL (ref 0.0–0.1)
Immature Granulocytes: 0 %
Lymphocytes Absolute: 1.6 10*3/uL (ref 0.7–3.1)
Lymphs: 16 %
MCH: 32.5 pg (ref 26.6–33.0)
MCHC: 33.7 g/dL (ref 31.5–35.7)
MCV: 96 fL (ref 79–97)
Monocytes Absolute: 0.6 10*3/uL (ref 0.1–0.9)
Monocytes: 6 %
Neutrophils Absolute: 7.8 10*3/uL — ABNORMAL HIGH (ref 1.4–7.0)
Neutrophils: 77 %
Platelets: 300 10*3/uL (ref 150–450)
RBC: 4.22 x10E6/uL (ref 3.77–5.28)
RDW: 12.2 % (ref 11.7–15.4)
RPR Ser Ql: NONREACTIVE
Rh Factor: POSITIVE
Rubella Antibodies, IGG: 13 index (ref >0.99)
WBC: 10.2 10*3/uL (ref 3.4–10.8)

## 2021-10-16 LAB — HCV INTERPRETATION

## 2021-10-16 LAB — HEMOGLOBIN A1C
Est. average glucose Bld gHb Est-mCnc: 94 mg/dL
Hgb A1c MFr Bld: 4.9 % (ref 4.8–5.6)

## 2021-10-18 LAB — URINE CULTURE

## 2021-10-19 ENCOUNTER — Other Ambulatory Visit: Payer: Self-pay

## 2021-10-19 ENCOUNTER — Inpatient Hospital Stay (HOSPITAL_COMMUNITY)
Admission: AD | Admit: 2021-10-19 | Discharge: 2021-10-19 | Disposition: A | Discharge status: Home / Self Care | Payer: BC Managed Care – PPO | Attending: Obstetrics & Gynecology | Admitting: Obstetrics & Gynecology

## 2021-10-19 ENCOUNTER — Encounter (HOSPITAL_COMMUNITY): Payer: Self-pay | Admitting: Obstetrics & Gynecology

## 2021-10-19 DIAGNOSIS — O26859 Spotting complicating pregnancy, unspecified trimester: Secondary | ICD-10-CM

## 2021-10-19 DIAGNOSIS — Z3A12 12 weeks gestation of pregnancy: Secondary | ICD-10-CM | POA: Diagnosis not present

## 2021-10-19 DIAGNOSIS — Z6790 Unspecified blood type, Rh positive: Secondary | ICD-10-CM | POA: Diagnosis not present

## 2021-10-19 DIAGNOSIS — O26851 Spotting complicating pregnancy, first trimester: Secondary | ICD-10-CM | POA: Insufficient documentation

## 2021-10-19 LAB — URINALYSIS, ROUTINE W REFLEX MICROSCOPIC
Bilirubin Urine: NEGATIVE
Glucose, UA: NEGATIVE mg/dL
Ketones, ur: NEGATIVE mg/dL
Leukocytes,Ua: NEGATIVE
Nitrite: NEGATIVE
Protein, ur: NEGATIVE mg/dL
Specific Gravity, Urine: >1.03 — ABNORMAL HIGH (ref 1.005–1.030)
pH: 6 (ref 5.0–8.0)

## 2021-10-19 LAB — URINALYSIS, MICROSCOPIC (REFLEX)

## 2021-10-19 NOTE — MAU Note (Signed)
Terry [name] is a 25 y.o. at [redacted]w[redacted]d here in MAU reporting: spotting for the past 3 days. States she is having to wear a panty liner. Also having lower abdominal pain.  Onset of complaint: ongoing  Pain score: 4/10  Vitals:   10/19/21 1654  BP: 132/65  Pulse: 95  Resp: 16  Temp: 98.6 F (37 C)  SpO2: 99%     FHT:150  Lab orders placed from triage: UA

## 2021-10-19 NOTE — Discharge Instructions (Addendum)
-  Bedside ultrasound today confirmed intrauterine pregnancy with cardiac activity, FHR 156.  -Please follow up with your OB provider as scheduled in January.  -Please return here if your bleeding, abdominal pain becomes worse or if you develop fever.

## 2021-10-19 NOTE — MAU Provider Note (Addendum)
History     CSN: [id number]  Arrival date and time: 10/19/21 1637  Chief Complaint  Patient presents with   Abdominal Pain   Vaginal Bleeding   Patient is a G5P2 here for vaginal bleeding. States she started have vaginal spotting 3 days ago. Brown blood, and has to wear a liner in her underwear. Not heavy like a period, no passage of clots or bright red blood. No abdominal pain, some intermittent cramping.  Did state she had a yeast infection that was treated 1 week ago. Symptoms have improved. No recent sexual intercourse. Is worried because she has not had any bleeding with previous pregnancies.   Vaginal Bleeding The patient's primary symptoms include vaginal bleeding. The patient's pertinent negatives include no genital itching, genital odor, pelvic pain or vaginal discharge. This is a new problem. Episode onset: 3 days ago. Pertinent negatives include no abdominal pain, back pain, dysuria or fever. The vaginal discharge was normal. The vaginal bleeding is spotting.   OB History     Gravida  5   Para  2   Term  2   Preterm      AB  2   Living  2      SAB  1   IAB  0   Ectopic      Multiple  0   Live Births  2           Past Medical History:  Diagnosis Date   Chlamydia    Depression    Gonorrhea    Pregnancy induced hypertension     Past Surgical History:  Procedure Laterality Date   NO PAST SURGERIES      Family History  Problem Relation Age of Onset   Cancer Maternal Grandmother        pancreatic   Diabetes Maternal Uncle    Diabetes Mother    Hypertension Mother    Diabetes Father     Social History   Tobacco Use   Smoking status: Never   Smokeless tobacco: Never  Vaping Use   Vaping Use: Never used  Substance Use Topics   Alcohol use: No   Drug use: No    Allergies: No Known Allergies  Medications Prior to Admission  Medication Sig Dispense Refill Last Dose   Blood Pressure Monitoring (BLOOD PRESSURE KIT) DEVI 1 kit by  Does not apply route once a week. Check Blood Pressure regularly and record readings into the [employer] App.  Large Cuff.  DX O90.0 1 each 0    Prenatal Vit-Fe Phos-FA-Omega (VITAFOL GUMMIES) 3.33-0.333-34.8 MG CHEW Chew 1 tablet by mouth daily. 90 tablet 4    promethazine (PHENERGAN) 25 MG tablet Take 1 tablet (25 mg total) by mouth every 6 (six) hours as needed for nausea or vomiting. 30 tablet 2    sertraline (ZOLOFT) 50 MG tablet Take 1 tablet (50 mg total) by mouth daily. 30 tablet 11     Review of Systems  Constitutional:  Negative for fever.  Respiratory:  Negative for shortness of breath.   Cardiovascular:  Negative for chest pain.  Gastrointestinal:  Negative for abdominal pain.  Genitourinary:  Positive for vaginal bleeding. Negative for dysuria, pelvic pain and vaginal discharge.  Musculoskeletal:  Negative for back pain.  Neurological:  Negative for dizziness.  Physical Exam   Blood pressure 132/65, pulse 95, temperature 98.6 F (37 C), temperature source Oral, resp. rate 16, height _0  (1.778 m), weight 84 kg, last menstrual period 07/23/2021, SpO2 99 %,  unknown if currently breastfeeding.  Physical Exam Constitutional:      General: She is not in acute distress. Cardiovascular:     Rate and Rhythm: Normal rate.  Pulmonary:     Effort: Pulmonary effort is normal.  Abdominal:     Palpations: Abdomen is soft.     Tenderness: There is no abdominal tenderness.  Skin:    General: Skin is warm.     Findings: No rash.  Neurological:     General: No focal deficit present.     Mental Status: She is alert.  Psychiatric:        Mood and Affect: Mood normal.   MAU Course  Procedures  MDM Patient with vaginal spotting. Had normal IUP on 12/8 with no evidence of subchorionic hemorrhage. +FHT today and bedside ultrasound confirming IUP with cardiac activity, FHT 156.   Assessment and Plan  Vaginal spotting in first trimester -discharge home with OB follow-up  outpatient as discussed -bleeding could be due to resolving yeast infection, no subchorionic hemorrhage seen on ultrasound.  -UA results pending at time of discharge, denies dysuria/frequency.  -return here If symptoms become worse   Terry [name] M.D., PGY-2 10/19/2021, 6:52 PM   CNM attestation:  HPI: Terry [name] is a 25 y.o. [phone] at 71w4dpresenting with spotting  ROS, labs, PMH reviewed + VB + cramping  PE: Patient Vitals for the past 24 hrs:  BP Temp Temp src Pulse Resp SpO2 Height Weight  10/19/21 1654 132/65 98.6 F (37 C) Oral 95 16 99 % _0  (1.778 m) 84 kg   Gen: A&O x3, NAD Resp: normal effort and rate, no distress Heart: regular rate Abd: Soft, NT FHT 156  Limited bedside [place] viable, active fetus, +cardiac activity, subj. nml AFV, no [place] Orders Placed This Encounter  Procedures   Urinalysis, Routine w reflex microscopic Urine, Clean Catch   Discharge patient Discharge disposition: 01-Home or Self Care; Discharge patient date: 10/19/2021   No orders of the defined types were placed in this encounter.   MDM No signs of impending SAB. Pt reassured. Stable for discharge home.   Assessment: 1. [redacted] weeks gestation of pregnancy   2. Spotting in pregnancy   3. Blood type, Rh positive     Plan: - Discharge home  - SAB precautions - [facility] sooner as needed if symptoms worsen - Return to maternity admissions if symptoms worsen  I confirm that I have verified the information documented in the resident's note. I personally was present during the history, physical exam, and medical decision-making activities of this service and have verified that the service and findings are accurately documented in the student's note.  BJulianne Handler CNM 10/19/2021 7:43 PM

## 2021-10-20 ENCOUNTER — Encounter: Payer: Medicaid Other | Admitting: Licensed Clinical Social Worker

## 2021-10-23 ENCOUNTER — Encounter: Payer: Self-pay | Admitting: Obstetrics and Gynecology

## 2021-10-27 ENCOUNTER — Encounter: Payer: Self-pay | Admitting: Obstetrics and Gynecology

## 2021-11-08 NOTE — L&D Delivery Note (Addendum)
Delivery Note Progressed to complete dilation and pushed well in side-lying position  At 3:31 AM a viable and healthy female was delivered via Vaginal, Spontaneous (Presentation: Left Occiput Anterior).  APGAR: 8, 9; weight  .   Placenta status: Spontaneous, Intact.  Cord: 3 vessels with the following complications: Nuchal cord x 1, loose, reduced before delivery of shoulders    Anesthesia: Epidural Episiotomy: None Lacerations: None Suture Repair:  none Est. Blood Loss (mL): 50  Post placental IUD placed  After placenta delivered and uterus massaged, Mirena IUD inserted into uterus with applicator Position confirmed with bimanual exam String trimmed just inside introitus.   Mom to postpartum.  Baby to Couplet care / Skin to Skin.  Hansel Feinstein 04/18/2022, 3:51 AM

## 2021-11-12 ENCOUNTER — Other Ambulatory Visit: Payer: Self-pay

## 2021-11-12 ENCOUNTER — Ambulatory Visit (INDEPENDENT_AMBULATORY_CARE_PROVIDER_SITE_OTHER): Payer: Medicaid Other

## 2021-11-12 ENCOUNTER — Ambulatory Visit (INDEPENDENT_AMBULATORY_CARE_PROVIDER_SITE_OTHER): Payer: Medicaid Other | Admitting: Licensed Clinical Social Worker

## 2021-11-12 VITALS — BP 119/73 | HR 85 | Wt 183.0 lb

## 2021-11-12 DIAGNOSIS — Z3A16 16 weeks gestation of pregnancy: Secondary | ICD-10-CM

## 2021-11-12 DIAGNOSIS — Z3480 Encounter for supervision of other normal pregnancy, unspecified trimester: Secondary | ICD-10-CM

## 2021-11-12 DIAGNOSIS — O99340 Other mental disorders complicating pregnancy, unspecified trimester: Secondary | ICD-10-CM | POA: Diagnosis not present

## 2021-11-12 DIAGNOSIS — F32A Depression, unspecified: Secondary | ICD-10-CM

## 2021-11-12 NOTE — Progress Notes (Signed)
Reports decreased appetite still, states has lost 20 lbs, drinking ensure but vomiting. Taking meds but still not able to eat much.

## 2021-11-12 NOTE — Progress Notes (Signed)
° °  LOW-RISK PREGNANCY OFFICE VISIT  Patient name: Terry [name] MRN [id number]  Date of birth: July 12, 1996 Chief Complaint:   Routine Prenatal Visit  Subjective:   Terry [name] is a 26 y.o. [id number] female at [redacted]w[redacted]d with an Estimated Date of Delivery: 04/29/22 being seen today for ongoing management of a low-risk pregnancy aeb has Supervision of other normal pregnancy, antepartum; Depression affecting pregnancy; History of prior pregnancy with short cervix, currently pregnant; and Unwanted fertility on their problem list.  Patient presents today with vomiting.  She reports decreased appetite and has no concerns with this or the subsequent weight loss.  However, patient is concerned with baby's health. Patient endorses fetal movement and expresses surprise by early onset. Patient denies abdominal cramping or contractions.  Patient denies vaginal concerns including abnormal discharge, leaking of fluid, and bleeding.  Contractions: Not present. Vag. Bleeding: None.  Movement: Present.  Reviewed past medical,surgical, social, obstetrical and family history as well as problem list, medications and allergies.  Objective   Vitals:   11/12/21 1034  BP: 119/73  Pulse: 85  Weight: 183 lb (83 kg)  Body mass index is 26.26 kg/m.  Total Weight Gain:-17 lb (-7.711 kg)         Physical Examination:   General appearance: Well appearing, and in no distress  Mental status: Alert, oriented to person, place, and time  Skin: Warm & dry  Cardiovascular: Normal heart rate noted  Respiratory: Normal respiratory effort, no distress  Abdomen: Soft, gravid, nontender,   Pelvic: Cervical exam deferred           Extremities:    Fetal Status: Fetal Heart Rate (bpm): 160  Movement: Present   No results found for this or any previous visit (from the past 24 hour(s)).  Assessment & Plan:  Low-risk pregnancy of a 26 y.o., [id number] at [redacted]w[redacted]d with an Estimated Date of Delivery: 04/29/22   1. Supervision of other  normal pregnancy, antepartum -Anticipatory guidance for upcoming appts. -Patient to schedule next appt in 4 weeks for an in-person visit. -Discussed AFP and why it is performed. -Patient would like to proceed. -[place] ordered.   2. [redacted] weeks gestation of pregnancy -Reviewed concerns regarding fetal health status. -Reassured that fetus will take necessary nutrients from patient. -Reassured that weight loss is of concern, but still early in pregnancy and will likely gain in later pregnancy. -Reviewed possible contributors to decreased appetite including busy home life, caretaker status, stress, and depression. -Encouraged to be more conscientious of eating and encouraged small frequent high protein meals throughout the day. -Instructed to notify provider if other factors, like food aversions or nausea, are contributing to decreased appetite.   3. Depression affecting pregnancy -Denies current feelings of depression. -Taking Zoloft as prescribed. -Will speak with Sue Lush today.  -Encouraged to inform us if more therapy/interactions needed to increase coping skills.      Meds: No orders of the defined types were placed in this encounter.  Labs/procedures today:  Lab Orders  No laboratory test(s) ordered today     Reviewed: Preterm labor symptoms and general obstetric precautions including but not limited to vaginal bleeding, contractions, leaking of fluid and fetal movement were reviewed in detail with the patient.  All questions were answered.  Follow-up: No follow-ups on file.  No orders of the defined types were placed in this encounter.  Cherre Robins MSN, CNM 11/12/2021

## 2021-11-14 LAB — AFP, SERUM, OPEN SPINA BIFIDA
AFP MoM: 0.83
AFP Value: 28.3 ng/mL
Gest. Age on Collection Date: 16 weeks
Maternal Age At EDD: 27.7 yr
OSBR Risk 1 IN: 10000
Test Results:: NEGATIVE
Weight: 183 [lb_av]

## 2021-11-16 NOTE — BH Specialist Note (Signed)
[place] Initial In-Person Visit  MRN: [id number] Name: Terry [name]  Number of [place] Clinician visits:: 1/6 Session Start time: 1130am  Session End time: 1155am Total time: 25 minutes in person at [facility]   Types of Service: Individual psychotherapy  Interpretor:No. Interpretor Name and Language: none   Warm Hand Off Completed.        Subjective: Terry [name] is a 26 y.o. female accompanied by n/a Patient was referred by Milinda Cave CNM for history of depression. Patient reports the following symptoms/concerns: depressed mood,  Duration of problem: over one year ; Severity of problem: mild  Objective: Mood: good  and Affect: Appropriate Risk of harm to self or others: No plan to harm self or others  Life Context: Family and Social: Lives with father of baby and children School/Work: n/a Self-Care: n/a Life Changes: unplanned pregnancy  Patient and/or Family's Strengths/Protective Factors: Concrete supports in place (healthy food, safe environments, etc.)  Goals Addressed: Patient will: Reduce symptoms of: depression Increase knowledge and/or ability of: coping skills  Demonstrate ability to: Increase healthy adjustment to current life circumstances  Progress towards Goals: Ongoing  Interventions: Interventions utilized: Supportive Counseling  Standardized Assessments completed: PHQ 9    Assessment: Patient currently experiencing .   Patient may benefit from integrated behavioral health .  Plan: Follow up with behavioral health clinician on : 12/09/2021 Behavioral recommendations: prioritze rest, intentionally schedule self care to reduce stress, practice mindfulness tech and prenatal yoga and attend all scheduled medical appts.  Referral(s): [place] (In Clinic) "From scale of 1-10, how likely are you to follow plan?":   Lynnea Ferrier, LCSW

## 2021-12-08 ENCOUNTER — Other Ambulatory Visit: Payer: Self-pay

## 2021-12-08 ENCOUNTER — Ambulatory Visit: Payer: Medicaid Other

## 2021-12-08 DIAGNOSIS — Z3480 Encounter for supervision of other normal pregnancy, unspecified trimester: Secondary | ICD-10-CM

## 2021-12-08 DIAGNOSIS — O321XX0 Maternal care for breech presentation, not applicable or unspecified: Secondary | ICD-10-CM | POA: Insufficient documentation

## 2021-12-08 DIAGNOSIS — Z3A20 20 weeks gestation of pregnancy: Secondary | ICD-10-CM | POA: Insufficient documentation

## 2021-12-08 DIAGNOSIS — Z363 Encounter for antenatal screening for malformations: Secondary | ICD-10-CM | POA: Diagnosis present

## 2021-12-09 ENCOUNTER — Ambulatory Visit (INDEPENDENT_AMBULATORY_CARE_PROVIDER_SITE_OTHER): Payer: Medicaid Other | Admitting: Licensed Clinical Social Worker

## 2021-12-09 ENCOUNTER — Telehealth (INDEPENDENT_AMBULATORY_CARE_PROVIDER_SITE_OTHER): Payer: Medicaid Other

## 2021-12-09 DIAGNOSIS — O99342 Other mental disorders complicating pregnancy, second trimester: Secondary | ICD-10-CM

## 2021-12-09 DIAGNOSIS — F32A Depression, unspecified: Secondary | ICD-10-CM | POA: Diagnosis not present

## 2021-12-09 DIAGNOSIS — O99340 Other mental disorders complicating pregnancy, unspecified trimester: Secondary | ICD-10-CM

## 2021-12-09 DIAGNOSIS — Z3A20 20 weeks gestation of pregnancy: Secondary | ICD-10-CM

## 2021-12-09 DIAGNOSIS — Z3480 Encounter for supervision of other normal pregnancy, unspecified trimester: Secondary | ICD-10-CM

## 2021-12-09 MED ORDER — BLOOD PRESSURE KIT DEVI: 1.0000 | 0 refills | Status: DC

## 2021-12-09 NOTE — Progress Notes (Signed)
Pt states pharmacy  did not have blood pressure cuff available. Pt agrees to reach back out an see if bp cuff is available.

## 2021-12-09 NOTE — Progress Notes (Signed)
OBSTETRICS PRENATAL VIRTUAL VISIT ENCOUNTER NOTE  [facility]   Patient location: Home  I connected with Terry ClinesKiaheshia Carano on 12/09/21 at  1:30 PM EST by MyChart Video Encounter and verified that I am speaking with the correct person using two identifiers. I discussed the limitations, risks, security and privacy concerns of performing an evaluation and management service virtually and the availability of in person appointments. I also discussed with the patient that there may be a patient responsible charge related to this service. The patient expressed understanding and agreed to proceed. Subjective:  Terry [name] is a 26 y.o. [id number] at 487w5d being seen today for ongoing prenatal care.  She is currently monitored for the following issues for this low-risk pregnancy and has Supervision of other normal pregnancy, antepartum; Depression affecting pregnancy; History of prior pregnancy with short cervix, currently pregnant; and Unwanted fertility on their problem list.  Patient reports no complaints. However, she states  Contractions: Not present. Vag. Bleeding: None.  Movement: Present. Denies any leaking of fluid.   The following portions of the patient's history were reviewed and updated as appropriate: allergies, current medications, past family history, past medical history, past social history, past surgical history and problem list.   Objective:  There were no vitals filed for this visit.  Fetal Status:     Movement: Present     General:  Alert, oriented and cooperative. Patient is in no acute distress.  Respiratory: Normal respiratory effort, no problems with respiration noted  Mental Status: Normal mood and affect. Normal behavior. Normal judgment and thought content.  Rest of physical exam deferred due to type of encounter  Imaging: US MFM OB COMP + 14 WK  Result Date:  12/08/2021 ----------------------------------------------------------------------  OBSTETRICS REPORT                       (Signed Final 12/08/2021 02:58 pm) ---------------------------------------------------------------------- Patient Info  ID #:       [id number]                          D.O.B.:  [birth year] (25 yrs)  Name:       Terry ClinesKIAHESHIA Ducat                Visit Date: 12/08/2021 02:22 pm ---------------------------------------------------------------------- Performed By  Attending:        Lin Landsmanorenthian Booker      Ref. Address:     [address]                    [address]                                                             [address]                                                             [phone]  [place]                                                             [phone]  Performed By:     Eden Lathearrie Stalter BS      [facility]T                                 [facility]  Referred By:      [facility] ---------------------------------------------------------------------- Orders  #  Description                           Code        Ordered By  1  US MFM OB COMP + 14 WK                76805.01    Udell Blasingame ----------------------------------------------------------------------  #  Order #                     Accession #                Episode #  1  [id number]                   [id number]                 [id number] ---------------------------------------------------------------------- Indications  [redacted] weeks gestation of pregnancy                Z3A.20  Antenatal screening for malformations          Z36.3  Low risk NIPS, neg AFP/Horizon ---------------------------------------------------------------------- Fetal Evaluation  Num Of Fetuses:         1  Fetal Heart  Rate(bpm):  148  Cardiac Activity:       Observed  Presentation:           Breech  Placenta:               Anterior  P. Cord Insertion:      Visualized  Amniotic Fluid  AFI FV:      Within normal limits                              Largest Pocket(cm)  6.8 ---------------------------------------------------------------------- Biometry  BPD:      49.1  mm     G. Age:  20w 6d         61  %    CI:        71.77   %    70 - 86                                                          FL/HC:      17.1   %    15.9 - 20.3  HC:      184.5  mm     G. Age:  20w 6d         52  %    HC/AC:      1.24        1.06 - 1.25  AC:      148.8  mm     G. Age:  20w 1d         30  %    FL/BPD:     64.2   %  FL:       31.5  mm     G. Age:  19w 6d         17  %    FL/AC:      21.2   %    20 - 24  HUM:      31.9  mm     G. Age:  20w 5d         52  %  CER:      21.1  mm     G. Age:  20w 0d         49  %  NFT:       4.0  mm  CM:        4.6  mm  Est. FW:     331  gm    0 lb 12 oz      21  % ---------------------------------------------------------------------- OB History  Gravidity:    5         Term:   2        Prem:   0        SAB:   1  TOP:          1       Ectopic:  0        Living: 2 ---------------------------------------------------------------------- Gestational Age  LMP:           19w 5d        Date:  07/23/21                 EDD:   04/29/22  U/S Today:     20w 3d                                        EDD:   04/24/22  Best:          20w 4d     Det. By:  U/S C R L  (10/15/21)    EDD:   04/23/22 ---------------------------------------------------------------------- Anatomy  Cranium:  Appears normal         LVOT:                   Appears normal  Cavum:                 Appears normal         Aortic Arch:            Appears normal  Ventricles:            Appears normal         Ductal Arch:            Appears normal  Choroid Plexus:        Appears normal         Diaphragm:              Appears normal   Cerebellum:            Appears normal         Stomach:                Appears normal, left                                                                        sided  Posterior Fossa:       Appears normal         Abdomen:                Appears normal  Nuchal Fold:           Appears normal         Abdominal Wall:         Appears nml (cord                                                                        insert, abd wall)  Face:                  Appears normal         Cord Vessels:           Appears normal (3                         (orbits and profile)                           vessel cord)  Lips:                  Appears normal         Kidneys:                Appear normal  Palate:                Appears normal         Bladder:                Appears  normal  Thoracic:              Appears normal         Spine:                  Appears normal  Heart:                 Appears normal         Upper Extremities:      Appears normal                         (4CH, axis, and                         situs)  RVOT:                  Appears normal         Lower Extremities:      Appears normal  Other:  Heels/feet, open hands/5th digits, nasal bone, lenses. maxilla,          mandible, VC, 3VV and 3VTV visualized. Fetus appears to be a female. ---------------------------------------------------------------------- Cervix Uterus Adnexa  Cervix  Length:           3.83  cm.  Normal appearance by transabdominal scan.  Uterus  No abnormality visualized.  Right Ovary  Within normal limits.  Left Ovary  Within normal limits.  Cul De Sac  No free fluid seen.  Adnexa  No abnormality visualized. ---------------------------------------------------------------------- Impression  Single intrauterine pregnancy here for a detailed anatomy  Normal anatomy with measurements consistent with dates  There is good fetal movement and amniotic fluid volume ---------------------------------------------------------------------- Recommendations  Follow  up growth in clinically indicated. ----------------------------------------------------------------------               Lin Landsman, MD Electronically Signed Final Report   12/08/2021 02:58 pm ----------------------------------------------------------------------   Assessment and Plan:  Pregnancy: [id number] at [redacted]w[redacted]d 1. Supervision of other normal pregnancy, antepartum -Anticipatory guidance for upcoming appts. -Patient to schedule next appt in 4 weeks for a virtual visit. -Plan to resend prescription for blood pressure cuff. -Information on taking bp provided in AVS  2. [redacted] weeks gestation of pregnancy -Doing well in regards to pregnancy. -Reviewed [place]. No q/c.  3. Depression affecting pregnancy -Taking Zoloft -Reports stressors related to social issues.  -Provider allows for venting and emotional support given in the form of active listening.   -Patient to meet with Sue Lush, LCSW today.  Preterm labor symptoms and general obstetric precautions including but not limited to vaginal bleeding, contractions, leaking of fluid and fetal movement were reviewed in detail with the patient. I discussed the assessment and treatment plan with the patient. The patient was provided an opportunity to ask questions and all were answered. The patient agreed with the plan and demonstrated an understanding of the instructions. The patient was advised to call back or seek an in-person office evaluation/go to MAU at [facility] for any urgent or concerning symptoms. Please refer to After Visit Summary for other counseling recommendations.   I provided 15 minutes of face-to-face time during this encounter.  No follow-ups on file.  Future Appointments  Date Time Provider Department Center  12/09/2021  2:30 PM Gwyndolyn Saxon, LCSW [facility] None    Cherre Robins, CNM [facility] [employer]

## 2021-12-10 NOTE — BH Specialist Note (Signed)
Integrated Behavioral Health via Telemedicine Visit  12/10/2021 Terry [name] [id number]  Number of Integrated Behavioral Health visits: 2 Session Start time: 220pm  Session End time: 243pm Total time: 23 mins via mychart video   Referring Provider: Sabas [name] Patient/Family location: home  [facility] Provider location: [place]  All persons participating in visit: Terry [name] and LCSW Terry [name]  Types of Service: Individual psychotherapy and Video visit  I connected with Terry [name] and/or Terry [name] n/a via  Telephone or Video Enabled Telemedicine Application  (Video is Caregility application) and verified that I am speaking with the correct person using two identifiers. Discussed confidentiality: yes   I discussed the limitations of telemedicine and the availability of in person appointments.  Discussed there is a possibility of technology failure and discussed alternative modes of communication if that failure occurs.  I discussed that engaging in this telemedicine visit, they consent to the provision of behavioral healthcare and the services will be billed under their insurance.  Patient and/or legal guardian expressed understanding and consented to Telemedicine visit: Yes   Presenting Concerns: Patient and/or family reports the following symptoms/concerns: family conflict and depressed mood  Duration of problem: over one year ; Severity of problem: mild  Patient and/or Family's Strengths/Protective Factors: Concrete supports in place (healthy food, safe environments, etc.)  Goals Addressed: Patient will:  Reduce symptoms of: depression   Increase knowledge and/or ability of: coping skills   Demonstrate ability to: Increase healthy adjustment to current life circumstances  Progress towards Goals: Achieved  Interventions: Interventions utilized:  Supportive Counseling Standardized Assessments completed: PHQ 9  Patient and/or Family Response: Terry [name] responded well  to mychart video   Assessment: Patient currently experiencing depression affecting pregnancy.   Patient may benefit from integrated behavioral health.  Plan: Follow up with behavioral health clinician on : prn  Behavioral recommendations:  prioritze rest, intentionally schedule self care to reduce stress, practice mindfulness tech and prenatal yoga and attend all scheduled medical appts Referral(s): Integrated [employer] (In Clinic)  I discussed the assessment and treatment plan with the patient and/or parent/guardian. They were provided an opportunity to ask questions and all were answered. They agreed with the plan and demonstrated an understanding of the instructions.   They were advised to call back or seek an in-person evaluation if the symptoms worsen or if the condition fails to improve as anticipated.  Terry Saxon, LCSW

## 2021-12-14 ENCOUNTER — Inpatient Hospital Stay (HOSPITAL_COMMUNITY)
Admission: AD | Admit: 2021-12-14 | Discharge: 2021-12-14 | Disposition: A | Discharge status: Home / Self Care | Payer: Medicaid Other | Attending: Obstetrics & Gynecology | Admitting: Obstetrics & Gynecology

## 2021-12-14 ENCOUNTER — Encounter: Payer: Self-pay | Admitting: Certified Nurse Midwife

## 2021-12-14 ENCOUNTER — Other Ambulatory Visit: Payer: Self-pay

## 2021-12-14 ENCOUNTER — Encounter (HOSPITAL_COMMUNITY): Payer: Self-pay | Admitting: Obstetrics & Gynecology

## 2021-12-14 DIAGNOSIS — O98512 Other viral diseases complicating pregnancy, second trimester: Secondary | ICD-10-CM | POA: Diagnosis not present

## 2021-12-14 DIAGNOSIS — O99512 Diseases of the respiratory system complicating pregnancy, second trimester: Secondary | ICD-10-CM | POA: Diagnosis present

## 2021-12-14 DIAGNOSIS — J069 Acute upper respiratory infection, unspecified: Secondary | ICD-10-CM

## 2021-12-14 DIAGNOSIS — Z3A21 21 weeks gestation of pregnancy: Secondary | ICD-10-CM | POA: Insufficient documentation

## 2021-12-14 DIAGNOSIS — Z20822 Contact with and (suspected) exposure to covid-19: Secondary | ICD-10-CM | POA: Diagnosis not present

## 2021-12-14 LAB — RESP PANEL BY RT-PCR (FLU A&B, COVID) ARPGX2
Influenza A by PCR: NEGATIVE
Influenza B by PCR: NEGATIVE
SARS Coronavirus 2 by RT PCR: NEGATIVE

## 2021-12-14 NOTE — MAU Provider Note (Signed)
History     CSN: [id number]  Arrival date and time: 12/14/21 1756    Chief Complaint  Patient presents with   Chills   Nasal Congestion   Anorexia   HPI This is a 26 yo G5P2022 at [redacted]w[redacted]d who is here with flulike symptoms.  She had a cough, fevers, body aches over the past week.  She has not tried any of the over-the-counter medications.  Denies vomiting, diarrhea, abdominal pain.  OB History     Gravida  5   Para  2   Term  2   Preterm      AB  2   Living  2      SAB  1   IAB  1   Ectopic      Multiple  0   Live Births  2           Past Medical History:  Diagnosis Date   Chlamydia    Depression    Gonorrhea    Pregnancy induced hypertension     Past Surgical History:  Procedure Laterality Date   NO PAST SURGERIES      Family History  Problem Relation Age of Onset   Cancer Maternal Grandmother        pancreatic   Diabetes Maternal Uncle    Diabetes Mother    Hypertension Mother    Diabetes Father     Social History   Tobacco Use   Smoking status: Never   Smokeless tobacco: Never  Vaping Use   Vaping Use: Never used  Substance Use Topics   Alcohol use: No   Drug use: No    Allergies: No Known Allergies  Medications Prior to Admission  Medication Sig Dispense Refill Last Dose   Prenatal Vit-Fe Phos-FA-Omega (VITAFOL GUMMIES) 3.33-0.333-34.8 MG CHEW Chew 1 tablet by mouth daily. 90 tablet 4 12/14/2021   promethazine (PHENERGAN) 25 MG tablet Take 1 tablet (25 mg total) by mouth every 6 (six) hours as needed for nausea or vomiting. 30 tablet 2 12/13/2021   sertraline (ZOLOFT) 50 MG tablet Take 1 tablet (50 mg total) by mouth daily. 30 tablet 11 12/14/2021   Blood Pressure Monitoring (BLOOD PRESSURE KIT) DEVI 1 kit by Does not apply route once a week. Check Blood Pressure regularly and record readings into the Bab[employer] App.  Large Cuff.  DX O90.0 1 each 0     Review of Systems Physical Exam   Blood pressure 100/69, pulse 97,  temperature 98.5 F (36.9 C), temperature source Oral, resp. rate 20, last menstrual period 07/23/2021, SpO2 99 %, unknown if currently breastfeeding.  Physical Exam Vitals and nursing note reviewed. Exam conducted with a chaperone present.  Constitutional:      Appearance: Normal appearance.  Cardiovascular:     Rate and Rhythm: Normal rate and regular rhythm.     Pulses: Normal pulses.     Heart sounds: Normal heart sounds.  Pulmonary:     Effort: Pulmonary effort is normal.     Breath sounds: Normal breath sounds.  Skin:    General: Skin is warm and dry.     Capillary Refill: Capillary refill takes less than 2 seconds.  Neurological:     General: No focal deficit present.     Mental Status: She is alert.  Psychiatric:        Mood and Affect: Mood normal.        Behavior: Behavior normal.        Thought Content: Thought  content normal.        Judgment: Judgment normal.    MAU Course  Procedures  MDM Patient stable  Assessment and Plan   1. [redacted] weeks gestation of pregnancy   2. Viral upper respiratory tract infection    COVID/flu swab collected. Will discharge patient and call with results. OTC medications discussed: tylenol, robitussin DM.  Truett Mainland 12/14/2021, 7:37 PM

## 2021-12-14 NOTE — MAU Note (Signed)
Been sick for the past wk, congested, runny nose, chills, loss of appetite.  Did not call dr, did not check temp. Dry cough. Denies vomiting or diarrhea. No bleeding or OB complaints.

## 2021-12-14 NOTE — Discharge Instructions (Signed)
You can use Robitussin DM every 6 hours for cough and congestion You can use tylenol 650mg  every 6 hours for fever.

## 2021-12-14 NOTE — MAU Note (Signed)
Has not taken any Tylenol or any type of cold medicines, was unsure of what to take.

## 2022-01-06 ENCOUNTER — Ambulatory Visit (INDEPENDENT_AMBULATORY_CARE_PROVIDER_SITE_OTHER): Payer: Medicaid Other | Admitting: Advanced Practice Midwife

## 2022-01-06 ENCOUNTER — Encounter: Payer: Self-pay | Admitting: Advanced Practice Midwife

## 2022-01-06 ENCOUNTER — Other Ambulatory Visit: Payer: Self-pay

## 2022-01-06 VITALS — BP 112/70 | HR 105 | Wt 187.5 lb

## 2022-01-06 DIAGNOSIS — N898 Other specified noninflammatory disorders of vagina: Secondary | ICD-10-CM

## 2022-01-06 DIAGNOSIS — Z3480 Encounter for supervision of other normal pregnancy, unspecified trimester: Secondary | ICD-10-CM

## 2022-01-06 DIAGNOSIS — Z3A24 24 weeks gestation of pregnancy: Secondary | ICD-10-CM

## 2022-01-06 DIAGNOSIS — O26892 Other specified pregnancy related conditions, second trimester: Secondary | ICD-10-CM

## 2022-01-06 LAB — POCT URINALYSIS DIPSTICK
Bilirubin, UA: NEGATIVE
Blood, UA: NEGATIVE
Glucose, UA: NEGATIVE
Ketones, UA: NEGATIVE
Protein, UA: NEGATIVE
Spec Grav, UA: 1.015 (ref 1.010–1.025)
pH, UA: 7 (ref 5.0–8.0)

## 2022-01-06 NOTE — Progress Notes (Signed)
Pt presents for ROB visit. She has concerns about a possible yeast infection.  ?

## 2022-01-06 NOTE — Progress Notes (Signed)
? ?  PRENATAL VISIT NOTE ? ?Subjective:  ?Terry [name] is a 26 y.o. H6W7371 at [redacted]w[redacted]d being seen today for ongoing prenatal care.  She is currently monitored for the following issues for this low-risk pregnancy and has Supervision of other normal pregnancy, antepartum; Depression affecting pregnancy; History of prior pregnancy with short cervix, currently pregnant; and Unwanted fertility on their problem list. ? ?Patient reports  vaginal discharge .  Contractions: Not present. Vag. Bleeding: None.  Movement: Present. Denies leaking of fluid.  ? ?The following portions of the patient's history were reviewed and updated as appropriate: allergies, current medications, past family history, past medical history, past social history, past surgical history and problem list.  ? ?Objective:  ? ?Vitals:  ? 01/06/22 1451  ?BP: 112/70  ?Pulse: (!) 105  ?Weight: 187 lb 8 oz (85 kg)  ? ? ?Fetal Status: Fetal Heart Rate (bpm): 145 Fundal Height: 25 cm Movement: Present    ? ?General:  Alert, oriented and cooperative. Patient is in no acute distress.  ?Skin: Skin is warm and dry. No rash noted.   ?Cardiovascular: Normal heart rate noted  ?Respiratory: Normal respiratory effort, no problems with respiration noted  ?Abdomen: Soft, gravid, appropriate for gestational age.  Pain/Pressure: Absent     ?Pelvic: Cervical exam deferred        ?Extremities: Normal range of motion.  Edema: None  ?Mental Status: Normal mood and affect. Normal behavior. Normal judgment and thought content.  ? ?Assessment and Plan:  ?Pregnancy: G6Y6948 at [redacted]w[redacted]d ?1. Supervision of other normal pregnancy, antepartum ?--Anticipatory guidance about next visits/weeks of pregnancy given. ?--Next appt in 4 weeks for GTT ? ?- POCT Urinalysis Dipstick ? ?2. [redacted] weeks gestation of pregnancy ? ? ?3. Vaginal discharge during pregnancy in second trimester ?--Pt describes "stringy" white/clear discharge without itching/burning/odor ?--Discussed normal pregnancy discharge,  physiologic changes of pregnancy ?--Pt to f/u if becomes watery, is associated with pain or bleeding, or has itching/burning/odor ? ?Preterm labor symptoms and general obstetric precautions including but not limited to vaginal bleeding, contractions, leaking of fluid and fetal movement were reviewed in detail with the patient. ?Please refer to After Visit Summary for other counseling recommendations.  ? ?Return in about 4 weeks (around 02/03/2022). ? ?Future Appointments  ?Date Time Provider Department Center  ?02/03/2022  8:30 AM [facility] None  ?02/03/2022  8:55 AM Brock Bad, MD [facility] None  ? ? ?Sharen Counter, CNM  ?

## 2022-01-19 ENCOUNTER — Encounter: Payer: Self-pay | Admitting: *Deleted

## 2022-02-03 ENCOUNTER — Other Ambulatory Visit: Payer: Self-pay

## 2022-02-03 ENCOUNTER — Encounter: Payer: Self-pay | Admitting: Obstetrics

## 2022-02-03 ENCOUNTER — Ambulatory Visit (INDEPENDENT_AMBULATORY_CARE_PROVIDER_SITE_OTHER): Payer: Medicaid Other | Admitting: Obstetrics

## 2022-02-03 ENCOUNTER — Other Ambulatory Visit (HOSPITAL_COMMUNITY)
Admission: RE | Admit: 2022-02-03 | Discharge: 2022-02-03 | Disposition: A | Discharge status: Home / Self Care | Payer: Medicaid Other | Source: Ambulatory Visit | Attending: Obstetrics | Admitting: Obstetrics

## 2022-02-03 ENCOUNTER — Other Ambulatory Visit: Payer: Medicaid Other

## 2022-02-03 VITALS — BP 112/74 | HR 87 | Wt 192.4 lb

## 2022-02-03 DIAGNOSIS — Z3480 Encounter for supervision of other normal pregnancy, unspecified trimester: Secondary | ICD-10-CM | POA: Diagnosis not present

## 2022-02-03 DIAGNOSIS — N898 Other specified noninflammatory disorders of vagina: Secondary | ICD-10-CM | POA: Diagnosis not present

## 2022-02-03 DIAGNOSIS — Z23 Encounter for immunization: Secondary | ICD-10-CM | POA: Diagnosis not present

## 2022-02-03 NOTE — Addendum Note (Signed)
Addended by: Jearld Adjutant on: 02/03/2022 09:39 AM ? ? Modules accepted: Orders ? ?

## 2022-02-03 NOTE — Progress Notes (Signed)
Subjective:  ?Terry [name] is a 26 y.o. A6T0160 at [redacted]w[redacted]d being seen today for ongoing prenatal care.  She is currently monitored for the following issues for this low-risk pregnancy and has Supervision of other normal pregnancy, antepartum; Depression affecting pregnancy; History of prior pregnancy with short cervix, currently pregnant; and Unwanted fertility on their problem list. ? ?Patient reports  mucous discharge .  Contractions: Not present. Vag. Bleeding: None.  Movement: Present. Denies leaking of fluid.  ? ?The following portions of the patient's history were reviewed and updated as appropriate: allergies, current medications, past family history, past medical history, past social history, past surgical history and problem list. Problem list updated. ? ?Objective:  ? ?Vitals:  ? 02/03/22 0836  ?BP: 112/74  ?Pulse: 87  ?Weight: 192 lb 6.4 oz (87.3 kg)  ? ? ?Fetal Status: Fetal Heart Rate (bpm): 144   Movement: Present    ? ?General:  Alert, oriented and cooperative. Patient is in no acute distress.  ?Skin: Skin is warm and dry. No rash noted.   ?Cardiovascular: Normal heart rate noted  ?Respiratory: Normal respiratory effort, no problems with respiration noted  ?Abdomen: Soft, gravid, appropriate for gestational age. Pain/Pressure: Present     ?Pelvic:  Cervical exam performed      Long / closed.  Wet prep and cultures done.  ?Extremities: Normal range of motion.  Edema: None  ?Mental Status: Normal mood and affect. Normal behavior. Normal judgment and thought content.  ? ?Urinalysis:     ? ?Assessment and Plan:  ?Pregnancy: [id number] at [redacted]w[redacted]d ? ?1. Supervision of other normal pregnancy, antepartum ?Rx: ?- Tdap vaccine greater than or equal to 7yo IM ? ?2. Vaginal discharge ?Rx: ?- Cervicovaginal ancillary only ? ? ?Preterm labor symptoms and general obstetric precautions including but not limited to vaginal bleeding, contractions, leaking of fluid and fetal movement were reviewed in detail with the  patient. ?Please refer to After Visit Summary for other counseling recommendations.  ? ?Return in about 2 weeks (around 02/17/2022) for ROB. ? ? ?Brock Bad, MD  ?02/03/22  ?

## 2022-02-03 NOTE — Progress Notes (Signed)
ROB in office for ROB and GTT. Pt has concerns about about possibly loosing mucous plug. Pt c/o of thick , clear vaginal discharge in "globs" . Pt has noticed this in 3 different occasions over the last few weeks.  ?

## 2022-02-04 LAB — CERVICOVAGINAL ANCILLARY ONLY
Bacterial Vaginitis (gardnerella): NEGATIVE
Candida Glabrata: NEGATIVE
Candida Vaginitis: NEGATIVE
Chlamydia: NEGATIVE
Comment: NEGATIVE
Comment: NEGATIVE
Comment: NEGATIVE
Comment: NEGATIVE
Comment: NEGATIVE
Comment: NORMAL
Neisseria Gonorrhea: NEGATIVE
Trichomonas: NEGATIVE

## 2022-02-04 LAB — GLUCOSE TOLERANCE, 2 HOURS W/ 1HR
Glucose, 1 hour: 130 mg/dL (ref 70–179)
Glucose, 2 hour: 124 mg/dL (ref 70–152)
Glucose, Fasting: 91 mg/dL (ref 70–91)

## 2022-02-04 LAB — CBC
Hematocrit: 35.7 % (ref 34.0–46.6)
Hemoglobin: 11.9 g/dL (ref 11.1–15.9)
MCH: 31.9 pg (ref 26.6–33.0)
MCHC: 33.3 g/dL (ref 31.5–35.7)
MCV: 96 fL (ref 79–97)
Platelets: 261 10*3/uL (ref 150–450)
RBC: 3.73 x10E6/uL — ABNORMAL LOW (ref 3.77–5.28)
RDW: 12.3 % (ref 11.7–15.4)
WBC: 9.7 10*3/uL (ref 3.4–10.8)

## 2022-02-04 LAB — HIV ANTIBODY (ROUTINE TESTING W REFLEX): HIV Screen 4th Generation wRfx: NONREACTIVE

## 2022-02-04 LAB — SYPHILIS: RPR W/REFLEX TO RPR TITER AND TREPONEMAL ANTIBODIES, TRADITIONAL SCREENING AND DIAGNOSIS ALGORITHM: RPR Ser Ql: NONREACTIVE

## 2022-02-17 ENCOUNTER — Telehealth (INDEPENDENT_AMBULATORY_CARE_PROVIDER_SITE_OTHER): Payer: Medicaid Other | Admitting: Obstetrics and Gynecology

## 2022-02-17 ENCOUNTER — Encounter: Payer: Self-pay | Admitting: Obstetrics and Gynecology

## 2022-02-17 DIAGNOSIS — O99340 Other mental disorders complicating pregnancy, unspecified trimester: Secondary | ICD-10-CM

## 2022-02-17 DIAGNOSIS — O09293 Supervision of pregnancy with other poor reproductive or obstetric history, third trimester: Secondary | ICD-10-CM

## 2022-02-17 DIAGNOSIS — O99343 Other mental disorders complicating pregnancy, third trimester: Secondary | ICD-10-CM

## 2022-02-17 DIAGNOSIS — F32A Depression, unspecified: Secondary | ICD-10-CM

## 2022-02-17 DIAGNOSIS — Z3480 Encounter for supervision of other normal pregnancy, unspecified trimester: Secondary | ICD-10-CM

## 2022-02-17 DIAGNOSIS — O09299 Supervision of pregnancy with other poor reproductive or obstetric history, unspecified trimester: Secondary | ICD-10-CM

## 2022-02-17 DIAGNOSIS — Z3A30 30 weeks gestation of pregnancy: Secondary | ICD-10-CM

## 2022-02-17 MED ORDER — CYCLOBENZAPRINE HCL 10 MG PO TABS: 10.0000 mg | ORAL_TABLET | Freq: Three times a day (TID) | ORAL | 2 refills | Status: DC | PRN

## 2022-02-17 NOTE — Patient Instructions (Signed)

## 2022-02-17 NOTE — Progress Notes (Signed)
Pt being seen virtually. Pt c/o of lower back pain that makes it hard to walk and causing leg cramps. Back pain last for a few hours and goes away. Pt c/o of a lot of clear, gooey discharge that does not have an odor for several weeks. No other concerns at this time.  ?

## 2022-02-17 NOTE — Progress Notes (Signed)
I connected with Terry [name] 02/17/22 at 10:55 AM EDT by: MyChart video and verified that I am speaking with the correct person using two identifiers.  Patient is located at Home and provider is located at [place].   ?  ?The purpose of this virtual visit is to provide medical care while limiting exposure to the novel coronavirus. I discussed the limitations, risks, security and privacy concerns of performing an evaluation and management service by MyChart video and the availability of in person appointments. I also discussed with the patient that there may be a patient responsible charge related to this service. By engaging in this virtual visit, you consent to the provision of healthcare.  Additionally, you authorize for your insurance to be billed for the services provided during this visit.  The patient expressed understanding and agreed to proceed. ? ? ? ? ? ?PRENATAL VISIT NOTE ? ?Subjective:  ?Terry [name] is a 26 y.o. A2Q3335 at [redacted]w[redacted]d  for phone visit for ongoing prenatal care.  She is currently monitored for the following issues for this low-risk pregnancy and has Supervision of other normal pregnancy, antepartum; Depression affecting pregnancy; History of prior pregnancy with short cervix, currently pregnant; and Unwanted fertility on their problem list. ? ?Patient reports backache.  Contractions: Irritability. Vag. Bleeding: None.  Movement: Present. Denies leaking of fluid.  ? ?The following portions of the patient's history were reviewed and updated as appropriate: allergies, current medications, past family history, past medical history, past social history, past surgical history and problem list.  ? ?Objective:  ?There were no vitals filed for this visit. Self-Obtained ? ?Fetal Status:     Movement: Present    ? ?Assessment and Plan:  ?Pregnancy: K5G2563 at [redacted]w[redacted]d ?1. Supervision of other normal pregnancy, antepartum ?Stable ?- cyclobenzaprine (FLEXERIL) 10 MG tablet; Take 1 tablet (10 mg total)  by mouth 3 (three) times daily as needed for muscle spasms.  Dispense: 30 tablet; Refill: 2 ? ?2. Depression affecting pregnancy ?Stable ?Continue with Zoloft ? ?3. History of prior pregnancy with short cervix, currently pregnant ?Stable ? ?Preterm labor symptoms and general obstetric precautions including but not limited to vaginal bleeding, contractions, leaking of fluid and fetal movement were reviewed in detail with the patient. ? ?Return in about 2 weeks (around 03/03/2022) for OB visit, face to face, any provider. ? ?No future appointments. ? ? ?Time spent on virtual visit: 8 minutes ? ?Hermina Staggers, MD ?

## 2022-02-20 ENCOUNTER — Other Ambulatory Visit: Payer: Self-pay

## 2022-02-20 ENCOUNTER — Encounter (HOSPITAL_COMMUNITY): Payer: Self-pay | Admitting: Obstetrics & Gynecology

## 2022-02-20 ENCOUNTER — Inpatient Hospital Stay (HOSPITAL_COMMUNITY)
Admission: AD | Admit: 2022-02-20 | Discharge: 2022-02-20 | Disposition: A | Discharge status: Home / Self Care | Payer: Medicaid Other | Attending: Obstetrics & Gynecology | Admitting: Obstetrics & Gynecology

## 2022-02-20 DIAGNOSIS — O23593 Infection of other part of genital tract in pregnancy, third trimester: Secondary | ICD-10-CM | POA: Insufficient documentation

## 2022-02-20 DIAGNOSIS — Z3A31 31 weeks gestation of pregnancy: Secondary | ICD-10-CM | POA: Insufficient documentation

## 2022-02-20 DIAGNOSIS — O98813 Other maternal infectious and parasitic diseases complicating pregnancy, third trimester: Secondary | ICD-10-CM | POA: Insufficient documentation

## 2022-02-20 DIAGNOSIS — B3731 Acute candidiasis of vulva and vagina: Secondary | ICD-10-CM | POA: Diagnosis not present

## 2022-02-20 DIAGNOSIS — O26893 Other specified pregnancy related conditions, third trimester: Secondary | ICD-10-CM | POA: Diagnosis present

## 2022-02-20 DIAGNOSIS — R102 Pelvic and perineal pain: Secondary | ICD-10-CM | POA: Diagnosis not present

## 2022-02-20 DIAGNOSIS — O98819 Other maternal infectious and parasitic diseases complicating pregnancy, unspecified trimester: Secondary | ICD-10-CM | POA: Diagnosis not present

## 2022-02-20 LAB — WET PREP, GENITAL
Clue Cells Wet Prep HPF POC: NONE SEEN
Sperm: NONE SEEN
Trich, Wet Prep: NONE SEEN
WBC, Wet Prep HPF POC: <10 (ref <10)
Yeast Wet Prep HPF POC: NONE SEEN

## 2022-02-20 LAB — URINALYSIS, ROUTINE W REFLEX MICROSCOPIC
Bilirubin Urine: NEGATIVE
Glucose, UA: NEGATIVE mg/dL
Hgb urine dipstick: NEGATIVE
Ketones, ur: 5 mg/dL — AB
Leukocytes,Ua: NEGATIVE
Nitrite: NEGATIVE
Protein, ur: NEGATIVE mg/dL
Specific Gravity, Urine: 1.018 (ref 1.005–1.030)
pH: 6 (ref 5.0–8.0)

## 2022-02-20 MED ORDER — TRAMADOL HCL 50 MG PO TABS: 50.0000 mg | ORAL_TABLET | Freq: Four times a day (QID) | ORAL | 0 refills | Status: DC | PRN

## 2022-02-20 MED ORDER — TRAMADOL HCL 50 MG PO TABS
50.0000 mg | ORAL_TABLET | Freq: Once | ORAL | Status: AC
  Administered 2022-02-20: 50 mg via ORAL
  Filled 2022-02-20: qty 1

## 2022-02-20 MED ORDER — TERCONAZOLE 0.4 % VA CREA
1.0000 | TOPICAL_CREAM | Freq: Every day | VAGINAL | 0 refills | Status: DC
Start: 2022-02-20 — End: 2022-04-14

## 2022-02-20 NOTE — Discharge Instructions (Signed)

## 2022-02-20 NOTE — MAU Provider Note (Signed)
?History  ?  ? ?CSN: [id number] ? ?Arrival date and time: 02/20/22 1259 ? ? Event Date/Time  ? First Provider Initiated Contact with Patient 02/20/22 1317   ?  ? ?Chief Complaint  ?Patient presents with  ? Pelvic Pressure  ? ?HPI ?Terry [name] is a 26 y.o. [id number] at [redacted]w[redacted]d who presents with pelvic pressure. She states she has had pressure throughout the pregnancy but the last several days has been worse. She reports she has tried both flexeril and tylenol at noon with no relief. She denies any contractions, bleeding or leaking. She reports normal fetal movement.  ? ?OB History   ? ? Gravida  ?5  ? Para  ?2  ? Term  ?2  ? Preterm  ?   ? AB  ?2  ? Living  ?2  ?  ? ? SAB  ?1  ? IAB  ?1  ? Ectopic  ?   ? Multiple  ?0  ? Live Births  ?2  ?   ?  ?  ? ? ?Past Medical History:  ?Diagnosis Date  ? Chlamydia   ? Depression   ? Gonorrhea   ? Pregnancy induced hypertension   ? ? ?Past Surgical History:  ?Procedure Laterality Date  ? NO PAST SURGERIES    ? ? ?Family History  ?Problem Relation Age of Onset  ? Cancer Maternal Grandmother   ?     pancreatic  ? Diabetes Maternal Uncle   ? Diabetes Mother   ? Hypertension Mother   ? Diabetes Father   ? ? ?Social History  ? ?Tobacco Use  ? Smoking status: Never  ? Smokeless tobacco: Never  ?Vaping Use  ? Vaping Use: Never used  ?Substance Use Topics  ? Alcohol use: No  ? Drug use: No  ? ? ?Allergies: No Known Allergies ? ?Medications Prior to Admission  ?Medication Sig Dispense Refill Last Dose  ? cyclobenzaprine (FLEXERIL) 10 MG tablet Take 1 tablet (10 mg total) by mouth 3 (three) times daily as needed for muscle spasms. 30 tablet 2 02/20/2022  ? Blood Pressure Monitoring (BLOOD PRESSURE KIT) DEVI 1 kit by Does not apply route once a week. Check Blood Pressure regularly and record readings into the [employer] App.  Large Cuff.  DX O90.0 1 each 0   ? Prenatal Vit-Fe Phos-FA-Omega (VITAFOL GUMMIES) 3.33-0.333-34.8 MG CHEW Chew 1 tablet by mouth daily. 90 tablet 4   ? promethazine  (PHENERGAN) 25 MG tablet Take 1 tablet (25 mg total) by mouth every 6 (six) hours as needed for nausea or vomiting. 30 tablet 2   ? sertraline (ZOLOFT) 50 MG tablet Take 1 tablet (50 mg total) by mouth daily. 30 tablet 11   ? ? ?Review of Systems  ?Constitutional: Negative.  Negative for fatigue and fever.  ?HENT: Negative.    ?Respiratory: Negative.  Negative for shortness of breath.   ?Cardiovascular: Negative.  Negative for chest pain.  ?Gastrointestinal: Negative.  Negative for abdominal pain, constipation, diarrhea, nausea and vomiting.  ?Genitourinary:  Positive for pelvic pain. Negative for dysuria, vaginal bleeding and vaginal discharge.  ?Neurological: Negative.  Negative for dizziness and headaches.  ?Physical Exam  ? ?Blood pressure 116/77, pulse (!) 116, temperature 98.3 ?F (36.8 ?C), temperature source Oral, resp. rate 18, last menstrual period 07/23/2021, SpO2 98 %, unknown if currently breastfeeding. ? ?Physical Exam ?Vitals and nursing note reviewed.  ?Constitutional:   ?   General: She is not in acute distress. ?   Appearance: She  is well-developed.  ?HENT:  ?   Head: Normocephalic.  ?Eyes:  ?   Pupils: Pupils are equal, round, and reactive to light.  ?Cardiovascular:  ?   Rate and Rhythm: Normal rate and regular rhythm.  ?   Heart sounds: Normal heart sounds.  ?Pulmonary:  ?   Effort: Pulmonary effort is normal. No respiratory distress.  ?   Breath sounds: Normal breath sounds.  ?Abdominal:  ?   General: Bowel sounds are normal. There is no distension.  ?   Palpations: Abdomen is soft.  ?   Tenderness: There is no abdominal tenderness.  ?Genitourinary: ?   Vagina: Vaginal discharge (thick white adherent discharge) present.  ?Skin: ?   General: Skin is warm and dry.  ?Neurological:  ?   Mental Status: She is alert and oriented to person, place, and time.  ?Psychiatric:     ?   Mood and Affect: Mood normal.     ?   Behavior: Behavior normal.     ?   Thought Content: Thought content normal.     ?    Judgment: Judgment normal.  ? ?Fetal Tracing: ? ?Baseline: 130 ?Variability: moderate ?Accels: 15x15 ?Decels: none ? ?Toco: none ? ?Dilation: Closed ?Effacement (%): Thick ?Exam by:: Kaylie Ritter cnm ? ? ?MAU Course  ?Procedures ?Results for orders placed or performed during the hospital encounter of 02/20/22 (from the past 24 hour(s))  ?Wet prep, genital     Status: None  ? Collection Time: 02/20/22  1:24 PM  ?Result Value Ref Range  ? Yeast Wet Prep HPF POC NONE SEEN NONE SEEN  ? Trich, Wet Prep NONE SEEN NONE SEEN  ? Clue Cells Wet Prep HPF POC NONE SEEN NONE SEEN  ? WBC, Wet Prep HPF POC <10 <10  ? Sperm NONE SEEN   ?Urinalysis, Routine w reflex microscopic Urine, Clean Catch     Status: Abnormal  ? Collection Time: 02/20/22  1:39 PM  ?Result Value Ref Range  ? Color, Urine YELLOW YELLOW  ? APPearance CLEAR CLEAR  ? Specific Gravity, Urine 1.018 1.005 - 1.030  ? pH 6.0 5.0 - 8.0  ? Glucose, UA NEGATIVE NEGATIVE mg/dL  ? Hgb urine dipstick NEGATIVE NEGATIVE  ? Bilirubin Urine NEGATIVE NEGATIVE  ? Ketones, ur 5 (A) NEGATIVE mg/dL  ? Protein, ur NEGATIVE NEGATIVE mg/dL  ? Nitrite NEGATIVE NEGATIVE  ? Leukocytes,Ua NEGATIVE NEGATIVE  ?  ?MDM ?UA ?Wet prep and gc/chlamydia ?Ultram  ? ?Patient reports complete resolution of pain  ? ?Assessment and Plan  ? ?1. Candidiasis of vagina during pregnancy   ?2. [redacted] weeks gestation of pregnancy   ?3. Pelvic pain affecting pregnancy in third trimester, antepartum   ? ?-Discharge home in stable condition ?-Rx for terazol and limited RX of pain medication ?-Preterm precautions discussed ?-Patient advised to follow-up with OB as scheduled for prenatal care ?-Patient may return to MAU as needed or if her condition were to change or worsen ? ? ?Wende Mott CNM ?02/20/2022, 1:17 PM  ?

## 2022-02-20 NOTE — MAU Note (Signed)
Terry [name] is a 26 y.o. at [redacted]w[redacted]d here in MAU reporting: pelvic pressure for the last 2 days, states mostly when standing and laying down. Denies any contractions, bleeding, or LOF. +FM. Has been taking tylenol and a muscle relaxer. ? ?Onset of complaint: ongoing ? ?Pain score: 5/10 ? ?Vitals:  ? 02/20/22 1314  ?BP: 116/77  ?Pulse: (!) 116  ?Resp: 18  ?Temp: 98.3 ?F (36.8 ?C)  ?SpO2: 98%  ?   ?FHT:+FM, EFM applied in room ? ?Lab orders placed from triage: UA ? ?

## 2022-02-22 LAB — GC/CHLAMYDIA PROBE AMP (~~LOC~~) NOT AT ARMC
Chlamydia: NEGATIVE
Comment: NEGATIVE
Comment: NORMAL
Neisseria Gonorrhea: NEGATIVE

## 2022-03-04 ENCOUNTER — Encounter: Payer: Self-pay | Admitting: Obstetrics

## 2022-03-04 ENCOUNTER — Telehealth (INDEPENDENT_AMBULATORY_CARE_PROVIDER_SITE_OTHER): Payer: Medicaid Other | Admitting: Obstetrics

## 2022-03-04 DIAGNOSIS — Z3480 Encounter for supervision of other normal pregnancy, unspecified trimester: Secondary | ICD-10-CM

## 2022-03-04 NOTE — Progress Notes (Signed)
Virtual Visit via Telephone Note ? ?I connected with Darrelyn Hillock on 03/04/22 at  2:50 PM EDT by telephone and verified that I am speaking with the correct person using two identifiers. ? ?Location: ?Patient: Home ?Provider: Merla Riches  ?  ? ?

## 2022-03-04 NOTE — Progress Notes (Signed)
Subjective:  ?Terry [name] is a 26 y.o. [id number] at [redacted]w[redacted]d being seen today for ongoing prenatal care.  She is currently monitored for the following issues for this low-risk pregnancy and has Supervision of other normal pregnancy, antepartum; Depression affecting pregnancy; History of prior pregnancy with short cervix, currently pregnant; and Unwanted fertility on their problem list. ? ?Patient reports backache and heartburn.  Contractions: Irritability. Vag. Bleeding: None.  Movement: Present. Denies leaking of fluid.  ? ?The following portions of the patient's history were reviewed and updated as appropriate: allergies, current medications, past family history, past medical history, past social history, past surgical history and problem list. Problem list updated. ? ?Objective:  ?There were no vitals filed for this visit. ? ?Fetal Status:     Movement: Present    ? ?General:  Alert, oriented and cooperative. Patient is in no acute distress.  ?Skin: Skin is warm and dry. No rash noted.   ?Cardiovascular: Normal heart rate noted  ?Respiratory: Normal respiratory effort, no problems with respiration noted  ?Abdomen: Soft, gravid, appropriate for gestational age. Pain/Pressure: Present     ?Pelvic:  Cervical exam deferred        ?Extremities: Normal range of motion.  Edema: None  ?Mental Status: Normal mood and affect. Normal behavior. Normal judgment and thought content.  ? ?Urinalysis:     ? ?Assessment and Plan:  ?Pregnancy: [id number] at [redacted]w[redacted]d ? ?1. Supervision of other normal pregnancy, antepartum ? ? ?Preterm labor symptoms and general obstetric precautions including but not limited to vaginal bleeding, contractions, leaking of fluid and fetal movement were reviewed in detail with the patient. ?Please refer to After Visit Summary for other counseling recommendations. ?  ?Return in about 2 weeks (around 03/18/2022) for [place]. ? ? ?Shelly Bombard, MD  ?03/04/22  ?

## 2022-03-08 ENCOUNTER — Inpatient Hospital Stay (HOSPITAL_COMMUNITY)
Admission: AD | Admit: 2022-03-08 | Discharge: 2022-03-08 | Disposition: A | Discharge status: Home / Self Care | Payer: Medicaid Other | Attending: Obstetrics and Gynecology | Admitting: Obstetrics and Gynecology

## 2022-03-08 ENCOUNTER — Encounter (HOSPITAL_COMMUNITY): Payer: Self-pay | Admitting: Obstetrics and Gynecology

## 2022-03-08 ENCOUNTER — Other Ambulatory Visit: Payer: Self-pay

## 2022-03-08 DIAGNOSIS — O4703 False labor before 37 completed weeks of gestation, third trimester: Secondary | ICD-10-CM | POA: Diagnosis not present

## 2022-03-08 DIAGNOSIS — Z3A33 33 weeks gestation of pregnancy: Secondary | ICD-10-CM | POA: Insufficient documentation

## 2022-03-08 LAB — URINALYSIS, ROUTINE W REFLEX MICROSCOPIC
Bilirubin Urine: NEGATIVE
Glucose, UA: 50 mg/dL — AB
Hgb urine dipstick: NEGATIVE
Ketones, ur: 5 mg/dL — AB
Nitrite: NEGATIVE
Protein, ur: 30 mg/dL — AB
Specific Gravity, Urine: 1.028 (ref 1.005–1.030)
pH: 5 (ref 5.0–8.0)

## 2022-03-08 MED ORDER — NIFEDIPINE 10 MG PO CAPS
10.0000 mg | ORAL_CAPSULE | ORAL | Status: DC | PRN
  Administered 2022-03-08 (×2): 10 mg via ORAL
  Filled 2022-03-08 (×2): qty 1

## 2022-03-08 MED ORDER — LACTATED RINGERS IV BOLUS
1000.0000 mL | Freq: Once | INTRAVENOUS | Status: AC
  Administered 2022-03-08: 1000 mL via INTRAVENOUS

## 2022-03-08 MED ORDER — NIFEDIPINE 10 MG PO CAPS
10.0000 mg | ORAL_CAPSULE | Freq: Three times a day (TID) | ORAL | 0 refills | Status: DC | PRN
Start: 2022-03-08 — End: 2022-04-14

## 2022-03-08 NOTE — Discharge Instructions (Signed)

## 2022-03-08 NOTE — MAU Provider Note (Signed)
?History  ?  ? ?CSN: [id number] ? ?Arrival date and time: 03/08/22 2000 ? ? Event Date/Time  ? First Provider Initiated Contact with Patient 03/08/22 2056   ?  ? ?Chief Complaint  ?Patient presents with  ? Contractions  ? ?HPI ?Terry [name] is a 26 y.o. [id number] at [redacted]w[redacted]d who presents with pelvic pain. She reports she is having intermittent lower abdominal pain and pelvic pain. She reports the cramping radiates down her leg. She denies any bleeding or leaking. She reports normal fetal movement. She also reports she passed a "glob of mucous" at home but none since.  ? ?OB History   ? ? Gravida  ?5  ? Para  ?2  ? Term  ?2  ? Preterm  ?   ? AB  ?2  ? Living  ?2  ?  ? ? SAB  ?1  ? IAB  ?1  ? Ectopic  ?   ? Multiple  ?0  ? Live Births  ?2  ?   ?  ?  ? ? ?Past Medical History:  ?Diagnosis Date  ? Chlamydia   ? Depression   ? Gonorrhea   ? Pregnancy induced hypertension   ? ? ?Past Surgical History:  ?Procedure Laterality Date  ? NO PAST SURGERIES    ? ? ?Family History  ?Problem Relation Age of Onset  ? Cancer Maternal Grandmother   ?     pancreatic  ? Diabetes Maternal Uncle   ? Diabetes Mother   ? Hypertension Mother   ? Diabetes Father   ? ? ?Social History  ? ?Tobacco Use  ? Smoking status: Never  ? Smokeless tobacco: Never  ?Vaping Use  ? Vaping Use: Never used  ?Substance Use Topics  ? Alcohol use: No  ? Drug use: No  ? ? ?Allergies: No Known Allergies ? ?No medications prior to admission.  ? ? ?Review of Systems  ?Constitutional: Negative.  Negative for fatigue and fever.  ?HENT: Negative.    ?Respiratory: Negative.  Negative for shortness of breath.   ?Cardiovascular: Negative.  Negative for chest pain.  ?Gastrointestinal:  Positive for abdominal pain. Negative for constipation, diarrhea, nausea and vomiting.  ?Genitourinary:  Negative for dysuria, vaginal bleeding and vaginal discharge.  ?Neurological: Negative.  Negative for dizziness and headaches.  ?Physical Exam  ? ?Blood pressure (!) 110/58, pulse (!) 105,  temperature 98.1 ?F (36.7 ?C), temperature source Oral, resp. rate 20, height 5\' 11"  (1.803 m), weight 89.1 kg, last menstrual period 07/23/2021, SpO2 98 %, unknown if currently breastfeeding. ? ?Physical Exam ?Vitals and nursing note reviewed.  ?Constitutional:   ?   General: She is not in acute distress. ?   Appearance: She is well-developed.  ?HENT:  ?   Head: Normocephalic.  ?Eyes:  ?   Pupils: Pupils are equal, round, and reactive to light.  ?Cardiovascular:  ?   Rate and Rhythm: Normal rate and regular rhythm.  ?   Heart sounds: Normal heart sounds.  ?Pulmonary:  ?   Effort: Pulmonary effort is normal. No respiratory distress.  ?   Breath sounds: Normal breath sounds.  ?Abdominal:  ?   General: Bowel sounds are normal. There is no distension.  ?   Palpations: Abdomen is soft.  ?   Tenderness: There is no abdominal tenderness.  ?Skin: ?   General: Skin is warm and dry.  ?Neurological:  ?   Mental Status: She is alert and oriented to person, place, and time.  ?Psychiatric:     ?  Mood and Affect: Mood normal.     ?   Behavior: Behavior normal.     ?   Thought Content: Thought content normal.     ?   Judgment: Judgment normal.  ? ?Fetal Tracing: ? ?Baseline: 130 ?Variability: moderate ?Accels: 15x15 ?Decels: none ? ?Toco: UI with occasional uc's ? ?Dilation: Closed ?Cervical Position: Posterior ?Exam by:: C. Zakai Gonyea,CNM ? ? ?MAU Course  ?Procedures ?Results for orders placed or performed during the hospital encounter of 03/08/22 (from the past 24 hour(s))  ?Urinalysis, Routine w reflex microscopic     Status: Abnormal  ? Collection Time: 03/08/22  8:30 PM  ?Result Value Ref Range  ? Color, Urine AMBER (A) YELLOW  ? APPearance HAZY (A) CLEAR  ? Specific Gravity, Urine 1.028 1.005 - 1.030  ? pH 5.0 5.0 - 8.0  ? Glucose, UA 50 (A) NEGATIVE mg/dL  ? Hgb urine dipstick NEGATIVE NEGATIVE  ? Bilirubin Urine NEGATIVE NEGATIVE  ? Ketones, ur 5 (A) NEGATIVE mg/dL  ? Protein, ur 30 (A) NEGATIVE mg/dL  ? Nitrite NEGATIVE  NEGATIVE  ? Leukocytes,Ua TRACE (A) NEGATIVE  ? RBC / HPF 0-5 0 - 5 RBC/hpf  ? WBC, UA 0-5 0 - 5 WBC/hpf  ? Bacteria, UA RARE (A) NONE SEEN  ? Squamous Epithelial / LPF 6-10 0 - 5  ? Mucus PRESENT   ?  ?MDM ?UA, UC ?LR bolus ?Patient reports some improvement after fluids but still feeling contractions.  ?Procardia x2 doses- patient reports resolution of pain ? ?Assessment and Plan  ? ?1. Preterm uterine contractions in third trimester, antepartum   ?2. [redacted] weeks gestation of pregnancy   ? ?-Discharge home in stable condition ?-Rx for procardia sent to patient's pharmacy ?-Preterm labor precautions discussed ?-Patient advised to follow-up with OB as scheduled for prenatal care ?-Patient may return to MAU as needed or if her condition were to change or worsen ? ? ?Wende Mott CNM ?03/08/2022, 11:42 PM  ?

## 2022-03-08 NOTE — MAU Note (Signed)
Pt says on Sat had vag spotting when she wiped x1  ?Last sex- March ?Was seen here 2 weeks ago - for pelvic/groin area  pain ?Same pain is back - took Tramadol at 12 noon - didn't help - ?At 10 am- had shower- golf ball size slime came out of her vag  ?[facility]  ?

## 2022-03-10 LAB — CULTURE, OB URINE: Culture: <10000 — AB

## 2022-03-17 ENCOUNTER — Encounter: Payer: Medicaid Other | Admitting: Obstetrics & Gynecology

## 2022-03-18 ENCOUNTER — Encounter: Payer: Self-pay | Admitting: Obstetrics

## 2022-03-19 ENCOUNTER — Telehealth: Payer: Self-pay

## 2022-03-19 NOTE — Telephone Encounter (Signed)
Returned call to see if patient's symptoms improved or if she was evaluated at [facility]. Pt states that bleeding has slowed down but she is still having painful cramping, advised to be evaluated for preterm labor, pt agreed. ?

## 2022-03-23 ENCOUNTER — Ambulatory Visit (INDEPENDENT_AMBULATORY_CARE_PROVIDER_SITE_OTHER): Payer: Medicaid Other | Admitting: Obstetrics & Gynecology

## 2022-03-23 VITALS — BP 111/71 | HR 80 | Wt 197.0 lb

## 2022-03-23 DIAGNOSIS — Z3A35 35 weeks gestation of pregnancy: Secondary | ICD-10-CM

## 2022-03-23 DIAGNOSIS — O09299 Supervision of pregnancy with other poor reproductive or obstetric history, unspecified trimester: Secondary | ICD-10-CM

## 2022-03-23 DIAGNOSIS — Z3480 Encounter for supervision of other normal pregnancy, unspecified trimester: Secondary | ICD-10-CM

## 2022-03-23 NOTE — Progress Notes (Signed)
   PRENATAL VISIT NOTE  Subjective:  Terry [name] is a 26 y.o. [id number] at [redacted]w[redacted]d being seen today for ongoing prenatal care.  She is currently monitored for the following issues for this low-risk pregnancy and has Supervision of other normal pregnancy, antepartum; Depression affecting pregnancy; History of prior pregnancy with short cervix, currently pregnant; and Unwanted fertility on their problem list.  Patient reports occasional contractions.  Contractions: Irregular. Vag. Bleeding: None.  Movement: Present. Denies leaking of fluid.   The following portions of the patient's history were reviewed and updated as appropriate: allergies, current medications, past family history, past medical history, past social history, past surgical history and problem list.   Objective:   Vitals:   03/23/22 1329  BP: 111/71  Pulse: 80  Weight: 197 lb (89.4 kg)    Fetal Status: Fetal Heart Rate (bpm): 159   Movement: Present     General:  Alert, oriented and cooperative. Patient is in no acute distress.  Skin: Skin is warm and dry. No rash noted.   Cardiovascular: Normal heart rate noted  Respiratory: Normal respiratory effort, no problems with respiration noted  Abdomen: Soft, gravid, appropriate for gestational age.  Pain/Pressure: Present     Pelvic: Cervical exam deferred        Extremities: Normal range of motion.  Edema: None  Mental Status: Normal mood and affect. Normal behavior. Normal judgment and thought content.   Assessment and Plan:  Pregnancy: [id number] at [redacted]w[redacted]d 1. Supervision of other normal pregnancy, antepartum [redacted]w[redacted]d   2. History of prior pregnancy with short cervix, currently pregnant Nl BP  Preterm labor symptoms and general obstetric precautions including but not limited to vaginal bleeding, contractions, leaking of fluid and fetal movement were reviewed in detail with the patient. Please refer to After Visit Summary for other counseling recommendations.   Return in  about 1 week (around 03/30/2022).  Future Appointments  Date Time Provider [place]  03/30/2022  1:50 PM Shelly Bombard, MD [facility] None    Emeterio Reeve, MD

## 2022-03-23 NOTE — Progress Notes (Signed)
ROB, c/o irregular contractions ?

## 2022-03-29 ENCOUNTER — Encounter: Payer: Self-pay | Admitting: Obstetrics

## 2022-03-30 ENCOUNTER — Ambulatory Visit (INDEPENDENT_AMBULATORY_CARE_PROVIDER_SITE_OTHER): Payer: Medicaid Other | Admitting: Obstetrics

## 2022-03-30 ENCOUNTER — Other Ambulatory Visit (HOSPITAL_COMMUNITY)
Admission: RE | Admit: 2022-03-30 | Discharge: 2022-03-30 | Disposition: A | Discharge status: Home / Self Care | Payer: Medicaid Other | Source: Ambulatory Visit | Attending: Obstetrics | Admitting: Obstetrics

## 2022-03-30 ENCOUNTER — Encounter: Payer: Self-pay | Admitting: Obstetrics

## 2022-03-30 VITALS — BP 132/75 | HR 85 | Wt 197.5 lb

## 2022-03-30 DIAGNOSIS — Z3483 Encounter for supervision of other normal pregnancy, third trimester: Secondary | ICD-10-CM

## 2022-03-30 DIAGNOSIS — Z3480 Encounter for supervision of other normal pregnancy, unspecified trimester: Secondary | ICD-10-CM

## 2022-03-30 DIAGNOSIS — O09293 Supervision of pregnancy with other poor reproductive or obstetric history, third trimester: Secondary | ICD-10-CM

## 2022-03-30 DIAGNOSIS — O09299 Supervision of pregnancy with other poor reproductive or obstetric history, unspecified trimester: Secondary | ICD-10-CM

## 2022-03-30 DIAGNOSIS — Z3A36 36 weeks gestation of pregnancy: Secondary | ICD-10-CM

## 2022-03-30 DIAGNOSIS — O4703 False labor before 37 completed weeks of gestation, third trimester: Secondary | ICD-10-CM

## 2022-03-30 NOTE — Progress Notes (Signed)
Subjective:  Terry [name] is a 26 y.o. [id number] at [redacted]w[redacted]d being seen today for ongoing prenatal care.  She is currently monitored for the following issues for this low-risk pregnancy and has Supervision of other normal pregnancy, antepartum; Depression affecting pregnancy; History of prior pregnancy with short cervix, currently pregnant; and Unwanted fertility on their problem list.  Patient reports no complaints.  Contractions: Not present. Vag. Bleeding: None.  Movement: Present. Denies leaking of fluid.   The following portions of the patient's history were reviewed and updated as appropriate: allergies, current medications, past family history, past medical history, past social history, past surgical history and problem list. Problem list updated.  Objective:   Vitals:   03/30/22 1348  BP: 132/75  Pulse: 85  Weight: 197 lb 8 oz (89.6 kg)    Fetal Status: Fetal Heart Rate (bpm): 156   Movement: Present     General:  Alert, oriented and cooperative. Patient is in no acute distress.  Skin: Skin is warm and dry. No rash noted.   Cardiovascular: Normal heart rate noted  Respiratory: Normal respiratory effort, no problems with respiration noted  Abdomen: Soft, gravid, appropriate for gestational age. Pain/Pressure: Present     Pelvic:  Cervical exam deferred        Extremities: Normal range of motion.  Edema: None  Mental Status: Normal mood and affect. Normal behavior. Normal judgment and thought content.   Urinalysis:      Assessment and Plan:  Pregnancy: [id number] at [redacted]w[redacted]d  1. Supervision of other normal pregnancy, antepartum Rx: - Culture, beta strep (group b only) - Cervicovaginal ancillary only( [place])  2. History of prior pregnancy with short cervix, currently pregnant - clinically stable  3. Preterm uterine contractions in third trimester, antepartum   Preterm labor symptoms and general obstetric precautions including but not limited to vaginal bleeding,  contractions, leaking of fluid and fetal movement were reviewed in detail with the patient. Please refer to After Visit Summary for other counseling recommendations.   Return in about 1 week (around 04/06/2022) for ROB.   Brock Bad, MD  03/30/22

## 2022-03-30 NOTE — Progress Notes (Signed)
Pt reports fetal movement today. 

## 2022-04-01 LAB — CERVICOVAGINAL ANCILLARY ONLY
Bacterial Vaginitis (gardnerella): NEGATIVE
Candida Glabrata: NEGATIVE
Candida Vaginitis: NEGATIVE
Chlamydia: NEGATIVE
Comment: NEGATIVE
Comment: NEGATIVE
Comment: NEGATIVE
Comment: NEGATIVE
Comment: NEGATIVE
Comment: NORMAL
Neisseria Gonorrhea: NEGATIVE
Trichomonas: NEGATIVE

## 2022-04-03 LAB — CULTURE, BETA STREP (GROUP B ONLY): Strep Gp B Culture: NEGATIVE

## 2022-04-06 ENCOUNTER — Ambulatory Visit (INDEPENDENT_AMBULATORY_CARE_PROVIDER_SITE_OTHER): Payer: Medicaid Other | Admitting: Obstetrics & Gynecology

## 2022-04-06 ENCOUNTER — Encounter: Payer: Self-pay | Admitting: Obstetrics & Gynecology

## 2022-04-06 VITALS — BP 113/78 | HR 94 | Wt 196.4 lb

## 2022-04-06 DIAGNOSIS — Z3480 Encounter for supervision of other normal pregnancy, unspecified trimester: Secondary | ICD-10-CM

## 2022-04-06 DIAGNOSIS — Z3A37 37 weeks gestation of pregnancy: Secondary | ICD-10-CM

## 2022-04-06 DIAGNOSIS — O09299 Supervision of pregnancy with other poor reproductive or obstetric history, unspecified trimester: Secondary | ICD-10-CM

## 2022-04-06 DIAGNOSIS — O09293 Supervision of pregnancy with other poor reproductive or obstetric history, third trimester: Secondary | ICD-10-CM

## 2022-04-06 NOTE — Progress Notes (Signed)
   PRENATAL VISIT NOTE  Subjective:  Terry [name] is a 26 y.o. [id number] at [redacted]w[redacted]d being seen today for ongoing prenatal care.  She is currently monitored for the following issues for this low-risk pregnancy and has Supervision of other normal pregnancy, antepartum; Depression affecting pregnancy; History of prior pregnancy with short cervix, currently pregnant; and Unwanted fertility on their problem list.  Patient reports backache and occasional contractions.  Contractions: Not present. Vag. Bleeding: None.  Movement: Present. Denies leaking of fluid.   The following portions of the patient's history were reviewed and updated as appropriate: allergies, current medications, past family history, past medical history, past social history, past surgical history and problem list.   Objective:   Vitals:   04/06/22 0837  BP: 113/78  Pulse: 94  Weight: 196 lb 6.4 oz (89.1 kg)    Fetal Status: Fetal Heart Rate (bpm): 143   Movement: Present  Presentation: Vertex  General:  Alert, oriented and cooperative. Patient is in no acute distress.  Skin: Skin is warm and dry. No rash noted.   Cardiovascular: Normal heart rate noted  Respiratory: Normal respiratory effort, no problems with respiration noted  Abdomen: Soft, gravid, appropriate for gestational age.  Pain/Pressure: Present     Pelvic: Cervical exam performed in the presence of a chaperone Dilation: 2.5 Effacement (%): 50 Station: -3  Extremities: Normal range of motion.  Edema: None  Mental Status: Normal mood and affect. Normal behavior. Normal judgment and thought content.   Assessment and Plan:  Pregnancy: [id number] at [redacted]w[redacted]d 1. Supervision of other normal pregnancy, antepartum GBS neg  2. History of prior pregnancy with short cervix, currently pregnant Term  Term labor symptoms and general obstetric precautions including but not limited to vaginal bleeding, contractions, leaking of fluid and fetal movement were reviewed in detail  with the patient. Please refer to After Visit Summary for other counseling recommendations.  IOL elective 39 weeks Return in about 1 week (around 04/13/2022).  No future appointments.  Scheryl Darter, MD

## 2022-04-06 NOTE — Progress Notes (Signed)
Pt presents for ROB visit. Pt c/o of lower back pain, abdominal and vaginal pain and pressure. Pt unsure if she is having contractions. Pt was seem at MAU on 5/1 and states pain medications that were given are not helping.

## 2022-04-07 ENCOUNTER — Encounter: Payer: Medicaid Other | Admitting: Obstetrics & Gynecology

## 2022-04-08 ENCOUNTER — Encounter (HOSPITAL_COMMUNITY): Payer: Self-pay | Admitting: Obstetrics & Gynecology

## 2022-04-08 ENCOUNTER — Other Ambulatory Visit: Payer: Self-pay

## 2022-04-08 ENCOUNTER — Inpatient Hospital Stay (HOSPITAL_COMMUNITY)
Admission: AD | Admit: 2022-04-08 | Discharge: 2022-04-08 | Disposition: A | Discharge status: Home / Self Care | Payer: Medicaid Other | Attending: Obstetrics & Gynecology | Admitting: Obstetrics & Gynecology

## 2022-04-08 ENCOUNTER — Ambulatory Visit: Payer: Self-pay

## 2022-04-08 ENCOUNTER — Telehealth: Payer: Self-pay | Admitting: *Deleted

## 2022-04-08 DIAGNOSIS — M549 Dorsalgia, unspecified: Secondary | ICD-10-CM | POA: Insufficient documentation

## 2022-04-08 DIAGNOSIS — R109 Unspecified abdominal pain: Secondary | ICD-10-CM | POA: Diagnosis present

## 2022-04-08 DIAGNOSIS — Z0371 Encounter for suspected problem with amniotic cavity and membrane ruled out: Secondary | ICD-10-CM | POA: Diagnosis not present

## 2022-04-08 DIAGNOSIS — O26893 Other specified pregnancy related conditions, third trimester: Secondary | ICD-10-CM | POA: Insufficient documentation

## 2022-04-08 DIAGNOSIS — Z3A37 37 weeks gestation of pregnancy: Secondary | ICD-10-CM

## 2022-04-08 LAB — POCT FERN TEST: POCT Fern Test: NEGATIVE

## 2022-04-08 NOTE — MAU Note (Signed)
Terry [name] is a 26 y.o. at [redacted]w[redacted]d here in MAU reporting: she noticed her underwear were wet after being seen in office yesterday.  Reports has continued to have LOF today and has had to change underwear.  Also states noticed glob of blood tinged mucus yesterday after office appt.  States cervix was checked yesterday and was 3cms dilated.  Also reporting abdominal cramping and constant back pain.  Endorses +FM.  Onset of complaint: yesterday Pain score: 5/10 back & 3/10 cramping Vitals:   04/08/22 1427  BP: 105/70  Pulse: (!) 104  Resp: 19  Temp: 98.7 F (37.1 C)  SpO2: 99%     FHT:152 bpm Lab orders placed from triage:   UA

## 2022-04-08 NOTE — MAU Provider Note (Signed)
S: [name] is a 26 y.o. [id number] at [redacted]w[redacted]d  who presents to MAU today complaining of her underwear being wet after being seen in the office yesterday. She has continued to leak fluid today; she's had to continue to change her underwear. She endorses seeing "a glob of blood tinged mucous" yesterday. She was 3 cm in the office yesterday. She denies vaginal bleeding. She endorses abdominal cramping and constant back pain. She reports normal fetal movement.    O: BP 121/76   Pulse (!) 107   Temp 98.7 F (37.1 C) (Oral)   Resp 19   Ht 5\' 10"  (1.778 m)   Wt 89 kg   LMP 07/23/2021 (Exact Date)   SpO2 99%   BMI 28.17 kg/m  GENERAL: Well-developed, well-nourished female in no acute distress.  HEAD: Normocephalic, atraumatic.  CHEST: Normal effort of breathing, regular heart rate ABDOMEN: Soft, nontender, gravid PELVIC: Normal external female genitalia. Vagina is pink and rugated. Cervix with normal contour, no lesions. Mucoid discharge c/w mucous plug.  No pooling.   Cervical exam:  Dilation: 3 Effacement (%): 70 Cervical Position: Posterior Station: -2 Presentation: Vertex Exam by:: R.Edu On,CNM   Fetal Monitoring: Baseline: 145 Variability: moderate Accelerations: present Decelerations: absent Contractions: irregular every 4-10 minutes  Results for orders placed or performed during the hospital encounter of 04/08/22 (from the past 24 hour(s))  POCT fern test     Status: Normal   Collection Time: 04/08/22  3:19 PM  Result Value Ref Range   POCT Fern Test Negative = intact amniotic membranes      A: SIUP at [redacted]w[redacted]d  Membranes intact No leakage of amniotic fluid into vagina  [redacted] weeks gestation of pregnancy    P: Discharge patient Reassurance given that her membranes are not broken Keep scheduled appt with [facility] on 04/14/2022  06/14/2022, CNM 04/08/2022, 3:05 PM

## 2022-04-08 NOTE — Telephone Encounter (Signed)
   Chief Complaint: Pt. States yesterday was "leaking fluid and had a bloody mucus discharge." "I called the after hours number and they told me to go to the ED, but I didn't want to waste my time." Today has greenish, bloody discharge, rectal and back pressure. Symptoms: Above Frequency: Yesterday Pertinent Negatives: Patient denies  Disposition: [x] ED /[] Urgent Care (no appt availability in office) / [] Appointment(In office/virtual)/ []  Agency Virtual Care/ [] Home Care/ [] Refused Recommended Disposition /[] [facility]/ []  Follow-up with PCP Additional Notes: Instructed to have someone drive her.  Reason for Disposition  Patient sounds very sick or weak to the triager  Answer Assessment - Initial Assessment Questions 1. DISCHARGE: "Describe the discharge." (e.g., white, yellow, green, gray, foamy, cottage cheese-like)     Green, bloody 2. ODOR: "Is there a bad odor?"     No 3. ONSET: "When did the discharge begin?"     Yesterday 4. RASH: "Is there a rash in that area?" If Yes, ask: "Describe it." (e.g., redness, blisters, sores, bumps)     No 5. ABDOMINAL PAIN: "Are you having any abdominal pain?" If Yes, ask: "What does it feel like?" (e.g., crampy, dull, intermittent, constant)      Cramping 6. ABDOMINAL PAIN SEVERITY: If present, ask: "How bad is it?"  (e.g., Scale 1-10; mild, moderate, or severe)   - MILD (1-3): doesn't interfere with normal activities, abdomen soft and not tender to touch    - MODERATE (4-7): interferes with normal activities or awakens from sleep, abdomen tender to touch    - SEVERE (8-10): excruciating pain, doubled over, unable to do any normal activities     3 7. CAUSE: "What do you think is causing the discharge?"     Maybe labor 8. OTHER SYMPTOMS: "Do you have any other symptoms?" (e.g., fever, itching, vaginal bleeding, pain with urination)     No 9. EDD: "What date are you expecting to deliver?"      04/22/22 10. PREGNANCY: "How many weeks  pregnant are you?"       38 weeks  Protocols used: Pregnancy - Vaginal Discharge-A-AH

## 2022-04-08 NOTE — Telephone Encounter (Signed)
TC from pt. Concerned that she was discharged from MAU at 4cm/ 70% effaced. Reassured pt that provider had discharged her because she was not in active labor and FHR was reassuring and there was no evidence of SROM. Encouraged pt to stay active and wait for labor to start. Labor and SROM precautions reviewed. Pt verbalized understanding.

## 2022-04-14 ENCOUNTER — Encounter: Payer: Self-pay | Admitting: Obstetrics and Gynecology

## 2022-04-14 ENCOUNTER — Other Ambulatory Visit: Payer: Self-pay | Admitting: Advanced Practice Midwife

## 2022-04-14 ENCOUNTER — Ambulatory Visit (INDEPENDENT_AMBULATORY_CARE_PROVIDER_SITE_OTHER): Payer: Medicaid Other | Admitting: Obstetrics and Gynecology

## 2022-04-14 VITALS — BP 119/72 | HR 106 | Wt 198.4 lb

## 2022-04-14 DIAGNOSIS — Z3480 Encounter for supervision of other normal pregnancy, unspecified trimester: Secondary | ICD-10-CM

## 2022-04-14 NOTE — Patient Instructions (Signed)
Vaginal Delivery  Vaginal delivery means that you give birth by pushing your baby out of your birth canal (vagina). Your health care team will help you before, during, and after vaginal delivery. Birth experiences are unique for every woman and every pregnancy, and birth experiences vary depending on where you choose to give birth. What are the risks and benefits? Generally, this is safe. However, problems may occur, including: Bleeding. Infection. Damage to other structures such as vaginal tearing. Allergic reactions to medicines. Despite the risks, benefits of vaginal delivery include less risk of bleeding and infection and a shorter recovery time compared to a Cesarean delivery. Cesarean delivery, or C-section, is the surgical delivery of a baby. What happens when I arrive at the birth center or hospital? Once you are in labor and have been admitted into the hospital or birth center, your health care team may: Review your pregnancy history and any concerns that you have. Talk with you about your birth plan and discuss pain control options. Check your blood pressure, breathing, and heartbeat. Assess your baby's heartbeat. Monitor your uterus for contractions. Check whether your bag of water (amniotic sac) has broken (ruptured). Insert an IV into one of your veins. This may be used to give you fluids and medicines. Monitoring Your health care team may assess your contractions (uterine monitoring) and your baby's heart rate (fetal monitoring). You may need to be monitored: Often, but not continuously (intermittently). All the time or for long periods at a time (continuously). Continuous monitoring may be needed if: You are taking certain medicines, such as medicine to relieve pain or make your contractions stronger. You have pregnancy or labor complications. Monitoring may be done by: Placing a special stethoscope or a handheld monitoring device on your abdomen to check your baby's  heartbeat and to check for contractions. Placing monitors on your abdomen (external monitors) to record your baby's heartbeat and the frequency and length of contractions. Placing monitors inside your uterus through your vagina (internal monitors) to record your baby's heartbeat and the frequency, length, and strength of your contractions. Depending on the type of monitor, it may remain in your uterus or on your baby's head until birth. Telemetry. This is a type of continuous monitoring that can be done with external or internal monitors. Instead of having to stay in bed, you are able to move around. Physical exam Your health care team may perform frequent physical exams. This may include: Checking how and where your baby is positioned in your uterus. Checking your cervix to determine: Whether it is thinning out (effacing). Whether it is opening up (dilating). What happens during labor and delivery?  Normal labor and delivery is divided into the following three stages: Stage 1 This is the longest stage of labor. Throughout this stage, you will feel contractions. Contractions generally feel mild, infrequent, and irregular at first. They get stronger, more frequent, and more regular as you move through this stage. You may have contractions about every 2-3 minutes. This stage ends when your cervix is completely dilated to 4 inches (10 cm) and completely effaced. Stage 2 This stage starts once your cervix is completely effaced and dilated and lasts until the delivery of your baby. This is the stage where you will feel an urge to push your baby out of your vagina. You may feel stretching and burning pain, especially when the widest part of your baby's head passes through the vaginal opening (crowning). Once your baby is delivered, the umbilical cord will be   clamped and cut. Timing of cutting the cord will depend on your wishes, your baby's health, and your health care provider's practices. Your baby  will be placed on your bare chest (skin-to-skin contact) in an upright position and covered with a warm blanket. If you are choosing to breastfeed, watch your baby for feeding cues, like rooting or sucking, and help the baby to your breast for his or her first feeding. Stage 3 This stage starts immediately after the birth of your baby and ends after you deliver the placenta. This stage may take anywhere from 5 to 30 minutes. After your baby has been delivered, you will feel contractions as your body expels the placenta. These contractions also help your uterus get smaller and reduce bleeding. What can I expect after labor and delivery? After labor is over, you and your baby will be assessed closely until you are ready to go home. Your health care team will teach you how to care for yourself and your baby. You and your baby may be encouraged to stay in the same room (rooming in) during your hospital stay. This will help promote early bonding and successful breastfeeding. Your uterus will be checked and massaged regularly (fundal massage). You may continue to receive fluids and medicines through an IV. You will have some soreness and pain in your abdomen, vagina, and the area of skin between your vaginal opening and your anus (perineum). If an incision was made near your vagina (episiotomy) or if you had some vaginal tearing during delivery, cold compresses may be placed on your episiotomy or your tear. This helps to reduce pain and swelling. It is normal to have vaginal bleeding after delivery. Wear a sanitary pad for vaginal bleeding and discharge. Summary Vaginal delivery means that you will give birth by pushing your baby out of your birth canal (vagina). Your health care team will monitor you and your baby throughout the stages of labor. After you deliver your baby, your health care team will continue to assess you and your baby to ensure you are both recovering as expected after delivery. This  information is not intended to replace advice given to you by your health care provider. Make sure you discuss any questions you have with your health care provider. Document Revised: 09/22/2020 Document Reviewed: 09/22/2020 [employer]vier Patient Education  2023 [employer]  

## 2022-04-14 NOTE — Progress Notes (Signed)
Subjective:  Terry [name] is a 26 y.o. [id number] at [redacted]w[redacted]d being seen today for ongoing prenatal care.  She is currently monitored for the following issues for this low-risk pregnancy and has Supervision of other normal pregnancy, antepartum; Depression affecting pregnancy; and History of prior pregnancy with short cervix, currently pregnant on their problem list.  Patient reports general discomforts of pregnancy.  Contractions: Irritability. Vag. Bleeding: None.  Movement: Present. Denies leaking of fluid.   The following portions of the patient's history were reviewed and updated as appropriate: allergies, current medications, past family history, past medical history, past social history, past surgical history and problem list. Problem list updated.  Objective:   Vitals:   04/14/22 1401  BP: 119/72  Pulse: (!) 106  Weight: 198 lb 6.4 oz (90 kg)    Fetal Status:     Movement: Present     General:  Alert, oriented and cooperative. Patient is in no acute distress.  Skin: Skin is warm and dry. No rash noted.   Cardiovascular: Normal heart rate noted  Respiratory: Normal respiratory effort, no problems with respiration noted  Abdomen: Soft, gravid, appropriate for gestational age. Pain/Pressure: Present     Pelvic:  Cervical exam deferred        Extremities: Normal range of motion.  Edema: None  Mental Status: Normal mood and affect. Normal behavior. Normal judgment and thought content.   Urinalysis:      Assessment and Plan:  Pregnancy: [id number] at [redacted]w[redacted]d  1. Supervision of other normal pregnancy, antepartum Labor Precautions IOL scheudled  Term labor symptoms and general obstetric precautions including but not limited to vaginal bleeding, contractions, leaking of fluid and fetal movement were reviewed in detail with the patient. Please refer to After Visit Summary for other counseling recommendations.  Return in about 6 weeks (around 05/26/2022) for PP visit any  provider.   Hermina Staggers, MD

## 2022-04-15 ENCOUNTER — Telehealth (HOSPITAL_COMMUNITY): Payer: Self-pay | Admitting: *Deleted

## 2022-04-15 ENCOUNTER — Encounter (HOSPITAL_COMMUNITY): Payer: Self-pay | Admitting: *Deleted

## 2022-04-15 NOTE — Telephone Encounter (Signed)
Preadmission screen  

## 2022-04-17 ENCOUNTER — Other Ambulatory Visit: Payer: Self-pay | Admitting: Advanced Practice Midwife

## 2022-04-17 ENCOUNTER — Inpatient Hospital Stay (HOSPITAL_COMMUNITY)
Admission: AD | Admit: 2022-04-17 | Discharge: 2022-04-19 | DRG: 807 | Disposition: A | Discharge status: Home / Self Care | Payer: Medicaid Other | Attending: Obstetrics & Gynecology | Admitting: Obstetrics & Gynecology

## 2022-04-17 ENCOUNTER — Inpatient Hospital Stay (HOSPITAL_COMMUNITY): Payer: Medicaid Other | Admitting: Anesthesiology

## 2022-04-17 ENCOUNTER — Encounter (HOSPITAL_COMMUNITY): Payer: Self-pay | Admitting: Obstetrics & Gynecology

## 2022-04-17 ENCOUNTER — Inpatient Hospital Stay (HOSPITAL_COMMUNITY): Payer: Medicaid Other

## 2022-04-17 DIAGNOSIS — Z3A39 39 weeks gestation of pregnancy: Secondary | ICD-10-CM

## 2022-04-17 DIAGNOSIS — F32A Depression, unspecified: Secondary | ICD-10-CM | POA: Diagnosis present

## 2022-04-17 DIAGNOSIS — Z79899 Other long term (current) drug therapy: Secondary | ICD-10-CM

## 2022-04-17 DIAGNOSIS — O26893 Other specified pregnancy related conditions, third trimester: Secondary | ICD-10-CM | POA: Diagnosis present

## 2022-04-17 DIAGNOSIS — O99344 Other mental disorders complicating childbirth: Secondary | ICD-10-CM | POA: Diagnosis present

## 2022-04-17 DIAGNOSIS — O99340 Other mental disorders complicating pregnancy, unspecified trimester: Secondary | ICD-10-CM | POA: Diagnosis present

## 2022-04-17 DIAGNOSIS — O09299 Supervision of pregnancy with other poor reproductive or obstetric history, unspecified trimester: Secondary | ICD-10-CM

## 2022-04-17 DIAGNOSIS — Z3480 Encounter for supervision of other normal pregnancy, unspecified trimester: Principal | ICD-10-CM

## 2022-04-17 DIAGNOSIS — Z30430 Encounter for insertion of intrauterine contraceptive device: Secondary | ICD-10-CM

## 2022-04-17 LAB — TYPE AND SCREEN
ABO/RH(D): B POS
Antibody Screen: NEGATIVE

## 2022-04-17 LAB — CBC
HCT: 32 % — ABNORMAL LOW (ref 36.0–46.0)
Hemoglobin: 10.9 g/dL — ABNORMAL LOW (ref 12.0–15.0)
MCH: 32.1 pg (ref 26.0–34.0)
MCHC: 34.1 g/dL (ref 30.0–36.0)
MCV: 94.1 fL (ref 80.0–100.0)
Platelets: 225 10*3/uL (ref 150–400)
RBC: 3.4 MIL/uL — ABNORMAL LOW (ref 3.87–5.11)
RDW: 14 % (ref 11.5–15.5)
WBC: 11.8 10*3/uL — ABNORMAL HIGH (ref 4.0–10.5)
nRBC: 0 % (ref 0.0–0.2)

## 2022-04-17 MED ORDER — EPHEDRINE 5 MG/ML INJ
10.0000 mg | INTRAVENOUS | Status: DC | PRN
Start: 2022-04-17 — End: 2022-04-18

## 2022-04-17 MED ORDER — TERBUTALINE SULFATE 1 MG/ML IJ SOLN: 0.2500 mg | Freq: Once | INTRAMUSCULAR | Status: DC | PRN

## 2022-04-17 MED ORDER — SOD CITRATE-CITRIC ACID 500-334 MG/5ML PO SOLN: 30.0000 mL | ORAL | Status: DC | PRN

## 2022-04-17 MED ORDER — LACTATED RINGERS IV SOLN
500.0000 mL | INTRAVENOUS | Status: DC | PRN
  Administered 2022-04-18: 1000 mL via INTRAVENOUS

## 2022-04-17 MED ORDER — LEVONORGESTREL 20 MCG/DAY IU IUD
1.0000 | INTRAUTERINE_SYSTEM | Freq: Once | INTRAUTERINE | Status: AC
  Administered 2022-04-18: 1 via INTRAUTERINE
  Filled 2022-04-17: qty 1

## 2022-04-17 MED ORDER — ONDANSETRON HCL 4 MG/2ML IJ SOLN
4.0000 mg | Freq: Four times a day (QID) | INTRAMUSCULAR | Status: DC | PRN
Start: 2022-04-17 — End: 2022-04-18

## 2022-04-17 MED ORDER — LACTATED RINGERS IV SOLN: INTRAVENOUS | Status: DC

## 2022-04-17 MED ORDER — LIDOCAINE HCL (PF) 1 % IJ SOLN: 30.0000 mL | INTRAMUSCULAR | Status: DC | PRN

## 2022-04-17 MED ORDER — OXYTOCIN-SODIUM CHLORIDE 30-0.9 UT/500ML-% IV SOLN
2.5000 [IU]/h | INTRAVENOUS | Status: DC
  Administered 2022-04-18: 2.5 [IU]/h via INTRAVENOUS
  Filled 2022-04-17: qty 500

## 2022-04-17 MED ORDER — PHENYLEPHRINE 80 MCG/ML (10ML) SYRINGE FOR IV PUSH (FOR BLOOD PRESSURE SUPPORT): 80.0000 ug | PREFILLED_SYRINGE | INTRAVENOUS | Status: DC | PRN

## 2022-04-17 MED ORDER — EPHEDRINE 5 MG/ML INJ: 10.0000 mg | INTRAVENOUS | Status: DC | PRN

## 2022-04-17 MED ORDER — LIDOCAINE HCL (PF) 1 % IJ SOLN
INTRAMUSCULAR | Status: DC | PRN
  Administered 2022-04-17: 11 mL via EPIDURAL

## 2022-04-17 MED ORDER — DIPHENHYDRAMINE HCL 50 MG/ML IJ SOLN: 12.5000 mg | INTRAMUSCULAR | Status: DC | PRN

## 2022-04-17 MED ORDER — OXYCODONE-ACETAMINOPHEN 5-325 MG PO TABS: 1.0000 | ORAL_TABLET | ORAL | Status: DC | PRN

## 2022-04-17 MED ORDER — PHENYLEPHRINE 80 MCG/ML (10ML) SYRINGE FOR IV PUSH (FOR BLOOD PRESSURE SUPPORT)
80.0000 ug | PREFILLED_SYRINGE | INTRAVENOUS | Status: DC | PRN
Start: 2022-04-17 — End: 2022-04-18

## 2022-04-17 MED ORDER — FENTANYL-BUPIVACAINE-NACL 0.5-0.125-0.9 MG/250ML-% EP SOLN
12.0000 mL/h | EPIDURAL | Status: DC | PRN
  Administered 2022-04-17: 12 mL/h via EPIDURAL
  Filled 2022-04-17: qty 250

## 2022-04-17 MED ORDER — OXYCODONE-ACETAMINOPHEN 5-325 MG PO TABS: 2.0000 | ORAL_TABLET | ORAL | Status: DC | PRN

## 2022-04-17 MED ORDER — ACETAMINOPHEN 325 MG PO TABS
650.0000 mg | ORAL_TABLET | ORAL | Status: DC | PRN
Start: 2022-04-17 — End: 2022-04-18

## 2022-04-17 MED ORDER — LACTATED RINGERS IV SOLN
500.0000 mL | Freq: Once | INTRAVENOUS | Status: AC
  Administered 2022-04-17: 500 mL via INTRAVENOUS

## 2022-04-17 MED ORDER — OXYTOCIN BOLUS FROM INFUSION
333.0000 mL | Freq: Once | INTRAVENOUS | Status: AC
  Administered 2022-04-18: 333 mL via INTRAVENOUS

## 2022-04-17 MED ORDER — OXYTOCIN-SODIUM CHLORIDE 30-0.9 UT/500ML-% IV SOLN: 1.0000 m[IU]/min | INTRAVENOUS | Status: DC

## 2022-04-17 MED ORDER — MISOPROSTOL 50MCG HALF TABLET: 50.0000 ug | ORAL_TABLET | ORAL | Status: DC | PRN

## 2022-04-17 NOTE — Progress Notes (Signed)
Patient ID: Terry [name], female   DOB: 01/17/1996, 26 y.o.   MRN: [id number] AROM clear fluid  FHR reactive UCs irregular   Dilation: 5 Effacement (%): 80 Station: -3 Presentation: Vertex Exam by:: Hilda Lias  Anticipate increased labor

## 2022-04-17 NOTE — Anesthesia Procedure Notes (Signed)
Epidural Patient location during procedure: OB Start time: 04/17/2022 9:38 PM End time: 04/17/2022 9:56 PM  Staffing Anesthesiologist: Lowella Curb, MD Performed: anesthesiologist   Preanesthetic Checklist Completed: patient identified, IV checked, site marked, risks and benefits discussed, surgical consent, monitors and equipment checked, pre-op evaluation and timeout performed  Epidural Patient position: sitting Prep: ChloraPrep Patient monitoring: heart rate, cardiac monitor, continuous pulse ox and blood pressure Approach: midline Location: L2-L3 Injection technique: LOR saline  Needle:  Needle type: Tuohy  Needle gauge: 17 G Needle length: 9 cm Needle insertion depth: 6 cm Catheter type: closed end flexible Catheter size: 20 Guage Catheter at skin depth: 10 cm Test dose: negative  Assessment Events: blood not aspirated, injection not painful, no injection resistance, no paresthesia and negative IV test  Additional Notes Reason for block:procedure for pain

## 2022-04-17 NOTE — Anesthesia Preprocedure Evaluation (Signed)
Anesthesia Evaluation  Patient identified by MRN, date of birth, ID band Patient awake    Reviewed: Allergy & Precautions, Patient's Chart, lab work & pertinent test results  History of Anesthesia Complications Negative for: history of anesthetic complications  Airway Mallampati: II  TM Distance: >3 FB Neck ROM: Full    Dental  (+) Teeth Intact   Pulmonary neg pulmonary ROS,    breath sounds clear to auscultation       Cardiovascular hypertension, negative cardio ROS   Rhythm:Regular     Neuro/Psych negative neurological ROS  negative psych ROS   GI/Hepatic negative GI ROS, Neg liver ROS,   Endo/Other  negative endocrine ROS  Renal/GU negative Renal ROS     Musculoskeletal   Abdominal   Peds  Hematology negative hematology ROS (+)   Anesthesia Other Findings   Reproductive/Obstetrics (+) Pregnancy                             Anesthesia Physical  Anesthesia Plan  ASA: II  Anesthesia Plan: Epidural   Post-op Pain Management:    Induction:   PONV Risk Score and Plan:   Airway Management Planned:   Additional Equipment:   Intra-op Plan:   Post-operative Plan:   Informed Consent: I have reviewed the patients History and Physical, chart, labs and discussed the procedure including the risks, benefits and alternatives for the proposed anesthesia with the patient or authorized representative who has indicated his/her understanding and acceptance.       Plan Discussed with: Anesthesiologist  Anesthesia Plan Comments:         Anesthesia Quick Evaluation

## 2022-04-17 NOTE — H&P (Signed)
OBSTETRIC ADMISSION HISTORY AND PHYSICAL  Terry [name] is a 26 y.o. female [phone] with IUP at [redacted]w[redacted]d by 12 week [place] presenting for eIOL. She reports +FMs, no LOF, no VB, no blurry vision, headaches, peripheral edema, or RUQ pain.  She plans on breast and bottle feeding. She requests post-placental IUD for birth control postpartum.  She received her prenatal care at [facility].   Dating: By [place] --->  Estimated Date of Delivery: 04/23/22  Sono:   @[redacted]w[redacted]d , normal anatomy, breech presentation, anterior placental lie, 331 g, 21% EFW  Prenatal History/Complications:  History of prior pregnancy with short cervix  Depression (On Zoloft)  Past Medical History: Past Medical History:  Diagnosis Date   Chlamydia    Depression    Gonorrhea    Pregnancy induced hypertension     Past Surgical History: Past Surgical History:  Procedure Laterality Date   NO PAST SURGERIES      Obstetrical History: OB History     Gravida  5   Para  2   Term  2   Preterm      AB  2   Living  2      SAB  1   IAB  1   Ectopic      Multiple  0   Live Births  2           Social History Social History   Socioeconomic History   Marital status: Single    Spouse name: Not on file   Number of children: Not on file   Years of education: Not on file   Highest education level: Not on file  Occupational History   Not on file  Tobacco Use   Smoking status: Never   Smokeless tobacco: Never  Vaping Use   Vaping Use: Never used  Substance and Sexual Activity   Alcohol use: No   Drug use: No   Sexual activity: Not Currently    Comment: 1 partner X 2 years  Other Topics Concern   Not on file  Social History Narrative   Not on file   Social Determinants of Health   Financial Resource Strain: Not on file  Food Insecurity: Not on file  Transportation Needs: Not on file  Physical Activity: Not on file  Stress: Not on file  Social Connections: Not on file    Family  History: Family History  Problem Relation Age of Onset   Cancer Maternal Grandmother        pancreatic   Diabetes Maternal Uncle    Diabetes Mother    Hypertension Mother    Diabetes Father     Allergies: No Known Allergies  Medications Prior to Admission  Medication Sig Dispense Refill Last Dose   Prenatal Vit-Fe Phos-FA-Omega (VITAFOL GUMMIES) 3.33-0.333-34.8 MG CHEW Chew 1 tablet by mouth daily. 90 tablet 4    sertraline (ZOLOFT) 50 MG tablet Take 1 tablet (50 mg total) by mouth daily. 30 tablet 11    traMADol (ULTRAM) 50 MG tablet Take 1 tablet (50 mg total) by mouth every 6 (six) hours as needed. 15 tablet 0      Review of Systems  All systems reviewed and negative except as stated in HPI  Blood pressure 117/71, pulse 96, temperature (!) 97 F (36.1 C), temperature source Axillary, resp. rate 20, height 5\' 10"  (1.778 m), weight 73.5 kg, last menstrual period 07/23/2021, unknown if currently breastfeeding.  General appearance: alert, cooperative, and no distress Lungs: normal work of  breathing on room air  Heart: normal rate, warm and well perfused  Abdomen: soft, non-tender, gravid  Extremities: no LE edema or calf tenderness to palpation   Presentation: Cephalic  Fetal monitoring: Baseline 140 bpm, moderate variability, + accels, no decels Uterine activity: Irregular, every 1-5 minutes  Dilation: 5 Effacement (%): 70 Station: -2, -3 Exam by:: Dr. Mathis Fare  Prenatal labs: ABO, Rh: B/Positive/-- (12/08 1516) Antibody: Negative (12/08 1516) Rubella: 13.00 (12/08 1516) RPR: Non Reactive (03/29 1029)  HBsAg: Negative (12/08 1516)  HIV: Non Reactive (03/29 1029)  GBS: Negative/-- (05/23 1612)  2 hr Glucola normal  Genetic screening - LR NIPS, Horizon negative, AFP negative  Anatomy US normal   Prenatal Transfer Tool  Maternal Diabetes: No Genetic Screening: Normal Maternal Ultrasounds/Referrals: Normal Fetal Ultrasounds or other Referrals:  None Maternal  Substance Abuse:  No Significant Maternal Medications:  Zoloft  Significant Maternal Lab Results: Group B Strep negative  No results found for this or any previous visit (from the past 24 hour(s)).  Patient Active Problem List   Diagnosis Date Noted   History of prior pregnancy with short cervix, currently pregnant 02/25/2020   Depression affecting pregnancy 01/29/2020   Supervision of other normal pregnancy, antepartum 01/18/2020    Assessment/Plan:  Terry [name] is a 26 y.o. [id number] at [redacted]w[redacted]d here for eIOL.   #Labor: Favorable SVE. Contracting every 1-5 minutes. Will proceed with epidural placement and plan for AROM once placed.  #Pain: Epidural requested  #FWB: Cat 1  #ID:  GBS neg #MOF: Breast and bottle  #MOC: Post-placental IUD (Mirena). Reviewed risks and benefits. Consented at bedside. Ordered.   Worthy Rancher, MD  04/17/2022, 8:20 PM

## 2022-04-18 ENCOUNTER — Other Ambulatory Visit: Payer: Self-pay

## 2022-04-18 ENCOUNTER — Encounter (HOSPITAL_COMMUNITY): Payer: Self-pay | Admitting: Obstetrics & Gynecology

## 2022-04-18 DIAGNOSIS — Z3A39 39 weeks gestation of pregnancy: Secondary | ICD-10-CM

## 2022-04-18 LAB — RPR: RPR Ser Ql: NONREACTIVE

## 2022-04-18 MED ORDER — BUPIVACAINE HCL (PF) 0.25 % IJ SOLN
INTRAMUSCULAR | Status: DC | PRN
  Administered 2022-04-18: 10 mL via EPIDURAL

## 2022-04-18 MED ORDER — DIBUCAINE (PERIANAL) 1 % EX OINT: 1.0000 "application " | TOPICAL_OINTMENT | CUTANEOUS | Status: DC | PRN

## 2022-04-18 MED ORDER — ZOLPIDEM TARTRATE 5 MG PO TABS: 5.0000 mg | ORAL_TABLET | Freq: Every evening | ORAL | Status: DC | PRN

## 2022-04-18 MED ORDER — SIMETHICONE 80 MG PO CHEW: 80.0000 mg | CHEWABLE_TABLET | ORAL | Status: DC | PRN

## 2022-04-18 MED ORDER — IBUPROFEN 600 MG PO TABS
600.0000 mg | ORAL_TABLET | Freq: Four times a day (QID) | ORAL | Status: DC
  Administered 2022-04-18 – 2022-04-19 (×5): 600 mg via ORAL
  Filled 2022-04-18 (×5): qty 1

## 2022-04-18 MED ORDER — ACETAMINOPHEN 325 MG PO TABS: 650.0000 mg | ORAL_TABLET | ORAL | Status: DC | PRN

## 2022-04-18 MED ORDER — SENNOSIDES-DOCUSATE SODIUM 8.6-50 MG PO TABS
2.0000 | ORAL_TABLET | ORAL | Status: DC
  Administered 2022-04-18 – 2022-04-19 (×2): 2 via ORAL
  Filled 2022-04-18 (×2): qty 2

## 2022-04-18 MED ORDER — COCONUT OIL OIL: 1.0000 "application " | TOPICAL_OIL | Status: DC | PRN

## 2022-04-18 MED ORDER — ONDANSETRON HCL 4 MG/2ML IJ SOLN: 4.0000 mg | INTRAMUSCULAR | Status: DC | PRN

## 2022-04-18 MED ORDER — BENZOCAINE-MENTHOL 20-0.5 % EX AERO: 1.0000 "application " | INHALATION_SPRAY | CUTANEOUS | Status: DC | PRN

## 2022-04-18 MED ORDER — ONDANSETRON HCL 4 MG PO TABS: 4.0000 mg | ORAL_TABLET | ORAL | Status: DC | PRN

## 2022-04-18 MED ORDER — PRENATAL MULTIVITAMIN CH
1.0000 | ORAL_TABLET | Freq: Every day | ORAL | Status: DC
  Administered 2022-04-18 – 2022-04-19 (×2): 1 via ORAL
  Filled 2022-04-18 (×2): qty 1

## 2022-04-18 MED ORDER — DIPHENHYDRAMINE HCL 25 MG PO CAPS
25.0000 mg | ORAL_CAPSULE | Freq: Four times a day (QID) | ORAL | Status: DC | PRN
  Administered 2022-04-18: 25 mg via ORAL
  Filled 2022-04-18: qty 1

## 2022-04-18 MED ORDER — WITCH HAZEL-GLYCERIN EX PADS: 1.0000 "application " | MEDICATED_PAD | CUTANEOUS | Status: DC | PRN

## 2022-04-18 MED ORDER — SERTRALINE HCL 50 MG PO TABS
50.0000 mg | ORAL_TABLET | Freq: Every day | ORAL | Status: DC
  Administered 2022-04-18 – 2022-04-19 (×2): 50 mg via ORAL
  Filled 2022-04-18 (×2): qty 1

## 2022-04-18 MED ORDER — TETANUS-DIPHTH-ACELL PERTUSSIS 5-2.5-18.5 LF-MCG/0.5 IM SUSY: 0.5000 mL | PREFILLED_SYRINGE | Freq: Once | INTRAMUSCULAR | Status: DC

## 2022-04-18 NOTE — Lactation Note (Signed)
This note was copied from a baby's chart. Lactation Consultation Note  Patient Name: Terry [name] [id number] Date: 04/18/2022 Reason for consult: Initial assessment;Term Age:28 hours  Initial visit to 9 hours old infant of a P3 mother. Mother states mostly formula feeding.  Reviewed normal newborn behavior during first 24h, expected output, tummy size and feeding frequency. Talked about pace bottled feeding, frequent burping and upright position. Provided formula guideline.  Plan: 1-Feed on demand or 8-12 times in 24h period, volume according to age, 2-Encouraged maternal rest, hydration and food intake.   Contact LC as needed for feeds/support/concerns/questions. All questions answered at this time. Provided Lactation services brochure and promoted [employer] booklet information.      Maternal Data Has patient been taught Hand Expression?: No  Feeding Mother's Current Feeding Choice: Breast Milk and Formula Nipple Type: Slow - flow  Interventions Interventions: Breast feeding basics reviewed;Expressed milk;Pace feeding;Education;Skin to skin;LC Services brochure  Discharge Pump: Personal WIC Program: Yes  Consult Status Consult Status: Follow-up Date: 04/19/22 Follow-up type: In-patient    Terry [name] A Higuera Ancidey 04/18/2022, 12:46 PM

## 2022-04-18 NOTE — Anesthesia Postprocedure Evaluation (Signed)
Anesthesia Post Note  Patient: Terry [name]  Procedure(s) Performed: AN AD HOC LABOR EPIDURAL     Patient location during evaluation: Mother Baby Anesthesia Type: Epidural Level of consciousness: awake and alert Pain management: pain level controlled Vital Signs Assessment: post-procedure vital signs reviewed and stable Respiratory status: spontaneous breathing, nonlabored ventilation and respiratory function stable Cardiovascular status: stable Postop Assessment: no headache, no backache, epidural receding, no apparent nausea or vomiting, patient able to bend at knees, adequate PO intake and able to ambulate Anesthetic complications: no   No notable events documented.  Last Vitals:  Vitals:   04/18/22 0516 04/18/22 0604  BP: 121/61 94/82  Pulse: (!) 101 89  Resp: 16 18  Temp: (!) 36.3 C 36.7 C  SpO2:      Last Pain:  Vitals:   04/18/22 0700  TempSrc:   PainSc: 0-No pain   Pain Goal:                   [place] [employer]

## 2022-04-18 NOTE — Progress Notes (Signed)
MOB was referred for history of depression. * Referral screened out by Clinical Social Worker because none of the following criteria appear to apply: ~ History of anxiety/depression during this pregnancy, or of post-partum depression following prior delivery. ~ Diagnosis of anxiety and/or depression within last 3 years OR * MOB's symptoms currently being treated with medication and/or therapy. Per chart review, MOB is currently prescribed/taking Zoloft.  Please contact the Clinical Social Worker if needs arise, by MOB request, or if MOB scores greater than 9/yes to question 10 on Edinburgh Postpartum Depression Screen.  Takai Chiaramonte, LCSW Clinical Social Worker [facility] Cell#: [phone] 

## 2022-04-18 NOTE — Lactation Note (Signed)
This note was copied from a baby's chart. Lactation Consultation Note  Patient Name: Terry [name] [id number] Date: 04/18/2022 Age:28 hours  Mom and baby are sleeping upon visit. LC will come back to room at another time as possible.     Riniyah Speich A Higuera Ancidey 04/18/2022, 10:27 AM

## 2022-04-18 NOTE — Discharge Summary (Shared)
Postpartum Discharge Summary  Date of Service updated-yes     Patient Name: Terry [name] DOB: 1995/11/19 MRN: [id number]  Date of admission: 04/17/2022 Delivery date:04/18/2022  Delivering provider: Seabron Spates  Date of discharge: 04/19/2022  Admitting diagnosis: Supervision of other normal pregnancy, antepartum [Z34.80] Intrauterine pregnancy: [redacted]w[redacted]d    Secondary diagnosis:  Principal Problem:   Supervision of other normal pregnancy, antepartum Active Problems:   Depression affecting pregnancy   History of prior pregnancy with short cervix, currently pregnant   Vaginal delivery  Additional problems: none    Discharge diagnosis: Term Pregnancy Delivered                                              Post partum procedures: Post Placental Mirena IUD Augmentation: AROM Complications: None  Hospital course: Induction of Labor With Vaginal Delivery   26y.o. yo [id number] [id number] admitted to the hospital 04/17/2022 for induction of labor.  Indication for induction: Elective.  Patient had an uncomplicated labor course as follows: Membrane Rupture Time/Date: 11:36 PM ,04/17/2022   Delivery Method:Vaginal, Spontaneous  Episiotomy: None  Lacerations:  None  Details of delivery can be found in separate delivery note.  Patient had a routine postpartum course. Patient is discharged home 04/19/22.  Newborn Data: Birth date:04/18/2022  Birth time:3:31 AM  Gender:Female  Living status:Living  Apgars:8 ,9  Weight:3810 g   Magnesium Sulfate received: No BMZ received: No Rhophylac:N/A MMR:N/A T-DaP:Given prenatally Flu: Yes Transfusion:No  Physical exam  Vitals:   04/18/22 1035 04/18/22 1540 04/18/22 1945 04/19/22 1100  BP: 107/67 (!) 110/52 113/70 118/66  Pulse: 83 79 70 88  Resp: _0 Temp: 97.9 F (36.6 C) 97.8 F (36.6 C) 98.2 F (36.8 C) 98.1 F (36.7 C)  TempSrc: Oral Oral Axillary Axillary  SpO2: 99% 100%  98%  Weight:      Height:        General: alert, cooperative, and no distress Lochia: appropriate Uterine Fundus: firm Incision: N/A DVT Evaluation: No evidence of DVT seen on physical exam. Negative Homan's sign. No cords or calf tenderness. No significant calf/ankle edema. Labs: Lab Results  Component Value Date   WBC 11.8 (H) 04/17/2022   HGB 10.9 (L) 04/17/2022   HCT 32.0 (L) 04/17/2022   MCV 94.1 04/17/2022   PLT 225 04/17/2022      Latest Ref Rng & Units 07/27/2020    4:03 PM  CMP  Glucose 70 - 99 mg/dL 100   BUN 6 - 20 mg/dL 6   Creatinine 0.44 - 1.00 mg/dL 0.66   Sodium 135 - 145 mmol/L 134   Potassium 3.5 - 5.1 mmol/L 3.7   Chloride 98 - 111 mmol/L 102   CO2 22 - 32 mmol/L 23   Calcium 8.9 - 10.3 mg/dL 8.2   Total Protein 6.5 - 8.1 g/dL 5.1   Total Bilirubin 0.3 - 1.2 mg/dL 0.5   Alkaline Phos 38 - 126 U/L 85   AST 15 - 41 U/L 26   ALT 0 - 44 U/L 17    Edinburgh Score:    07/29/2020   10:20 AM  Edinburgh Postnatal Depression Scale Screening Tool  I have been able to laugh and see the funny side of things. 2  I have looked forward with enjoyment to things. 2  I have  blamed myself unnecessarily when things went wrong. 3  I have been anxious or worried for no good reason. 0  I have felt scared or panicky for no good reason. 0  Things have been getting on top of me. 1  I have been so unhappy that I have had difficulty sleeping. 0  I have felt sad or miserable. 1  I have been so unhappy that I have been crying. 2  The thought of harming myself has occurred to me. 0  Edinburgh Postnatal Depression Scale Total 11      After visit meds:  Allergies as of 04/19/2022   No Known Allergies      Medication List     STOP taking these medications    traMADol 50 MG tablet Commonly known as: ULTRAM       TAKE these medications    ibuprofen 600 MG tablet Commonly known as: ADVIL Take 1 tablet (600 mg total) by mouth every 6 (six) hours.   sertraline 50 MG tablet Commonly known  as: Zoloft Take 1 tablet (50 mg total) by mouth daily.   Vitafol Gummies 3.33-0.333-34.8 MG Chew Chew 1 tablet by mouth daily.         Discharge home in stable condition Infant Feeding: Breast Infant Disposition:home with mother Discharge instruction: per After Visit Summary and Postpartum booklet. Activity: Advance as tolerated. Pelvic rest for 6 weeks.  Diet: routine diet Anticipated Birth Control: PP IUD placed Postpartum Appointment:4 weeks Additional Postpartum F/U: string check  Future Appointments  Date Time Provider [place]  05/24/2022 10:45 AM Johnston Ebbs, NP [place] None         04/19/2022 Julianne Handler, CNM

## 2022-04-19 ENCOUNTER — Encounter: Payer: Self-pay | Admitting: Family Medicine

## 2022-04-19 DIAGNOSIS — Z975 Presence of (intrauterine) contraceptive device: Secondary | ICD-10-CM | POA: Insufficient documentation

## 2022-04-19 MED ORDER — IBUPROFEN 600 MG PO TABS: 600.0000 mg | ORAL_TABLET | Freq: Four times a day (QID) | ORAL | 0 refills | Status: DC

## 2022-04-19 NOTE — Lactation Note (Signed)
This note was copied from a baby's chart. Lactation Consultation Note  Patient Name: Terry [name] [id number] Date: 04/19/2022 Reason for consult: Follow-up assessment;Term;Infant weight loss (4.99% WL) Age:28 hours  P3, Term, Infant Female  LC entered the room and mom stated that she had already been seen by lactation for today. LC let mom know that she was there to check on her to ensure that things were going well with breastfeeding. Per mom, breastfeeding is going well and she is fine for now.   Mom denies having questions or concerns at the present moment.   Feeding Mother's Current Feeding Choice: Breast Milk and Formula    Consult Status Consult Status: Follow-up Date: 04/20/22 Follow-up type: In-patient    Delene Loll 04/19/2022, 12:33 PM

## 2022-04-19 NOTE — Progress Notes (Signed)
POSTPARTUM PROGRESS NOTE  Post Partum Day 1  Subjective:  Terry [name] is a 26 y.o. [id number] s/p SVD at [redacted]w[redacted]d. No acute events overnight. Patient denies problems with ambulating, voiding or PO intake. She denies nausea or vomiting. Pain is well controlled. She endorses some mild lower abdominal pain that improves with Ibuprofen. She has had flatus. She has not had bowel movement. Bleeding is similar to a period. No other concerns at this time.   Objective: Blood pressure 113/70, pulse 70, temperature 98.2 F (36.8 C), temperature source Axillary, resp. rate 16, height 5\' 10"  (1.778 m), weight 73.5 kg, last menstrual period 07/23/2021, SpO2 100 %, unknown if currently breastfeeding.  Physical Exam:  General: alert, cooperative and no distress Resp: normal work of breathing on room air Heart: normal rate, warm and well perfused Abdomen: soft, nontender Uterine Fundus: firm and at umbilicus DVT Evaluation: No calf swelling or tenderness Extremities: No LE edema or calf tenderness to palpation Skin: warm, dry  Recent Labs    04/17/22 1940  HGB 10.9*  HCT 32.0*    Assessment/Plan: Terry [name] is a 26 y.o. [id number] s/p SVD at [redacted]w[redacted]d.   PPD#1: Doing well. Meeting all milestones. VSS. Continue routine PP care.  Contraception: Received post-placental IUD  Circumcision: Yes - prior to discharge  Feeding: Breast and bottle, lactation following  Dispo: Plan for discharge on PPD1.   LOS: 2 days   [name], Student-PA 04/19/2022, 8:38 AM

## 2022-04-19 NOTE — Social Work (Signed)
CSW received consult for Edinburgh 12.  CSW met with MOB to offer support and complete assessment.    CSW met with MOB at bedside and introduced role. CSW observed MOB in bed with the infant next to her and two young daughters present. MOB gave CSW permission to share a information with her children present. CSW explained the reason for the visit, to discuss the Edinburgh. MOB shared for the past seven days she has been feeling exhausted and stressed. MOB reported that she often can not focus and does not feel present most of the time, "it's like I am present but my mind is not there." MOB reported she is easily distracted and has a difficult time completing task at work. MOB reported, " I have been feeling this way for a while." MOB expressed that was diagnosed with depression about five year ago and prescribed Zoloft foy symptoms which she feels is "helpful some days." MOB shared that the medication makes her drowsy and it is difficult to take and go to work. MOB expressed interest in trying another medication. CSW discussed the benefits of following up with a psychiatrist to determine the root cause of her emotions and prescribe medications accordingly. MOB expressed that she is very interested in seeing a psychiatrist. CSW provided MOB with a list of mental health providers/psychiatrist in the area and encouraged MOB to follow up. MOB reported that she currently sees [facility] counselor Andrea for psychotherapy. CSW inquired about supports. MOB identified her mom and FOB as supports.   CSW provided education regarding the baby blues period vs. perinatal mood disorders, discussed treatment and gave resources for mental health follow up if concerns arise. CSW recommended self-evaluation during the postpartum time period using the New Mom Checklist from Postpartum Progress and encouraged MOB to contact a medical professional if symptoms are noted at any time.    MOB reported she has all items for the infant  including a bassinet where the infant will sleep. CSW provided review of Sudden Infant Death Syndrome (SIDS) precautions. MOB has chosen [facility] for the infant's follow up care. CSW assessed MOB for additional needs. MOB reported no further need.   CSW identifies no further need for intervention and no barriers to discharge at this time.   Terrell Ostrand, MSW, LCSW [facility]  Clinical Social Worker  [phone] 04/19/2022  3:01 PM  

## 2022-04-27 ENCOUNTER — Telehealth (HOSPITAL_COMMUNITY): Payer: Self-pay

## 2022-04-27 DIAGNOSIS — Z1331 Encounter for screening for depression: Secondary | ICD-10-CM

## 2022-04-27 NOTE — Telephone Encounter (Signed)
"  We're doing fine. I've been having some cramping and pain in my back." RN reviewed uterine involution and postpartum cramping. RN also told patient that some soreness at the epidural site is common and should improve with time. RN told patient to reach out to her OB with concerns. Patient has no other questions or concerns about her healing.   "He's fine. Eating and gaining weight. He sleeps in a crib." RN reviewed ABC's of safe sleep with patient. Patient declines any questions or concerns about baby.  EPDS score is 10. Referral placed for integrated behavioral health.   RN told patient about Maternal Mental Health Resources ([facility], National Maternal Mental Health Hotline, Postpartum [employer]). Also told patient about [facility] support group offerings. Will e-mail these resources to patient as well.  Terry [name] [facility] 06/20//2023,1844

## 2022-05-24 ENCOUNTER — Ambulatory Visit: Payer: Medicaid Other | Admitting: Student

## 2023-03-11 ENCOUNTER — Encounter: Payer: Self-pay | Admitting: Obstetrics and Gynecology

## 2023-03-11 ENCOUNTER — Other Ambulatory Visit (HOSPITAL_COMMUNITY)
Admission: RE | Admit: 2023-03-11 | Discharge: 2023-03-11 | Disposition: A | Discharge status: Home / Self Care | Payer: No Typology Code available for payment source | Source: Ambulatory Visit | Attending: Obstetrics and Gynecology | Admitting: Obstetrics and Gynecology

## 2023-03-11 ENCOUNTER — Ambulatory Visit (INDEPENDENT_AMBULATORY_CARE_PROVIDER_SITE_OTHER): Payer: No Typology Code available for payment source | Admitting: Obstetrics and Gynecology

## 2023-03-11 VITALS — BP 122/78 | HR 74 | Ht 70.0 in | Wt 181.0 lb

## 2023-03-11 DIAGNOSIS — Z01419 Encounter for gynecological examination (general) (routine) without abnormal findings: Secondary | ICD-10-CM | POA: Insufficient documentation

## 2023-03-11 DIAGNOSIS — Z113 Encounter for screening for infections with a predominantly sexual mode of transmission: Secondary | ICD-10-CM | POA: Insufficient documentation

## 2023-03-11 DIAGNOSIS — Z3042 Encounter for surveillance of injectable contraceptive: Secondary | ICD-10-CM

## 2023-03-11 DIAGNOSIS — N644 Mastodynia: Secondary | ICD-10-CM

## 2023-03-11 LAB — POCT URINE PREGNANCY: Preg Test, Ur: NEGATIVE

## 2023-03-11 MED ORDER — MEDROXYPROGESTERONE ACETATE 150 MG/ML IM SUSP: 150.0000 mg | INTRAMUSCULAR | 4 refills | Status: DC

## 2023-03-11 MED ORDER — MEDROXYPROGESTERONE ACETATE 150 MG/ML IM SUSP
150.0000 mg | Freq: Once | INTRAMUSCULAR | Status: AC
  Administered 2023-03-11: 150 mg via INTRAMUSCULAR

## 2023-03-11 NOTE — Progress Notes (Signed)
GYNECOLOGY ANNUAL PREVENTATIVE CARE ENCOUNTER NOTE  History:     Terry [name] is a 27 y.o. [phone] female here for a routine annual gynecologic exam.  Current complaints: right breast pain x 1 month.   Denies abnormal vaginal bleeding, discharge, pelvic pain, problems with intercourse.   Gynecologic History Patient's last menstrual period was 02/21/2023 (approximate). Contraception: Depo-Provera injections Last Pap: 3/21. Results were: normal with negative HPV Last mammogram: n/a.   Obstetric History OB History  Gravida Para Term Preterm AB Living  5 3 3   2 3   SAB IAB Ectopic Multiple Live Births  1 1   0 3    # Outcome Date GA Lbr Len/2nd Weight Sex Delivery Anes PTL Lv  5 Term 04/18/22 [redacted]w[redacted]d 03:44 / 00:11 8 lb 6.4 oz (3.81 kg) M Vag-Spont EPI  LIV  4 IAB 05/05/21 [redacted]w[redacted]d         3 Term 07/27/20 [redacted]w[redacted]d 01:49 / 00:16 7 lb 14.3 oz (3.58 kg) F Vag-Spont None  LIV  2 SAB 07/2019          1 Term 12/28/16 [redacted]w[redacted]d 07:57 / 02:31 8 lb 7.1 oz (3.83 kg) F Vag-Spont EPI  LIV    Past Medical History:  Diagnosis Date   Chlamydia    Depression    Gonorrhea    Pregnancy induced hypertension     Past Surgical History:  Procedure Laterality Date   NO PAST SURGERIES      Current Outpatient Medications on File Prior to Visit  Medication Sig Dispense Refill   ibuprofen (ADVIL) 600 MG tablet Take 1 tablet (600 mg total) by mouth every 6 (six) hours. 30 tablet 0   Prenatal Vit-Fe Phos-FA-Omega (VITAFOL GUMMIES) 3.33-0.333-34.8 MG CHEW Chew 1 tablet by mouth daily. 90 tablet 4   sertraline (ZOLOFT) 50 MG tablet Take 1 tablet (50 mg total) by mouth daily. 30 tablet 11   No current facility-administered medications on file prior to visit.    No Known Allergies  Social History:  reports that she has never smoked. She has never used smokeless tobacco. She reports that she does not drink alcohol and does not use drugs.  Family History  Problem Relation Age of Onset   Cancer Maternal  Grandmother        pancreatic   Diabetes Maternal Uncle    Diabetes Mother    Hypertension Mother    Diabetes Father     The following portions of the patient's history were reviewed and updated as appropriate: allergies, current medications, past family history, past medical history, past social history, past surgical history and problem list.  Review of Systems Pertinent items noted in HPI and remainder of comprehensive ROS otherwise negative.  Physical Exam:  BP 122/78   Pulse 74   Ht 5\' 10"  (1.778 m)   Wt 181 lb (82.1 kg)   LMP 02/21/2023 (Approximate)   BMI 25.97 kg/m  CONSTITUTIONAL: Well-developed, well-nourished female in no acute distress.  HENT:  Normocephalic, atraumatic, External right and left ear normal. Oropharynx is clear and moist EYES: Conjunctivae and EOM are normal.   NECK: Normal range of motion, supple, no masses.  Normal thyroid.  SKIN: Skin is warm and dry. No rash noted. Not diaphoretic. No erythema. No pallor. MUSCULOSKELETAL: Normal range of motion. No tenderness.  No cyanosis, clubbing, or edema.  2+ distal pulses. NEUROLOGIC: Alert and oriented to person, place, and time. Normal reflexes, muscle tone coordination.  PSYCHIATRIC: Normal mood and affect. Normal behavior.  Normal judgment and thought content. CARDIOVASCULAR: Normal heart rate noted, regular rhythm RESPIRATORY: Clear to auscultation bilaterally. Effort and breath sounds normal, no problems with respiration noted. BREASTS: Symmetric in size. No masses, skin changes, nipple drainage, or lymphadenopathy bilaterally. Performed in the presence of a chaperone. Discomfort noted with superficial and deep palpation. ABDOMEN: Soft, no distention noted.  No tenderness, rebound or guarding.  PELVIC: Normal appearing external genitalia and urethral meatus; normal appearing vaginal mucosa and cervix.  No abnormal discharge noted.  Pap smear obtained. Vaginal swab taken. Normal uterine size, no other palpable  masses, no uterine or adnexal tenderness.  Performed in the presence of a chaperone.   Assessment and Plan:    1. Women's annual routine gynecological examination [Z01.419] Normal annual exam noted  - Cytology - PAP( [place]) - POCT urine pregnancy  2. Routine screening for STI (sexually transmitted infection) Per pt request - Cervicovaginal ancillary only( [place]) - RPR - HIV antibody (with reflex) - Hepatitis C Antibody - Hepatitis B Surface AntiGEN - POCT urine pregnancy  3. Breast pain Will check breast  ultrasound, possibility of neurological etiology.  Consider gabapentin or neuro consult - [place] LIMITED ULTRASOUND INCLUDING AXILLA RIGHT BREAST; Future  4. Encounter for Depo-Provera contraception  - medroxyPROGESTERone (DEPO-PROVERA) 150 MG/ML injection; Inject 1 mL (150 mg total) into the muscle every 3 (three) months.  Dispense: 1 mL; Refill: 4 - medroxyPROGESTERone (DEPO-PROVERA) injection 150 mg  Will follow up results of pap smear and manage accordingly. Breast ultrasound scheduled Routine preventative health maintenance measures emphasized. Please refer to After Visit Summary for other counseling recommendations.      Mariel Aloe, MD, FACOG Obstetrician & Gynecologist, [facility] for [facility], [facility]

## 2023-03-11 NOTE — Progress Notes (Addendum)
27 y.o. GYN presents for AEX/PAP/STD screening.  C/o intermittent sharp, tingling pain in right breast x 1 month.  Pt wants to start Depo today.  UPT is  Negative  DEPO Injection given in RUOQ, tolerated well.   Next DEPO due July 19 - June 10, 2023  Administrations This Visit     medroxyPROGESTERone (DEPO-PROVERA) injection 150 mg     Admin Date 03/11/2023 Action Given Dose 150 mg Route Intramuscular Administered By Maretta Bees, RMA

## 2023-03-12 LAB — HEPATITIS B SURFACE ANTIGEN: Hepatitis B Surface Ag: NEGATIVE

## 2023-03-12 LAB — RPR: RPR Ser Ql: NONREACTIVE

## 2023-03-12 LAB — HEPATITIS C ANTIBODY: Hep C Virus Ab: NONREACTIVE

## 2023-03-12 LAB — HIV ANTIBODY (ROUTINE TESTING W REFLEX): HIV Screen 4th Generation wRfx: NONREACTIVE

## 2023-03-14 ENCOUNTER — Encounter: Payer: Self-pay | Admitting: *Deleted

## 2023-03-14 ENCOUNTER — Encounter: Payer: Self-pay | Admitting: Obstetrics and Gynecology

## 2023-03-14 LAB — CERVICOVAGINAL ANCILLARY ONLY
Chlamydia: NEGATIVE
Comment: NEGATIVE
Comment: NEGATIVE
Comment: NORMAL
Neisseria Gonorrhea: NEGATIVE
Trichomonas: NEGATIVE

## 2023-03-15 LAB — CYTOLOGY - PAP
Comment: NEGATIVE
Diagnosis: NEGATIVE
High risk HPV: NEGATIVE

## 2023-03-30 ENCOUNTER — Inpatient Hospital Stay: Admission: RE | Admit: 2023-03-30 | Payer: No Typology Code available for payment source | Source: Ambulatory Visit

## 2023-06-09 ENCOUNTER — Ambulatory Visit: Payer: No Typology Code available for payment source

## 2023-07-28 ENCOUNTER — Encounter (HOSPITAL_COMMUNITY): Payer: Self-pay | Admitting: Emergency Medicine

## 2023-07-28 ENCOUNTER — Ambulatory Visit (HOSPITAL_COMMUNITY)
Admission: EM | Admit: 2023-07-28 | Discharge: 2023-07-28 | Disposition: A | Discharge status: Home / Self Care | Payer: No Typology Code available for payment source | Attending: Emergency Medicine | Admitting: Emergency Medicine

## 2023-07-28 DIAGNOSIS — N898 Other specified noninflammatory disorders of vagina: Secondary | ICD-10-CM | POA: Diagnosis present

## 2023-07-28 DIAGNOSIS — R300 Dysuria: Secondary | ICD-10-CM | POA: Insufficient documentation

## 2023-07-28 LAB — POCT URINALYSIS DIP (MANUAL ENTRY)
Bilirubin, UA: NEGATIVE
Blood, UA: NEGATIVE
Glucose, UA: NEGATIVE mg/dL
Ketones, POC UA: NEGATIVE mg/dL
Leukocytes, UA: NEGATIVE
Nitrite, UA: NEGATIVE
Protein Ur, POC: NEGATIVE mg/dL
Spec Grav, UA: 1.025 (ref 1.010–1.025)
Urobilinogen, UA: 1 E.U./dL
pH, UA: 6 (ref 5.0–8.0)

## 2023-07-28 LAB — POCT URINE PREGNANCY: Preg Test, Ur: NEGATIVE

## 2023-07-28 NOTE — ED Triage Notes (Signed)
Pt reports that her IUD came out on its own few days ago. Reports dysuria, as well as white, chunky discharge and mild odor.

## 2023-07-28 NOTE — ED Provider Notes (Signed)
[facility]    CSN: [id number] Arrival date & time: 07/28/23  4098      History   Chief Complaint Chief Complaint  Patient presents with   Dysuria    HPI Terry [name] is a 27 y.o. female.   Patient presents with dysuria and vaginal discharge with mild odor for a few days. Patient states her IUD fell out on its own a few days ago and symptoms began shortly after that.  Patient endorses some mild abdominal cramping and spotting. Denies severe abdominal pain, back pain, vomiting, excessive vaginal bleeding, and fever.   Dysuria Associated symptoms: vaginal discharge   Associated symptoms: no abdominal pain, no fever, no flank pain, no nausea and no vomiting     Past Medical History:  Diagnosis Date   Chlamydia    Depression    Gonorrhea    Pregnancy induced hypertension     Patient Active Problem List   Diagnosis Date Noted   IUD (intrauterine device) in place 04/19/2022   Vaginal delivery 04/18/2022   History of prior pregnancy with short cervix, currently pregnant 02/25/2020   Depression affecting pregnancy 01/29/2020   Supervision of other normal pregnancy, antepartum 01/18/2020    Past Surgical History:  Procedure Laterality Date   NO PAST SURGERIES      OB History     Gravida  5   Para  3   Term  3   Preterm      AB  2   Living  3      SAB  1   IAB  1   Ectopic      Multiple  0   Live Births  3            Home Medications    Prior to Admission medications   Medication Sig Start Date End Date Taking? Authorizing Provider  ibuprofen (ADVIL) 600 MG tablet Take 1 tablet (600 mg total) by mouth every 6 (six) hours. 04/19/22   Donette Larry, CNM  sertraline (ZOLOFT) 50 MG tablet Take 1 tablet (50 mg total) by mouth daily. 10/15/21   Hermina Staggers, MD    Family History Family History  Problem Relation Age of Onset   Cancer Maternal Grandmother        pancreatic   Diabetes Maternal Uncle    Diabetes Mother     Hypertension Mother    Diabetes Father     Social History Social History   Tobacco Use   Smoking status: Never   Smokeless tobacco: Never  Vaping Use   Vaping status: Never Used  Substance Use Topics   Alcohol use: No   Drug use: No     Allergies   Patient has no known allergies.   Review of Systems Review of Systems  Constitutional:  Negative for chills, fatigue and fever.  Gastrointestinal:  Negative for abdominal pain, nausea and vomiting.  Genitourinary:  Positive for dysuria, menstrual problem, pelvic pain, vaginal bleeding and vaginal discharge. Negative for difficulty urinating, flank pain, frequency, hematuria, urgency and vaginal pain.  Musculoskeletal:  Negative for back pain.     Physical Exam Triage Vital Signs ED Triage Vitals  Encounter Vitals Group     BP 07/28/23 0847 119/83     Systolic BP Percentile --      Diastolic BP Percentile --      Pulse Rate 07/28/23 0847 68     Resp 07/28/23 0847 16     Temp 07/28/23 0847 98.3 F (  36.8 C)     Temp Source 07/28/23 0847 Oral     SpO2 07/28/23 0847 97 %     Weight --      Height --      Head Circumference --      Peak Flow --      Pain Score 07/28/23 0845 6     Pain Loc --      Pain Education --      Exclude from Growth Chart --    No data found.  Updated Vital Signs BP 119/83 (BP Location: Right Arm)   Pulse 68   Temp 98.3 F (36.8 C) (Oral)   Resp 16   SpO2 97%   Visual Acuity Right Eye Distance:   Left Eye Distance:   Bilateral Distance:    Right Eye Near:   Left Eye Near:    Bilateral Near:     Physical Exam Vitals and nursing note reviewed.  Constitutional:      General: She is awake. She is not in acute distress.    Appearance: Normal appearance. She is well-developed and well-groomed. She is not ill-appearing, toxic-appearing or diaphoretic.  Abdominal:     General: Abdomen is flat. There is no distension.     Palpations: Abdomen is soft.     Tenderness: There is no  abdominal tenderness. There is no right CVA tenderness, left CVA tenderness or guarding.  Genitourinary:    Comments: Exam deferred.  Skin:    General: Skin is warm and dry.  Neurological:     Mental Status: She is alert.  Psychiatric:        Behavior: Behavior is cooperative.      UC Treatments / Results  Labs (all labs ordered are listed, but only abnormal results are displayed) Labs Reviewed  POCT URINALYSIS DIP (MANUAL ENTRY) - Abnormal; Notable for the following components:      Result Value   Color, UA orange (*)    All other components within normal limits  POCT URINE PREGNANCY  CERVICOVAGINAL ANCILLARY ONLY    EKG   Radiology No results found.  Procedures Procedures (including critical care time)  Medications Ordered in UC Medications - No data to display  Initial Impression / Assessment and Plan / UC Course  I have reviewed the triage vital signs and the nursing notes.  Pertinent labs & imaging results that were available during my care of the patient were reviewed by me and considered in my medical decision making (see chart for details).     Patient presented with dysuria and vaginal discharge with mild odor for a few days since her IUD fell out on its own. Patient endorses some mild abdominal cramping and spotting. Denies severe abdominal pain, back pain, vomiting, excessive vaginal bleeding, and fever. No significant findings upon assessment.  GU exam deferred. Patient performed self swab for STD/STI.  Patient declined HIV and syphilis testing. Urine pregnancy was negative and UA results were insignificant. Discussed following up with OB/GYN regarding placement of new IUD. Discussed follow-up, return, and emergency department precautions. Final Clinical Impressions(s) / UC Diagnoses   Final diagnoses:  Vaginal discharge  Dysuria     Discharge Instructions      Your results will come back over the next few days and someone will call you if results  are positive and adjust treatment as needed. Follow-up with OBGYN regarding placement of new IUD. Return here as needed. If you develop severe abdominal pain/cramping, back pain, fever, or excessive  vaginal bleeding please seek immediate medical treatment at the ER.     ED Prescriptions   None    PDMP not reviewed this encounter.   Wynonia Lawman A, NP 07/28/23 [phone]

## 2023-07-28 NOTE — Discharge Instructions (Signed)
Your results will come back over the next few days and someone will call you if results are positive and adjust treatment as needed. Follow-up with OBGYN regarding placement of new IUD. Return here as needed. If you develop severe abdominal pain/cramping, back pain, fever, or excessive vaginal bleeding please seek immediate medical treatment at the ER.

## 2023-07-29 ENCOUNTER — Telehealth: Payer: Self-pay | Admitting: Emergency Medicine

## 2023-07-29 LAB — CERVICOVAGINAL ANCILLARY ONLY
Bacterial Vaginitis (gardnerella): POSITIVE — AB
Candida Glabrata: NEGATIVE
Candida Vaginitis: NEGATIVE
Chlamydia: NEGATIVE
Comment: NEGATIVE
Comment: NEGATIVE
Comment: NEGATIVE
Comment: NEGATIVE
Comment: NEGATIVE
Comment: NORMAL
Neisseria Gonorrhea: NEGATIVE
Trichomonas: NEGATIVE

## 2023-07-29 MED ORDER — METRONIDAZOLE 500 MG PO TABS: 500.0000 mg | ORAL_TABLET | Freq: Two times a day (BID) | ORAL | 0 refills | Status: DC

## 2023-07-29 NOTE — Telephone Encounter (Signed)
Metronidazole for positive BV

## 2024-01-12 ENCOUNTER — Ambulatory Visit
Admission: EM | Admit: 2024-01-12 | Discharge: 2024-01-12 | Disposition: A | Discharge status: Home / Self Care | Attending: Family Medicine | Admitting: Family Medicine

## 2024-01-12 DIAGNOSIS — N898 Other specified noninflammatory disorders of vagina: Secondary | ICD-10-CM | POA: Diagnosis present

## 2024-01-12 DIAGNOSIS — Z202 Contact with and (suspected) exposure to infections with a predominantly sexual mode of transmission: Secondary | ICD-10-CM | POA: Insufficient documentation

## 2024-01-12 LAB — POCT URINE PREGNANCY: Preg Test, Ur: NEGATIVE

## 2024-01-12 NOTE — Discharge Instructions (Addendum)
 We will notify you of your test results tomorrow and treat any kind of infection you test positive for.

## 2024-01-12 NOTE — ED Provider Notes (Signed)
 [facility]  Note:  This document was prepared using Conservation officer, historic buildings and may include unintentional dictation errors.  MRN: [id number] DOB: 1995/11/27  Subjective:   Terry [name] is a 28 y.o. female presenting for 2-day history of vaginal discharge.  She has had a new sex partner for the past 2 months.  Was not aware that he had any kind of infection.  This past week he got testing and advised the patient to come in and get tested since he had an STD.  He did not inform her which results were positive for him.  No fever, nausea, vomiting, abdominal or pelvic pain.  No current facility-administered medications for this encounter.  Current Outpatient Medications:    ibuprofen (ADVIL) 600 MG tablet, Take 1 tablet (600 mg total) by mouth every 6 (six) hours., Disp: 30 tablet, Rfl: 0   metroNIDAZOLE (FLAGYL) 500 MG tablet, Take 1 tablet (500 mg total) by mouth 2 (two) times daily., Disp: 14 tablet, Rfl: 0   sertraline (ZOLOFT) 50 MG tablet, Take 1 tablet (50 mg total) by mouth daily., Disp: 30 tablet, Rfl: 11   No Known Allergies  Past Medical History:  Diagnosis Date   Chlamydia    Depression    Gonorrhea    Pregnancy induced hypertension      Past Surgical History:  Procedure Laterality Date   NO PAST SURGERIES      Family History  Problem Relation Age of Onset   Cancer Maternal Grandmother        pancreatic   Diabetes Maternal Uncle    Diabetes Mother    Hypertension Mother    Diabetes Father     Social History   Tobacco Use   Smoking status: Never   Smokeless tobacco: Never  Vaping Use   Vaping status: Never Used  Substance Use Topics   Alcohol use: Yes    Comment: weekly   Drug use: No    ROS   Objective:   Vitals: BP (!) 167/91 (BP Location: Right Arm)   Pulse 77   Temp 98.1 F (36.7 C) (Oral)   Resp 16   SpO2 96%   Physical Exam Constitutional:      General: She is not in acute distress.     Appearance: Normal appearance. She is well-developed. She is not ill-appearing, toxic-appearing or diaphoretic.  HENT:     Head: Normocephalic and atraumatic.     Nose: Nose normal.     Mouth/Throat:     Mouth: Mucous membranes are moist.  Eyes:     General: No scleral icterus.       Right eye: No discharge.        Left eye: No discharge.     Extraocular Movements: Extraocular movements intact.  Cardiovascular:     Rate and Rhythm: Normal rate.  Pulmonary:     Effort: Pulmonary effort is normal.  Skin:    General: Skin is warm and dry.  Neurological:     General: No focal deficit present.     Mental Status: She is alert and oriented to person, place, and time.  Psychiatric:        Mood and Affect: Mood normal.        Behavior: Behavior normal.     Results for orders placed or performed during the hospital encounter of 01/12/24 (from the past 24 hours)  POCT urine pregnancy     Status: Normal   Collection Time: 01/12/24  7:17  PM  Result Value Ref Range   Preg Test, Ur Negative     Assessment and Plan :   PDMP not reviewed this encounter.  1. Vaginal discharge   2. Exposure to sexually transmitted disease (STD)    Patient declined empiric treatment.  She prefers to treat based off the results.   Wallis Bamberg, [place] 01/12/24 1944

## 2024-01-12 NOTE — ED Triage Notes (Signed)
 Pt requesting STD testing-vaginal d/c x 2 days-NAD-steady gait

## 2024-01-13 ENCOUNTER — Telehealth (HOSPITAL_COMMUNITY): Payer: Self-pay

## 2024-01-13 LAB — CERVICOVAGINAL ANCILLARY ONLY
Bacterial Vaginitis (gardnerella): NEGATIVE
Candida Glabrata: NEGATIVE
Candida Vaginitis: NEGATIVE
Chlamydia: POSITIVE — AB
Comment: NEGATIVE
Comment: NEGATIVE
Comment: NEGATIVE
Comment: NEGATIVE
Comment: NEGATIVE
Comment: NORMAL
Neisseria Gonorrhea: NEGATIVE
Trichomonas: NEGATIVE

## 2024-01-13 MED ORDER — DOXYCYCLINE HYCLATE 100 MG PO TABS: 100.0000 mg | ORAL_TABLET | Freq: Two times a day (BID) | ORAL | 0 refills | Status: AC

## 2024-01-13 NOTE — Telephone Encounter (Signed)
 Per protocol, pt requires tx with Doxycycline.  Reviewed with patient, verified pharmacy, prescription sent.

## 2024-02-22 ENCOUNTER — Ambulatory Visit
Admission: EM | Admit: 2024-02-22 | Discharge: 2024-02-22 | Disposition: A | Discharge status: Home / Self Care | Attending: Family Medicine | Admitting: Family Medicine

## 2024-02-22 DIAGNOSIS — Z8619 Personal history of other infectious and parasitic diseases: Secondary | ICD-10-CM | POA: Insufficient documentation

## 2024-02-22 DIAGNOSIS — R102 Pelvic and perineal pain: Secondary | ICD-10-CM

## 2024-02-22 MED ORDER — NAPROXEN 500 MG PO TABS: 500.0000 mg | ORAL_TABLET | Freq: Two times a day (BID) | ORAL | 0 refills | Status: DC

## 2024-02-22 NOTE — ED Triage Notes (Signed)
 Pt states that she has some vaginal discomfort and abdominal pain. X2 days

## 2024-02-22 NOTE — ED Provider Notes (Signed)
 [facility]  Note:  This document was prepared using Conservation officer, historic buildings and may include unintentional dictation errors.  MRN: [id number] DOB: 06/01/1996  Subjective:   Terry [name] is a 28 y.o. female presenting for 2 day history of vaginal discomfort and abdominal/pelvic pain.  No urinary symptoms, fever, nausea, vomiting, vaginal discharge.  No genital rashes.  Patient did get treated for chlamydia about a month ago.  She finished the medication and would like to be rechecked to make sure she cleared the infection but also because she never felt quite right since then.  No current facility-administered medications for this encounter.  Current Outpatient Medications:    sertraline (ZOLOFT) 50 MG tablet, Take 1 tablet (50 mg total) by mouth daily., Disp: 30 tablet, Rfl: 11   No Known Allergies  Past Medical History:  Diagnosis Date   Chlamydia    Depression    Gonorrhea    Pregnancy induced hypertension      Past Surgical History:  Procedure Laterality Date   NO PAST SURGERIES      Family History  Problem Relation Age of Onset   Cancer Maternal Grandmother        pancreatic   Diabetes Maternal Uncle    Diabetes Mother    Hypertension Mother    Diabetes Father     Social History   Tobacco Use   Smoking status: Never   Smokeless tobacco: Never  Vaping Use   Vaping status: Never Used  Substance Use Topics   Alcohol use: Yes    Comment: weekly   Drug use: No    ROS   Objective:   Vitals: BP 129/80 (BP Location: Left Arm)   Pulse 81   Temp 98.4 F (36.9 C) (Oral)   Resp 17   Ht 5\' 11"  (1.803 m)   Wt 186 lb 14.4 oz (84.8 kg)   LMP 02/10/2024   SpO2 98%   BMI 26.07 kg/m   Physical Exam Constitutional:      General: She is not in acute distress.    Appearance: Normal appearance. She is well-developed. She is not ill-appearing, toxic-appearing or diaphoretic.  HENT:     Head: Normocephalic and atraumatic.      Nose: Nose normal.     Mouth/Throat:     Mouth: Mucous membranes are moist.  Eyes:     General: No scleral icterus.       Right eye: No discharge.        Left eye: No discharge.     Extraocular Movements: Extraocular movements intact.     Conjunctiva/sclera: Conjunctivae normal.  Cardiovascular:     Rate and Rhythm: Normal rate.  Pulmonary:     Effort: Pulmonary effort is normal.  Abdominal:     General: Bowel sounds are normal. There is no distension.     Palpations: Abdomen is soft. There is no mass.     Tenderness: There is no abdominal tenderness. There is no right CVA tenderness, left CVA tenderness, guarding or rebound.  Skin:    General: Skin is warm and dry.  Neurological:     General: No focal deficit present.     Mental Status: She is alert and oriented to person, place, and time.  Psychiatric:        Mood and Affect: Mood normal.        Behavior: Behavior normal.        Thought Content: Thought content normal.  Judgment: Judgment normal.     Assessment and Plan :   PDMP not reviewed this encounter.  1. Pelvic pain in female    No urinary symptoms and therefore deferred urinalysis and urine culture.  Patient declined UPT as she states there is no chance of pregnancy.  Had her cycle a week ago.  Will base treatment off of her vaginal cytology results.   Adolph Hoop, [place] 02/22/24 1942

## 2024-02-23 ENCOUNTER — Telehealth (HOSPITAL_COMMUNITY): Payer: Self-pay

## 2024-02-23 LAB — CERVICOVAGINAL ANCILLARY ONLY
Bacterial Vaginitis (gardnerella): POSITIVE — AB
Candida Glabrata: NEGATIVE
Candida Vaginitis: NEGATIVE
Chlamydia: NEGATIVE
Comment: NEGATIVE
Comment: NEGATIVE
Comment: NEGATIVE
Comment: NEGATIVE
Comment: NEGATIVE
Comment: NORMAL
Neisseria Gonorrhea: NEGATIVE
Trichomonas: NEGATIVE

## 2024-02-23 MED ORDER — METRONIDAZOLE 500 MG PO TABS: 500.0000 mg | ORAL_TABLET | Freq: Two times a day (BID) | ORAL | 0 refills | Status: AC

## 2024-02-23 NOTE — Telephone Encounter (Signed)
 Per protocol, pt requires tx with metronidazole. Rx sent to pharmacy on file.

## 2024-03-19 ENCOUNTER — Inpatient Hospital Stay (HOSPITAL_COMMUNITY)
Admission: AD | Admit: 2024-03-19 | Discharge: 2024-03-19 | Disposition: A | Discharge status: Home / Self Care | Attending: Family Medicine | Admitting: Family Medicine

## 2024-03-19 ENCOUNTER — Inpatient Hospital Stay (HOSPITAL_COMMUNITY)

## 2024-03-19 ENCOUNTER — Encounter (HOSPITAL_COMMUNITY): Payer: Self-pay | Admitting: *Deleted

## 2024-03-19 DIAGNOSIS — O26891 Other specified pregnancy related conditions, first trimester: Secondary | ICD-10-CM | POA: Insufficient documentation

## 2024-03-19 DIAGNOSIS — R109 Unspecified abdominal pain: Secondary | ICD-10-CM

## 2024-03-19 DIAGNOSIS — O209 Hemorrhage in early pregnancy, unspecified: Secondary | ICD-10-CM | POA: Diagnosis not present

## 2024-03-19 DIAGNOSIS — Z3A01 Less than 8 weeks gestation of pregnancy: Secondary | ICD-10-CM

## 2024-03-19 DIAGNOSIS — O26899 Other specified pregnancy related conditions, unspecified trimester: Secondary | ICD-10-CM

## 2024-03-19 LAB — WET PREP, GENITAL
Clue Cells Wet Prep HPF POC: NONE SEEN
Sperm: NONE SEEN
Trich, Wet Prep: NONE SEEN
WBC, Wet Prep HPF POC: >=10 — AB (ref <10)
Yeast Wet Prep HPF POC: NONE SEEN

## 2024-03-19 LAB — CBC
HCT: 41.6 % (ref 36.0–46.0)
Hemoglobin: 14.4 g/dL (ref 12.0–15.0)
MCH: 33 pg (ref 26.0–34.0)
MCHC: 34.6 g/dL (ref 30.0–36.0)
MCV: 95.2 fL (ref 80.0–100.0)
Platelets: 316 10*3/uL (ref 150–400)
RBC: 4.37 MIL/uL (ref 3.87–5.11)
RDW: 12 % (ref 11.5–15.5)
WBC: 8.7 10*3/uL (ref 4.0–10.5)
nRBC: 0 % (ref 0.0–0.2)

## 2024-03-19 LAB — URINALYSIS, ROUTINE W REFLEX MICROSCOPIC
Bilirubin Urine: NEGATIVE
Glucose, UA: NEGATIVE mg/dL
Hgb urine dipstick: NEGATIVE
Ketones, ur: 5 mg/dL — AB
Nitrite: NEGATIVE
Protein, ur: 30 mg/dL — AB
Specific Gravity, Urine: 1.026 (ref 1.005–1.030)
pH: 6 (ref 5.0–8.0)

## 2024-03-19 LAB — PREGNANCY, URINE: Preg Test, Ur: POSITIVE — AB

## 2024-03-19 LAB — HCG, QUANTITATIVE, PREGNANCY: hCG, Beta Chain, Quant, S: 30367 m[IU]/mL — ABNORMAL HIGH (ref <5)

## 2024-03-19 NOTE — MAU Provider Note (Signed)
 History  [name] is a [id number] presenting to MAU for abdominal and back pain that started a few weeks ago. She was treated for BV last month but the pain has not resolved.  She had a + UPT at home 2 days ago. She reports LMP 02/10/24 for 5 days. She missed her DMPA appointment but thought that she would still have some contraception coverage. She denies vaginal bleeding, discharge, or burning with urination.   CSN: [id number]  Arrival date and time: 03/19/24 5366   Event Date/Time   First Provider Initiated Contact with Patient 03/19/24 1230      Chief Complaint  Patient presents with   Back Pain   Pelvic Pain   Possible Pregnancy   Back Pain Associated symptoms include pelvic pain.  Pelvic Pain The patient's primary symptoms include pelvic pain. Associated symptoms include back pain.  Possible Pregnancy    OB History     Gravida  7   Para  3   Term  3   Preterm      AB  3   Living  3      SAB  2   IAB  1   Ectopic      Multiple  0   Live Births  3        Obstetric Comments  2021 ?Pre E 2023 IOL BP         Past Medical History:  Diagnosis Date   Chlamydia    Depression    Gonorrhea    Pregnancy induced hypertension     Past Surgical History:  Procedure Laterality Date   NO PAST SURGERIES      Family History  Problem Relation Age of Onset   Cancer Maternal Grandmother        pancreatic   Diabetes Maternal Uncle    Diabetes Mother    Hypertension Mother    Diabetes Father     Social History   Tobacco Use   Smoking status: Never   Smokeless tobacco: Never  Vaping Use   Vaping status: Never Used  Substance Use Topics   Alcohol use: Not Currently    Comment: weekly   Drug use: No    Allergies: No Known Allergies  Medications Prior to Admission  Medication Sig Dispense Refill Last Dose/Taking   sertraline  (ZOLOFT ) 50 MG tablet Take 1 tablet (50 mg total) by mouth daily. 30 tablet 11 03/18/2024   naproxen  (NAPROSYN ) 500 MG  tablet Take 1 tablet (500 mg total) by mouth 2 (two) times daily with a meal. 30 tablet 0     Review of Systems  Genitourinary:  Positive for pelvic pain.  Musculoskeletal:  Positive for back pain.   Physical Exam   Blood pressure 103/73, pulse 86, temperature 98.7 F (37.1 C), temperature source Oral, resp. rate 17, height 5\' 10"  (1.778 m), weight 80.2 kg, last menstrual period 02/10/2024, SpO2 100%, unknown if currently breastfeeding.  Physical Exam Constitutional:      Appearance: Normal appearance.  Cardiovascular:     Rate and Rhythm: Normal rate.  Pulmonary:     Effort: Pulmonary effort is normal.  Abdominal:     General: Abdomen is flat.     Palpations: Abdomen is soft.  Neurological:     Mental Status: She is alert and oriented to person, place, and time. Mental status is at baseline.  Psychiatric:        Mood and Affect: Mood normal.        Behavior:  Behavior normal.     MAU Course  Procedures Orders Placed This Encounter  Procedures   Wet prep, genital   Culture, OB Urine   US  OB LESS THAN 14 WEEKS WITH OB TRANSVAGINAL   Urinalysis, Routine w reflex microscopic -Urine, Clean Catch   Pregnancy, urine   CBC   hCG, quantitative, pregnancy   Diet NPO time specified   Discharge patient Discharge disposition: 01-Home or Self Care; Discharge patient date: 03/19/2024   Results for orders placed or performed during the hospital encounter of 03/19/24 (from the past 24 hours)  Urinalysis, Routine w reflex microscopic -Urine, Clean Catch     Status: Abnormal   Collection Time: 03/19/24 10:30 AM  Result Value Ref Range   Color, Urine AMBER (A) YELLOW   APPearance HAZY (A) CLEAR   Specific Gravity, Urine 1.026 1.005 - 1.030   pH 6.0 5.0 - 8.0   Glucose, UA NEGATIVE NEGATIVE mg/dL   Hgb urine dipstick NEGATIVE NEGATIVE   Bilirubin Urine NEGATIVE NEGATIVE   Ketones, ur 5 (A) NEGATIVE mg/dL   Protein, ur 30 (A) NEGATIVE mg/dL   Nitrite NEGATIVE NEGATIVE    Leukocytes,Ua SMALL (A) NEGATIVE   RBC / HPF 0-5 0 - 5 RBC/hpf   WBC, UA 0-5 0 - 5 WBC/hpf   Bacteria, UA FEW (A) NONE SEEN   Squamous Epithelial / HPF 0-5 0 - 5 /HPF   Mucus PRESENT   Wet prep, genital     Status: Abnormal   Collection Time: 03/19/24 10:44 AM  Result Value Ref Range   Yeast Wet Prep HPF POC NONE SEEN NONE SEEN   Trich, Wet Prep NONE SEEN NONE SEEN   Clue Cells Wet Prep HPF POC NONE SEEN NONE SEEN   WBC, Wet Prep HPF POC >=10 (A) <10   Sperm NONE SEEN   Pregnancy, urine     Status: Abnormal   Collection Time: 03/19/24 10:44 AM  Result Value Ref Range   Preg Test, Ur POSITIVE (A) NEGATIVE  CBC     Status: None   Collection Time: 03/19/24 11:49 AM  Result Value Ref Range   WBC 8.7 4.0 - 10.5 K/uL   RBC 4.37 3.87 - 5.11 MIL/uL   Hemoglobin 14.4 12.0 - 15.0 g/dL   HCT 16.1 09.6 - 04.5 %   MCV 95.2 80.0 - 100.0 fL   MCH 33.0 26.0 - 34.0 pg   MCHC 34.6 30.0 - 36.0 g/dL   RDW 40.9 81.1 - 91.4 %   Platelets 316 150 - 400 K/uL   nRBC 0.0 0.0 - 0.2 %    MDM  Reflex Urine Culture ordered to rule out possible UTI causing abdominal and back pain.    Assessment and Plan    1. Abdominal pain in early pregnancy   2. [redacted] weeks gestation of pregnancy    -This is an unplanned pregnancy, patient is unsure if she will continue or terminate. Resources provided for either option.  -Discussed LARCs such as IUD at the conclusion of this pregnancy to replace DMPA schedule. -Patient discharged to home in stable condition. May return to MAU for any other concerns related to pregnancy.  Wilford Hanks 03/19/2024, 12:47 PM

## 2024-03-19 NOTE — MAU Note (Signed)
 Terry [name] is a 28 y.o. at Unknown here in MAU reporting: a few wks ago had BV.  Was treated, but since then has been having pelvic and back pain. Has not taken anything for it, has not gotten worse.  LMP: ? 3/3, spotted a few days in April, can't remember dates, neg test at [facility] in April,  +UPT 2 days .  Documentation in [facility] has LMP 4/4 3 wks ago.  Reports last Depo was ? Feb Onset of complaint: few wks Pain score: back 5+, pelvic 6 Vitals:   03/19/24 1012  BP: 103/73  Pulse: 86  Resp: 17  Temp: 98.7 F (37.1 C)  SpO2: 100%      Lab orders placed from triage:  UA, UPT, vag swabs

## 2024-03-19 NOTE — Discharge Instructions (Signed)
 [facility] Address: [address] [place], [place], [place] [address] Hours:  Monday 9AM-5PM Tuesday 10AM-6PM Wednesday 11AM-7PM Thursday 9AM-5PM Friday              8AM-2PM Saturday Sunday  Phone: [phone]  [facility] Address: [address] [place], [place] [address] Phone: [phone]

## 2024-03-20 LAB — CULTURE, OB URINE

## 2024-03-20 LAB — GC/CHLAMYDIA PROBE AMP (~~LOC~~) NOT AT ARMC
Chlamydia: NEGATIVE
Comment: NEGATIVE
Comment: NORMAL
Neisseria Gonorrhea: NEGATIVE

## 2024-04-24 ENCOUNTER — Ambulatory Visit: Admitting: *Deleted

## 2024-04-24 ENCOUNTER — Other Ambulatory Visit (INDEPENDENT_AMBULATORY_CARE_PROVIDER_SITE_OTHER): Payer: Self-pay

## 2024-04-24 VITALS — BP 119/77 | HR 81 | Wt 188.2 lb

## 2024-04-24 DIAGNOSIS — O09899 Supervision of other high risk pregnancies, unspecified trimester: Secondary | ICD-10-CM

## 2024-04-24 DIAGNOSIS — Z1331 Encounter for screening for depression: Secondary | ICD-10-CM | POA: Diagnosis not present

## 2024-04-24 DIAGNOSIS — O09891 Supervision of other high risk pregnancies, first trimester: Secondary | ICD-10-CM | POA: Diagnosis not present

## 2024-04-24 DIAGNOSIS — Z3A10 10 weeks gestation of pregnancy: Secondary | ICD-10-CM

## 2024-04-24 DIAGNOSIS — Z362 Encounter for other antenatal screening follow-up: Secondary | ICD-10-CM

## 2024-04-24 NOTE — Patient Instructions (Signed)

## 2024-04-24 NOTE — Progress Notes (Addendum)
 New OB Intake  I connected with Terry [name]  on 04/24/24 at  3:10 PM EDT by in office Visit and verified that I am speaking with the correct person using two identifiers. Nurse is located at [facility] and pt is located at [address]  I discussed the limitations, risks, security and privacy concerns of performing an evaluation and management service by telephone and the availability of in person appointments. I also discussed with the patient that there may be a patient responsible charge related to this service. The patient expressed understanding and agreed to proceed.  I explained I am completing New OB Intake today. We discussed EDD of 11/16/2024, by Last Menstrual Period. Pt is Terry [name]. I reviewed her allergies, medications and Medical/Surgical/OB history.    Patient Active Problem List   Diagnosis Date Noted   IUD (intrauterine device) in place 04/19/2022   Vaginal delivery 04/18/2022   History of prior pregnancy with short cervix, currently pregnant 02/25/2020   Depression affecting pregnancy 01/29/2020   Supervision of other normal pregnancy, antepartum 01/18/2020     Concerns addressed today  Delivery Plans Plans to deliver at [facility]. Discussed the nature of our practice with multiple providers including residents and students. Due to the size of the practice, the delivering provider may not be the same as those providing prenatal care.   Patient is interested in water birth.  MyChart/Babyscripts MyChart access verified. I explained pt will have some visits in office and some virtually. Babyscripts instructions given and order placed. Patient verifies receipt of registration text/e-mail. Account successfully created and app downloaded. If patient is a candidate for Optimized scheduling, add to sticky note.   Blood Pressure Cuff/Weight Scale Blood pressure cuff ordered for patient to pick-up from [employer]. Explained after first prenatal appt pt  will check weekly and document in Babyscripts. Patient does have weight scale.  Anatomy US  Explained first scheduled US  will be around 19 weeks. Anatomy US  scheduled for TBD at TBD.  Is patient a CenteringPregnancy candidate?  Declined Declined due to Work schedule  Not a candidate due to na  Is patient a candidate for Babyscripts Optimization? No, due to Risk Factors   First visit review I reviewed new OB appt with patient. Explained pt will be seen by Dr. Racheal [name] at first visit. Discussed Terry [name] genetic screening with patient. Requests Panorama. Routine prenatal labs collected at today's visit.   Last Pap Diagnosis  Date Value Ref Range Status  03/11/2023   Final   - Negative for intraepithelial lesion or malignancy (NILM)    Terry [name] l Terry [name], CMA 04/24/2024  3:33 PM

## 2024-04-25 LAB — CBC/D/PLT+RPR+RH+ABO+RUBIGG...
Antibody Screen: NEGATIVE
Basophils Absolute: 0 10*3/uL (ref 0.0–0.2)
Basos: 0 %
EOS (ABSOLUTE): 0.1 10*3/uL (ref 0.0–0.4)
Eos: 1 %
HCV Ab: NONREACTIVE
HIV Screen 4th Generation wRfx: NONREACTIVE
Hematocrit: 40.9 % (ref 34.0–46.6)
Hemoglobin: 13.5 g/dL (ref 11.1–15.9)
Hepatitis B Surface Ag: NEGATIVE
Immature Grans (Abs): 0 10*3/uL (ref 0.0–0.1)
Immature Granulocytes: 0 %
Lymphocytes Absolute: 2.3 10*3/uL (ref 0.7–3.1)
Lymphs: 20 %
MCH: 33.1 pg — ABNORMAL HIGH (ref 26.6–33.0)
MCHC: 33 g/dL (ref 31.5–35.7)
MCV: 100 fL — ABNORMAL HIGH (ref 79–97)
Monocytes Absolute: 0.5 10*3/uL (ref 0.1–0.9)
Monocytes: 5 %
Neutrophils Absolute: 8.4 10*3/uL — ABNORMAL HIGH (ref 1.4–7.0)
Neutrophils: 74 %
Platelets: 297 10*3/uL (ref 150–450)
RBC: 4.08 x10E6/uL (ref 3.77–5.28)
RDW: 12.1 % (ref 11.7–15.4)
RPR Ser Ql: NONREACTIVE
Rh Factor: POSITIVE
Rubella Antibodies, IGG: 11.2 {index} (ref >0.99)
WBC: 11.3 10*3/uL — ABNORMAL HIGH (ref 3.4–10.8)

## 2024-04-25 LAB — HCV INTERPRETATION

## 2024-04-25 LAB — COMPREHENSIVE METABOLIC PANEL WITH GFR
ALT: 11 IU/L (ref 0–32)
AST: 17 IU/L (ref 0–40)
Albumin: 4 g/dL (ref 4.0–5.0)
Alkaline Phosphatase: 72 IU/L (ref 44–121)
BUN/Creatinine Ratio: 5 — ABNORMAL LOW (ref 9–23)
BUN: 3 mg/dL — ABNORMAL LOW (ref 6–20)
Bilirubin Total: 0.4 mg/dL (ref 0.0–1.2)
CO2: 21 mmol/L (ref 20–29)
Calcium: 9.2 mg/dL (ref 8.7–10.2)
Chloride: 101 mmol/L (ref 96–106)
Creatinine, Ser: 0.63 mg/dL (ref 0.57–1.00)
Globulin, Total: 2.6 g/dL (ref 1.5–4.5)
Glucose: 86 mg/dL (ref 70–99)
Potassium: 3.9 mmol/L (ref 3.5–5.2)
Sodium: 136 mmol/L (ref 134–144)
Total Protein: 6.6 g/dL (ref 6.0–8.5)
eGFR: 123 mL/min/{1.73_m2} (ref >59)

## 2024-04-25 LAB — HEMOGLOBIN A1C
Est. average glucose Bld gHb Est-mCnc: 85 mg/dL
Hgb A1c MFr Bld: 4.6 % — ABNORMAL LOW (ref 4.8–5.6)

## 2024-04-25 LAB — PROTEIN / CREATININE RATIO, URINE
Creatinine, Urine: 233.8 mg/dL
Protein, Ur: 17.9 mg/dL
Protein/Creat Ratio: 77 mg/g{creat} (ref 0–200)

## 2024-04-26 ENCOUNTER — Telehealth: Payer: Self-pay | Admitting: Obstetrics and Gynecology

## 2024-04-26 ENCOUNTER — Encounter: Payer: Self-pay | Admitting: Obstetrics

## 2024-04-26 LAB — CULTURE, OB URINE

## 2024-04-26 LAB — URINE CULTURE, OB REFLEX

## 2024-04-26 NOTE — Telephone Encounter (Signed)
 Called pt on 6/18 4:08 to let her know that the providers has not rec'd her lab results as of yet and once they do receive it someone will give her a call.  The office do ask for 3-5 business days for labs to be resulted and pt called.

## 2024-05-01 LAB — PANORAMA PRENATAL TEST FULL PANEL:PANORAMA TEST PLUS 5 ADDITIONAL MICRODELETIONS: FETAL FRACTION: 8.4

## 2024-05-10 ENCOUNTER — Encounter: Admitting: Obstetrics and Gynecology

## 2024-05-10 DIAGNOSIS — O09899 Supervision of other high risk pregnancies, unspecified trimester: Secondary | ICD-10-CM

## 2024-07-02 ENCOUNTER — Ambulatory Visit
Admission: RE | Admit: 2024-07-02 | Discharge: 2024-07-02 | Disposition: A | Discharge status: Home / Self Care | Source: Ambulatory Visit | Attending: Internal Medicine | Admitting: Internal Medicine

## 2024-07-02 VITALS — BP 138/98 | HR 75 | Temp 97.8°F | Resp 16

## 2024-07-02 DIAGNOSIS — N898 Other specified noninflammatory disorders of vagina: Secondary | ICD-10-CM | POA: Diagnosis present

## 2024-07-02 DIAGNOSIS — B379 Candidiasis, unspecified: Secondary | ICD-10-CM

## 2024-07-02 DIAGNOSIS — T3695XA Adverse effect of unspecified systemic antibiotic, initial encounter: Secondary | ICD-10-CM

## 2024-07-02 DIAGNOSIS — Z3202 Encounter for pregnancy test, result negative: Secondary | ICD-10-CM

## 2024-07-02 DIAGNOSIS — N760 Acute vaginitis: Secondary | ICD-10-CM | POA: Diagnosis not present

## 2024-07-02 DIAGNOSIS — B3731 Acute candidiasis of vulva and vagina: Secondary | ICD-10-CM | POA: Insufficient documentation

## 2024-07-02 LAB — POCT URINE PREGNANCY: Preg Test, Ur: NEGATIVE

## 2024-07-02 MED ORDER — METRONIDAZOLE 500 MG PO TABS: 500.0000 mg | ORAL_TABLET | Freq: Two times a day (BID) | ORAL | 0 refills | Status: DC

## 2024-07-02 MED ORDER — FLUCONAZOLE 150 MG PO TABS: 150.0000 mg | ORAL_TABLET | ORAL | 0 refills | Status: DC

## 2024-07-02 NOTE — Discharge Instructions (Addendum)
 Your evaluation suggests that you likely have bacterial vaginitis.  I have sent in treatment for yeast vaginitis as well due to your history of vaginal yeast infections with antibiotic use.  Take 1 pill of diflucan  today, then again in 7 days.   Your vaginal testing is pending and will come back in the next 2-3 days. You will receive a phone call from staff if you test positive for any other infections. We will go ahead and start treatment for BV. Take antibiotic as prescribed to treat this. If you were given flagyl  pills, do not drink alcohol during or for 48 hours after finishing antibiotic course.  Follow-up with OB/GYN or return to urgent care as needed.

## 2024-07-02 NOTE — ED Triage Notes (Signed)
 Pt c/o grayish-clear vaginal discharge, vaginal burning and odor for 2-3days.

## 2024-07-02 NOTE — ED Provider Notes (Signed)
 [facility]    CSN: [id number] Arrival date & time: 07/02/24  0818      History   Chief Complaint Chief Complaint  Patient presents with   Vaginal Discharge    I think I have a yeast or BV . Had a lot of discharge & odor smell. - Entered by patient    HPI Terry [name] is a 28 y.o. female.   Terry [name] is a 28 y.o. female presenting for chief complaint of gray/clear vaginal discharge with vaginal odor that started 2 or 3 days ago.  Denies vaginal itching, urinary symptoms, abdominal pain, pelvic pain, flank pain, fever, chills, nausea, vomiting, and known exposure to STD.  She is sexually active with female partner unprotected and is unsure of chance of pregnancy.  She was recently pregnant and had a surgical abortion.  She had a 2-day period of vaginal bleeding that she described as dark brown blood 3 weeks ago and is unsure if this was secondary to her surgical abortion versus her menstrual cycle.  History of BV, states this feels similar.  She tried using over-the-counter Monistat to rule out vaginal yeast infections but this made symptoms worse.   Vaginal Discharge   Past Medical History:  Diagnosis Date   Chlamydia    Depression    Gonorrhea    Pregnancy induced hypertension     Patient Active Problem List   Diagnosis Date Noted   Supervision of other high risk pregnancy, antepartum 04/24/2024   IUD (intrauterine device) in place 04/19/2022   History of prior pregnancy with short cervix, currently pregnant 02/25/2020   Depression affecting pregnancy 01/29/2020    Past Surgical History:  Procedure Laterality Date   NO PAST SURGERIES      OB History     Gravida  7   Para  3   Term  3   Preterm      AB  3   Living  3      SAB  2   IAB  1   Ectopic      Multiple  0   Live Births  3        Obstetric Comments  2021 ?Pre E 2023 IOL BP          Home Medications    Prior to Admission medications   Medication Sig  Start Date End Date Taking? Authorizing Provider  metroNIDAZOLE  (FLAGYL ) 500 MG tablet Take 1 tablet (500 mg total) by mouth 2 (two) times daily. 07/02/24  Yes Enedelia Dorna HERO, FNP  fluconazole  (DIFLUCAN ) 150 MG tablet Take 1 tablet (150 mg total) by mouth every 7 (seven) days. 07/02/24   Enedelia Dorna HERO, FNP  sertraline  (ZOLOFT ) 50 MG tablet Take 1 tablet (50 mg total) by mouth daily. 10/15/21   Ervin, Michael L, MD    Family History Family History  Problem Relation Age of Onset   Cancer Maternal Grandmother        pancreatic   Diabetes Maternal Uncle    Diabetes Mother    Hypertension Mother    Diabetes Father     Social History Social History   Tobacco Use   Smoking status: Never   Smokeless tobacco: Never  Vaping Use   Vaping status: Never Used  Substance Use Topics   Alcohol use: Not Currently    Comment: weekly   Drug use: No     Allergies   Patient has no known allergies.   Review of Systems Review of  Systems  Genitourinary:  Positive for vaginal discharge.  Per HPI   Physical Exam Triage Vital Signs ED Triage Vitals  Encounter Vitals Group     BP 07/02/24 0825 (!) 138/98     Girls Systolic BP Percentile --      Girls Diastolic BP Percentile --      Boys Systolic BP Percentile --      Boys Diastolic BP Percentile --      Pulse Rate 07/02/24 0825 75     Resp 07/02/24 0825 16     Temp 07/02/24 0825 97.8 F (36.6 C)     Temp Source 07/02/24 0825 Oral     SpO2 07/02/24 0825 97 %     Weight --      Height --      Head Circumference --      Peak Flow --      Pain Score 07/02/24 0827 0     Pain Loc --      Pain Education --      Exclude from Growth Chart --    No data found.  Updated Vital Signs BP (!) 138/98 (BP Location: Right Arm)   Pulse 75   Temp 97.8 F (36.6 C) (Oral)   Resp 16   LMP 02/10/2024 (Exact Date)   SpO2 97%   Breastfeeding Unknown   Visual Acuity Right Eye Distance:   Left Eye Distance:   Bilateral Distance:     Right Eye Near:   Left Eye Near:    Bilateral Near:     Physical Exam Vitals and nursing note reviewed.  Constitutional:      Appearance: She is not ill-appearing or toxic-appearing.  HENT:     Head: Normocephalic and atraumatic.     Right Ear: Hearing and external ear normal.     Left Ear: Hearing and external ear normal.     Nose: Nose normal.     Mouth/Throat:     Lips: Pink.  Eyes:     General: Lids are normal. Vision grossly intact. Gaze aligned appropriately.     Extraocular Movements: Extraocular movements intact.     Conjunctiva/sclera: Conjunctivae normal.  Pulmonary:     Effort: Pulmonary effort is normal.  Genitourinary:    Comments: Deferred.  Musculoskeletal:     Cervical back: Neck supple.  Skin:    General: Skin is warm and dry.     Capillary Refill: Capillary refill takes less than 2 seconds.     Findings: No rash.  Neurological:     General: No focal deficit present.     Mental Status: She is alert and oriented to person, place, and time. Mental status is at baseline.     Cranial Nerves: No dysarthria or facial asymmetry.  Psychiatric:        Mood and Affect: Mood normal.        Speech: Speech normal.        Behavior: Behavior normal.        Thought Content: Thought content normal.        Judgment: Judgment normal.      UC Treatments / Results  Labs (all labs ordered are listed, but only abnormal results are displayed) Labs Reviewed  POCT URINE PREGNANCY - Normal  CERVICOVAGINAL ANCILLARY ONLY    EKG   Radiology No results found.  Procedures Procedures (including critical care time)  Medications Ordered in UC Medications - No data to display  Initial Impression / Assessment and Plan / UC  Course  I have reviewed the triage vital signs and the nursing notes.  Pertinent labs & imaging results that were available during my care of the patient were reviewed by me and considered in my medical decision making (see chart for details).    1. Acute vaginitis, negative pregnancy test High clinical suspicion for bacterial vaginosis, therefore will treat with flagyl  empirically.  Discussed risks of disulfiram reaction, advised cessation of alcohol use during and for 72 hours after use of flagyl .  Diflucan  sent to treat for vaginal yeast infection associated with antibiotic use.  Vaginal swab pending, will treat for other positive infections per protocol when labs result. Discussed tips to prevent recurrent BV infections. Safe sex precautions discussed.   Urine pregnancy testing is negative.   Counseled patient on potential for adverse effects with medications prescribed/recommended today, strict ER and return-to-clinic precautions discussed, patient verbalized understanding.    Final Clinical Impressions(s) / UC Diagnoses   Final diagnoses:  Acute vaginitis  Negative pregnancy test  Antibiotic-induced yeast infection     Discharge Instructions      Your evaluation suggests that you likely have bacterial vaginitis.  I have sent in treatment for yeast vaginitis as well due to your history of vaginal yeast infections with antibiotic use.  Take 1 pill of diflucan  today, then again in 7 days.   Your vaginal testing is pending and will come back in the next 2-3 days. You will receive a phone call from staff if you test positive for any other infections. We will go ahead and start treatment for BV. Take antibiotic as prescribed to treat this. If you were given flagyl  pills, do not drink alcohol during or for 48 hours after finishing antibiotic course.  Follow-up with OB/GYN or return to urgent care as needed.      ED Prescriptions     Medication Sig Dispense Auth. Provider   metroNIDAZOLE  (FLAGYL ) 500 MG tablet Take 1 tablet (500 mg total) by mouth 2 (two) times daily. 14 tablet Enedelia Dorna HERO, FNP   fluconazole  (DIFLUCAN ) 150 MG tablet  (Status: Discontinued) Take 1 tablet (150 mg total) by mouth every 3 (three)  days. 2 tablet Enedelia Dorna HERO, FNP   fluconazole  (DIFLUCAN ) 150 MG tablet Take 1 tablet (150 mg total) by mouth every 7 (seven) days. 2 tablet Enedelia Dorna HERO, FNP      PDMP not reviewed this encounter.   Enedelia Dorna [place], [place] 07/02/24 [phone]

## 2024-07-03 LAB — CERVICOVAGINAL ANCILLARY ONLY
Bacterial Vaginitis (gardnerella): POSITIVE — AB
Candida Glabrata: NEGATIVE
Candida Vaginitis: NEGATIVE
Chlamydia: NEGATIVE
Comment: NEGATIVE
Comment: NEGATIVE
Comment: NEGATIVE
Comment: NEGATIVE
Comment: NEGATIVE
Comment: NORMAL
Neisseria Gonorrhea: NEGATIVE
Trichomonas: NEGATIVE

## 2024-07-05 ENCOUNTER — Ambulatory Visit (HOSPITAL_COMMUNITY): Payer: Self-pay

## 2024-09-24 ENCOUNTER — Ambulatory Visit
Admission: EM | Admit: 2024-09-24 | Discharge: 2024-09-24 | Disposition: A | Discharge status: Home / Self Care | Attending: Emergency Medicine | Admitting: Emergency Medicine

## 2024-09-24 DIAGNOSIS — Z113 Encounter for screening for infections with a predominantly sexual mode of transmission: Secondary | ICD-10-CM | POA: Diagnosis not present

## 2024-09-24 DIAGNOSIS — N760 Acute vaginitis: Secondary | ICD-10-CM | POA: Insufficient documentation

## 2024-09-24 DIAGNOSIS — B9689 Other specified bacterial agents as the cause of diseases classified elsewhere: Secondary | ICD-10-CM | POA: Insufficient documentation

## 2024-09-24 HISTORY — DX: Encounter for elective termination of pregnancy: Z33.2

## 2024-09-24 MED ORDER — METRONIDAZOLE 500 MG PO TABS: 500.0000 mg | ORAL_TABLET | Freq: Two times a day (BID) | ORAL | 0 refills | Status: AC

## 2024-09-24 MED ORDER — CLINDAMYCIN PHOSPHATE 2 % VA CREA: 1.0000 | TOPICAL_CREAM | Freq: Every day | VAGINAL | 0 refills | Status: AC

## 2024-09-24 MED ORDER — BORIC ACID VAGINAL 600 MG VA SUPP: 1.0000 | Freq: Every day | VAGINAL | 0 refills | Status: AC

## 2024-09-24 NOTE — Discharge Instructions (Addendum)
 We are trying clindamycin  gel this time.  I am also sending you home with Flagyl  just in case the clindamycin  gel is too expensive or does not work.  Boric acid vaginal suppositories to help prevent recurrence.  Condom use will prevent recurrence.  We will contact you if any of your labs come back abnormal we need to change management.  No intercourse until all labs have been resulted

## 2024-09-24 NOTE — ED Triage Notes (Signed)
 Patient states that she's had sx since last Wednesday. Patient states that this is a reoccurring thing since she had an abortion 3 months ago. Patient states that she didn't have this issue before then and havent been sexually active. Patient states that she has what seems like BV every week. Goes away when she has meds. Patient had a DNC and thinks they cleaned everything out.   Vaginal discharge with a smell.

## 2024-09-24 NOTE — ED Provider Notes (Signed)
 HPI  SUBJECTIVE:  Terry [name] is a 28 y.o. female who presents with vaginal odor and cloudy white discharge starting 5 days ago.  She has had recurrent BV since having termination of pregnancy with a D&C 2 months ago.  No fevers, nausea, vomiting, abdominal, pelvic pain, no vaginal itching, urinary complaints.  No vaginal bleeding, rash.  She has not been sexually active since the TOP.  She has been treated with Flagyl  and Diflucan  with improvement in her symptoms in the past.  She tried 4 days of Monistat this time without improvement in her symptoms.  No aggravating factors.  She has a past medical history of gonorrhea, chlamydia, BV and yeast.  No history of other STDs.   Past Medical History:  Diagnosis Date   Abortion in first trimester    Chlamydia    Depression    Gonorrhea    Pregnancy induced hypertension     Past Surgical History:  Procedure Laterality Date   NO PAST SURGERIES      Family History  Problem Relation Age of Onset   Cancer Maternal Grandmother        pancreatic   Diabetes Maternal Uncle    Diabetes Mother    Hypertension Mother    Diabetes Father     Social History   Tobacco Use   Smoking status: Never   Smokeless tobacco: Never  Vaping Use   Vaping status: Never Used  Substance Use Topics   Alcohol use: Not Currently    Comment: weekly   Drug use: No    No current facility-administered medications for this encounter.  Current Outpatient Medications:    Boric Acid Vaginal 600 MG SUPP, Place 1 suppository vaginally at bedtime., Disp: 30 suppository, Rfl: 0   clindamycin  (CLEOCIN ) 2 % vaginal cream, Place 1 Applicatorful vaginally at bedtime. X 7 days, Disp: 40 g, Rfl: 0   metroNIDAZOLE  (FLAGYL ) 500 MG tablet, Take 1 tablet (500 mg total) by mouth 2 (two) times daily for 7 days., Disp: 14 tablet, Rfl: 0   sertraline  (ZOLOFT ) 50 MG tablet, Take 1 tablet (50 mg total) by mouth daily., Disp: 30 tablet, Rfl: 11  No Known  Allergies   ROS  As noted in HPI.   Physical Exam  BP 120/89 (BP Location: Left Arm)   Pulse 98   Temp 98.5 F (36.9 C) (Oral)   Resp (!) 100   Wt 81.6 kg   LMP 09/03/2024   SpO2 100%   BMI 25.83 kg/m   Constitutional: Well developed, well nourished, no acute distress Eyes:  EOMI, conjunctiva normal bilaterally HENT: Normocephalic, atraumatic,mucus membranes moist Respiratory: Normal inspiratory effort Cardiovascular: Normal rate GI: nondistended soft, nontender. No suprapubic, flank tenderness  back: No CVA tenderness GU: Deferred skin: No rash, skin intact Musculoskeletal: no deformities Neurologic: Alert & oriented x 3, no focal neuro deficits Psychiatric: Speech and behavior appropriate   ED Course   Medications - No data to display  No orders of the defined types were placed in this encounter.   No results found for this or any previous visit (from the past 24 hours). No results found.  ED Clinical Impression  1. BV (bacterial vaginosis)   2. Screening for STDs (sexually transmitted diseases)    ED Assessment/Plan    H&P most c/w BV. Sent off swab for STDs, BV, yeast.  Checking for STDs as a baseline for when she becomes sexually active again. Will send home with clindamycin  gel to see if this eradicates  it, boric acid suppositories and a backup prescription for Flagyl  500 mg p.o. twice daily for 7 days.  Advised condom use in the future to prevent recurrent BV infections.  Pt provided working phone number. Follow-up with PCP. Discussed labs, MDM, plan and followup with patient. Pt agrees with plan.   Meds ordered this encounter  Medications   Boric Acid Vaginal 600 MG SUPP    Sig: Place 1 suppository vaginally at bedtime.    Dispense:  30 suppository    Refill:  0   clindamycin  (CLEOCIN ) 2 % vaginal cream    Sig: Place 1 Applicatorful vaginally at bedtime. X 7 days    Dispense:  40 g    Refill:  0   metroNIDAZOLE  (FLAGYL ) 500 MG tablet    Sig:  Take 1 tablet (500 mg total) by mouth 2 (two) times daily for 7 days.    Dispense:  14 tablet    Refill:  0    *This clinic note was created using Scientist, clinical (histocompatibility and immunogenetics). Therefore, there may be occasional mistakes despite careful proofreading.  ?     Van Knee, MD 09/24/24 [phone]

## 2024-09-25 LAB — CERVICOVAGINAL ANCILLARY ONLY
Bacterial Vaginitis (gardnerella): POSITIVE — AB
Candida Glabrata: NEGATIVE
Candida Vaginitis: NEGATIVE
Chlamydia: NEGATIVE
Comment: NEGATIVE
Comment: NEGATIVE
Comment: NEGATIVE
Comment: NEGATIVE
Comment: NEGATIVE
Comment: NORMAL
Neisseria Gonorrhea: NEGATIVE
Trichomonas: NEGATIVE

## 2024-09-26 ENCOUNTER — Ambulatory Visit (HOSPITAL_COMMUNITY): Payer: Self-pay
